# Patient Record
Sex: Male | Born: 1985 | Race: White | Hispanic: No | Marital: Single | Smoking: Never smoker
Health system: Southern US, Community
[De-identification: ages and names within clinical notes are randomized; demographics above are authoritative.]

## PROBLEM LIST (undated history)

## (undated) DIAGNOSIS — G839 Paralytic syndrome, unspecified: Secondary | ICD-10-CM

## (undated) DIAGNOSIS — Z982 Presence of cerebrospinal fluid drainage device: Secondary | ICD-10-CM

## (undated) DIAGNOSIS — R531 Weakness: Secondary | ICD-10-CM

## (undated) DIAGNOSIS — R29818 Other symptoms and signs involving the nervous system: Secondary | ICD-10-CM

## (undated) DIAGNOSIS — G935 Compression of brain: Secondary | ICD-10-CM

## (undated) DIAGNOSIS — R1310 Dysphagia, unspecified: Secondary | ICD-10-CM

## (undated) DIAGNOSIS — I10 Essential (primary) hypertension: Secondary | ICD-10-CM

## (undated) DIAGNOSIS — I6330 Cerebral infarction due to thrombosis of unspecified cerebral artery: Secondary | ICD-10-CM

## (undated) DIAGNOSIS — B181 Chronic viral hepatitis B without delta-agent: Secondary | ICD-10-CM

## (undated) DIAGNOSIS — S069X9A Unspecified intracranial injury with loss of consciousness of unspecified duration, initial encounter: Secondary | ICD-10-CM

## (undated) DIAGNOSIS — I639 Cerebral infarction, unspecified: Secondary | ICD-10-CM

## (undated) DIAGNOSIS — G8110 Spastic hemiplegia affecting unspecified side: Secondary | ICD-10-CM

## (undated) DIAGNOSIS — H539 Unspecified visual disturbance: Secondary | ICD-10-CM

## (undated) DIAGNOSIS — S069XAA Unspecified intracranial injury with loss of consciousness status unknown, initial encounter: Secondary | ICD-10-CM

## (undated) HISTORY — DX: Spastic hemiplegia affecting unspecified side: G81.10

## (undated) HISTORY — DX: Compression of brain: G93.5

## (undated) HISTORY — PX: OTHER SURGICAL HISTORY: SHX169

## (undated) HISTORY — DX: Presence of cerebrospinal fluid drainage device: Z98.2

## (undated) HISTORY — DX: Paralytic syndrome, unspecified: G83.9

## (undated) HISTORY — DX: Unspecified intracranial injury with loss of consciousness status unknown, initial encounter: S06.9XAA

## (undated) HISTORY — DX: Chronic viral hepatitis B without delta-agent: B18.1

## (undated) HISTORY — DX: Other symptoms and signs involving the nervous system: R29.818

## (undated) HISTORY — DX: Cerebral infarction, unspecified: I63.9

## (undated) HISTORY — DX: Dysphagia, unspecified: R13.10

## (undated) HISTORY — PX: VENTRICULOPERITONEAL SHUNT: SHX204

## (undated) HISTORY — DX: Essential (primary) hypertension: I10

## (undated) HISTORY — DX: Cerebral infarction due to thrombosis of unspecified cerebral artery: I63.30

## (undated) HISTORY — DX: Unspecified intracranial injury with loss of consciousness of unspecified duration, initial encounter: S06.9X9A

## (undated) HISTORY — PX: REMOVAL OF GASTROSTOMY TUBE: SHX6058

## (undated) HISTORY — DX: Weakness: R53.1

## (undated) HISTORY — PX: CRANIOPLASTY: SUR330

## (undated) HISTORY — DX: Unspecified visual disturbance: H53.9

---
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
## 2006-10-19 ENCOUNTER — Ambulatory Visit: Payer: Self-pay | Admitting: Internal Medicine

## 2006-11-02 ENCOUNTER — Ambulatory Visit: Payer: Self-pay | Admitting: Internal Medicine

## 2007-06-28 ENCOUNTER — Ambulatory Visit: Payer: Self-pay | Admitting: Internal Medicine

## 2007-06-28 DIAGNOSIS — L299 Pruritus, unspecified: Secondary | ICD-10-CM | POA: Insufficient documentation

## 2007-06-28 DIAGNOSIS — B181 Chronic viral hepatitis B without delta-agent: Secondary | ICD-10-CM | POA: Diagnosis present

## 2007-06-28 LAB — CONVERTED CEMR LAB
ALT: 41 units/L (ref 0–53)
AST: 38 units/L — ABNORMAL HIGH (ref 0–37)
Albumin: 4.1 g/dL (ref 3.5–5.2)
Total Protein: 7.5 g/dL (ref 6.0–8.3)

## 2007-07-05 ENCOUNTER — Telehealth (INDEPENDENT_AMBULATORY_CARE_PROVIDER_SITE_OTHER): Payer: Self-pay | Admitting: *Deleted

## 2007-08-18 ENCOUNTER — Ambulatory Visit: Payer: Self-pay | Admitting: Internal Medicine

## 2007-08-18 DIAGNOSIS — L240 Irritant contact dermatitis due to detergents: Secondary | ICD-10-CM

## 2007-08-18 LAB — CONVERTED CEMR LAB
Albumin: 4.1 g/dL (ref 3.5–5.2)
Alkaline Phosphatase: 50 units/L (ref 39–117)
Total Protein: 7.4 g/dL (ref 6.0–8.3)

## 2007-10-21 ENCOUNTER — Telehealth: Payer: Self-pay | Admitting: Internal Medicine

## 2007-10-27 ENCOUNTER — Ambulatory Visit: Payer: Self-pay | Admitting: Internal Medicine

## 2007-11-23 ENCOUNTER — Telehealth: Payer: Self-pay | Admitting: Internal Medicine

## 2007-12-08 ENCOUNTER — Ambulatory Visit: Payer: Self-pay | Admitting: Internal Medicine

## 2007-12-08 LAB — CONVERTED CEMR LAB
Alkaline Phosphatase: 47 units/L (ref 39–117)
Bilirubin, Direct: 0.1 mg/dL (ref 0.0–0.3)
Indirect Bilirubin: 0.3 mg/dL (ref 0.0–0.9)

## 2007-12-09 ENCOUNTER — Telehealth: Payer: Self-pay | Admitting: Internal Medicine

## 2008-03-09 ENCOUNTER — Telehealth: Payer: Self-pay | Admitting: Internal Medicine

## 2008-05-31 ENCOUNTER — Encounter: Payer: Self-pay | Admitting: Internal Medicine

## 2008-07-29 ENCOUNTER — Emergency Department (HOSPITAL_COMMUNITY): Admission: EM | Admit: 2008-07-29 | Discharge: 2008-07-29 | Payer: Self-pay | Admitting: *Deleted

## 2008-07-30 ENCOUNTER — Encounter: Payer: Self-pay | Admitting: Internal Medicine

## 2008-08-07 ENCOUNTER — Telehealth: Payer: Self-pay | Admitting: Internal Medicine

## 2008-08-09 ENCOUNTER — Ambulatory Visit: Payer: Self-pay | Admitting: Internal Medicine

## 2008-08-09 ENCOUNTER — Telehealth: Payer: Self-pay | Admitting: Internal Medicine

## 2008-08-09 DIAGNOSIS — K5289 Other specified noninfective gastroenteritis and colitis: Secondary | ICD-10-CM

## 2008-10-10 ENCOUNTER — Telehealth: Payer: Self-pay | Admitting: Internal Medicine

## 2008-10-10 ENCOUNTER — Ambulatory Visit: Payer: Self-pay | Admitting: Family Medicine

## 2008-10-25 ENCOUNTER — Ambulatory Visit: Payer: Self-pay | Admitting: Family Medicine

## 2008-10-25 DIAGNOSIS — J309 Allergic rhinitis, unspecified: Secondary | ICD-10-CM | POA: Insufficient documentation

## 2009-11-23 ENCOUNTER — Inpatient Hospital Stay (HOSPITAL_COMMUNITY): Admission: AC | Admit: 2009-11-23 | Discharge: 2009-12-13 | Payer: Self-pay

## 2009-12-10 ENCOUNTER — Ambulatory Visit: Payer: Self-pay | Admitting: Physical Medicine & Rehabilitation

## 2009-12-13 ENCOUNTER — Ambulatory Visit: Payer: Self-pay | Admitting: Physical Medicine & Rehabilitation

## 2009-12-13 ENCOUNTER — Inpatient Hospital Stay (HOSPITAL_COMMUNITY)
Admission: EM | Admit: 2009-12-13 | Discharge: 2010-01-10 | Payer: Self-pay | Admitting: Physical Medicine & Rehabilitation

## 2009-12-28 ENCOUNTER — Ambulatory Visit: Payer: Self-pay | Admitting: Physical Medicine & Rehabilitation

## 2010-01-07 ENCOUNTER — Ambulatory Visit: Payer: Self-pay | Admitting: Psychology

## 2010-01-10 ENCOUNTER — Inpatient Hospital Stay (HOSPITAL_COMMUNITY): Admission: RE | Admit: 2010-01-10 | Discharge: 2010-01-22 | Payer: Self-pay | Admitting: Neurosurgery

## 2010-02-17 DEATH — deceased

## 2010-06-30 ENCOUNTER — Telehealth: Payer: Self-pay | Admitting: Internal Medicine

## 2010-07-01 ENCOUNTER — Telehealth: Payer: Self-pay | Admitting: Internal Medicine

## 2010-07-02 ENCOUNTER — Encounter: Payer: Self-pay | Admitting: Internal Medicine

## 2010-07-02 ENCOUNTER — Telehealth: Payer: Self-pay | Admitting: Internal Medicine

## 2010-07-02 DIAGNOSIS — Z87820 Personal history of traumatic brain injury: Secondary | ICD-10-CM

## 2010-07-09 ENCOUNTER — Telehealth: Payer: Self-pay | Admitting: Internal Medicine

## 2010-07-15 ENCOUNTER — Telehealth: Payer: Self-pay | Admitting: Internal Medicine

## 2010-07-16 ENCOUNTER — Telehealth: Payer: Self-pay | Admitting: Internal Medicine

## 2010-07-22 ENCOUNTER — Encounter: Payer: Self-pay | Admitting: Internal Medicine

## 2010-07-23 ENCOUNTER — Encounter: Payer: Self-pay | Admitting: Internal Medicine

## 2010-07-28 ENCOUNTER — Ambulatory Visit
Admission: RE | Admit: 2010-07-28 | Discharge: 2010-07-28 | Payer: Self-pay | Source: Home / Self Care | Attending: Internal Medicine | Admitting: Internal Medicine

## 2010-07-28 DIAGNOSIS — G8190 Hemiplegia, unspecified affecting unspecified side: Secondary | ICD-10-CM | POA: Insufficient documentation

## 2010-07-28 DIAGNOSIS — I619 Nontraumatic intracerebral hemorrhage, unspecified: Secondary | ICD-10-CM | POA: Insufficient documentation

## 2010-07-28 DIAGNOSIS — G911 Obstructive hydrocephalus: Secondary | ICD-10-CM | POA: Insufficient documentation

## 2010-08-01 ENCOUNTER — Encounter: Payer: Self-pay | Admitting: Internal Medicine

## 2010-08-07 ENCOUNTER — Telehealth: Payer: Self-pay | Admitting: Internal Medicine

## 2010-08-10 ENCOUNTER — Encounter: Payer: Self-pay | Admitting: Physical Medicine & Rehabilitation

## 2010-08-11 ENCOUNTER — Encounter: Payer: Self-pay | Admitting: Internal Medicine

## 2010-08-15 ENCOUNTER — Encounter: Payer: Self-pay | Admitting: Internal Medicine

## 2010-08-21 NOTE — Progress Notes (Signed)
Summary: PT  Phone Note From Other Clinic   Summary of Call: PT needs extenstion of PT, please for two more weeks. [phone] Initial call taken by: Lynann Beaver CMA AAMA,  July 16, 2010 4:34 PM  Follow-up for Phone Call        may extend Follow-up by: Stacie Glaze MD,  July 18, 2010 2:04 PM  Additional Follow-up for Phone Call Additional follow up Details #1::        done Additional Follow-up by: [facility] CMA AAMA,  July 18, 2010 2:34 PM

## 2010-08-21 NOTE — Letter (Signed)
Summary: Statement of Medical Necessity / [facility]  Statement of Medical Necessity / [facility]   Imported By: Lennie Odor 07/08/2010 11:56:46  _____________________________________________________________________  External Attachment:    Type:   Image     Comment:   External Document

## 2010-08-21 NOTE — Progress Notes (Signed)
Summary: Call A Nurse  Phone Note Call from Patient   Summary of Call: called a nd spoke with kei th is am and she is going to check and make sure it was ordered Initial call taken by: Willy Eddy, LPN,  July 02, 2010 9:15 AM     Call-A-Nurse Triage Call Report Triage Record Num: [id number] Operator: Vernell Barrier Patient Name: Paul Kane Call Date & Time: 07/01/2010 6:40:43PM Patient Phone: [phone] PCP: Darryll Capers Patient Gender: Male PCP Fax : [phone] Patient DOB: 01-04-1986 Practice Name: [facility] Reason for Call: Sister calling regarding oral suction kit missing from order. Pt was d[facility] from [facility], Dr. Lovell Sheehan ordered [facility] to bring out tubal feeding supplies....oral suction kit missing from the order. [facility] called [phone] and oral suction kit ordered. Protocol(s) Used: Office Note Recommended Outcome per Protocol: Information Noted and Sent to Office Reason for Outcome: Caller information to office Care Advice:  ~ 07/01/2010 6:55:41PM Page 1 of 1 [id number]

## 2010-08-21 NOTE — Progress Notes (Signed)
Summary: REQUEST FOR RETURN CALL  Phone Note From Other Clinic   Caller: [facility] w/ [facility]  [phone] Summary of Call: Called to discuss patient.... advised she has been working with Rushie Goltz, LPN ref pt care.... Can be reached at  [phone].  Initial call taken by: Debbra Riding,  June 30, 2010 3:36 PM  Follow-up for Phone Call        ov for dec 20 for note Follow-up by: Willy Eddy, LPN,  July 01, 2010 10:54 AM     Appended Document: REQUEST FOR RETURN CALL follow up phone note was printed in error- please disregard- will call [facility] and find out wh at orders are needed

## 2010-08-21 NOTE — Progress Notes (Signed)
Summary: REQUEST FOR RETURN CALL  Phone Note Call from Patient   Caller: Patient   [phone] Complaint: Urinary/GYN Problems Summary of Call: Pts sister called to see if [facility] / Dr Lovell Sheehan had received any paperwork / correspondence from Dr Roby Lofts in [facility] in [place].Marland KitchenMarland KitchenMarland KitchenAdvised that they contacted [facility] and they advised the family that they have not received any info on pt.....  Pts family would like a return call to discuss same...Marland KitchenMarland Kitchen  [phone]. Initial call taken by: Debbra Riding,  July 01, 2010 4:17 PM  Follow-up for Phone Call        talked with sister- told her all orders have been given to [facility] and if she has any problems, to call kei at [facility] Follow-up by: Willy Eddy, LPN,  July 01, 2010 4:23 PM     Appended Document: Orders Update    Clinical Lists Changes  Problems: Added new problem of PERSONAL HISTORY OF TRAUMATIC BRAIN INJURY (ICD-V15.52) Orders: Added new Referral order of DME Referral (DME) - Signed

## 2010-08-21 NOTE — Medication Information (Signed)
Summary: Order for Suction Supplies/[facility]  Order for Suction Supplies/[facility]   Imported By: Maryln Gottron 07/30/2010 14:12:33  _____________________________________________________________________  External Attachment:    Type:   Image     Comment:   External Document

## 2010-08-21 NOTE — Progress Notes (Signed)
Summary: Pt req scripts for DocQlace and Senna  Phone Note Call from Patient Call back at [phone] [place] cell   Caller: father - Chinita Pester Summary of Call: Pts father came by and said that pt had gotten some meds at the [facility] in [place], [place] and the meds need to be refilled. Pt req refill of DocQlace Liquid  take 20ml (200mg ) by mouth two times a day qty 600 Doc-Q-Lace 150mg /23ml Liq Qua  also pt needs refill of Senna-Gen NF Tablet GLD #60 take 2 tabs by mouth every evening at 8pm.   [facility]s [facility] [address]. This has to be called in today asap. Pt out of med. Pls call father when this has been called in.  Initial call taken by: Lucy Antigua,  July 15, 2010 2:39 PM  Follow-up for Phone Call        call in one month supply of both  Follow-up by: Nelwyn Salisbury MD,  July 15, 2010 3:25 PM  Additional Follow-up for Phone Call Additional follow up Details #1::        father aware rx faxed to [facility] - also docqlace not availb in liquid - pill form ok per him. Change to med list done and rx sent KIK Additional Follow-up by: Duard Brady LPN,  July 15, 2010 3:56 PM    New/Updated Medications: SENNA-GEN 8.6 MG TABS (SENNOSIDES) 2 by mouth qhs DOCQLACE 100 MG CAPS (DOCUSATE SODIUM) 1 by mouth bid Prescriptions: DOCQLACE 100 MG CAPS (DOCUSATE SODIUM) 1 by mouth bid  #60 x 0   Entered by:   Duard Brady LPN   Authorized by:   Nelwyn Salisbury MD   Signed by:   Duard Brady LPN on 09/81/1914   Method used:   [facility] ...       [facility]  [phone]* (retail)       [address] [place], [place]  [address]       Ph: [phone] or [phone]       Fax: [phone]   RxID:   [id number] SENNA-GEN 8.6 MG TABS (SENNOSIDES) 2 by mouth qhs  #60 x 0   Entered by:   Duard Brady LPN   Authorized by:   Stacie Glaze MD   Signed by:   Duard Brady LPN on 56/43/3295   Method used:     RxID:   [id number]

## 2010-08-21 NOTE — Miscellaneous (Signed)
Summary: Physician's Orders/[facility]  Physician's Orders/[facility]   Imported By: Maryln Gottron 08/05/2010 09:20:45  _____________________________________________________________________  External Attachment:    Type:   Image     Comment:   External Document
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
## 2010-08-21 NOTE — Progress Notes (Signed)
  Phone Note Call from Patient   Caller: Dad Call For: Stacie Glaze MD Summary of Call: Family would like a referral to Resp. Therapy. [phone] Initial call taken by: [facility] CMA AAMA,  July 09, 2010 10:54 AM

## 2010-08-21 NOTE — Miscellaneous (Signed)
Summary: Certification and Plan of Care/[facility]  Certification and Plan of Care/[facility]   Imported By: Maryln Gottron 07/30/2010 14:15:43  _____________________________________________________________________  External Attachment:    Type:   Image     Comment:   External Document

## 2010-08-21 NOTE — Progress Notes (Addendum)
  Phone Note Call from Patient   Caller: Speech Therapist for [facility] Call For: Stacie Glaze MD Summary of Call: Asking how the referral for out paient referral is going.  They will be out of orders next week. [phone]  Boneta Lucks  Initial call taken by: Lynann Beaver CMA AAMA,  August 07, 2010 11:00 AM  Follow-up for Phone Call        will need to set up out pt speech therapy, ot, and pt - needs more equipment that home health can provide Follow-up by: Willy Eddy, LPN,  August 07, 2010 1:04 PM     Appended Document: Orders Update    Clinical Lists Changes  Orders: Added new Referral order of Rehabilitation Referral (Rehab) - Signed

## 2010-08-21 NOTE — Miscellaneous (Signed)
Summary: Physician's Orders/[facility]  Physician's Orders/[facility]   Imported By: Maryln Gottron 07/30/2010 14:13:58  _____________________________________________________________________  External Attachment:    Type:   Image     Comment:   External Document

## 2010-08-27 NOTE — Letter (Signed)
Summary: [facility]  [facility]   Imported By: Maryln Gottron 08/19/2010 15:55:43  _____________________________________________________________________  External Attachment:    Type:   Image     Comment:   External Document

## 2010-08-27 NOTE — Miscellaneous (Signed)
Summary: Physician's Orders/[facility]  Physician's Orders/[facility]   Imported By: Maryln Gottron 08/21/2010 12:36:21  _____________________________________________________________________  External Attachment:    Type:   Image     Comment:   External Document

## 2010-09-09 ENCOUNTER — Ambulatory Visit: Payer: BC Managed Care – PPO

## 2010-09-09 ENCOUNTER — Ambulatory Visit: Payer: BC Managed Care – PPO | Attending: Internal Medicine | Admitting: Physical Therapy

## 2010-09-09 DIAGNOSIS — R41842 Visuospatial deficit: Secondary | ICD-10-CM | POA: Insufficient documentation

## 2010-09-09 DIAGNOSIS — R4189 Other symptoms and signs involving cognitive functions and awareness: Secondary | ICD-10-CM | POA: Insufficient documentation

## 2010-09-09 DIAGNOSIS — R1313 Dysphagia, pharyngeal phase: Secondary | ICD-10-CM | POA: Insufficient documentation

## 2010-09-09 DIAGNOSIS — M25669 Stiffness of unspecified knee, not elsewhere classified: Secondary | ICD-10-CM | POA: Insufficient documentation

## 2010-09-09 DIAGNOSIS — R4701 Aphasia: Secondary | ICD-10-CM | POA: Insufficient documentation

## 2010-09-09 DIAGNOSIS — M2560 Stiffness of unspecified joint, not elsewhere classified: Secondary | ICD-10-CM | POA: Insufficient documentation

## 2010-09-09 DIAGNOSIS — M2420 Disorder of ligament, unspecified site: Secondary | ICD-10-CM | POA: Insufficient documentation

## 2010-09-09 DIAGNOSIS — M629 Disorder of muscle, unspecified: Secondary | ICD-10-CM | POA: Insufficient documentation

## 2010-09-09 DIAGNOSIS — Z5189 Encounter for other specified aftercare: Secondary | ICD-10-CM | POA: Insufficient documentation

## 2010-09-09 DIAGNOSIS — R1311 Dysphagia, oral phase: Secondary | ICD-10-CM | POA: Insufficient documentation

## 2010-09-09 DIAGNOSIS — R262 Difficulty in walking, not elsewhere classified: Secondary | ICD-10-CM | POA: Insufficient documentation

## 2010-09-09 DIAGNOSIS — M6281 Muscle weakness (generalized): Secondary | ICD-10-CM | POA: Insufficient documentation

## 2010-09-11 ENCOUNTER — Ambulatory Visit: Payer: Self-pay | Admitting: Physical Therapy

## 2010-09-11 ENCOUNTER — Ambulatory Visit: Payer: BC Managed Care – PPO | Admitting: Occupational Therapy

## 2010-09-11 ENCOUNTER — Ambulatory Visit: Payer: BC Managed Care – PPO

## 2010-09-16 ENCOUNTER — Encounter: Payer: BC Managed Care – PPO | Attending: Physical Medicine & Rehabilitation

## 2010-09-16 ENCOUNTER — Ambulatory Visit: Payer: BC Managed Care – PPO | Admitting: Physical Medicine & Rehabilitation

## 2010-09-16 DIAGNOSIS — S069XAS Unspecified intracranial injury with loss of consciousness status unknown, sequela: Secondary | ICD-10-CM | POA: Insufficient documentation

## 2010-09-16 DIAGNOSIS — Y831 Surgical operation with implant of artificial internal device as the cause of abnormal reaction of the patient, or of later complication, without mention of misadventure at the time of the procedure: Secondary | ICD-10-CM | POA: Insufficient documentation

## 2010-09-16 DIAGNOSIS — S069X9S Unspecified intracranial injury with loss of consciousness of unspecified duration, sequela: Secondary | ICD-10-CM | POA: Insufficient documentation

## 2010-09-16 DIAGNOSIS — T85695A Other mechanical complication of other nervous system device, implant or graft, initial encounter: Secondary | ICD-10-CM | POA: Insufficient documentation

## 2010-09-16 DIAGNOSIS — G911 Obstructive hydrocephalus: Secondary | ICD-10-CM | POA: Insufficient documentation

## 2010-09-16 DIAGNOSIS — R259 Unspecified abnormal involuntary movements: Secondary | ICD-10-CM | POA: Insufficient documentation

## 2010-09-16 DIAGNOSIS — I69998 Other sequelae following unspecified cerebrovascular disease: Secondary | ICD-10-CM | POA: Insufficient documentation

## 2010-09-16 DIAGNOSIS — Z79899 Other long term (current) drug therapy: Secondary | ICD-10-CM | POA: Insufficient documentation

## 2010-09-18 ENCOUNTER — Ambulatory Visit: Payer: BC Managed Care – PPO | Admitting: Occupational Therapy

## 2010-09-19 ENCOUNTER — Encounter: Payer: BC Managed Care – PPO | Admitting: Occupational Therapy

## 2010-09-19 ENCOUNTER — Encounter: Payer: Self-pay | Admitting: Internal Medicine

## 2010-09-22 ENCOUNTER — Ambulatory Visit: Payer: BC Managed Care – PPO | Admitting: Internal Medicine

## 2010-09-22 VITALS — BP 120/70 | HR 80 | Resp 14

## 2010-09-22 DIAGNOSIS — J189 Pneumonia, unspecified organism: Secondary | ICD-10-CM

## 2010-09-25 ENCOUNTER — Ambulatory Visit: Payer: BC Managed Care – PPO | Admitting: Physical Therapy

## 2010-09-25 ENCOUNTER — Encounter: Payer: BC Managed Care – PPO | Admitting: Occupational Therapy

## 2010-09-26 ENCOUNTER — Ambulatory Visit: Payer: BC Managed Care – PPO | Admitting: Physical Therapy

## 2010-09-26 ENCOUNTER — Encounter: Payer: BC Managed Care – PPO | Admitting: Occupational Therapy

## 2010-09-30 ENCOUNTER — Ambulatory Visit: Payer: BC Managed Care – PPO | Admitting: Physical Therapy

## 2010-09-30 ENCOUNTER — Encounter: Payer: BC Managed Care – PPO | Admitting: Occupational Therapy

## 2010-09-30 ENCOUNTER — Ambulatory Visit (HOSPITAL_COMMUNITY)
Admission: RE | Admit: 2010-09-30 | Discharge: 2010-09-30 | Disposition: A | Discharge status: Home / Self Care | Payer: BC Managed Care – PPO | Source: Ambulatory Visit | Attending: Internal Medicine | Admitting: Internal Medicine

## 2010-09-30 ENCOUNTER — Other Ambulatory Visit (INDEPENDENT_AMBULATORY_CARE_PROVIDER_SITE_OTHER): Payer: BC Managed Care – PPO | Admitting: Internal Medicine

## 2010-09-30 ENCOUNTER — Ambulatory Visit: Payer: Self-pay | Admitting: Internal Medicine

## 2010-09-30 DIAGNOSIS — Z09 Encounter for follow-up examination after completed treatment for conditions other than malignant neoplasm: Secondary | ICD-10-CM | POA: Insufficient documentation

## 2010-09-30 DIAGNOSIS — M899 Disorder of bone, unspecified: Secondary | ICD-10-CM | POA: Insufficient documentation

## 2010-09-30 DIAGNOSIS — J984 Other disorders of lung: Secondary | ICD-10-CM | POA: Insufficient documentation

## 2010-09-30 DIAGNOSIS — D649 Anemia, unspecified: Secondary | ICD-10-CM

## 2010-09-30 DIAGNOSIS — Z982 Presence of cerebrospinal fluid drainage device: Secondary | ICD-10-CM | POA: Insufficient documentation

## 2010-09-30 DIAGNOSIS — M949 Disorder of cartilage, unspecified: Secondary | ICD-10-CM | POA: Insufficient documentation

## 2010-09-30 DIAGNOSIS — J189 Pneumonia, unspecified organism: Secondary | ICD-10-CM

## 2010-09-30 LAB — CBC WITH DIFFERENTIAL/PLATELET
Basophils Relative: 0.3 % (ref 0.0–3.0)
Eosinophils Absolute: 0.1 10*3/uL (ref 0.0–0.7)
HCT: 38.2 % — ABNORMAL LOW (ref 39.0–52.0)
Lymphocytes Relative: 22.1 % (ref 12.0–46.0)
MCHC: 34.5 g/dL (ref 30.0–36.0)
Monocytes Absolute: 0.3 10*3/uL (ref 0.1–1.0)
Monocytes Relative: 5.4 % (ref 3.0–12.0)
Neutro Abs: 4.2 10*3/uL (ref 1.4–7.7)
Platelets: 171 10*3/uL (ref 150.0–400.0)
RBC: 4.18 Mil/uL — ABNORMAL LOW (ref 4.22–5.81)
RDW: 12.9 % (ref 11.5–14.6)
WBC: 6 10*3/uL (ref 4.5–10.5)

## 2010-10-01 ENCOUNTER — Ambulatory Visit: Payer: BC Managed Care – PPO | Admitting: Physical Therapy

## 2010-10-01 ENCOUNTER — Encounter: Payer: BC Managed Care – PPO | Admitting: Speech Pathology

## 2010-10-01 ENCOUNTER — Ambulatory Visit: Payer: BC Managed Care – PPO | Admitting: Rehabilitative and Restorative Service Providers"

## 2010-10-02 ENCOUNTER — Telehealth: Payer: Self-pay | Admitting: *Deleted

## 2010-10-02 DIAGNOSIS — J189 Pneumonia, unspecified organism: Secondary | ICD-10-CM

## 2010-10-02 MED ORDER — MOXIFLOXACIN HCL 400 MG PO TABS: 400.0000 mg | ORAL_TABLET | Freq: Every day | ORAL | Status: AC

## 2010-10-02 NOTE — Telephone Encounter (Signed)
Father called request xray a nd cbc results- resolving pneumonia per dr Lovell Sheehan- father informed- faxed xray and cbc to dr Willy Eddy at [facility]- also per dr Lovell Sheehan- start avelox 400 liquid qd for 10 days and referral pulmonary

## 2010-10-05 LAB — CBC
HCT: 38 % — ABNORMAL LOW (ref 39.0–52.0)
MCHC: 33.7 g/dL (ref 30.0–36.0)
MCV: 92.2 fL (ref 78.0–100.0)
RBC: 4.12 MIL/uL — ABNORMAL LOW (ref 4.22–5.81)

## 2010-10-05 LAB — MRSA PCR SCREENING: MRSA by PCR: NEGATIVE

## 2010-10-05 LAB — BASIC METABOLIC PANEL
BUN: 20 mg/dL (ref 6–23)
CO2: 30 mEq/L (ref 19–32)
Creatinine, Ser: 0.63 mg/dL (ref 0.4–1.5)

## 2010-10-05 LAB — GLUCOSE, CAPILLARY
Glucose-Capillary: 108 mg/dL — ABNORMAL HIGH (ref 70–99)
Glucose-Capillary: 113 mg/dL — ABNORMAL HIGH (ref 70–99)

## 2010-10-06 ENCOUNTER — Encounter: Payer: Self-pay | Admitting: Internal Medicine

## 2010-10-06 ENCOUNTER — Institutional Professional Consult (permissible substitution) (INDEPENDENT_AMBULATORY_CARE_PROVIDER_SITE_OTHER): Payer: BC Managed Care – PPO | Admitting: Internal Medicine

## 2010-10-06 ENCOUNTER — Encounter: Payer: BC Managed Care – PPO | Admitting: Occupational Therapy

## 2010-10-06 ENCOUNTER — Ambulatory Visit: Payer: BC Managed Care – PPO | Admitting: Physical Therapy

## 2010-10-06 DIAGNOSIS — J690 Pneumonitis due to inhalation of food and vomit: Secondary | ICD-10-CM | POA: Insufficient documentation

## 2010-10-06 LAB — BLOOD GAS, ARTERIAL
Acid-Base Excess: 0.7 mmol/L (ref 0.0–2.0)
Acid-Base Excess: 1.5 mmol/L (ref 0.0–2.0)
Acid-base deficit: 0.1 mmol/L (ref 0.0–2.0)
Bicarbonate: 24.6 mEq/L — ABNORMAL HIGH (ref 20.0–24.0)
Bicarbonate: 27.3 mEq/L — ABNORMAL HIGH (ref 20.0–24.0)
Drawn by: 317771
FIO2: 0.4 %
FIO2: 0.5 %
FIO2: 0.5 %
FIO2: 0.5 %
MECHVT: 500 mL
MECHVT: 500 mL
O2 Saturation: 94.4 %
PEEP: 5 cmH2O
Patient temperature: 98.6
Patient temperature: 98.6
RATE: 15 resp/min
RATE: 16 resp/min
TCO2: 25.7 mmol/L (ref 0–100)
TCO2: 26.9 mmol/L (ref 0–100)
TCO2: 28.5 mmol/L (ref 0–100)
pCO2 arterial: 36.3 mmHg (ref 35.0–45.0)
pCO2 arterial: 37.8 mmHg (ref 35.0–45.0)
pCO2 arterial: 40.5 mmHg (ref 35.0–45.0)
pCO2 arterial: 44.4 mmHg (ref 35.0–45.0)
pH, Arterial: 7.443 (ref 7.350–7.450)
pH, Arterial: 7.444 (ref 7.350–7.450)
pO2, Arterial: 103 mmHg — ABNORMAL HIGH (ref 80.0–100.0)
pO2, Arterial: 70.3 mmHg — ABNORMAL LOW (ref 80.0–100.0)
pO2, Arterial: 76.2 mmHg — ABNORMAL LOW (ref 80.0–100.0)
pO2, Arterial: 82.1 mmHg (ref 80.0–100.0)
pO2, Arterial: 94.2 mmHg (ref 80.0–100.0)

## 2010-10-06 LAB — COMPREHENSIVE METABOLIC PANEL
ALT: 91 U/L — ABNORMAL HIGH (ref 0–53)
ALT: 297 U/L — ABNORMAL HIGH (ref 0–53)
AST: 50 U/L — ABNORMAL HIGH (ref 0–37)
Albumin: 3.2 g/dL — ABNORMAL LOW (ref 3.5–5.2)
Alkaline Phosphatase: 67 U/L (ref 39–117)
Alkaline Phosphatase: 86 U/L (ref 39–117)
CO2: 29 mEq/L (ref 19–32)
CO2: 27 mEq/L (ref 19–32)
Calcium: 8.1 mg/dL — ABNORMAL LOW (ref 8.4–10.5)
Chloride: 103 mEq/L (ref 96–112)
GFR calc Af Amer: >60 mL/min (ref >60)
GFR calc non Af Amer: >60 mL/min (ref >60)
GFR calc non Af Amer: >60 mL/min (ref >60)
Glucose, Bld: 126 mg/dL — ABNORMAL HIGH (ref 70–99)
Potassium: 3.8 mEq/L (ref 3.5–5.1)
Sodium: 139 mEq/L (ref 135–145)
Sodium: 141 mEq/L (ref 135–145)
Total Bilirubin: 0.4 mg/dL (ref 0.3–1.2)

## 2010-10-06 LAB — GLUCOSE, CAPILLARY
Glucose-Capillary: 101 mg/dL — ABNORMAL HIGH (ref 70–99)
Glucose-Capillary: 110 mg/dL — ABNORMAL HIGH (ref 70–99)
Glucose-Capillary: 113 mg/dL — ABNORMAL HIGH (ref 70–99)
Glucose-Capillary: 117 mg/dL — ABNORMAL HIGH (ref 70–99)
Glucose-Capillary: 119 mg/dL — ABNORMAL HIGH (ref 70–99)
Glucose-Capillary: 128 mg/dL — ABNORMAL HIGH (ref 70–99)
Glucose-Capillary: 128 mg/dL — ABNORMAL HIGH (ref 70–99)
Glucose-Capillary: 129 mg/dL — ABNORMAL HIGH (ref 70–99)
Glucose-Capillary: 95 mg/dL (ref 70–99)
Glucose-Capillary: 108 mg/dL — ABNORMAL HIGH (ref 70–99)
Glucose-Capillary: 111 mg/dL — ABNORMAL HIGH (ref 70–99)
Glucose-Capillary: 117 mg/dL — ABNORMAL HIGH (ref 70–99)
Glucose-Capillary: 118 mg/dL — ABNORMAL HIGH (ref 70–99)
Glucose-Capillary: 124 mg/dL — ABNORMAL HIGH (ref 70–99)
Glucose-Capillary: 126 mg/dL — ABNORMAL HIGH (ref 70–99)
Glucose-Capillary: 102 mg/dL — ABNORMAL HIGH (ref 70–99)
Glucose-Capillary: 102 mg/dL — ABNORMAL HIGH (ref 70–99)
Glucose-Capillary: 111 mg/dL — ABNORMAL HIGH (ref 70–99)
Glucose-Capillary: 112 mg/dL — ABNORMAL HIGH (ref 70–99)
Glucose-Capillary: 113 mg/dL — ABNORMAL HIGH (ref 70–99)
Glucose-Capillary: 114 mg/dL — ABNORMAL HIGH (ref 70–99)
Glucose-Capillary: 116 mg/dL — ABNORMAL HIGH (ref 70–99)
Glucose-Capillary: 117 mg/dL — ABNORMAL HIGH (ref 70–99)
Glucose-Capillary: 121 mg/dL — ABNORMAL HIGH (ref 70–99)
Glucose-Capillary: 122 mg/dL — ABNORMAL HIGH (ref 70–99)
Glucose-Capillary: 123 mg/dL — ABNORMAL HIGH (ref 70–99)
Glucose-Capillary: 124 mg/dL — ABNORMAL HIGH (ref 70–99)
Glucose-Capillary: 127 mg/dL — ABNORMAL HIGH (ref 70–99)
Glucose-Capillary: 128 mg/dL — ABNORMAL HIGH (ref 70–99)
Glucose-Capillary: 129 mg/dL — ABNORMAL HIGH (ref 70–99)
Glucose-Capillary: 129 mg/dL — ABNORMAL HIGH (ref 70–99)
Glucose-Capillary: 130 mg/dL — ABNORMAL HIGH (ref 70–99)
Glucose-Capillary: 130 mg/dL — ABNORMAL HIGH (ref 70–99)
Glucose-Capillary: 133 mg/dL — ABNORMAL HIGH (ref 70–99)
Glucose-Capillary: 134 mg/dL — ABNORMAL HIGH (ref 70–99)
Glucose-Capillary: 134 mg/dL — ABNORMAL HIGH (ref 70–99)
Glucose-Capillary: 135 mg/dL — ABNORMAL HIGH (ref 70–99)
Glucose-Capillary: 135 mg/dL — ABNORMAL HIGH (ref 70–99)
Glucose-Capillary: 136 mg/dL — ABNORMAL HIGH (ref 70–99)
Glucose-Capillary: 136 mg/dL — ABNORMAL HIGH (ref 70–99)
Glucose-Capillary: 138 mg/dL — ABNORMAL HIGH (ref 70–99)
Glucose-Capillary: 140 mg/dL — ABNORMAL HIGH (ref 70–99)
Glucose-Capillary: 140 mg/dL — ABNORMAL HIGH (ref 70–99)
Glucose-Capillary: 140 mg/dL — ABNORMAL HIGH (ref 70–99)
Glucose-Capillary: 141 mg/dL — ABNORMAL HIGH (ref 70–99)
Glucose-Capillary: 141 mg/dL — ABNORMAL HIGH (ref 70–99)
Glucose-Capillary: 143 mg/dL — ABNORMAL HIGH (ref 70–99)
Glucose-Capillary: 145 mg/dL — ABNORMAL HIGH (ref 70–99)
Glucose-Capillary: 146 mg/dL — ABNORMAL HIGH (ref 70–99)
Glucose-Capillary: 147 mg/dL — ABNORMAL HIGH (ref 70–99)
Glucose-Capillary: 148 mg/dL — ABNORMAL HIGH (ref 70–99)
Glucose-Capillary: 151 mg/dL — ABNORMAL HIGH (ref 70–99)
Glucose-Capillary: 166 mg/dL — ABNORMAL HIGH (ref 70–99)
Glucose-Capillary: 96 mg/dL (ref 70–99)
Glucose-Capillary: 99 mg/dL (ref 70–99)

## 2010-10-06 LAB — URINALYSIS, ROUTINE W REFLEX MICROSCOPIC
Glucose, UA: NEGATIVE mg/dL
Hgb urine dipstick: NEGATIVE
Ketones, ur: NEGATIVE mg/dL
Ketones, ur: NEGATIVE mg/dL
Leukocytes, UA: NEGATIVE
Nitrite: NEGATIVE
Protein, ur: 30 mg/dL — AB
Protein, ur: NEGATIVE mg/dL
Urobilinogen, UA: 1 mg/dL (ref 0.0–1.0)
pH: 5.5 (ref 5.0–8.0)

## 2010-10-06 LAB — BASIC METABOLIC PANEL
BUN: 10 mg/dL (ref 6–23)
BUN: 11 mg/dL (ref 6–23)
BUN: 11 mg/dL (ref 6–23)
BUN: 11 mg/dL (ref 6–23)
BUN: 11 mg/dL (ref 6–23)
BUN: 13 mg/dL (ref 6–23)
BUN: 13 mg/dL (ref 6–23)
BUN: 14 mg/dL (ref 6–23)
BUN: 16 mg/dL (ref 6–23)
BUN: 16 mg/dL (ref 6–23)
BUN: 17 mg/dL (ref 6–23)
BUN: 20 mg/dL (ref 6–23)
BUN: 9 mg/dL (ref 6–23)
CO2: 25 mEq/L (ref 19–32)
CO2: 25 mEq/L (ref 19–32)
CO2: 25 mEq/L (ref 19–32)
CO2: 27 mEq/L (ref 19–32)
CO2: 27 mEq/L (ref 19–32)
CO2: 27 mEq/L (ref 19–32)
CO2: 28 mEq/L (ref 19–32)
CO2: 28 mEq/L (ref 19–32)
Calcium: 9.5 mg/dL (ref 8.4–10.5)
Calcium: 7.7 mg/dL — ABNORMAL LOW (ref 8.4–10.5)
Calcium: 7.9 mg/dL — ABNORMAL LOW (ref 8.4–10.5)
Calcium: 8.1 mg/dL — ABNORMAL LOW (ref 8.4–10.5)
Calcium: 8.2 mg/dL — ABNORMAL LOW (ref 8.4–10.5)
Calcium: 8.2 mg/dL — ABNORMAL LOW (ref 8.4–10.5)
Calcium: 8.3 mg/dL — ABNORMAL LOW (ref 8.4–10.5)
Calcium: 8.3 mg/dL — ABNORMAL LOW (ref 8.4–10.5)
Calcium: 8.4 mg/dL (ref 8.4–10.5)
Calcium: 8.4 mg/dL (ref 8.4–10.5)
Calcium: 8.4 mg/dL (ref 8.4–10.5)
Calcium: 9 mg/dL (ref 8.4–10.5)
Calcium: 9.2 mg/dL (ref 8.4–10.5)
Chloride: 101 mEq/L (ref 96–112)
Chloride: 101 meq/L (ref 96–112)
Chloride: 102 mEq/L (ref 96–112)
Chloride: 102 mEq/L (ref 96–112)
Chloride: 103 mEq/L (ref 96–112)
Chloride: 106 mEq/L (ref 96–112)
Chloride: 95 mEq/L — ABNORMAL LOW (ref 96–112)
Chloride: 97 mEq/L (ref 96–112)
Chloride: 97 mEq/L (ref 96–112)
Chloride: 98 mEq/L (ref 96–112)
Creatinine, Ser: 0.42 mg/dL (ref 0.4–1.5)
Creatinine, Ser: 0.42 mg/dL (ref 0.4–1.5)
Creatinine, Ser: 0.45 mg/dL (ref 0.4–1.5)
Creatinine, Ser: 0.45 mg/dL (ref 0.4–1.5)
Creatinine, Ser: 0.45 mg/dL (ref 0.4–1.5)
Creatinine, Ser: 0.47 mg/dL (ref 0.4–1.5)
Creatinine, Ser: 0.48 mg/dL (ref 0.4–1.5)
Creatinine, Ser: 0.48 mg/dL (ref 0.4–1.5)
Creatinine, Ser: 0.5 mg/dL (ref 0.4–1.5)
Creatinine, Ser: 0.5 mg/dL (ref 0.4–1.5)
Creatinine, Ser: 0.52 mg/dL (ref 0.4–1.5)
Creatinine, Ser: 0.53 mg/dL (ref 0.4–1.5)
Creatinine, Ser: 0.53 mg/dL (ref 0.4–1.5)
Creatinine, Ser: 0.54 mg/dL (ref 0.4–1.5)
Creatinine, Ser: 0.55 mg/dL (ref 0.4–1.5)
Creatinine, Ser: 0.59 mg/dL (ref 0.4–1.5)
Creatinine, Ser: 0.62 mg/dL (ref 0.4–1.5)
GFR calc Af Amer: >60 mL/min (ref >60)
GFR calc Af Amer: >60 mL/min (ref >60)
GFR calc Af Amer: >60 mL/min (ref >60)
GFR calc Af Amer: >60 mL/min (ref >60)
GFR calc Af Amer: >60 mL/min (ref >60)
GFR calc Af Amer: >60 mL/min (ref >60)
GFR calc Af Amer: >60 mL/min (ref >60)
GFR calc Af Amer: >60 mL/min (ref >60)
GFR calc Af Amer: >60 mL/min (ref >60)
GFR calc Af Amer: >60 mL/min (ref >60)
GFR calc Af Amer: >60 mL/min (ref >60)
GFR calc non Af Amer: >60 mL/min (ref >60)
GFR calc non Af Amer: >60 mL/min (ref >60)
GFR calc non Af Amer: >60 mL/min (ref >60)
GFR calc non Af Amer: >60 mL/min (ref >60)
GFR calc non Af Amer: >60 mL/min (ref >60)
GFR calc non Af Amer: >60 mL/min (ref >60)
GFR calc non Af Amer: >60 mL/min (ref >60)
GFR calc non Af Amer: >60 mL/min (ref >60)
GFR calc non Af Amer: >60 mL/min (ref >60)
GFR calc non Af Amer: >60 mL/min (ref >60)
GFR calc non Af Amer: >60 mL/min (ref >60)
GFR calc non Af Amer: >60 mL/min (ref >60)
GFR calc non Af Amer: >60 mL/min (ref >60)
GFR calc non Af Amer: >60 mL/min (ref >60)
Glucose, Bld: 118 mg/dL — ABNORMAL HIGH (ref 70–99)
Glucose, Bld: 123 mg/dL — ABNORMAL HIGH (ref 70–99)
Glucose, Bld: 124 mg/dL — ABNORMAL HIGH (ref 70–99)
Glucose, Bld: 125 mg/dL — ABNORMAL HIGH (ref 70–99)
Glucose, Bld: 136 mg/dL — ABNORMAL HIGH (ref 70–99)
Glucose, Bld: 138 mg/dL — ABNORMAL HIGH (ref 70–99)
Glucose, Bld: 141 mg/dL — ABNORMAL HIGH (ref 70–99)
Glucose, Bld: 144 mg/dL — ABNORMAL HIGH (ref 70–99)
Glucose, Bld: 145 mg/dL — ABNORMAL HIGH (ref 70–99)
Glucose, Bld: 146 mg/dL — ABNORMAL HIGH (ref 70–99)
Glucose, Bld: 149 mg/dL — ABNORMAL HIGH (ref 70–99)
Potassium: 3.4 meq/L — ABNORMAL LOW (ref 3.5–5.1)
Potassium: 3.6 mEq/L (ref 3.5–5.1)
Potassium: 3.7 mEq/L (ref 3.5–5.1)
Potassium: 3.8 mEq/L (ref 3.5–5.1)
Potassium: 3.9 mEq/L (ref 3.5–5.1)
Potassium: 4 mEq/L (ref 3.5–5.1)
Potassium: 4 mEq/L (ref 3.5–5.1)
Sodium: 138 mEq/L (ref 135–145)
Sodium: 129 mEq/L — ABNORMAL LOW (ref 135–145)
Sodium: 130 mEq/L — ABNORMAL LOW (ref 135–145)
Sodium: 131 mEq/L — ABNORMAL LOW (ref 135–145)
Sodium: 132 mEq/L — ABNORMAL LOW (ref 135–145)
Sodium: 133 mEq/L — ABNORMAL LOW (ref 135–145)
Sodium: 134 mEq/L — ABNORMAL LOW (ref 135–145)
Sodium: 134 mEq/L — ABNORMAL LOW (ref 135–145)
Sodium: 136 meq/L (ref 135–145)
Sodium: 137 mEq/L (ref 135–145)
Sodium: 138 mEq/L (ref 135–145)
Sodium: 139 mEq/L (ref 135–145)

## 2010-10-06 LAB — DIFFERENTIAL
Basophils Absolute: 0 10*3/uL (ref 0.0–0.1)
Basophils Absolute: 0.1 10*3/uL (ref 0.0–0.1)
Basophils Relative: 0 % (ref 0–1)
Basophils Relative: 0 % (ref 0–1)
Eosinophils Absolute: 0.3 10*3/uL (ref 0.0–0.7)
Eosinophils Absolute: 0.2 10*3/uL (ref 0.0–0.7)
Eosinophils Absolute: 0.3 10*3/uL (ref 0.0–0.7)
Eosinophils Relative: 2 % (ref 0–5)
Eosinophils Relative: 1 % (ref 0–5)
Lymphocytes Relative: 12 % (ref 12–46)
Lymphocytes Relative: 10 % — ABNORMAL LOW (ref 12–46)
Lymphs Abs: 1.3 10*3/uL (ref 0.7–4.0)
Monocytes Absolute: 1 10*3/uL (ref 0.1–1.0)
Monocytes Absolute: 1 10*3/uL (ref 0.1–1.0)
Monocytes Relative: 6 % (ref 3–12)
Neutrophils Relative %: 85 % — ABNORMAL HIGH (ref 43–77)

## 2010-10-06 LAB — CBC
HCT: 20.5 % — ABNORMAL LOW (ref 39.0–52.0)
HCT: 21.9 % — ABNORMAL LOW (ref 39.0–52.0)
HCT: 22.4 % — ABNORMAL LOW (ref 39.0–52.0)
HCT: 24.2 % — ABNORMAL LOW (ref 39.0–52.0)
HCT: 24.7 % — ABNORMAL LOW (ref 39.0–52.0)
HCT: 25 % — ABNORMAL LOW (ref 39.0–52.0)
HCT: 25.1 % — ABNORMAL LOW (ref 39.0–52.0)
Hemoglobin: 11.1 g/dL — ABNORMAL LOW (ref 13.0–17.0)
Hemoglobin: 7.2 g/dL — ABNORMAL LOW (ref 13.0–17.0)
Hemoglobin: 7.7 g/dL — ABNORMAL LOW (ref 13.0–17.0)
Hemoglobin: 8.7 g/dL — ABNORMAL LOW (ref 13.0–17.0)
Hemoglobin: 8.7 g/dL — ABNORMAL LOW (ref 13.0–17.0)
Hemoglobin: 9.1 g/dL — ABNORMAL LOW (ref 13.0–17.0)
MCHC: 33.7 g/dL (ref 30.0–36.0)
MCHC: 34 g/dL (ref 30.0–36.0)
MCHC: 34.2 g/dL (ref 30.0–36.0)
MCHC: 34.4 g/dL (ref 30.0–36.0)
MCHC: 34.5 g/dL (ref 30.0–36.0)
MCHC: 34.9 g/dL (ref 30.0–36.0)
MCHC: 35.2 g/dL (ref 30.0–36.0)
MCV: 94 fL (ref 78.0–100.0)
MCV: 91.6 fL (ref 78.0–100.0)
MCV: 91.7 fL (ref 78.0–100.0)
MCV: 92.8 fL (ref 78.0–100.0)
MCV: 93.1 fL (ref 78.0–100.0)
MCV: 93.7 fL (ref 78.0–100.0)
MCV: 94 fL (ref 78.0–100.0)
Platelets: 307 10*3/uL (ref 150–400)
Platelets: 419 10*3/uL — ABNORMAL HIGH (ref 150–400)
Platelets: 478 10*3/uL — ABNORMAL HIGH (ref 150–400)
Platelets: 527 10*3/uL — ABNORMAL HIGH (ref 150–400)
Platelets: 565 10*3/uL — ABNORMAL HIGH (ref 150–400)
Platelets: 606 10*3/uL — ABNORMAL HIGH (ref 150–400)
Platelets: 615 10*3/uL — ABNORMAL HIGH (ref 150–400)
Platelets: 650 10*3/uL — ABNORMAL HIGH (ref 150–400)
RBC: 3.55 MIL/uL — ABNORMAL LOW (ref 4.22–5.81)
RBC: 3.28 MIL/uL — ABNORMAL LOW (ref 4.22–5.81)
RBC: 2.23 MIL/uL — ABNORMAL LOW (ref 4.22–5.81)
RBC: 2.7 MIL/uL — ABNORMAL LOW (ref 4.22–5.81)
RBC: 2.74 MIL/uL — ABNORMAL LOW (ref 4.22–5.81)
RBC: 2.92 MIL/uL — ABNORMAL LOW (ref 4.22–5.81)
RDW: 12.7 % (ref 11.5–15.5)
RDW: 13.5 % (ref 11.5–15.5)
RDW: 13.9 % (ref 11.5–15.5)
RDW: 14.3 % (ref 11.5–15.5)
RDW: 14.4 % (ref 11.5–15.5)
RDW: 14.6 % (ref 11.5–15.5)
WBC: 12.5 10*3/uL — ABNORMAL HIGH (ref 4.0–10.5)
WBC: 12.7 10*3/uL — ABNORMAL HIGH (ref 4.0–10.5)
WBC: 16.4 10*3/uL — ABNORMAL HIGH (ref 4.0–10.5)
WBC: 16.8 10*3/uL — ABNORMAL HIGH (ref 4.0–10.5)
WBC: 17.5 10*3/uL — ABNORMAL HIGH (ref 4.0–10.5)
WBC: 25.3 10*3/uL — ABNORMAL HIGH (ref 4.0–10.5)

## 2010-10-06 LAB — OSMOLALITY
Osmolality: 265 mOsm/kg — ABNORMAL LOW (ref 275–300)
Osmolality: 272 mOsm/kg — ABNORMAL LOW (ref 275–300)
Osmolality: 273 mOsm/kg — ABNORMAL LOW (ref 275–300)
Osmolality: 274 mOsm/kg — ABNORMAL LOW (ref 275–300)
Osmolality: 279 mOsm/kg (ref 275–300)
Osmolality: 279 mOsm/kg (ref 275–300)
Osmolality: 281 mOsm/kg (ref 275–300)
Osmolality: 282 mOsm/kg (ref 275–300)
Osmolality: 284 mOsm/kg (ref 275–300)
Osmolality: 285 mOsm/kg (ref 275–300)
Osmolality: 287 mOsm/kg (ref 275–300)
Osmolality: 289 mOsm/kg (ref 275–300)
Osmolality: 295 mOsm/kg (ref 275–300)

## 2010-10-06 LAB — CULTURE, BAL-QUANTITATIVE W GRAM STAIN: Colony Count: 30000

## 2010-10-06 LAB — URINE CULTURE
Culture: NO GROWTH
Culture: NO GROWTH

## 2010-10-06 LAB — VANCOMYCIN, TROUGH: Vancomycin Tr: <5 ug/mL — ABNORMAL LOW (ref 10.0–20.0)

## 2010-10-06 LAB — CULTURE, BLOOD (ROUTINE X 2): Culture: NO GROWTH

## 2010-10-07 LAB — CBC
HCT: 24.4 % — ABNORMAL LOW (ref 39.0–52.0)
HCT: 25.4 % — ABNORMAL LOW (ref 39.0–52.0)
HCT: 30.9 % — ABNORMAL LOW (ref 39.0–52.0)
HCT: 32.8 % — ABNORMAL LOW (ref 39.0–52.0)
HCT: 37.2 % — ABNORMAL LOW (ref 39.0–52.0)
HCT: 40.2 % (ref 39.0–52.0)
Hemoglobin: 11.1 g/dL — ABNORMAL LOW (ref 13.0–17.0)
Hemoglobin: 11.7 g/dL — ABNORMAL LOW (ref 13.0–17.0)
Hemoglobin: 12.9 g/dL — ABNORMAL LOW (ref 13.0–17.0)
Hemoglobin: 8.4 g/dL — ABNORMAL LOW (ref 13.0–17.0)
Hemoglobin: 9.3 g/dL — ABNORMAL LOW (ref 13.0–17.0)
MCHC: 34.6 g/dL (ref 30.0–36.0)
MCHC: 34.9 g/dL (ref 30.0–36.0)
MCHC: 35.5 g/dL (ref 30.0–36.0)
MCV: 92.4 fL (ref 78.0–100.0)
MCV: 92.9 fL (ref 78.0–100.0)
MCV: 92.9 fL (ref 78.0–100.0)
Platelets: 131 10*3/uL — ABNORMAL LOW (ref 150–400)
Platelets: 168 10*3/uL (ref 150–400)
Platelets: 183 10*3/uL (ref 150–400)
Platelets: 266 10*3/uL (ref 150–400)
RBC: 2.65 MIL/uL — ABNORMAL LOW (ref 4.22–5.81)
RBC: 2.71 MIL/uL — ABNORMAL LOW (ref 4.22–5.81)
RBC: 3.56 MIL/uL — ABNORMAL LOW (ref 4.22–5.81)
RBC: 4.33 MIL/uL (ref 4.22–5.81)
RDW: 12.6 % (ref 11.5–15.5)
RDW: 12.8 % (ref 11.5–15.5)
RDW: 12.9 % (ref 11.5–15.5)
RDW: 13.1 % (ref 11.5–15.5)
RDW: 13.3 % (ref 11.5–15.5)
WBC: 11.6 10*3/uL — ABNORMAL HIGH (ref 4.0–10.5)
WBC: 12.2 10*3/uL — ABNORMAL HIGH (ref 4.0–10.5)
WBC: 12.5 10*3/uL — ABNORMAL HIGH (ref 4.0–10.5)
WBC: 13.9 10*3/uL — ABNORMAL HIGH (ref 4.0–10.5)
WBC: 16.6 10*3/uL — ABNORMAL HIGH (ref 4.0–10.5)
WBC: 19.9 10*3/uL — ABNORMAL HIGH (ref 4.0–10.5)
WBC: 25.7 10*3/uL — ABNORMAL HIGH (ref 4.0–10.5)

## 2010-10-07 LAB — OSMOLALITY
Osmolality: 292 mOsm/kg (ref 275–300)
Osmolality: 295 mOsm/kg (ref 275–300)
Osmolality: 296 mOsm/kg (ref 275–300)
Osmolality: 297 mOsm/kg (ref 275–300)
Osmolality: 298 mOsm/kg (ref 275–300)
Osmolality: 298 mOsm/kg (ref 275–300)
Osmolality: 299 mOsm/kg (ref 275–300)
Osmolality: 299 mOsm/kg (ref 275–300)
Osmolality: 300 mOsm/kg (ref 275–300)
Osmolality: 300 mOsm/kg (ref 275–300)
Osmolality: 300 mOsm/kg (ref 275–300)
Osmolality: 303 mOsm/kg — ABNORMAL HIGH (ref 275–300)

## 2010-10-07 LAB — BLOOD GAS, ARTERIAL
Acid-Base Excess: 2.2 mmol/L — ABNORMAL HIGH (ref 0.0–2.0)
Acid-Base Excess: 3.4 mmol/L — ABNORMAL HIGH (ref 0.0–2.0)
Acid-Base Excess: 3.6 mmol/L — ABNORMAL HIGH (ref 0.0–2.0)
Bicarbonate: 18.3 mEq/L — ABNORMAL LOW (ref 20.0–24.0)
Bicarbonate: 23.7 mEq/L (ref 20.0–24.0)
Bicarbonate: 24.9 mEq/L — ABNORMAL HIGH (ref 20.0–24.0)
Bicarbonate: 26.1 mEq/L — ABNORMAL HIGH (ref 20.0–24.0)
Bicarbonate: 26.7 mEq/L — ABNORMAL HIGH (ref 20.0–24.0)
Bicarbonate: 27.3 mEq/L — ABNORMAL HIGH (ref 20.0–24.0)
Drawn by: 296031
Drawn by: 317771
Drawn by: 32463
FIO2: 0.4 %
FIO2: 0.5 %
FIO2: 0.5 %
FIO2: 0.5 %
MECHVT: 450 mL
MECHVT: 450 mL
MECHVT: 450 mL
MECHVT: 450 mL
MECHVT: 500 mL
O2 Saturation: 100 %
O2 Saturation: 90.9 %
O2 Saturation: 98.1 %
O2 Saturation: 98.3 %
O2 Saturation: 98.5 %
O2 Saturation: 98.9 %
O2 Saturation: 99 %
O2 Saturation: 99.1 %
O2 Saturation: 99.2 %
PEEP: 0.8 cmH2O
PEEP: 10 cmH2O
PEEP: 5 cmH2O
PEEP: 5 cmH2O
PEEP: 5 cmH2O
PEEP: 5 cmH2O
PEEP: 5 cmH2O
PEEP: 8 cmH2O
Patient temperature: 100.4
Patient temperature: 100.4
Patient temperature: 100.9
Patient temperature: 101.3
Patient temperature: 98.6
Patient temperature: 98.6
Patient temperature: 98.6
RATE: 15 resp/min
RATE: 15 resp/min
RATE: 15 resp/min
RATE: 15 resp/min
RATE: 15 resp/min
RATE: 16 resp/min
TCO2: 19.2 mmol/L (ref 0–100)
TCO2: 25 mmol/L (ref 0–100)
TCO2: 27.3 mmol/L (ref 0–100)
TCO2: 27.7 mmol/L (ref 0–100)
TCO2: 28.2 mmol/L (ref 0–100)
TCO2: 28.6 mmol/L (ref 0–100)
pCO2 arterial: 39.4 mmHg (ref 35.0–45.0)
pCO2 arterial: 39.6 mmHg (ref 35.0–45.0)
pCO2 arterial: 40 mmHg (ref 35.0–45.0)
pCO2 arterial: 42.5 mmHg (ref 35.0–45.0)
pCO2 arterial: 51.1 mmHg — ABNORMAL HIGH (ref 35.0–45.0)
pH, Arterial: 7.338 — ABNORMAL LOW (ref 7.350–7.450)
pH, Arterial: 7.37 (ref 7.350–7.450)
pH, Arterial: 7.411 (ref 7.350–7.450)
pH, Arterial: 7.413 (ref 7.350–7.450)
pH, Arterial: 7.438 (ref 7.350–7.450)
pO2, Arterial: 101 mmHg — ABNORMAL HIGH (ref 80.0–100.0)
pO2, Arterial: 106 mmHg — ABNORMAL HIGH (ref 80.0–100.0)
pO2, Arterial: 108 mmHg — ABNORMAL HIGH (ref 80.0–100.0)
pO2, Arterial: 123 mmHg — ABNORMAL HIGH (ref 80.0–100.0)
pO2, Arterial: 127 mmHg — ABNORMAL HIGH (ref 80.0–100.0)
pO2, Arterial: 236 mmHg — ABNORMAL HIGH (ref 80.0–100.0)

## 2010-10-07 LAB — BASIC METABOLIC PANEL
BUN: 10 mg/dL (ref 6–23)
BUN: 12 mg/dL (ref 6–23)
BUN: 7 mg/dL (ref 6–23)
BUN: 8 mg/dL (ref 6–23)
CO2: 26 mEq/L (ref 19–32)
CO2: 27 mEq/L (ref 19–32)
CO2: 27 mEq/L (ref 19–32)
CO2: 27 mEq/L (ref 19–32)
CO2: 28 mEq/L (ref 19–32)
CO2: 28 mEq/L (ref 19–32)
CO2: 28 mEq/L (ref 19–32)
CO2: 29 mEq/L (ref 19–32)
Calcium: 8.2 mg/dL — ABNORMAL LOW (ref 8.4–10.5)
Calcium: 8.3 mg/dL — ABNORMAL LOW (ref 8.4–10.5)
Calcium: 8.3 mg/dL — ABNORMAL LOW (ref 8.4–10.5)
Calcium: 8.4 mg/dL (ref 8.4–10.5)
Calcium: 8.4 mg/dL (ref 8.4–10.5)
Calcium: 8.4 mg/dL (ref 8.4–10.5)
Calcium: 8.5 mg/dL (ref 8.4–10.5)
Calcium: 8.5 mg/dL (ref 8.4–10.5)
Calcium: 8.6 mg/dL (ref 8.4–10.5)
Calcium: 8.7 mg/dL (ref 8.4–10.5)
Calcium: 9.1 mg/dL (ref 8.4–10.5)
Chloride: 111 mEq/L (ref 96–112)
Chloride: 112 mEq/L (ref 96–112)
Chloride: 112 mEq/L (ref 96–112)
Chloride: 114 mEq/L — ABNORMAL HIGH (ref 96–112)
Creatinine, Ser: 0.54 mg/dL (ref 0.4–1.5)
Creatinine, Ser: 0.58 mg/dL (ref 0.4–1.5)
Creatinine, Ser: 0.58 mg/dL (ref 0.4–1.5)
Creatinine, Ser: 0.6 mg/dL (ref 0.4–1.5)
Creatinine, Ser: 0.65 mg/dL (ref 0.4–1.5)
Creatinine, Ser: 0.83 mg/dL (ref 0.4–1.5)
GFR calc Af Amer: >60 mL/min (ref >60)
GFR calc Af Amer: >60 mL/min (ref >60)
GFR calc Af Amer: >60 mL/min (ref >60)
GFR calc Af Amer: >60 mL/min (ref >60)
GFR calc Af Amer: >60 mL/min (ref >60)
GFR calc Af Amer: >60 mL/min (ref >60)
GFR calc Af Amer: >60 mL/min (ref >60)
GFR calc Af Amer: >60 mL/min (ref >60)
GFR calc Af Amer: >60 mL/min (ref >60)
GFR calc Af Amer: >60 mL/min (ref >60)
GFR calc Af Amer: >60 mL/min (ref >60)
GFR calc non Af Amer: >60 mL/min (ref >60)
GFR calc non Af Amer: >60 mL/min (ref >60)
GFR calc non Af Amer: >60 mL/min (ref >60)
GFR calc non Af Amer: >60 mL/min (ref >60)
GFR calc non Af Amer: >60 mL/min (ref >60)
GFR calc non Af Amer: >60 mL/min (ref >60)
GFR calc non Af Amer: >60 mL/min (ref >60)
GFR calc non Af Amer: >60 mL/min (ref >60)
GFR calc non Af Amer: >60 mL/min (ref >60)
GFR calc non Af Amer: >60 mL/min (ref >60)
GFR calc non Af Amer: >60 mL/min (ref >60)
GFR calc non Af Amer: >60 mL/min (ref >60)
Glucose, Bld: 116 mg/dL — ABNORMAL HIGH (ref 70–99)
Glucose, Bld: 117 mg/dL — ABNORMAL HIGH (ref 70–99)
Glucose, Bld: 127 mg/dL — ABNORMAL HIGH (ref 70–99)
Glucose, Bld: 137 mg/dL — ABNORMAL HIGH (ref 70–99)
Glucose, Bld: 141 mg/dL — ABNORMAL HIGH (ref 70–99)
Glucose, Bld: 154 mg/dL — ABNORMAL HIGH (ref 70–99)
Glucose, Bld: 162 mg/dL — ABNORMAL HIGH (ref 70–99)
Potassium: 3.2 mEq/L — ABNORMAL LOW (ref 3.5–5.1)
Potassium: 3.2 mEq/L — ABNORMAL LOW (ref 3.5–5.1)
Potassium: 3.2 mEq/L — ABNORMAL LOW (ref 3.5–5.1)
Potassium: 3.4 mEq/L — ABNORMAL LOW (ref 3.5–5.1)
Potassium: 3.5 mEq/L (ref 3.5–5.1)
Potassium: 3.6 mEq/L (ref 3.5–5.1)
Potassium: 3.6 mEq/L (ref 3.5–5.1)
Potassium: 3.7 mEq/L (ref 3.5–5.1)
Potassium: 3.9 mEq/L (ref 3.5–5.1)
Sodium: 141 mEq/L (ref 135–145)
Sodium: 142 mEq/L (ref 135–145)
Sodium: 144 mEq/L (ref 135–145)
Sodium: 144 mEq/L (ref 135–145)
Sodium: 145 mEq/L (ref 135–145)
Sodium: 145 mEq/L (ref 135–145)
Sodium: 146 mEq/L — ABNORMAL HIGH (ref 135–145)
Sodium: 146 mEq/L — ABNORMAL HIGH (ref 135–145)
Sodium: 147 mEq/L — ABNORMAL HIGH (ref 135–145)
Sodium: 147 mEq/L — ABNORMAL HIGH (ref 135–145)
Sodium: 148 mEq/L — ABNORMAL HIGH (ref 135–145)
Sodium: 148 mEq/L — ABNORMAL HIGH (ref 135–145)
Sodium: 148 mEq/L — ABNORMAL HIGH (ref 135–145)
Sodium: 148 mEq/L — ABNORMAL HIGH (ref 135–145)

## 2010-10-07 LAB — COMPREHENSIVE METABOLIC PANEL
ALT: 25 U/L (ref 0–53)
ALT: 56 U/L — ABNORMAL HIGH (ref 0–53)
AST: 130 U/L — ABNORMAL HIGH (ref 0–37)
AST: 138 U/L — ABNORMAL HIGH (ref 0–37)
Albumin: 2.8 g/dL — ABNORMAL LOW (ref 3.5–5.2)
Albumin: 4.3 g/dL (ref 3.5–5.2)
Alkaline Phosphatase: 70 U/L (ref 39–117)
BUN: 11 mg/dL (ref 6–23)
BUN: 7 mg/dL (ref 6–23)
CO2: 20 mEq/L (ref 19–32)
CO2: 29 mEq/L (ref 19–32)
Calcium: 8.5 mg/dL (ref 8.4–10.5)
Calcium: 8.6 mg/dL (ref 8.4–10.5)
Calcium: 8.6 mg/dL (ref 8.4–10.5)
Calcium: 9.2 mg/dL (ref 8.4–10.5)
Chloride: 105 mEq/L (ref 96–112)
Chloride: 111 mEq/L (ref 96–112)
Creatinine, Ser: 0.58 mg/dL (ref 0.4–1.5)
Creatinine, Ser: 1 mg/dL (ref 0.4–1.5)
GFR calc Af Amer: >60 mL/min (ref >60)
GFR calc Af Amer: >60 mL/min (ref >60)
GFR calc non Af Amer: >60 mL/min (ref >60)
Glucose, Bld: 147 mg/dL — ABNORMAL HIGH (ref 70–99)
Glucose, Bld: 154 mg/dL — ABNORMAL HIGH (ref 70–99)
Potassium: 3.3 mEq/L — ABNORMAL LOW (ref 3.5–5.1)
Sodium: 141 meq/L (ref 135–145)
Sodium: 148 mEq/L — ABNORMAL HIGH (ref 135–145)
Total Bilirubin: 0.9 mg/dL (ref 0.3–1.2)
Total Bilirubin: 1.1 mg/dL (ref 0.3–1.2)
Total Protein: 7.8 g/dL (ref 6.0–8.3)

## 2010-10-07 LAB — URINALYSIS, ROUTINE W REFLEX MICROSCOPIC
Bilirubin Urine: NEGATIVE
Ketones, ur: 15 mg/dL — AB
Leukocytes, UA: NEGATIVE
Nitrite: NEGATIVE
Protein, ur: 30 mg/dL — AB
pH: 5.5 (ref 5.0–8.0)

## 2010-10-07 LAB — DIFFERENTIAL
Basophils Absolute: 0 10*3/uL (ref 0.0–0.1)
Basophils Absolute: 0 10*3/uL (ref 0.0–0.1)
Eosinophils Absolute: 0.4 10*3/uL (ref 0.0–0.7)
Eosinophils Relative: 3 % (ref 0–5)
Lymphocytes Relative: 5 % — ABNORMAL LOW (ref 12–46)
Lymphocytes Relative: 5 % — ABNORMAL LOW (ref 12–46)
Lymphocytes Relative: 8 % — ABNORMAL LOW (ref 12–46)
Lymphs Abs: 0.6 10*3/uL — ABNORMAL LOW (ref 0.7–4.0)
Lymphs Abs: 0.7 10*3/uL (ref 0.7–4.0)
Lymphs Abs: 1 10*3/uL (ref 0.7–4.0)
Monocytes Absolute: 0.6 10*3/uL (ref 0.1–1.0)
Monocytes Absolute: 0.8 10*3/uL (ref 0.1–1.0)
Neutro Abs: 10 10*3/uL — ABNORMAL HIGH (ref 1.7–7.7)
Neutro Abs: 10.3 10*3/uL — ABNORMAL HIGH (ref 1.7–7.7)
Neutro Abs: 12.5 10*3/uL — ABNORMAL HIGH (ref 1.7–7.7)
Neutrophils Relative %: 83 % — ABNORMAL HIGH (ref 43–77)

## 2010-10-07 LAB — TYPE AND SCREEN: ABO/RH(D): A POS

## 2010-10-07 LAB — CULTURE, BAL-QUANTITATIVE W GRAM STAIN: Colony Count: >=100000

## 2010-10-07 LAB — POCT I-STAT 7, (LYTES, BLD GAS, ICA,H+H)
Calcium, Ion: 1.11 mmol/L — ABNORMAL LOW (ref 1.12–1.32)
Hemoglobin: 12.9 g/dL — ABNORMAL LOW (ref 13.0–17.0)
O2 Saturation: 100 %
Patient temperature: 35.3
Potassium: 3.6 meq/L (ref 3.5–5.1)
pCO2 arterial: 38.1 mmHg (ref 35.0–45.0)
pH, Arterial: 7.454 — ABNORMAL HIGH (ref 7.350–7.450)
pO2, Arterial: 353 mmHg — ABNORMAL HIGH (ref 80.0–100.0)

## 2010-10-07 LAB — POCT I-STAT, CHEM 8
Calcium, Ion: 1.07 mmol/L — ABNORMAL LOW (ref 1.12–1.32)
Glucose, Bld: 146 mg/dL — ABNORMAL HIGH (ref 70–99)
HCT: 44 % (ref 39.0–52.0)
TCO2: 20 mmol/L (ref 0–100)

## 2010-10-07 LAB — MRSA PCR SCREENING: MRSA by PCR: NEGATIVE

## 2010-10-07 LAB — URINE CULTURE: Colony Count: NO GROWTH

## 2010-10-07 LAB — GLUCOSE, CAPILLARY
Glucose-Capillary: 106 mg/dL — ABNORMAL HIGH (ref 70–99)
Glucose-Capillary: 112 mg/dL — ABNORMAL HIGH (ref 70–99)
Glucose-Capillary: 115 mg/dL — ABNORMAL HIGH (ref 70–99)
Glucose-Capillary: 122 mg/dL — ABNORMAL HIGH (ref 70–99)
Glucose-Capillary: 122 mg/dL — ABNORMAL HIGH (ref 70–99)
Glucose-Capillary: 134 mg/dL — ABNORMAL HIGH (ref 70–99)
Glucose-Capillary: 135 mg/dL — ABNORMAL HIGH (ref 70–99)
Glucose-Capillary: 156 mg/dL — ABNORMAL HIGH (ref 70–99)

## 2010-10-07 LAB — CULTURE, BLOOD (ROUTINE X 2)

## 2010-10-07 LAB — PROTIME-INR
INR: 1.08 (ref 0.00–1.49)
INR: 1.24 (ref 0.00–1.49)
Prothrombin Time: 13.9 seconds (ref 11.6–15.2)
Prothrombin Time: 14 seconds (ref 11.6–15.2)

## 2010-10-07 LAB — APTT
aPTT: 28 seconds (ref 24–37)
aPTT: 30 seconds (ref 24–37)

## 2010-10-07 LAB — ELECTROLYTE PANEL
CO2: 28 mEq/L (ref 19–32)
Chloride: 102 mEq/L (ref 96–112)

## 2010-10-07 LAB — LACTIC ACID, PLASMA: Lactic Acid, Venous: 8.2 mmol/L — ABNORMAL HIGH (ref 0.5–2.2)

## 2010-10-08 ENCOUNTER — Ambulatory Visit: Payer: BC Managed Care – PPO | Admitting: Physical Therapy

## 2010-10-08 ENCOUNTER — Encounter: Payer: BC Managed Care – PPO | Admitting: Occupational Therapy

## 2010-10-13 ENCOUNTER — Encounter: Payer: BC Managed Care – PPO | Admitting: Occupational Therapy

## 2010-10-13 ENCOUNTER — Ambulatory Visit: Payer: BC Managed Care – PPO | Admitting: Physical Therapy

## 2010-10-16 ENCOUNTER — Encounter: Payer: BC Managed Care – PPO | Admitting: Occupational Therapy

## 2010-10-16 ENCOUNTER — Ambulatory Visit: Payer: BC Managed Care – PPO | Admitting: Physical Therapy
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
## 2010-10-17 ENCOUNTER — Ambulatory Visit: Payer: BC Managed Care – PPO | Admitting: Physical Medicine & Rehabilitation

## 2010-10-21 ENCOUNTER — Ambulatory Visit: Payer: BC Managed Care – PPO | Admitting: Physical Therapy

## 2010-10-21 ENCOUNTER — Encounter: Payer: BC Managed Care – PPO | Admitting: Occupational Therapy

## 2010-10-27 ENCOUNTER — Encounter: Payer: BC Managed Care – PPO | Admitting: Occupational Therapy

## 2010-10-27 ENCOUNTER — Ambulatory Visit: Payer: BC Managed Care – PPO | Admitting: Physical Therapy

## 2010-10-30 ENCOUNTER — Ambulatory Visit: Payer: BC Managed Care – PPO | Admitting: Physical Therapy

## 2010-10-30 ENCOUNTER — Encounter: Payer: Self-pay | Admitting: Family Medicine

## 2010-10-30 ENCOUNTER — Encounter: Payer: BC Managed Care – PPO | Admitting: Occupational Therapy

## 2010-10-30 ENCOUNTER — Ambulatory Visit (INDEPENDENT_AMBULATORY_CARE_PROVIDER_SITE_OTHER): Payer: BC Managed Care – PPO | Admitting: Family Medicine

## 2010-10-30 VITALS — Temp 98.8°F

## 2010-10-30 DIAGNOSIS — Z4802 Encounter for removal of sutures: Secondary | ICD-10-CM

## 2010-10-30 DIAGNOSIS — L738 Other specified follicular disorders: Secondary | ICD-10-CM

## 2010-10-30 DIAGNOSIS — L739 Follicular disorder, unspecified: Secondary | ICD-10-CM

## 2010-10-30 MED ORDER — DOXYCYCLINE HYCLATE 100 MG PO TABS: 100.0000 mg | ORAL_TABLET | Freq: Two times a day (BID) | ORAL | Status: AC

## 2010-10-30 NOTE — Progress Notes (Signed)
  Subjective:    Patient ID: Paul Kane, male    DOB: 28-Jul-1985, 25 y.o.   MRN: [id number]  HPI Patient for the following. Recent shunt revision at [facility]. Multiple wounds right side of the scalp and abdominal from his surgery. Surgery was done 13 days ago needs suture removal. Wounds healing well. No drainage. No erythema. No fever or chills. No vomiting. No mental status changes.  Does have separate issue of some pustules and follicular lesions mostly upper anterior chest. These are pruritic. Again no fever.   Review of Systems  Constitutional: Negative for fever and chills.  Respiratory: Negative for cough.        Objective:   Physical Exam Patient is alert but noncommunicative. Chest is clear auscultation Heart regular rhythm and rate Skin exam he has 3 separate wounds right side of scalp these are all healing well. Running sutures are removed without difficulty. No erythema or drainage. He has incision right lower abdominal region which again is healing well with no secondary infection. Sutures all removed without difficulty. Skin exam otherwise reveals some scattered erythematous papules and pustules upper anterior chest and a couple of face.       Assessment & Plan:  #1 recent shunt revision with wounds healing well and sutures are removed with no signs of secondary infection #2 folliculitis upper trunk doxycycline 100 mg twice a day for 10 days. Followup if not resolving in the next several days

## 2010-10-30 NOTE — Patient Instructions (Signed)
Leave steristrips in place until they peel off on their own. Go ahead and clean wounds with soap and water daily.

## 2010-11-03 LAB — URINALYSIS, ROUTINE W REFLEX MICROSCOPIC
Bilirubin Urine: NEGATIVE
Ketones, ur: NEGATIVE mg/dL
Nitrite: NEGATIVE
Urobilinogen, UA: 0.2 mg/dL (ref 0.0–1.0)

## 2010-11-04 ENCOUNTER — Ambulatory Visit: Payer: BC Managed Care – PPO

## 2010-11-04 ENCOUNTER — Ambulatory Visit: Payer: BC Managed Care – PPO | Admitting: Rehabilitative and Restorative Service Providers"

## 2010-11-04 ENCOUNTER — Ambulatory Visit: Payer: BC Managed Care – PPO | Attending: Internal Medicine | Admitting: Occupational Therapy

## 2010-11-04 DIAGNOSIS — R1313 Dysphagia, pharyngeal phase: Secondary | ICD-10-CM | POA: Insufficient documentation

## 2010-11-04 DIAGNOSIS — R41842 Visuospatial deficit: Secondary | ICD-10-CM | POA: Insufficient documentation

## 2010-11-04 DIAGNOSIS — M25669 Stiffness of unspecified knee, not elsewhere classified: Secondary | ICD-10-CM | POA: Insufficient documentation

## 2010-11-04 DIAGNOSIS — Z5189 Encounter for other specified aftercare: Secondary | ICD-10-CM | POA: Insufficient documentation

## 2010-11-04 DIAGNOSIS — M2420 Disorder of ligament, unspecified site: Secondary | ICD-10-CM | POA: Insufficient documentation

## 2010-11-04 DIAGNOSIS — M6281 Muscle weakness (generalized): Secondary | ICD-10-CM | POA: Insufficient documentation

## 2010-11-04 DIAGNOSIS — R4189 Other symptoms and signs involving cognitive functions and awareness: Secondary | ICD-10-CM | POA: Insufficient documentation

## 2010-11-04 DIAGNOSIS — R262 Difficulty in walking, not elsewhere classified: Secondary | ICD-10-CM | POA: Insufficient documentation

## 2010-11-04 DIAGNOSIS — M629 Disorder of muscle, unspecified: Secondary | ICD-10-CM | POA: Insufficient documentation

## 2010-11-04 DIAGNOSIS — M2560 Stiffness of unspecified joint, not elsewhere classified: Secondary | ICD-10-CM | POA: Insufficient documentation

## 2010-11-04 DIAGNOSIS — R1311 Dysphagia, oral phase: Secondary | ICD-10-CM | POA: Insufficient documentation

## 2010-11-04 DIAGNOSIS — R4701 Aphasia: Secondary | ICD-10-CM | POA: Insufficient documentation

## 2010-11-06 ENCOUNTER — Ambulatory Visit: Payer: BC Managed Care – PPO | Admitting: Occupational Therapy

## 2010-11-11 ENCOUNTER — Ambulatory Visit: Payer: BC Managed Care – PPO | Admitting: Physical Therapy

## 2010-11-11 ENCOUNTER — Encounter
Payer: BC Managed Care – PPO | Attending: Physical Medicine & Rehabilitation | Admitting: Physical Medicine & Rehabilitation

## 2010-11-11 ENCOUNTER — Ambulatory Visit: Payer: BC Managed Care – PPO

## 2010-11-11 DIAGNOSIS — R259 Unspecified abnormal involuntary movements: Secondary | ICD-10-CM | POA: Insufficient documentation

## 2010-11-11 DIAGNOSIS — G8110 Spastic hemiplegia affecting unspecified side: Secondary | ICD-10-CM | POA: Insufficient documentation

## 2010-11-11 DIAGNOSIS — S069X9A Unspecified intracranial injury with loss of consciousness of unspecified duration, initial encounter: Secondary | ICD-10-CM

## 2010-11-11 DIAGNOSIS — G808 Other cerebral palsy: Secondary | ICD-10-CM

## 2010-11-11 DIAGNOSIS — X58XXXS Exposure to other specified factors, sequela: Secondary | ICD-10-CM | POA: Insufficient documentation

## 2010-11-11 DIAGNOSIS — F070 Personality change due to known physiological condition: Secondary | ICD-10-CM

## 2010-11-11 DIAGNOSIS — G911 Obstructive hydrocephalus: Secondary | ICD-10-CM | POA: Insufficient documentation

## 2010-11-11 DIAGNOSIS — S069XAS Unspecified intracranial injury with loss of consciousness status unknown, sequela: Secondary | ICD-10-CM | POA: Insufficient documentation

## 2010-11-11 DIAGNOSIS — R1310 Dysphagia, unspecified: Secondary | ICD-10-CM

## 2010-11-11 DIAGNOSIS — S069X9S Unspecified intracranial injury with loss of consciousness of unspecified duration, sequela: Secondary | ICD-10-CM | POA: Insufficient documentation

## 2010-11-12 ENCOUNTER — Ambulatory Visit: Payer: BC Managed Care – PPO | Admitting: Occupational Therapy

## 2010-11-12 ENCOUNTER — Ambulatory Visit: Payer: BC Managed Care – PPO | Admitting: Physical Therapy
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
## 2010-11-13 ENCOUNTER — Ambulatory Visit: Payer: BC Managed Care – PPO | Admitting: Occupational Therapy

## 2010-11-13 ENCOUNTER — Ambulatory Visit: Payer: BC Managed Care – PPO | Admitting: Physical Therapy

## 2010-11-14 ENCOUNTER — Ambulatory Visit (INDEPENDENT_AMBULATORY_CARE_PROVIDER_SITE_OTHER): Payer: BC Managed Care – PPO | Admitting: Family Medicine

## 2010-11-14 ENCOUNTER — Encounter: Payer: Self-pay | Admitting: Family Medicine

## 2010-11-14 VITALS — BP 110/60 | Temp 98.0°F

## 2010-11-14 DIAGNOSIS — R05 Cough: Secondary | ICD-10-CM

## 2010-11-14 NOTE — Patient Instructions (Signed)
Observe for now.  Start antibiotic if he has any fever or increased productive cough.

## 2010-11-14 NOTE — Progress Notes (Signed)
  Subjective:    Patient ID: Paul Kane, male    DOB: [birth year], 25 y.o.   MRN: [id number]  HPI Patient seen with some cough and possibly some increased wheezing past couple days. 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 Had recent shunt revision. He has prior history of previous aspiration pneumonia. Has not had any fever. No respiratory distress. Coughing is relatively mild. Mother did notice some greenish colored mucus on a couple of occasions. He's been alert   Review of Systems  Unable to perform ROS Constitutional: Negative for fever and diaphoresis.       Objective:   Physical Exam  Constitutional:       Patient is alert and cooperative and in no distress. He is sitting in a wheelchair.  He is nonverbal.  HENT:  Right Ear: External ear normal.  Left Ear: External ear normal.       Oropharynx is moist  Neck: Neck supple.  Cardiovascular: Normal rate.        Heart rate is around 115-120 which is previously been at frequently but regular  Pulmonary/Chest: Effort normal and breath sounds normal. No respiratory distress. He has no wheezes. He has no rales.  Musculoskeletal: He exhibits no edema.  Lymphadenopathy:    He has no cervical adenopathy.  Neurological: He is alert.  Skin: No rash noted.          Assessment & Plan:  Patient presents with cough but no fever and nonfocal exam at this time. He is at high risk for pneumonia given the issues above. At this point has no fever and recommend observation. Low threshold to start antibiotic if he has any fever or worsening symptoms. They had left over doxycycline from prior prescription which they will consider starting if he has fever or continued productive cough.

## 2010-11-17 ENCOUNTER — Ambulatory Visit: Payer: BC Managed Care – PPO | Admitting: Physical Therapy

## 2010-11-17 ENCOUNTER — Ambulatory Visit: Payer: BC Managed Care – PPO | Admitting: Rehabilitative and Restorative Service Providers"

## 2010-11-17 ENCOUNTER — Ambulatory Visit: Payer: BC Managed Care – PPO

## 2010-11-18 ENCOUNTER — Ambulatory Visit: Payer: BC Managed Care – PPO | Attending: Internal Medicine | Admitting: Occupational Therapy

## 2010-11-18 DIAGNOSIS — R1311 Dysphagia, oral phase: Secondary | ICD-10-CM | POA: Insufficient documentation

## 2010-11-18 DIAGNOSIS — M2560 Stiffness of unspecified joint, not elsewhere classified: Secondary | ICD-10-CM | POA: Insufficient documentation

## 2010-11-18 DIAGNOSIS — M629 Disorder of muscle, unspecified: Secondary | ICD-10-CM | POA: Insufficient documentation

## 2010-11-18 DIAGNOSIS — M6281 Muscle weakness (generalized): Secondary | ICD-10-CM | POA: Insufficient documentation

## 2010-11-18 DIAGNOSIS — R4701 Aphasia: Secondary | ICD-10-CM | POA: Insufficient documentation

## 2010-11-18 DIAGNOSIS — Z5189 Encounter for other specified aftercare: Secondary | ICD-10-CM | POA: Insufficient documentation

## 2010-11-18 DIAGNOSIS — M2420 Disorder of ligament, unspecified site: Secondary | ICD-10-CM | POA: Insufficient documentation

## 2010-11-18 DIAGNOSIS — R41842 Visuospatial deficit: Secondary | ICD-10-CM | POA: Insufficient documentation

## 2010-11-18 DIAGNOSIS — R262 Difficulty in walking, not elsewhere classified: Secondary | ICD-10-CM | POA: Insufficient documentation

## 2010-11-18 DIAGNOSIS — R4189 Other symptoms and signs involving cognitive functions and awareness: Secondary | ICD-10-CM | POA: Insufficient documentation

## 2010-11-18 DIAGNOSIS — M25669 Stiffness of unspecified knee, not elsewhere classified: Secondary | ICD-10-CM | POA: Insufficient documentation

## 2010-11-18 DIAGNOSIS — R1313 Dysphagia, pharyngeal phase: Secondary | ICD-10-CM | POA: Insufficient documentation

## 2010-11-19 ENCOUNTER — Ambulatory Visit: Payer: BC Managed Care – PPO | Admitting: Rehabilitative and Restorative Service Providers"

## 2010-11-19 ENCOUNTER — Ambulatory Visit: Payer: BC Managed Care – PPO | Admitting: Occupational Therapy

## 2010-11-21 ENCOUNTER — Ambulatory Visit: Payer: BC Managed Care – PPO | Admitting: Occupational Therapy

## 2010-11-21 ENCOUNTER — Ambulatory Visit: Payer: BC Managed Care – PPO

## 2010-11-21 ENCOUNTER — Ambulatory Visit: Payer: BC Managed Care – PPO | Admitting: Rehabilitative and Restorative Service Providers"

## 2010-11-24 ENCOUNTER — Ambulatory Visit: Payer: BC Managed Care – PPO | Admitting: Physical Therapy

## 2010-11-24 ENCOUNTER — Encounter: Payer: BC Managed Care – PPO | Admitting: Occupational Therapy

## 2010-11-25 ENCOUNTER — Ambulatory Visit: Payer: BC Managed Care – PPO | Admitting: Physical Therapy

## 2010-11-25 ENCOUNTER — Ambulatory Visit: Payer: BC Managed Care – PPO

## 2010-11-25 ENCOUNTER — Ambulatory Visit: Payer: BC Managed Care – PPO | Admitting: Occupational Therapy

## 2010-11-26 ENCOUNTER — Ambulatory Visit: Payer: BC Managed Care – PPO

## 2010-11-26 ENCOUNTER — Ambulatory Visit: Payer: BC Managed Care – PPO | Admitting: Rehabilitative and Restorative Service Providers"

## 2010-11-26 ENCOUNTER — Ambulatory Visit: Payer: BC Managed Care – PPO | Admitting: Occupational Therapy

## 2010-11-28 ENCOUNTER — Ambulatory Visit: Payer: BC Managed Care – PPO | Admitting: Rehabilitative and Restorative Service Providers"

## 2010-11-28 ENCOUNTER — Ambulatory Visit: Payer: BC Managed Care – PPO | Admitting: Occupational Therapy

## 2010-11-28 ENCOUNTER — Ambulatory Visit: Payer: BC Managed Care – PPO

## 2010-12-01 ENCOUNTER — Ambulatory Visit: Payer: BC Managed Care – PPO | Admitting: Rehabilitative and Restorative Service Providers"

## 2010-12-01 ENCOUNTER — Ambulatory Visit: Payer: BC Managed Care – PPO | Admitting: Occupational Therapy

## 2010-12-01 ENCOUNTER — Ambulatory Visit: Payer: BC Managed Care – PPO

## 2010-12-03 ENCOUNTER — Ambulatory Visit: Payer: BC Managed Care – PPO | Admitting: Rehabilitative and Restorative Service Providers"

## 2010-12-03 ENCOUNTER — Encounter: Payer: BC Managed Care – PPO | Admitting: Occupational Therapy

## 2010-12-04 ENCOUNTER — Ambulatory Visit: Payer: BC Managed Care – PPO

## 2010-12-04 ENCOUNTER — Ambulatory Visit: Payer: BC Managed Care – PPO | Admitting: Occupational Therapy

## 2010-12-04 ENCOUNTER — Ambulatory Visit: Payer: BC Managed Care – PPO | Admitting: Rehabilitative and Restorative Service Providers"

## 2010-12-05 ENCOUNTER — Encounter: Payer: BC Managed Care – PPO | Admitting: Occupational Therapy
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
## 2010-12-08 ENCOUNTER — Ambulatory Visit: Payer: BC Managed Care – PPO

## 2010-12-08 ENCOUNTER — Ambulatory Visit: Payer: BC Managed Care – PPO | Admitting: Rehabilitative and Restorative Service Providers"

## 2010-12-08 ENCOUNTER — Ambulatory Visit: Payer: BC Managed Care – PPO | Admitting: Occupational Therapy

## 2010-12-09 ENCOUNTER — Ambulatory Visit: Payer: BC Managed Care – PPO | Admitting: Rehabilitative and Restorative Service Providers"

## 2010-12-09 ENCOUNTER — Ambulatory Visit: Payer: BC Managed Care – PPO

## 2010-12-09 ENCOUNTER — Ambulatory Visit: Payer: BC Managed Care – PPO | Admitting: Occupational Therapy
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
## 2010-12-10 ENCOUNTER — Ambulatory Visit: Payer: BC Managed Care – PPO | Admitting: Occupational Therapy

## 2010-12-10 ENCOUNTER — Ambulatory Visit: Payer: BC Managed Care – PPO

## 2010-12-10 ENCOUNTER — Ambulatory Visit: Payer: BC Managed Care – PPO | Admitting: Rehabilitative and Restorative Service Providers"

## 2010-12-16 ENCOUNTER — Ambulatory Visit: Payer: BC Managed Care – PPO

## 2010-12-17 ENCOUNTER — Ambulatory Visit: Payer: BC Managed Care – PPO

## 2010-12-17 ENCOUNTER — Ambulatory Visit: Payer: BC Managed Care – PPO | Admitting: Occupational Therapy

## 2010-12-17 ENCOUNTER — Ambulatory Visit: Payer: BC Managed Care – PPO | Admitting: Rehabilitative and Restorative Service Providers"

## 2010-12-18 ENCOUNTER — Ambulatory Visit: Payer: BC Managed Care – PPO | Admitting: Physical Therapy

## 2010-12-18 ENCOUNTER — Ambulatory Visit: Payer: BC Managed Care – PPO | Admitting: Occupational Therapy

## 2010-12-18 ENCOUNTER — Ambulatory Visit: Payer: BC Managed Care – PPO

## 2010-12-19 ENCOUNTER — Ambulatory Visit: Payer: BC Managed Care – PPO | Admitting: Occupational Therapy

## 2010-12-19 ENCOUNTER — Ambulatory Visit
Payer: BC Managed Care – PPO | Attending: Internal Medicine | Admitting: Rehabilitative and Restorative Service Providers"

## 2010-12-19 ENCOUNTER — Other Ambulatory Visit: Payer: Self-pay | Admitting: Internal Medicine

## 2010-12-19 ENCOUNTER — Telehealth: Payer: Self-pay | Admitting: *Deleted

## 2010-12-19 DIAGNOSIS — R41842 Visuospatial deficit: Secondary | ICD-10-CM | POA: Insufficient documentation

## 2010-12-19 DIAGNOSIS — M2420 Disorder of ligament, unspecified site: Secondary | ICD-10-CM | POA: Insufficient documentation

## 2010-12-19 DIAGNOSIS — R1311 Dysphagia, oral phase: Secondary | ICD-10-CM | POA: Insufficient documentation

## 2010-12-19 DIAGNOSIS — Z5189 Encounter for other specified aftercare: Secondary | ICD-10-CM | POA: Insufficient documentation

## 2010-12-19 DIAGNOSIS — M2560 Stiffness of unspecified joint, not elsewhere classified: Secondary | ICD-10-CM | POA: Insufficient documentation

## 2010-12-19 DIAGNOSIS — R634 Abnormal weight loss: Secondary | ICD-10-CM

## 2010-12-19 DIAGNOSIS — R4701 Aphasia: Secondary | ICD-10-CM | POA: Insufficient documentation

## 2010-12-19 DIAGNOSIS — R4189 Other symptoms and signs involving cognitive functions and awareness: Secondary | ICD-10-CM | POA: Insufficient documentation

## 2010-12-19 DIAGNOSIS — M6281 Muscle weakness (generalized): Secondary | ICD-10-CM | POA: Insufficient documentation

## 2010-12-19 DIAGNOSIS — R1313 Dysphagia, pharyngeal phase: Secondary | ICD-10-CM | POA: Insufficient documentation

## 2010-12-19 DIAGNOSIS — R262 Difficulty in walking, not elsewhere classified: Secondary | ICD-10-CM | POA: Insufficient documentation

## 2010-12-19 DIAGNOSIS — M629 Disorder of muscle, unspecified: Secondary | ICD-10-CM | POA: Insufficient documentation

## 2010-12-19 DIAGNOSIS — M25669 Stiffness of unspecified knee, not elsewhere classified: Secondary | ICD-10-CM | POA: Insufficient documentation

## 2010-12-19 NOTE — Telephone Encounter (Signed)
Father calls requesting nutrition referral-ok per dr Lovell Sheehan

## 2010-12-23 ENCOUNTER — Ambulatory Visit: Payer: BC Managed Care – PPO | Admitting: Occupational Therapy

## 2010-12-23 ENCOUNTER — Ambulatory Visit: Payer: BC Managed Care – PPO | Admitting: Physical Therapy

## 2010-12-23 ENCOUNTER — Ambulatory Visit: Payer: BC Managed Care – PPO

## 2010-12-24 ENCOUNTER — Ambulatory Visit: Payer: BC Managed Care – PPO | Admitting: Rehabilitative and Restorative Service Providers"

## 2010-12-24 ENCOUNTER — Ambulatory Visit: Payer: BC Managed Care – PPO

## 2010-12-24 ENCOUNTER — Ambulatory Visit: Payer: BC Managed Care – PPO | Admitting: Occupational Therapy
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
## 2010-12-26 ENCOUNTER — Ambulatory Visit: Payer: BC Managed Care – PPO | Admitting: Occupational Therapy

## 2010-12-26 ENCOUNTER — Ambulatory Visit: Payer: BC Managed Care – PPO

## 2010-12-26 ENCOUNTER — Ambulatory Visit: Payer: BC Managed Care – PPO | Admitting: Rehabilitative and Restorative Service Providers"

## 2010-12-29 ENCOUNTER — Encounter
Payer: BC Managed Care – PPO | Attending: Physical Medicine & Rehabilitation | Admitting: Physical Medicine & Rehabilitation

## 2010-12-29 DIAGNOSIS — G8110 Spastic hemiplegia affecting unspecified side: Secondary | ICD-10-CM | POA: Insufficient documentation

## 2010-12-30 ENCOUNTER — Ambulatory Visit: Payer: BC Managed Care – PPO | Admitting: Rehabilitative and Restorative Service Providers"

## 2010-12-30 ENCOUNTER — Ambulatory Visit: Payer: BC Managed Care – PPO | Admitting: Occupational Therapy

## 2010-12-30 ENCOUNTER — Ambulatory Visit: Payer: BC Managed Care – PPO

## 2010-12-30 NOTE — Procedures (Signed)
NAMEWERNER, LABELLA                 ACCOUNT NO.:  [id number]  MEDICAL RECORD NO.:  [id number]           PATIENT TYPE:  O  LOCATION:  [facility]                         FACILITY:  [facility]  PHYSICIAN:  Ranelle Oyster, M.D.DATE OF BIRTH:  March 25, 1986  DATE OF PROCEDURE:  12/29/2010 DATE OF DISCHARGE:                              OPERATIVE REPORT  PROCEDURE:  Botox injection.  Diagnostic code 342.11.  Spastic hemiparesis dominant limb.  DESCRIPTION OF PROCEDURE:  After informed consent, preparation of skin with isopropyl alcohol, I injected the right FDP, FDS, flexor carpi ulnaris and flexor carpi radialis as well as the pronator teres with a total of 400 units of botulinum toxin.  Each 100 units was diluted in 1 mL preservative-free normal saline.  The patient tolerated well without any adverse effects.  Minor bleeding was controlled.  Area was dressed after injection.  The patient will continue with his physical therapy, to work on range of motion and active use of the upper extremity.  I did utilize EMG guidance for these injections today.  I will see the patient back here about 2 months' time.  Dietary consult was made today.     Ranelle Oyster, M.D. Electronically Signed    ZTS/MEDQ  D:  12/29/2010 13:42:29  T:  12/30/2010 01:57:55  Job:  [id number]

## 2010-12-31 ENCOUNTER — Ambulatory Visit: Payer: BC Managed Care – PPO

## 2011-01-01 ENCOUNTER — Ambulatory Visit: Payer: BC Managed Care – PPO

## 2011-01-01 ENCOUNTER — Ambulatory Visit: Payer: BC Managed Care – PPO | Admitting: Physical Therapy

## 2011-01-01 ENCOUNTER — Ambulatory Visit: Payer: BC Managed Care – PPO | Admitting: Occupational Therapy

## 2011-01-02 ENCOUNTER — Ambulatory Visit: Payer: BC Managed Care – PPO | Admitting: Physical Therapy

## 2011-01-06 ENCOUNTER — Ambulatory Visit: Payer: BC Managed Care – PPO

## 2011-01-06 ENCOUNTER — Ambulatory Visit: Payer: BC Managed Care – PPO | Admitting: Rehabilitative and Restorative Service Providers"

## 2011-01-06 ENCOUNTER — Ambulatory Visit: Payer: BC Managed Care – PPO | Admitting: Occupational Therapy

## 2011-01-07 ENCOUNTER — Encounter: Payer: BC Managed Care – PPO | Attending: Internal Medicine | Admitting: *Deleted

## 2011-01-07 DIAGNOSIS — Z713 Dietary counseling and surveillance: Secondary | ICD-10-CM | POA: Insufficient documentation

## 2011-01-07 DIAGNOSIS — R634 Abnormal weight loss: Secondary | ICD-10-CM | POA: Insufficient documentation

## 2011-01-08 ENCOUNTER — Ambulatory Visit: Payer: BC Managed Care – PPO

## 2011-01-08 ENCOUNTER — Ambulatory Visit: Payer: BC Managed Care – PPO | Admitting: Physical Therapy

## 2011-01-08 ENCOUNTER — Ambulatory Visit: Payer: BC Managed Care – PPO | Admitting: Occupational Therapy

## 2011-01-09 ENCOUNTER — Ambulatory Visit: Payer: BC Managed Care – PPO

## 2011-01-09 ENCOUNTER — Ambulatory Visit: Payer: BC Managed Care – PPO | Admitting: Occupational Therapy

## 2011-01-09 ENCOUNTER — Ambulatory Visit: Payer: BC Managed Care – PPO | Admitting: Physical Therapy

## 2011-01-12 ENCOUNTER — Ambulatory Visit: Payer: BC Managed Care – PPO

## 2011-01-12 ENCOUNTER — Ambulatory Visit: Payer: BC Managed Care – PPO | Admitting: Occupational Therapy

## 2011-01-12 ENCOUNTER — Ambulatory Visit: Payer: BC Managed Care – PPO | Admitting: Rehabilitative and Restorative Service Providers"

## 2011-01-13 IMAGING — CT CT HEAD W/O CM
1 of 2 series · 13 of 30 positions shown, 17 images · non-contrast
Comparison: 01/09/2010.

CLINICAL DATA: VP shunt.  Status post craniotomy.

CT HEAD WITHOUT CONTRAST
TECHNIQUE: Contiguous axial images were obtained from the base of
the skull through the vertex without contrast.

[Series 2: brain · axial · 0.47mm/px · z∈[-135,-4]mm · 13 of 32 slices shown, 17 images]
[im 3/32  brain]
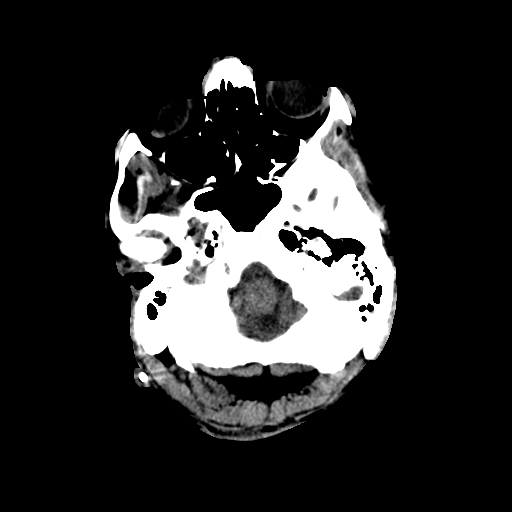
[im 3/32  bone]
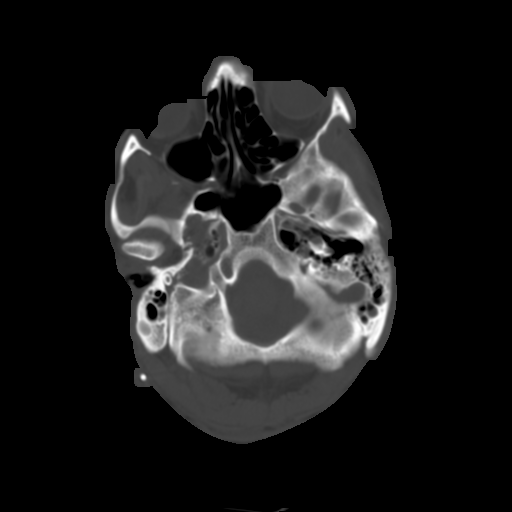
[im 5/32  brain]
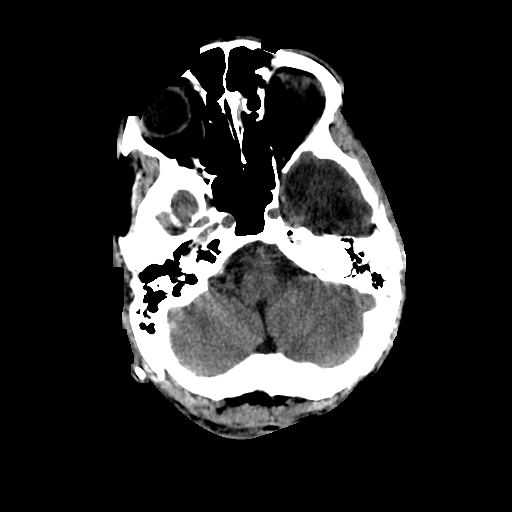
[im 7/32  brain]
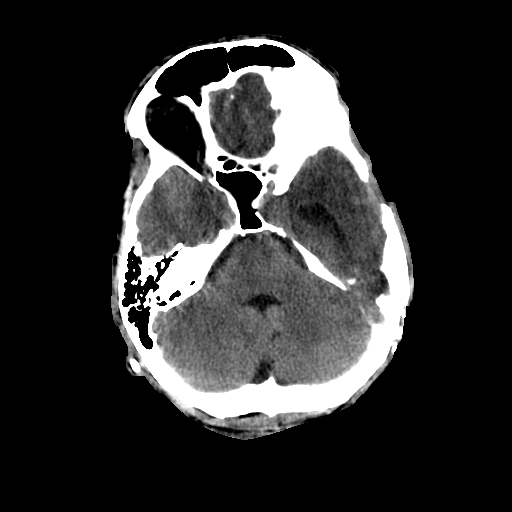
[im 9/32  brain]
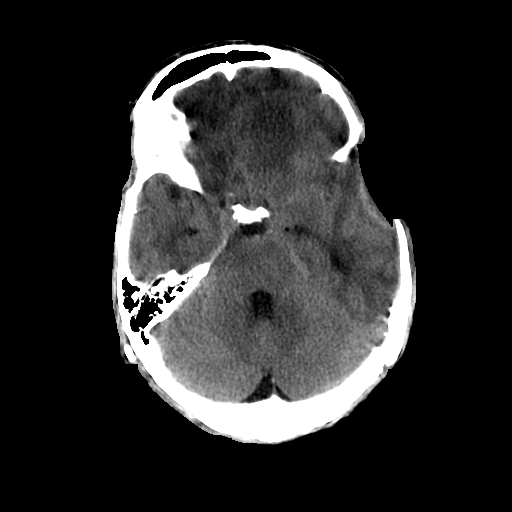
[im 12/32  brain]
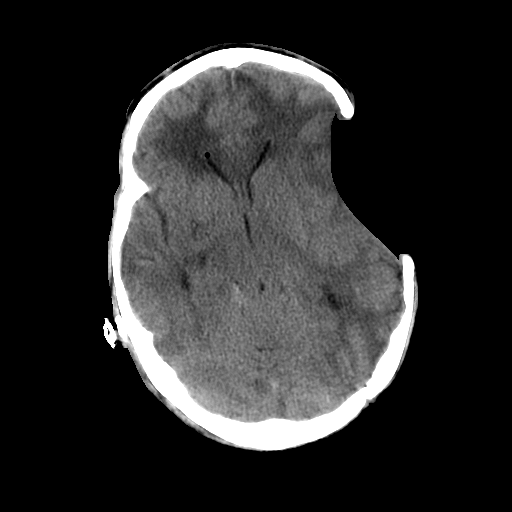
[im 12/32  bone]
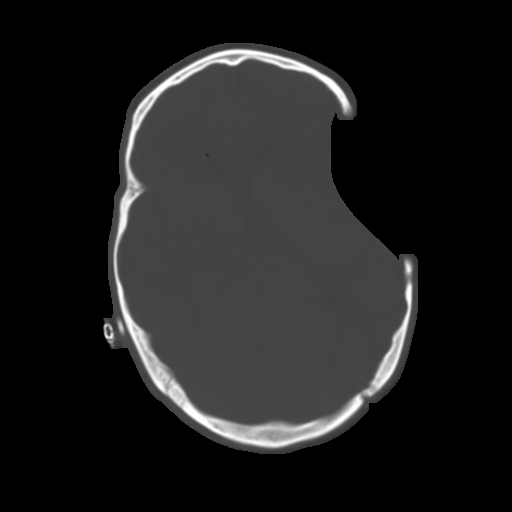
[im 14/32  brain]
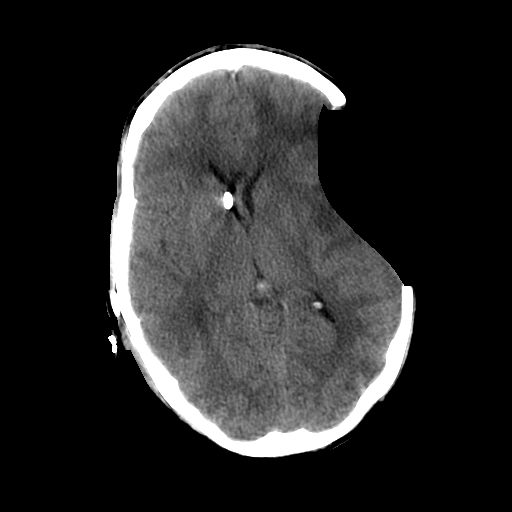
[im 16/32  brain]
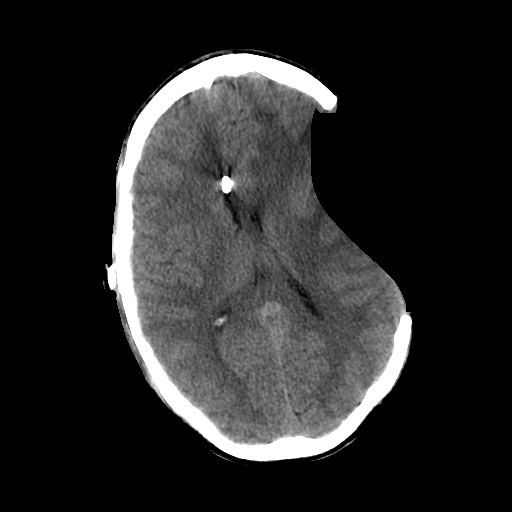
[im 18/32  brain]
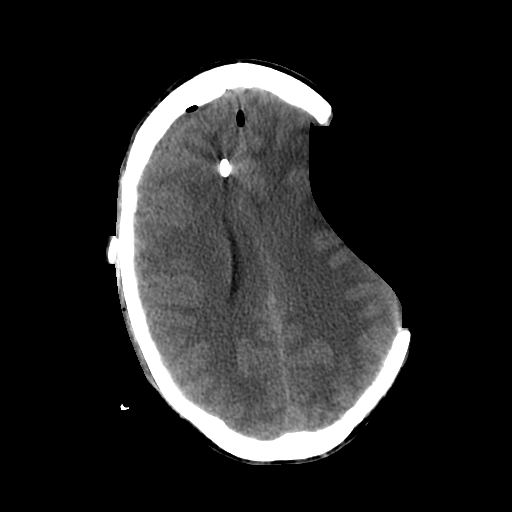
[im 20/32  brain]
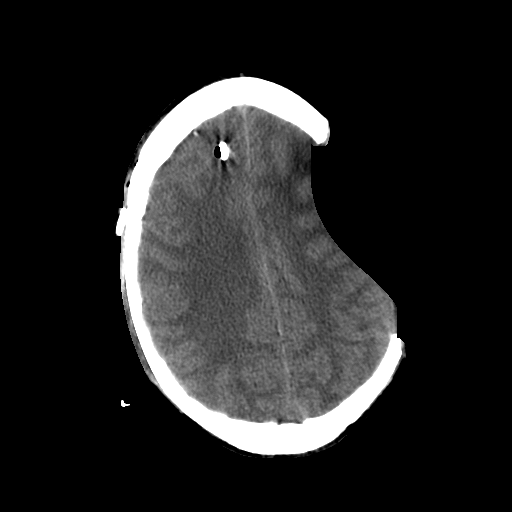
[im 20/32  bone]
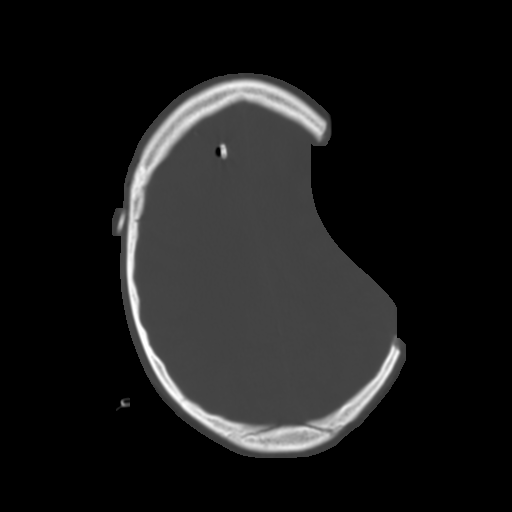
[im 23/32  brain]
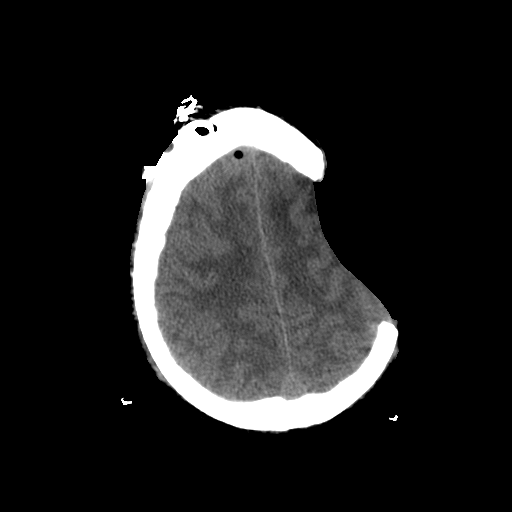
[im 25/32  brain]
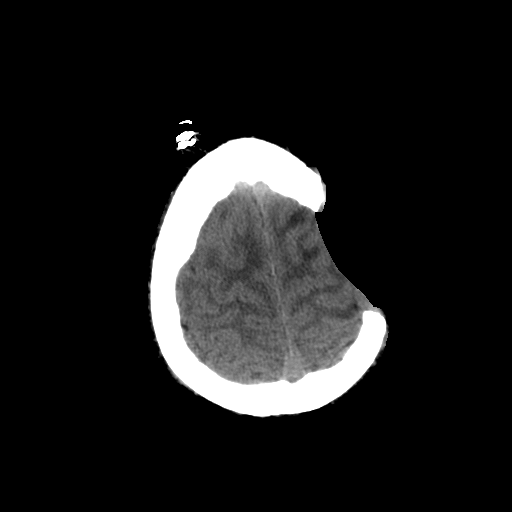
[im 27/32  brain]
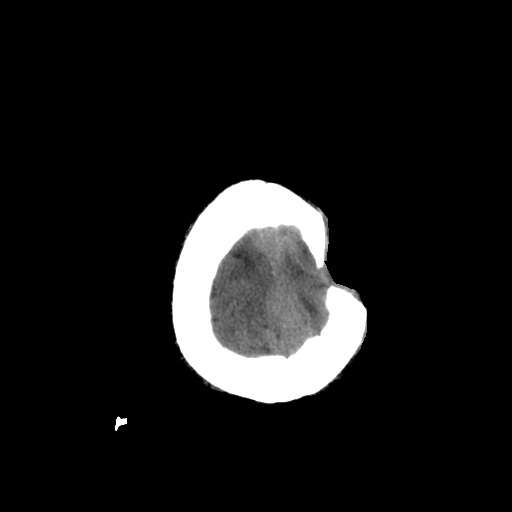
[im 29/32  brain]
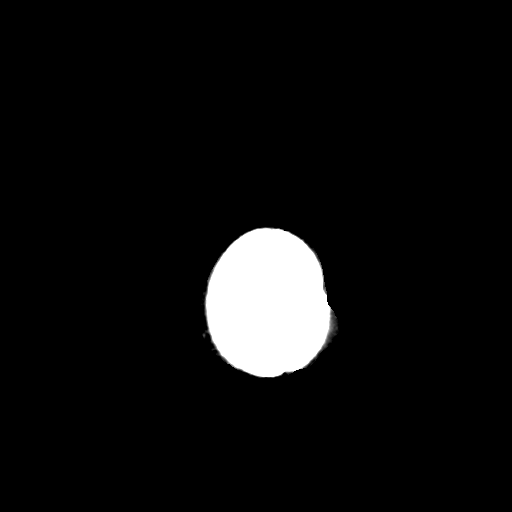
[im 29/32  bone]
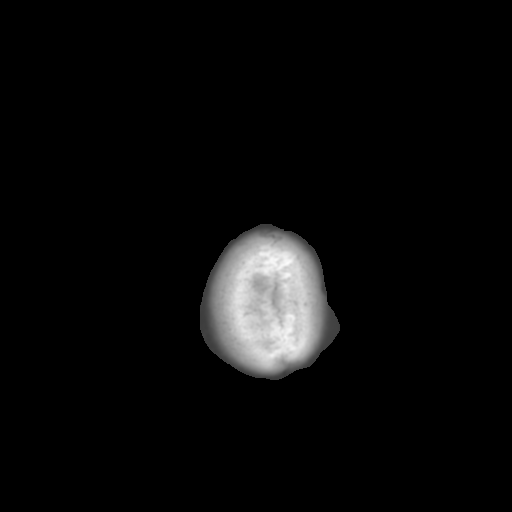

[13 of 30 positions shown; findings below may reference images not displayed]

FINDINGS: A right frontal ventriculostomy shunt has been placed in
the interval.  The lateral and third ventricles are near completely
collapsed on today's study.  The fourth ventricle is of normal
size.  The patient is status post left frontal craniotomy.  Prior
to the ventriculostomy, there was a convex appearance of the brain
at the craniotomy site.  The defect is now concave.  Rightward
shift measures 6.5 mm at the foramen of Tiger.  Diffuse white
matter changes are again seen.  Bifrontal encephalomalacia is
evident.

The complex left temporal bone fracture is again noted.  The
paranasal sinuses are clear.  Minimal fluid is present in the right
mastoid air cells.
IMPRESSION: 1.  Interval placement of the right frontal ventriculostomy shunt
with near total collapse of the ventricles.
2.  There is significant reduction and mass effect with now a
concave appearance at the craniotomy site.
3.  Left to right midline shift of 6.6 mm.
4.  Stable diffuse white matter changes and bifrontal
encephalomalacia.
5.  Complex left temporal bone fracture is again noted.

## 2011-01-14 ENCOUNTER — Ambulatory Visit: Payer: BC Managed Care – PPO

## 2011-01-14 ENCOUNTER — Ambulatory Visit: Payer: BC Managed Care – PPO | Admitting: Rehabilitative and Restorative Service Providers"

## 2011-01-14 ENCOUNTER — Ambulatory Visit: Payer: BC Managed Care – PPO | Admitting: Occupational Therapy

## 2011-01-16 ENCOUNTER — Ambulatory Visit: Payer: BC Managed Care – PPO | Admitting: Rehabilitative and Restorative Service Providers"

## 2011-01-16 ENCOUNTER — Ambulatory Visit: Payer: BC Managed Care – PPO

## 2011-01-16 ENCOUNTER — Ambulatory Visit: Payer: BC Managed Care – PPO | Admitting: Occupational Therapy

## 2011-01-20 ENCOUNTER — Encounter: Payer: BC Managed Care – PPO | Admitting: Occupational Therapy

## 2011-01-20 ENCOUNTER — Ambulatory Visit: Payer: BC Managed Care – PPO | Admitting: Rehabilitative and Restorative Service Providers"

## 2011-01-22 ENCOUNTER — Ambulatory Visit: Payer: BC Managed Care – PPO | Attending: Internal Medicine | Admitting: Occupational Therapy

## 2011-01-22 ENCOUNTER — Ambulatory Visit: Payer: BC Managed Care – PPO | Admitting: Physical Therapy

## 2011-01-22 ENCOUNTER — Ambulatory Visit: Payer: BC Managed Care – PPO

## 2011-01-22 DIAGNOSIS — R4701 Aphasia: Secondary | ICD-10-CM | POA: Insufficient documentation

## 2011-01-22 DIAGNOSIS — M2560 Stiffness of unspecified joint, not elsewhere classified: Secondary | ICD-10-CM | POA: Insufficient documentation

## 2011-01-22 DIAGNOSIS — M25669 Stiffness of unspecified knee, not elsewhere classified: Secondary | ICD-10-CM | POA: Insufficient documentation

## 2011-01-22 DIAGNOSIS — Z5189 Encounter for other specified aftercare: Secondary | ICD-10-CM | POA: Insufficient documentation

## 2011-01-22 DIAGNOSIS — M6281 Muscle weakness (generalized): Secondary | ICD-10-CM | POA: Insufficient documentation

## 2011-01-22 DIAGNOSIS — M629 Disorder of muscle, unspecified: Secondary | ICD-10-CM | POA: Insufficient documentation

## 2011-01-22 DIAGNOSIS — R41842 Visuospatial deficit: Secondary | ICD-10-CM | POA: Insufficient documentation

## 2011-01-22 DIAGNOSIS — M2420 Disorder of ligament, unspecified site: Secondary | ICD-10-CM | POA: Insufficient documentation

## 2011-01-22 DIAGNOSIS — R4189 Other symptoms and signs involving cognitive functions and awareness: Secondary | ICD-10-CM | POA: Insufficient documentation

## 2011-01-22 DIAGNOSIS — R1311 Dysphagia, oral phase: Secondary | ICD-10-CM | POA: Insufficient documentation

## 2011-01-22 DIAGNOSIS — R1313 Dysphagia, pharyngeal phase: Secondary | ICD-10-CM | POA: Insufficient documentation

## 2011-01-22 DIAGNOSIS — R262 Difficulty in walking, not elsewhere classified: Secondary | ICD-10-CM | POA: Insufficient documentation

## 2011-01-23 ENCOUNTER — Ambulatory Visit: Payer: BC Managed Care – PPO | Admitting: Occupational Therapy

## 2011-01-23 ENCOUNTER — Ambulatory Visit: Payer: BC Managed Care – PPO | Admitting: Physical Therapy

## 2011-01-23 ENCOUNTER — Ambulatory Visit: Payer: BC Managed Care – PPO

## 2011-01-26 ENCOUNTER — Ambulatory Visit: Payer: BC Managed Care – PPO | Admitting: Occupational Therapy

## 2011-01-26 ENCOUNTER — Ambulatory Visit: Payer: BC Managed Care – PPO | Admitting: Rehabilitative and Restorative Service Providers"

## 2011-01-26 ENCOUNTER — Ambulatory Visit: Payer: BC Managed Care – PPO

## 2011-01-27 ENCOUNTER — Ambulatory Visit: Payer: BC Managed Care – PPO | Admitting: Rehabilitative and Restorative Service Providers"

## 2011-01-27 ENCOUNTER — Ambulatory Visit: Payer: BC Managed Care – PPO

## 2011-01-27 ENCOUNTER — Ambulatory Visit: Payer: BC Managed Care – PPO | Admitting: Occupational Therapy

## 2011-01-29 ENCOUNTER — Ambulatory Visit: Payer: BC Managed Care – PPO

## 2011-01-29 ENCOUNTER — Ambulatory Visit: Payer: BC Managed Care – PPO | Admitting: Occupational Therapy

## 2011-01-29 ENCOUNTER — Ambulatory Visit: Payer: BC Managed Care – PPO | Admitting: Physical Therapy

## 2011-02-02 ENCOUNTER — Ambulatory Visit: Payer: BC Managed Care – PPO | Admitting: Occupational Therapy

## 2011-02-02 ENCOUNTER — Ambulatory Visit: Payer: BC Managed Care – PPO

## 2011-02-02 ENCOUNTER — Ambulatory Visit: Payer: BC Managed Care – PPO | Admitting: Rehabilitative and Restorative Service Providers"

## 2011-02-03 ENCOUNTER — Ambulatory Visit: Payer: BC Managed Care – PPO | Admitting: Rehabilitative and Restorative Service Providers"

## 2011-02-03 ENCOUNTER — Ambulatory Visit: Payer: BC Managed Care – PPO | Admitting: Occupational Therapy

## 2011-02-03 ENCOUNTER — Ambulatory Visit: Payer: BC Managed Care – PPO

## 2011-02-05 ENCOUNTER — Ambulatory Visit: Payer: BC Managed Care – PPO | Admitting: Occupational Therapy

## 2011-02-05 ENCOUNTER — Ambulatory Visit: Payer: BC Managed Care – PPO

## 2011-02-05 ENCOUNTER — Ambulatory Visit: Payer: BC Managed Care – PPO | Admitting: Rehabilitative and Restorative Service Providers"

## 2011-02-09 ENCOUNTER — Ambulatory Visit: Payer: BC Managed Care – PPO | Admitting: Rehabilitative and Restorative Service Providers"

## 2011-02-09 ENCOUNTER — Ambulatory Visit: Payer: BC Managed Care – PPO | Admitting: *Deleted

## 2011-02-09 ENCOUNTER — Ambulatory Visit: Payer: BC Managed Care – PPO | Admitting: Occupational Therapy

## 2011-02-10 ENCOUNTER — Ambulatory Visit: Payer: BC Managed Care – PPO | Admitting: Occupational Therapy

## 2011-02-10 ENCOUNTER — Ambulatory Visit: Payer: BC Managed Care – PPO | Admitting: Rehabilitative and Restorative Service Providers"

## 2011-02-13 ENCOUNTER — Ambulatory Visit: Payer: BC Managed Care – PPO | Admitting: *Deleted

## 2011-02-13 ENCOUNTER — Other Ambulatory Visit: Payer: Self-pay | Admitting: Internal Medicine

## 2011-02-13 DIAGNOSIS — K9413 Enterostomy malfunction: Secondary | ICD-10-CM

## 2011-02-15 ENCOUNTER — Other Ambulatory Visit: Payer: Self-pay | Admitting: Family Medicine

## 2011-02-16 ENCOUNTER — Ambulatory Visit: Payer: BC Managed Care – PPO | Admitting: Occupational Therapy

## 2011-02-16 ENCOUNTER — Ambulatory Visit: Payer: BC Managed Care – PPO

## 2011-02-16 ENCOUNTER — Ambulatory Visit: Payer: BC Managed Care – PPO | Admitting: Rehabilitative and Restorative Service Providers"

## 2011-02-17 ENCOUNTER — Other Ambulatory Visit: Payer: Self-pay | Admitting: Internal Medicine

## 2011-02-17 DIAGNOSIS — Z431 Encounter for attention to gastrostomy: Secondary | ICD-10-CM

## 2011-02-18 ENCOUNTER — Ambulatory Visit: Payer: BC Managed Care – PPO | Admitting: Occupational Therapy

## 2011-02-18 ENCOUNTER — Ambulatory Visit: Payer: BC Managed Care – PPO

## 2011-02-18 ENCOUNTER — Ambulatory Visit
Payer: BC Managed Care – PPO | Attending: Internal Medicine | Admitting: Rehabilitative and Restorative Service Providers"

## 2011-02-18 DIAGNOSIS — R41842 Visuospatial deficit: Secondary | ICD-10-CM | POA: Insufficient documentation

## 2011-02-18 DIAGNOSIS — R1313 Dysphagia, pharyngeal phase: Secondary | ICD-10-CM | POA: Insufficient documentation

## 2011-02-18 DIAGNOSIS — R4701 Aphasia: Secondary | ICD-10-CM | POA: Insufficient documentation

## 2011-02-18 DIAGNOSIS — R4189 Other symptoms and signs involving cognitive functions and awareness: Secondary | ICD-10-CM | POA: Insufficient documentation

## 2011-02-18 DIAGNOSIS — M25669 Stiffness of unspecified knee, not elsewhere classified: Secondary | ICD-10-CM | POA: Insufficient documentation

## 2011-02-18 DIAGNOSIS — Z5189 Encounter for other specified aftercare: Secondary | ICD-10-CM | POA: Insufficient documentation

## 2011-02-18 DIAGNOSIS — M2560 Stiffness of unspecified joint, not elsewhere classified: Secondary | ICD-10-CM | POA: Insufficient documentation

## 2011-02-18 DIAGNOSIS — R1311 Dysphagia, oral phase: Secondary | ICD-10-CM | POA: Insufficient documentation

## 2011-02-18 DIAGNOSIS — M6281 Muscle weakness (generalized): Secondary | ICD-10-CM | POA: Insufficient documentation

## 2011-02-18 DIAGNOSIS — M2420 Disorder of ligament, unspecified site: Secondary | ICD-10-CM | POA: Insufficient documentation

## 2011-02-18 DIAGNOSIS — R262 Difficulty in walking, not elsewhere classified: Secondary | ICD-10-CM | POA: Insufficient documentation

## 2011-02-18 DIAGNOSIS — M629 Disorder of muscle, unspecified: Secondary | ICD-10-CM | POA: Insufficient documentation

## 2011-02-20 ENCOUNTER — Ambulatory Visit: Payer: BC Managed Care – PPO

## 2011-02-23 ENCOUNTER — Ambulatory Visit: Payer: BC Managed Care – PPO

## 2011-02-23 ENCOUNTER — Ambulatory Visit: Payer: BC Managed Care – PPO | Admitting: Rehabilitative and Restorative Service Providers"

## 2011-02-23 ENCOUNTER — Ambulatory Visit: Payer: BC Managed Care – PPO | Admitting: Occupational Therapy

## 2011-02-25 ENCOUNTER — Ambulatory Visit: Payer: BC Managed Care – PPO | Admitting: Rehabilitative and Restorative Service Providers"

## 2011-02-25 ENCOUNTER — Ambulatory Visit: Payer: BC Managed Care – PPO | Admitting: Occupational Therapy

## 2011-02-25 ENCOUNTER — Ambulatory Visit: Payer: BC Managed Care – PPO

## 2011-03-02 ENCOUNTER — Encounter
Payer: BC Managed Care – PPO | Attending: Physical Medicine & Rehabilitation | Admitting: Physical Medicine & Rehabilitation

## 2011-03-02 DIAGNOSIS — I69965 Other paralytic syndrome following unspecified cerebrovascular disease, bilateral: Secondary | ICD-10-CM | POA: Insufficient documentation

## 2011-03-02 DIAGNOSIS — G8250 Quadriplegia, unspecified: Secondary | ICD-10-CM | POA: Insufficient documentation

## 2011-03-02 DIAGNOSIS — I6330 Cerebral infarction due to thrombosis of unspecified cerebral artery: Secondary | ICD-10-CM

## 2011-03-02 DIAGNOSIS — G8110 Spastic hemiplegia affecting unspecified side: Secondary | ICD-10-CM

## 2011-03-02 DIAGNOSIS — S069X9A Unspecified intracranial injury with loss of consciousness of unspecified duration, initial encounter: Secondary | ICD-10-CM

## 2011-03-03 ENCOUNTER — Ambulatory Visit: Payer: BC Managed Care – PPO

## 2011-03-03 ENCOUNTER — Ambulatory Visit: Payer: BC Managed Care – PPO | Admitting: Rehabilitative and Restorative Service Providers"

## 2011-03-03 ENCOUNTER — Ambulatory Visit: Payer: BC Managed Care – PPO | Admitting: Occupational Therapy

## 2011-03-03 NOTE — Assessment & Plan Note (Signed)
Paul Kane is back regarding his strokes and spastic right hemiparesis.  He had results with the Botox injections, but is still having overall hypertonicity.  He is still drooling, but more observant of his secretions.  He sees our Warehouse manager at [facility], who is really focusing on swallowing only.  Father has questions about other available speech therapies.  He is more alert in general.  He still remains on tube feeds solely for his nutrition.  REVIEW OF SYSTEMS:  Notable for the above.  Full 12-point review is in the written health and history section of the chart.  SOCIAL HISTORY:  Unchanged.  He is with his father, remains extremely supportive.  MEDICATIONS:  Ritalin 10 mg at 7 a.m. and 12 noon daily which seems to be helping Paul Kane still.  PHYSICAL EXAMINATION:  VITAL SIGNS:  Blood pressure 106/58, pulse is 90, respiratory rate 18, he is satting 97% on room air. GENERAL:  The patient is pleasant, alert, and oriented x3.  Affect is generally bright and appropriate.  Speech is extremely dysarthric, but he follows commands answers questions.  His gait is still disconjugate. He has improved tone control.  He does drool, but much better with catching his drool and actually swallowing today.  Tone in the right shoulder at the pec major and minor as well as perhaps the teres major lapse is grossly 1+/4 is 1 to 1+, perhaps 2 at times at the biceps and triceps.  He is 3/4 at the wrist flexors and finger flexors.  The pronator teres is 2+ to 3 out of 4.  Quadriceps and hamstrings are 1+ to 2 out of 4, ankle dorsiflexion and plantar flexors are 1+ out of 4.  He has underlying strength although it is difficult to discern.  I graded strength underlying in the upper extremity at 1+ to 2 out of 5.  Right lower extremity is grossly 1+ to 2 out of 5 as well.  ASSESSMENT: 1. History of traumatic brain injury with hydrocephalous, meningitis,     and right pontine stroke. 2.  Spastic tetraplegia, right greater than the left.  PLAN: 1. We discussed multiple options regarding his tone today and father     is concerned and appropriately so regarding his hepatitis B history     and using any oral antispasmodics.  He tells me that they were     tried briefly at [facility], but that they had to back off,     his liver enzymes increased.  I look at the discharge summary and     see no proof that they were actually used over concern over the     liver function tests.  He has so much tone throughout the right     side that is difficult to manage this with Botox alone.  Phenol is     another option, but father seems hesitant to pursue this also.  We     discussed range of motion, splinting, etc. which we done, however     effects of this are usually only transient after each session.  For     now, we will try the Botox going up to 500 units to treat primarily     right upper extremity. 2. He will stay with Ritalin 10 mg twice a day as this seems to be     working for him now.  Paul Kane seems to be making cognitive and     overall neurological progress at each visit I  see him. 3. We will look at other opportunities for speech therapy for [facility]     here in the region.  Certainly, we have [facility] that     may be a good option for him going forward to further work on his     language on a long-term standpoint.  [facility] will follow     up regarding his swallow perhaps look at swallowing test     sometime in the near future. 4. I will see Paul Kane back here in 2-3 weeks for Botox injections.     Ranelle Oyster, M.D. Electronically Signed    ZTS/MedQ D:  03/02/2011 13:34:55  T:  03/02/2011 21:50:15  Job #:  [id number]

## 2011-03-04 ENCOUNTER — Ambulatory Visit: Payer: BC Managed Care – PPO | Admitting: Physical Therapy

## 2011-03-04 ENCOUNTER — Ambulatory Visit: Payer: BC Managed Care – PPO

## 2011-03-04 ENCOUNTER — Ambulatory Visit: Payer: BC Managed Care – PPO | Admitting: Occupational Therapy

## 2011-03-06 ENCOUNTER — Ambulatory Visit: Payer: BC Managed Care – PPO

## 2011-03-10 ENCOUNTER — Ambulatory Visit: Payer: BC Managed Care – PPO | Admitting: Rehabilitative and Restorative Service Providers"

## 2011-03-10 ENCOUNTER — Ambulatory Visit: Payer: BC Managed Care – PPO | Admitting: Occupational Therapy

## 2011-03-10 ENCOUNTER — Ambulatory Visit: Payer: BC Managed Care – PPO

## 2011-03-12 ENCOUNTER — Ambulatory Visit: Payer: BC Managed Care – PPO | Admitting: Rehabilitative and Restorative Service Providers"

## 2011-03-12 ENCOUNTER — Ambulatory Visit: Payer: BC Managed Care – PPO

## 2011-03-12 ENCOUNTER — Ambulatory Visit: Payer: BC Managed Care – PPO | Admitting: Occupational Therapy

## 2011-03-13 ENCOUNTER — Ambulatory Visit: Payer: BC Managed Care – PPO

## 2011-03-17 ENCOUNTER — Ambulatory Visit: Payer: BC Managed Care – PPO | Admitting: Rehabilitative and Restorative Service Providers"

## 2011-03-19 ENCOUNTER — Ambulatory Visit: Payer: BC Managed Care – PPO | Admitting: Rehabilitative and Restorative Service Providers"

## 2011-03-19 ENCOUNTER — Ambulatory Visit: Payer: BC Managed Care – PPO

## 2011-03-20 ENCOUNTER — Ambulatory Visit: Payer: BC Managed Care – PPO

## 2011-03-23 ENCOUNTER — Other Ambulatory Visit (HOSPITAL_COMMUNITY): Payer: Self-pay | Admitting: Physical Medicine & Rehabilitation

## 2011-03-23 DIAGNOSIS — I639 Cerebral infarction, unspecified: Secondary | ICD-10-CM

## 2011-03-23 DIAGNOSIS — S069X9A Unspecified intracranial injury with loss of consciousness of unspecified duration, initial encounter: Secondary | ICD-10-CM

## 2011-03-24 ENCOUNTER — Ambulatory Visit (INDEPENDENT_AMBULATORY_CARE_PROVIDER_SITE_OTHER): Payer: BC Managed Care – PPO | Admitting: General Surgery

## 2011-03-24 ENCOUNTER — Encounter (INDEPENDENT_AMBULATORY_CARE_PROVIDER_SITE_OTHER): Payer: Self-pay | Admitting: General Surgery

## 2011-03-24 VITALS — BP 104/68 | HR 90

## 2011-03-24 DIAGNOSIS — Z931 Gastrostomy status: Secondary | ICD-10-CM
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
## 2011-03-25 ENCOUNTER — Ambulatory Visit: Payer: BC Managed Care – PPO | Attending: Internal Medicine

## 2011-03-25 ENCOUNTER — Ambulatory Visit: Payer: BC Managed Care – PPO | Admitting: Rehabilitative and Restorative Service Providers"

## 2011-03-25 DIAGNOSIS — R262 Difficulty in walking, not elsewhere classified: Secondary | ICD-10-CM | POA: Insufficient documentation

## 2011-03-25 DIAGNOSIS — M6281 Muscle weakness (generalized): Secondary | ICD-10-CM | POA: Insufficient documentation

## 2011-03-25 DIAGNOSIS — Z5189 Encounter for other specified aftercare: Secondary | ICD-10-CM | POA: Insufficient documentation

## 2011-03-25 DIAGNOSIS — R4189 Other symptoms and signs involving cognitive functions and awareness: Secondary | ICD-10-CM | POA: Insufficient documentation

## 2011-03-25 DIAGNOSIS — R4701 Aphasia: Secondary | ICD-10-CM | POA: Insufficient documentation

## 2011-03-25 DIAGNOSIS — R41842 Visuospatial deficit: Secondary | ICD-10-CM | POA: Insufficient documentation

## 2011-03-25 DIAGNOSIS — M25669 Stiffness of unspecified knee, not elsewhere classified: Secondary | ICD-10-CM | POA: Insufficient documentation

## 2011-03-25 DIAGNOSIS — M629 Disorder of muscle, unspecified: Secondary | ICD-10-CM | POA: Insufficient documentation

## 2011-03-25 DIAGNOSIS — R1311 Dysphagia, oral phase: Secondary | ICD-10-CM | POA: Insufficient documentation

## 2011-03-25 DIAGNOSIS — M2420 Disorder of ligament, unspecified site: Secondary | ICD-10-CM | POA: Insufficient documentation

## 2011-03-25 DIAGNOSIS — R1313 Dysphagia, pharyngeal phase: Secondary | ICD-10-CM | POA: Insufficient documentation

## 2011-03-25 DIAGNOSIS — M2560 Stiffness of unspecified joint, not elsewhere classified: Secondary | ICD-10-CM | POA: Insufficient documentation

## 2011-03-26 ENCOUNTER — Ambulatory Visit: Payer: BC Managed Care – PPO

## 2011-03-26 ENCOUNTER — Ambulatory Visit: Payer: BC Managed Care – PPO | Admitting: Physical Therapy

## 2011-03-27 ENCOUNTER — Ambulatory Visit: Payer: BC Managed Care – PPO

## 2011-03-31 ENCOUNTER — Ambulatory Visit: Payer: BC Managed Care – PPO | Admitting: Rehabilitative and Restorative Service Providers"

## 2011-03-31 ENCOUNTER — Ambulatory Visit: Payer: BC Managed Care – PPO

## 2011-04-02 ENCOUNTER — Ambulatory Visit (HOSPITAL_COMMUNITY)
Admission: RE | Admit: 2011-04-02 | Discharge: 2011-04-02 | Disposition: A | Discharge status: Home / Self Care | Payer: BC Managed Care – PPO | Source: Ambulatory Visit | Attending: Physical Medicine & Rehabilitation | Admitting: Physical Medicine & Rehabilitation

## 2011-04-02 ENCOUNTER — Ambulatory Visit: Payer: BC Managed Care – PPO | Admitting: Rehabilitative and Restorative Service Providers"

## 2011-04-02 ENCOUNTER — Ambulatory Visit: Payer: BC Managed Care – PPO

## 2011-04-02 DIAGNOSIS — R1310 Dysphagia, unspecified: Secondary | ICD-10-CM | POA: Insufficient documentation

## 2011-04-02 DIAGNOSIS — I69991 Dysphagia following unspecified cerebrovascular disease: Secondary | ICD-10-CM | POA: Insufficient documentation

## 2011-04-02 DIAGNOSIS — I639 Cerebral infarction, unspecified: Secondary | ICD-10-CM

## 2011-04-02 DIAGNOSIS — S069X9A Unspecified intracranial injury with loss of consciousness of unspecified duration, initial encounter: Secondary | ICD-10-CM

## 2011-04-03 ENCOUNTER — Ambulatory Visit: Payer: BC Managed Care – PPO

## 2011-04-07 ENCOUNTER — Ambulatory Visit: Payer: BC Managed Care – PPO | Admitting: Rehabilitative and Restorative Service Providers"

## 2011-04-07 ENCOUNTER — Ambulatory Visit: Payer: BC Managed Care – PPO

## 2011-04-08 ENCOUNTER — Ambulatory Visit: Payer: BC Managed Care – PPO

## 2011-04-09 ENCOUNTER — Ambulatory Visit: Payer: BC Managed Care – PPO

## 2011-04-09 ENCOUNTER — Ambulatory Visit: Payer: BC Managed Care – PPO | Admitting: Rehabilitative and Restorative Service Providers"

## 2011-04-13 ENCOUNTER — Ambulatory Visit: Payer: BC Managed Care – PPO | Admitting: Rehabilitative and Restorative Service Providers"

## 2011-04-13 ENCOUNTER — Ambulatory Visit: Payer: BC Managed Care – PPO

## 2011-04-13 ENCOUNTER — Encounter
Payer: BC Managed Care – PPO | Attending: Physical Medicine & Rehabilitation | Admitting: Physical Medicine & Rehabilitation

## 2011-04-13 DIAGNOSIS — G8110 Spastic hemiplegia affecting unspecified side: Secondary | ICD-10-CM | POA: Insufficient documentation

## 2011-04-14 NOTE — Procedures (Signed)
Paul Kane, Paul Kane NO.:  [id number]  MEDICAL RECORD NO.:  [id number]           PATIENT TYPE:  LOCATION:                                 FACILITY:  PHYSICIAN:  Ranelle Oyster, M.D.DATE OF BIRTH:  February 16, 1986  DATE OF PROCEDURE:  04/13/2011 DATE OF DISCHARGE:                              OPERATIVE REPORT  DESCRIPTION OF PROCEDURE:  Botox injection.  Diagnostic code 342.11.  After informed consent, preparation of skin with isopropyl alcohol use. I utilized EMG guidance to inject the FDP, FCR and FCU muscles as well as pronator teres, a total of 300 units of Botox.  I dried to disperse the medicine equally between these muscles.  Additionally we injected at the biceps brachia 100 units and pectoralis major/minor 100 units.  A total injection today of Botox was a total of 500 units.  Each 100 units was diluted in 1 mL preservative-free normal saline.  The patient tolerated it well, only minimal bleeding.  Post injection instructions were given.  I made a referral to [facility] speech program for speech clarity, articulation, as well as motor and oro-pharyngeal control.  The patient also will continue with swallowing therapy at [facility].  Consider referral to [facility].     Ranelle Oyster, M.D. Electronically Signed    ZTS/MEDQ  D:  04/13/2011 13:39:14  T:  04/13/2011 14:58:52  Job:  [id number]  cc:   Stacie Glaze, MD [address] [place] [address]

## 2011-04-15 ENCOUNTER — Ambulatory Visit: Payer: BC Managed Care – PPO

## 2011-04-15 ENCOUNTER — Ambulatory Visit: Payer: BC Managed Care – PPO | Admitting: Rehabilitative and Restorative Service Providers"

## 2011-04-17 ENCOUNTER — Encounter: Payer: Self-pay | Admitting: Family Medicine

## 2011-04-17 ENCOUNTER — Ambulatory Visit (INDEPENDENT_AMBULATORY_CARE_PROVIDER_SITE_OTHER): Payer: BC Managed Care – PPO | Admitting: Family Medicine

## 2011-04-17 VITALS — BP 112/60 | HR 130 | Temp 99.1°F | Wt 159.0 lb

## 2011-04-17 DIAGNOSIS — J40 Bronchitis, not specified as acute or chronic: Secondary | ICD-10-CM

## 2011-04-17 MED ORDER — AMOXICILLIN-POT CLAVULANATE 400-57 MG/5ML PO SUSR: 10.0000 mL | Freq: Two times a day (BID) | ORAL | Status: AC

## 2011-04-17 MED ORDER — CEFTRIAXONE SODIUM 1 G IJ SOLR
1.0000 g | INTRAMUSCULAR | Status: DC
  Administered 2011-04-17: 1 g via INTRAMUSCULAR

## 2011-04-17 NOTE — Progress Notes (Signed)
Addended by: Aniceto Boss A on: 04/17/2011 12:10 PM   Modules accepted: Orders

## 2011-04-17 NOTE — Progress Notes (Signed)
  Subjective:    Patient ID: Paul Kane, male    DOB: [birth year], 25 y.o.   MRN: [id number]  HPI Here with his parents for 2 days of runny nose, low grade fevers, ST, and a dry cough. He is NPO and is fed through a G tube because he is at high risk for aspiration pneumonia.    Review of Systems  Constitutional: Positive for fever.  HENT: Positive for rhinorrhea. Negative for congestion, sneezing and sinus pressure.   Eyes: Negative.   Respiratory: Positive for cough.        Objective:   Physical Exam  Constitutional: He appears well-developed and well-nourished.  HENT:  Right Ear: External ear normal.  Left Ear: External ear normal.  Nose: Nose normal.  Mouth/Throat: Oropharynx is clear and moist. No oropharyngeal exudate.  Eyes: Conjunctivae are normal. Pupils are equal, round, and reactive to light.  Neck: Normal range of motion. Neck supple. No thyromegaly present.  Pulmonary/Chest: Effort normal. No respiratory distress. He has no wheezes. He has no rales.       Diffuse rhonchi   Lymphadenopathy:    He has no cervical adenopathy.          Assessment & Plan:  This is bronchitis but we will treat him aggressively to prevent pneumonia. Recheck prn

## 2011-04-20 ENCOUNTER — Ambulatory Visit: Payer: BC Managed Care – PPO | Attending: Internal Medicine

## 2011-04-20 DIAGNOSIS — R262 Difficulty in walking, not elsewhere classified: Secondary | ICD-10-CM | POA: Insufficient documentation

## 2011-04-20 DIAGNOSIS — M629 Disorder of muscle, unspecified: Secondary | ICD-10-CM | POA: Insufficient documentation

## 2011-04-20 DIAGNOSIS — M6281 Muscle weakness (generalized): Secondary | ICD-10-CM | POA: Insufficient documentation

## 2011-04-20 DIAGNOSIS — M2560 Stiffness of unspecified joint, not elsewhere classified: Secondary | ICD-10-CM | POA: Insufficient documentation

## 2011-04-20 DIAGNOSIS — R1311 Dysphagia, oral phase: Secondary | ICD-10-CM | POA: Insufficient documentation

## 2011-04-20 DIAGNOSIS — R1313 Dysphagia, pharyngeal phase: Secondary | ICD-10-CM | POA: Insufficient documentation

## 2011-04-20 DIAGNOSIS — M25669 Stiffness of unspecified knee, not elsewhere classified: Secondary | ICD-10-CM | POA: Insufficient documentation

## 2011-04-20 DIAGNOSIS — R4189 Other symptoms and signs involving cognitive functions and awareness: Secondary | ICD-10-CM | POA: Insufficient documentation

## 2011-04-20 DIAGNOSIS — R4701 Aphasia: Secondary | ICD-10-CM | POA: Insufficient documentation

## 2011-04-20 DIAGNOSIS — Z5189 Encounter for other specified aftercare: Secondary | ICD-10-CM | POA: Insufficient documentation

## 2011-04-20 DIAGNOSIS — R41842 Visuospatial deficit: Secondary | ICD-10-CM | POA: Insufficient documentation

## 2011-04-20 DIAGNOSIS — M2420 Disorder of ligament, unspecified site: Secondary | ICD-10-CM | POA: Insufficient documentation

## 2011-04-21 ENCOUNTER — Ambulatory Visit: Payer: BC Managed Care – PPO | Admitting: Rehabilitative and Restorative Service Providers"

## 2011-04-21 ENCOUNTER — Ambulatory Visit: Payer: BC Managed Care – PPO

## 2011-04-24 ENCOUNTER — Ambulatory Visit: Payer: BC Managed Care – PPO | Admitting: Rehabilitative and Restorative Service Providers"

## 2011-04-24 ENCOUNTER — Ambulatory Visit: Payer: BC Managed Care – PPO

## 2011-04-27 ENCOUNTER — Ambulatory Visit: Payer: BC Managed Care – PPO | Admitting: Rehabilitative and Restorative Service Providers"

## 2011-04-27 ENCOUNTER — Ambulatory Visit: Payer: BC Managed Care – PPO

## 2011-04-29 ENCOUNTER — Ambulatory Visit: Payer: BC Managed Care – PPO | Admitting: *Deleted

## 2011-04-29 ENCOUNTER — Ambulatory Visit: Payer: BC Managed Care – PPO

## 2011-04-30 ENCOUNTER — Ambulatory Visit: Payer: BC Managed Care – PPO

## 2011-05-04 ENCOUNTER — Ambulatory Visit: Payer: BC Managed Care – PPO | Admitting: Rehabilitative and Restorative Service Providers"

## 2011-05-04 ENCOUNTER — Ambulatory Visit: Payer: BC Managed Care – PPO

## 2011-05-06 ENCOUNTER — Ambulatory Visit: Payer: BC Managed Care – PPO

## 2011-05-06 ENCOUNTER — Ambulatory Visit: Payer: BC Managed Care – PPO | Admitting: Rehabilitative and Restorative Service Providers"

## 2011-05-07 ENCOUNTER — Emergency Department (HOSPITAL_COMMUNITY): Payer: BC Managed Care – PPO

## 2011-05-07 ENCOUNTER — Emergency Department (HOSPITAL_COMMUNITY)
Admission: EM | Admit: 2011-05-07 | Discharge: 2011-05-07 | Disposition: A | Discharge status: Home / Self Care | Payer: BC Managed Care – PPO | Attending: Emergency Medicine | Admitting: Emergency Medicine

## 2011-05-07 ENCOUNTER — Ambulatory Visit: Payer: BC Managed Care – PPO

## 2011-05-07 ENCOUNTER — Encounter (HOSPITAL_COMMUNITY): Payer: Self-pay | Admitting: Radiology

## 2011-05-07 DIAGNOSIS — Z79899 Other long term (current) drug therapy: Secondary | ICD-10-CM | POA: Insufficient documentation

## 2011-05-07 DIAGNOSIS — Z87820 Personal history of traumatic brain injury: Secondary | ICD-10-CM | POA: Insufficient documentation

## 2011-05-07 DIAGNOSIS — R569 Unspecified convulsions: Secondary | ICD-10-CM | POA: Insufficient documentation

## 2011-05-07 DIAGNOSIS — Z982 Presence of cerebrospinal fluid drainage device: Secondary | ICD-10-CM | POA: Insufficient documentation

## 2011-05-07 LAB — COMPREHENSIVE METABOLIC PANEL
Albumin: 4.5 g/dL (ref 3.5–5.2)
Alkaline Phosphatase: 58 U/L (ref 39–117)
BUN: 12 mg/dL (ref 6–23)
Calcium: 10 mg/dL (ref 8.4–10.5)
Potassium: 4.2 mEq/L (ref 3.5–5.1)
Sodium: 138 mEq/L (ref 135–145)
Total Protein: 8.2 g/dL (ref 6.0–8.3)

## 2011-05-07 LAB — DIFFERENTIAL
Basophils Absolute: 0 10*3/uL (ref 0.0–0.1)
Basophils Relative: 0 % (ref 0–1)
Eosinophils Relative: 1 % (ref 0–5)
Monocytes Absolute: 0.3 10*3/uL (ref 0.1–1.0)

## 2011-05-07 LAB — CBC
HCT: 37.3 % — ABNORMAL LOW (ref 39.0–52.0)
MCHC: 35.1 g/dL (ref 30.0–36.0)
RDW: 12.1 % (ref 11.5–15.5)

## 2011-05-11 ENCOUNTER — Ambulatory Visit: Payer: BC Managed Care – PPO | Admitting: Rehabilitative and Restorative Service Providers"

## 2011-05-13 ENCOUNTER — Ambulatory Visit: Payer: BC Managed Care – PPO

## 2011-05-13 ENCOUNTER — Ambulatory Visit: Payer: BC Managed Care – PPO | Admitting: Rehabilitative and Restorative Service Providers"

## 2011-05-14 ENCOUNTER — Ambulatory Visit: Payer: BC Managed Care – PPO

## 2011-05-18 ENCOUNTER — Ambulatory Visit: Payer: BC Managed Care – PPO | Admitting: Rehabilitative and Restorative Service Providers"

## 2011-05-18 ENCOUNTER — Ambulatory Visit: Payer: BC Managed Care – PPO | Admitting: Occupational Therapy

## 2011-05-18 ENCOUNTER — Ambulatory Visit: Payer: BC Managed Care – PPO

## 2011-05-19 ENCOUNTER — Encounter: Payer: BC Managed Care – PPO | Admitting: Occupational Therapy

## 2011-05-20 ENCOUNTER — Ambulatory Visit: Payer: BC Managed Care – PPO

## 2011-05-20 ENCOUNTER — Ambulatory Visit: Payer: BC Managed Care – PPO | Admitting: Rehabilitative and Restorative Service Providers"

## 2011-05-21 ENCOUNTER — Ambulatory Visit: Payer: BC Managed Care – PPO | Attending: Internal Medicine

## 2011-05-21 DIAGNOSIS — R4189 Other symptoms and signs involving cognitive functions and awareness: Secondary | ICD-10-CM | POA: Insufficient documentation

## 2011-05-21 DIAGNOSIS — Z5189 Encounter for other specified aftercare: Secondary | ICD-10-CM | POA: Insufficient documentation

## 2011-05-21 DIAGNOSIS — M2560 Stiffness of unspecified joint, not elsewhere classified: Secondary | ICD-10-CM | POA: Insufficient documentation

## 2011-05-21 DIAGNOSIS — R41842 Visuospatial deficit: Secondary | ICD-10-CM | POA: Insufficient documentation

## 2011-05-21 DIAGNOSIS — R4701 Aphasia: Secondary | ICD-10-CM | POA: Insufficient documentation

## 2011-05-21 DIAGNOSIS — R1311 Dysphagia, oral phase: Secondary | ICD-10-CM | POA: Insufficient documentation

## 2011-05-21 DIAGNOSIS — M2420 Disorder of ligament, unspecified site: Secondary | ICD-10-CM | POA: Insufficient documentation

## 2011-05-21 DIAGNOSIS — R262 Difficulty in walking, not elsewhere classified: Secondary | ICD-10-CM | POA: Insufficient documentation

## 2011-05-21 DIAGNOSIS — M629 Disorder of muscle, unspecified: Secondary | ICD-10-CM | POA: Insufficient documentation

## 2011-05-21 DIAGNOSIS — R1313 Dysphagia, pharyngeal phase: Secondary | ICD-10-CM | POA: Insufficient documentation

## 2011-05-21 DIAGNOSIS — M25669 Stiffness of unspecified knee, not elsewhere classified: Secondary | ICD-10-CM | POA: Insufficient documentation

## 2011-05-21 DIAGNOSIS — M6281 Muscle weakness (generalized): Secondary | ICD-10-CM | POA: Insufficient documentation

## 2011-05-22 ENCOUNTER — Encounter: Payer: BC Managed Care – PPO | Admitting: Occupational Therapy

## 2011-05-25 ENCOUNTER — Ambulatory Visit: Payer: BC Managed Care – PPO | Admitting: Rehabilitative and Restorative Service Providers"

## 2011-05-25 ENCOUNTER — Ambulatory Visit: Payer: BC Managed Care – PPO | Admitting: Occupational Therapy

## 2011-05-25 ENCOUNTER — Ambulatory Visit: Payer: BC Managed Care – PPO

## 2011-05-25 ENCOUNTER — Encounter: Payer: BC Managed Care – PPO | Admitting: Occupational Therapy

## 2011-05-27 ENCOUNTER — Encounter: Payer: BC Managed Care – PPO | Admitting: Occupational Therapy

## 2011-05-27 ENCOUNTER — Ambulatory Visit: Payer: BC Managed Care – PPO

## 2011-05-27 ENCOUNTER — Ambulatory Visit: Payer: BC Managed Care – PPO | Admitting: Rehabilitative and Restorative Service Providers"

## 2011-05-28 ENCOUNTER — Ambulatory Visit: Payer: BC Managed Care – PPO | Admitting: Rehabilitative and Restorative Service Providers"

## 2011-05-28 ENCOUNTER — Ambulatory Visit: Payer: BC Managed Care – PPO

## 2011-06-01 ENCOUNTER — Ambulatory Visit: Payer: BC Managed Care – PPO | Admitting: Physical Therapy

## 2011-06-03 ENCOUNTER — Encounter: Payer: BC Managed Care – PPO | Admitting: Occupational Therapy

## 2011-06-03 ENCOUNTER — Ambulatory Visit: Payer: BC Managed Care – PPO | Admitting: Occupational Therapy

## 2011-06-03 ENCOUNTER — Ambulatory Visit: Payer: BC Managed Care – PPO | Admitting: Rehabilitative and Restorative Service Providers"

## 2011-06-03 ENCOUNTER — Ambulatory Visit: Payer: BC Managed Care – PPO

## 2011-06-05 ENCOUNTER — Ambulatory Visit: Payer: BC Managed Care – PPO | Admitting: Occupational Therapy

## 2011-06-05 ENCOUNTER — Ambulatory Visit: Payer: BC Managed Care – PPO | Admitting: Rehabilitative and Restorative Service Providers"

## 2011-06-05 ENCOUNTER — Ambulatory Visit: Payer: BC Managed Care – PPO

## 2011-06-08 ENCOUNTER — Ambulatory Visit: Payer: BC Managed Care – PPO | Admitting: Rehabilitative and Restorative Service Providers"

## 2011-06-08 ENCOUNTER — Encounter: Payer: BC Managed Care – PPO | Admitting: Occupational Therapy

## 2011-06-09 ENCOUNTER — Encounter
Payer: BC Managed Care – PPO | Attending: Physical Medicine & Rehabilitation | Admitting: Physical Medicine & Rehabilitation

## 2011-06-09 DIAGNOSIS — I69965 Other paralytic syndrome following unspecified cerebrovascular disease, bilateral: Secondary | ICD-10-CM | POA: Insufficient documentation

## 2011-06-09 DIAGNOSIS — I6330 Cerebral infarction due to thrombosis of unspecified cerebral artery: Secondary | ICD-10-CM

## 2011-06-09 DIAGNOSIS — S069X9A Unspecified intracranial injury with loss of consciousness of unspecified duration, initial encounter: Secondary | ICD-10-CM

## 2011-06-09 DIAGNOSIS — G8250 Quadriplegia, unspecified: Secondary | ICD-10-CM | POA: Insufficient documentation

## 2011-06-09 DIAGNOSIS — G8110 Spastic hemiplegia affecting unspecified side: Secondary | ICD-10-CM

## 2011-06-10 ENCOUNTER — Encounter: Payer: BC Managed Care – PPO | Admitting: Occupational Therapy

## 2011-06-10 ENCOUNTER — Ambulatory Visit: Payer: BC Managed Care – PPO | Admitting: Rehabilitative and Restorative Service Providers"
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
## 2011-06-15 ENCOUNTER — Ambulatory Visit: Payer: BC Managed Care – PPO | Admitting: Rehabilitative and Restorative Service Providers"

## 2011-06-17 ENCOUNTER — Ambulatory Visit: Payer: BC Managed Care – PPO | Admitting: Rehabilitative and Restorative Service Providers"

## 2011-06-23 ENCOUNTER — Ambulatory Visit
Payer: BC Managed Care – PPO | Attending: Internal Medicine | Admitting: Rehabilitative and Restorative Service Providers"

## 2011-06-23 DIAGNOSIS — R4189 Other symptoms and signs involving cognitive functions and awareness: Secondary | ICD-10-CM | POA: Insufficient documentation

## 2011-06-23 DIAGNOSIS — M6281 Muscle weakness (generalized): Secondary | ICD-10-CM | POA: Insufficient documentation

## 2011-06-23 DIAGNOSIS — M25669 Stiffness of unspecified knee, not elsewhere classified: Secondary | ICD-10-CM | POA: Insufficient documentation

## 2011-06-23 DIAGNOSIS — R41842 Visuospatial deficit: Secondary | ICD-10-CM | POA: Insufficient documentation

## 2011-06-23 DIAGNOSIS — Z5189 Encounter for other specified aftercare: Secondary | ICD-10-CM | POA: Insufficient documentation

## 2011-06-23 DIAGNOSIS — R262 Difficulty in walking, not elsewhere classified: Secondary | ICD-10-CM | POA: Insufficient documentation

## 2011-06-23 DIAGNOSIS — R1311 Dysphagia, oral phase: Secondary | ICD-10-CM | POA: Insufficient documentation

## 2011-06-23 DIAGNOSIS — M2420 Disorder of ligament, unspecified site: Secondary | ICD-10-CM | POA: Insufficient documentation

## 2011-06-23 DIAGNOSIS — M2560 Stiffness of unspecified joint, not elsewhere classified: Secondary | ICD-10-CM | POA: Insufficient documentation

## 2011-06-23 DIAGNOSIS — R1313 Dysphagia, pharyngeal phase: Secondary | ICD-10-CM | POA: Insufficient documentation

## 2011-06-23 DIAGNOSIS — R4701 Aphasia: Secondary | ICD-10-CM | POA: Insufficient documentation

## 2011-06-23 DIAGNOSIS — M629 Disorder of muscle, unspecified: Secondary | ICD-10-CM | POA: Insufficient documentation

## 2011-06-24 ENCOUNTER — Ambulatory Visit: Payer: BC Managed Care – PPO | Admitting: Rehabilitative and Restorative Service Providers"

## 2011-06-29 ENCOUNTER — Ambulatory Visit: Payer: BC Managed Care – PPO | Admitting: Rehabilitative and Restorative Service Providers"

## 2011-07-02 ENCOUNTER — Ambulatory Visit: Payer: BC Managed Care – PPO | Admitting: Rehabilitative and Restorative Service Providers"

## 2011-07-03 ENCOUNTER — Ambulatory Visit: Payer: BC Managed Care – PPO | Admitting: Rehabilitative and Restorative Service Providers"

## 2011-07-06 ENCOUNTER — Ambulatory Visit: Payer: BC Managed Care – PPO | Admitting: Rehabilitative and Restorative Service Providers"

## 2011-07-09 ENCOUNTER — Encounter (HOSPITAL_COMMUNITY): Payer: Self-pay | Admitting: Emergency Medicine

## 2011-07-09 ENCOUNTER — Emergency Department (HOSPITAL_COMMUNITY)
Admission: EM | Admit: 2011-07-09 | Discharge: 2011-07-09 | Disposition: A | Discharge status: Home / Self Care | Payer: BC Managed Care – PPO | Attending: Emergency Medicine | Admitting: Emergency Medicine

## 2011-07-09 ENCOUNTER — Ambulatory Visit: Payer: BC Managed Care – PPO | Admitting: Rehabilitative and Restorative Service Providers"

## 2011-07-09 DIAGNOSIS — B181 Chronic viral hepatitis B without delta-agent: Secondary | ICD-10-CM | POA: Insufficient documentation

## 2011-07-09 DIAGNOSIS — I10 Essential (primary) hypertension: Secondary | ICD-10-CM | POA: Insufficient documentation

## 2011-07-09 DIAGNOSIS — Z982 Presence of cerebrospinal fluid drainage device: Secondary | ICD-10-CM | POA: Insufficient documentation

## 2011-07-09 DIAGNOSIS — R569 Unspecified convulsions: Secondary | ICD-10-CM | POA: Insufficient documentation

## 2011-07-09 DIAGNOSIS — Z8679 Personal history of other diseases of the circulatory system: Secondary | ICD-10-CM | POA: Insufficient documentation

## 2011-07-09 DIAGNOSIS — Z87820 Personal history of traumatic brain injury: Secondary | ICD-10-CM | POA: Insufficient documentation

## 2011-07-09 MED ORDER — LEVETIRACETAM 100 MG/ML PO SOLN
1000.0000 mg | Freq: Once | ORAL | Status: AC
  Administered 2011-07-09: 1000 mg via ORAL
  Filled 2011-07-09: qty 10

## 2011-07-09 MED ORDER — LEVETIRACETAM 100 MG/ML PO SOLN
500.0000 mg | Freq: Once | ORAL | Status: DC
  Filled 2011-07-09: qty 5

## 2011-07-09 MED ORDER — LEVETIRACETAM 100 MG/ML PO SOLN: 100.0000 mg | Freq: Once | ORAL | Status: DC

## 2011-07-09 NOTE — ED Notes (Signed)
Per ems- pt coming from home where he had a grand mal seizure that lasted approx 10-15 minutes.  Ems report that pt has been having an ongoing problem with this.

## 2011-07-09 NOTE — ED Notes (Signed)
Family at bedside. seizurepadds on bed

## 2011-07-09 NOTE — ED Notes (Signed)
Seizure witnessed by mother. Hx of seizures. Pt recently placed on keppra but family was confused on dosing schedule. Pt presents to dept alert and appropriate for baseline.

## 2011-07-09 NOTE — ED Provider Notes (Signed)
History     CSN: [id number]  Arrival date & time 07/09/11  1504   First MD Initiated Contact with Patient 07/09/11 1519      Chief Complaint  Patient presents with  . Seizures    (Consider location/radiation/quality/duration/timing/severity/associated sxs/prior treatment) The history is provided by a relative. The history is limited by the condition of the patient (Traumatic brain injury).   25 year old male had a generalized seizure at home. This is his third seizure. After the first seizure, he his neurologist and neurosurgeon decided not to start him on anticonvulsants. After second seizure, he had an EEG and family was given a prescription for Keppra solution to start if he had another seizure. These 3 straight seizures have occurred in the last 2 months. Seizure was generalized and family estimates that lasted about 10 minutes. He is returned to his normal mental status although he seems somewhat fatigued. Father is concerned because he was watching television at the time, but is reassured the television probably did not have anything to do with his seizure. Father is also concerned that he seemed to be laughing a lot at the time the seizure started at, and again reassurances given that lasted about causes seizure. For a rectal Valium, but it was not used. Past Medical History  Diagnosis Date  . Chronic hepatitis B   . Traumatic brain injury   . Intracranial shunt   . Stroke   . Herniation of brain stem   . Paralysis   . Fixed pupils   . Hypertension   . Trouble swallowing   . Visual disturbance   . Weakness     Past Surgical History  Procedure Date  . Cramiectomy   . Ventriculoperitoneal shunt   . Cranioplasty     Family History  Problem Relation Age of Onset  . Diabetes    . Hyperlipidemia    . Hypertension      History  Substance Use Topics  . Smoking status: Never Smoker   . Smokeless tobacco: Never Used  . Alcohol Use: No      Review of Systems    Unable to perform ROS: Other    Allergies  Review of patient's allergies indicates no known allergies.  Home Medications   Current Outpatient Rx  Name Route Sig Dispense Refill  . PROBIOTIC FORMULA PO Oral Take 1 capsule by mouth daily.     Bernadette Hoit SODIUM 8.6-50 MG PO TABS Oral Take 2 tablets by mouth 2 (two) times daily.       BP 102/66  Pulse 97  Temp(Src) 99.3 F (37.4 C) (Oral)  Resp 18  SpO2 96%  Physical Exam  Nursing note and vitals reviewed.  25 year old male who is resting comfortably and in no acute distress. Vital signs are normal. Head is normocephalic and atraumatic. Gaze is mainly straight ahead although he will turn towards voice. Pupils are equal and reactive. He does not cooperate for your exam. Neck is nontender. Back is nontender. Lungs are clear without rales, wheezes, rhonchi. Heart has regular rate and rhythm without murmur. Abdomen is soft, flat, nontender without masses or hepatosplenomegaly. Extremities have flexion contractures but no obvious injury. Skin is warm and moist without rash. Neurologic: He is awake and alert and does respond to some things in his environment. He he has not been verbal during the ED visit and does not follow commands. Cranial nerves are grossly intact. There are no focal motor or sensory deficits.  ED Course  Procedures (including  critical care time)  Labs Reviewed - No data to display No results found.   No diagnosis found.  ECG shows normal sinus rhythm with a rate of 100, no ectopy. Normal axis. Normal P wave. Normal QRS. Normal intervals. Normal ST and T waves. Impression: normal ECG. When compared with ECG of 01/10/2010, no significant changes are noted.  Case is discussed with the patient's neurologist, Dr. Ernie Avena, who recommends giving initial dose of Keppra 1000 mg, and then start on Keppra 500 mg twice a day. The initial dose of Keppra is given in the ED.  MDM  Third seizure-will need to start on  anticonvulsant medication. He had been given a prescription for Keppra solution to start if the seizures became recurrent. He will be given initial dose here. No need for additional workup at this time.        Dione Booze, MD 07/09/11 [phone]

## 2011-07-15 ENCOUNTER — Encounter
Payer: BC Managed Care – PPO | Attending: Physical Medicine & Rehabilitation | Admitting: Physical Medicine & Rehabilitation

## 2011-07-15 DIAGNOSIS — G8110 Spastic hemiplegia affecting unspecified side: Secondary | ICD-10-CM

## 2011-07-16 ENCOUNTER — Ambulatory Visit: Payer: BC Managed Care – PPO | Admitting: Rehabilitative and Restorative Service Providers"

## 2011-07-16 NOTE — Procedures (Signed)
NAMEAFSHIN, CHRYSTAL NO.:  [id number]  MEDICAL RECORD NO.:  [id number]           PATIENT TYPE:  LOCATION:                                 FACILITY:  PHYSICIAN:  Ranelle Oyster, M.D.DATE OF BIRTH:  Jun 11, 1986  DATE OF PROCEDURE:  07/15/2011 DATE OF DISCHARGE:                              OPERATIVE REPORT  PROCEDURE:  Botox injection, diagnostic code 342.11, spastic hemiparesis dominant limb.  DESCRIPTION OF PROCEDURE:  After informed consent, preparation of skin with isopropyl alcohol, I utilized EMG guidance to inject the following muscles:  Right FDP, FDS, pronator teres, flexor carpi ulnaris, flexor carpi radialis, and flexor pollicis longus.  Using a total of 500 units of Botox, I injected these muscles diffusely today with multiple access points utilized.  We localized to areas of maximal motor unit activity and injected after aspiration.  The patient tolerated extremely well.  The patient and family were given post injection instructions.  He will continue on with therapy including Speech and Occupational Therapy at [facility].  Recent seizure has required Keppra treatment which will need to be followed closely as well.  I will see [place]io back in about 2 month's time.     Ranelle Oyster, M.D. Electronically Signed    ZTS/MEDQ  D:  07/15/2011 14:21:12  T:  07/15/2011 22:44:53  Job:  [id number]

## 2011-07-17 ENCOUNTER — Ambulatory Visit: Payer: BC Managed Care – PPO | Admitting: Rehabilitative and Restorative Service Providers"

## 2011-07-22 ENCOUNTER — Ambulatory Visit
Payer: BC Managed Care – PPO | Attending: Internal Medicine | Admitting: Rehabilitative and Restorative Service Providers"

## 2011-07-22 DIAGNOSIS — Z5189 Encounter for other specified aftercare: Secondary | ICD-10-CM | POA: Insufficient documentation

## 2011-07-22 DIAGNOSIS — R4189 Other symptoms and signs involving cognitive functions and awareness: Secondary | ICD-10-CM | POA: Insufficient documentation

## 2011-07-22 DIAGNOSIS — M2420 Disorder of ligament, unspecified site: Secondary | ICD-10-CM | POA: Insufficient documentation

## 2011-07-22 DIAGNOSIS — R262 Difficulty in walking, not elsewhere classified: Secondary | ICD-10-CM | POA: Insufficient documentation

## 2011-07-22 DIAGNOSIS — R4701 Aphasia: Secondary | ICD-10-CM | POA: Insufficient documentation

## 2011-07-22 DIAGNOSIS — M2560 Stiffness of unspecified joint, not elsewhere classified: Secondary | ICD-10-CM | POA: Insufficient documentation

## 2011-07-22 DIAGNOSIS — R41842 Visuospatial deficit: Secondary | ICD-10-CM | POA: Insufficient documentation

## 2011-07-22 DIAGNOSIS — M25669 Stiffness of unspecified knee, not elsewhere classified: Secondary | ICD-10-CM | POA: Insufficient documentation

## 2011-07-22 DIAGNOSIS — R1311 Dysphagia, oral phase: Secondary | ICD-10-CM | POA: Insufficient documentation

## 2011-07-22 DIAGNOSIS — M6281 Muscle weakness (generalized): Secondary | ICD-10-CM | POA: Insufficient documentation

## 2011-07-22 DIAGNOSIS — R1313 Dysphagia, pharyngeal phase: Secondary | ICD-10-CM | POA: Insufficient documentation

## 2011-07-22 DIAGNOSIS — M629 Disorder of muscle, unspecified: Secondary | ICD-10-CM | POA: Insufficient documentation

## 2011-07-28 ENCOUNTER — Ambulatory Visit: Payer: BC Managed Care – PPO | Admitting: Rehabilitative and Restorative Service Providers"

## 2011-08-04 ENCOUNTER — Ambulatory Visit: Payer: BC Managed Care – PPO | Admitting: Rehabilitative and Restorative Service Providers"

## 2011-08-11 ENCOUNTER — Ambulatory Visit: Payer: BC Managed Care – PPO | Admitting: Rehabilitative and Restorative Service Providers"

## 2011-08-18 ENCOUNTER — Ambulatory Visit: Payer: BC Managed Care – PPO | Admitting: Rehabilitative and Restorative Service Providers"

## 2011-08-26 ENCOUNTER — Ambulatory Visit
Payer: BC Managed Care – PPO | Attending: Internal Medicine | Admitting: Rehabilitative and Restorative Service Providers"

## 2011-08-26 DIAGNOSIS — M2420 Disorder of ligament, unspecified site: Secondary | ICD-10-CM | POA: Insufficient documentation

## 2011-08-26 DIAGNOSIS — R4189 Other symptoms and signs involving cognitive functions and awareness: Secondary | ICD-10-CM | POA: Insufficient documentation

## 2011-08-26 DIAGNOSIS — M25669 Stiffness of unspecified knee, not elsewhere classified: Secondary | ICD-10-CM | POA: Insufficient documentation

## 2011-08-26 DIAGNOSIS — R262 Difficulty in walking, not elsewhere classified: Secondary | ICD-10-CM | POA: Insufficient documentation

## 2011-08-26 DIAGNOSIS — R4701 Aphasia: Secondary | ICD-10-CM | POA: Insufficient documentation

## 2011-08-26 DIAGNOSIS — R1311 Dysphagia, oral phase: Secondary | ICD-10-CM | POA: Insufficient documentation

## 2011-08-26 DIAGNOSIS — M629 Disorder of muscle, unspecified: Secondary | ICD-10-CM | POA: Insufficient documentation

## 2011-08-26 DIAGNOSIS — M2560 Stiffness of unspecified joint, not elsewhere classified: Secondary | ICD-10-CM | POA: Insufficient documentation

## 2011-08-26 DIAGNOSIS — R41842 Visuospatial deficit: Secondary | ICD-10-CM | POA: Insufficient documentation

## 2011-08-26 DIAGNOSIS — Z5189 Encounter for other specified aftercare: Secondary | ICD-10-CM | POA: Insufficient documentation

## 2011-08-26 DIAGNOSIS — M6281 Muscle weakness (generalized): Secondary | ICD-10-CM | POA: Insufficient documentation

## 2011-08-26 DIAGNOSIS — R1313 Dysphagia, pharyngeal phase: Secondary | ICD-10-CM | POA: Insufficient documentation

## 2011-09-02 ENCOUNTER — Ambulatory Visit: Payer: BC Managed Care – PPO | Admitting: Rehabilitative and Restorative Service Providers"

## 2011-09-08 ENCOUNTER — Ambulatory Visit: Payer: BC Managed Care – PPO | Admitting: Rehabilitative and Restorative Service Providers"

## 2011-09-15 ENCOUNTER — Ambulatory Visit: Payer: BC Managed Care – PPO | Admitting: Rehabilitative and Restorative Service Providers"

## 2011-09-21 ENCOUNTER — Telehealth: Payer: Self-pay | Admitting: Physical Medicine & Rehabilitation

## 2011-09-21 NOTE — Telephone Encounter (Signed)
Therapist at [facility] thinks pt will benefit from Dyna - splint and needs Dr Riley Kill to order and authorize.  Please call father on his cell phone to let him know.

## 2011-09-22 NOTE — Telephone Encounter (Signed)
LM with pt father to have therapist contact us so we can get pt what he needs.

## 2011-09-22 NOTE — Telephone Encounter (Signed)
We can order Dyna-splint as needed. Would need specifics from therapist.  Have he/she contact us.

## 2011-09-22 NOTE — Telephone Encounter (Signed)
Please advise. Thanks.  

## 2011-09-23 ENCOUNTER — Ambulatory Visit
Payer: BC Managed Care – PPO | Attending: Internal Medicine | Admitting: Rehabilitative and Restorative Service Providers"

## 2011-09-23 DIAGNOSIS — R1311 Dysphagia, oral phase: Secondary | ICD-10-CM | POA: Insufficient documentation

## 2011-09-23 DIAGNOSIS — R41842 Visuospatial deficit: Secondary | ICD-10-CM | POA: Insufficient documentation

## 2011-09-23 DIAGNOSIS — M2420 Disorder of ligament, unspecified site: Secondary | ICD-10-CM | POA: Insufficient documentation

## 2011-09-23 DIAGNOSIS — Z5189 Encounter for other specified aftercare: Secondary | ICD-10-CM | POA: Insufficient documentation

## 2011-09-23 DIAGNOSIS — M629 Disorder of muscle, unspecified: Secondary | ICD-10-CM | POA: Insufficient documentation

## 2011-09-23 DIAGNOSIS — R4701 Aphasia: Secondary | ICD-10-CM | POA: Insufficient documentation

## 2011-09-23 DIAGNOSIS — R1313 Dysphagia, pharyngeal phase: Secondary | ICD-10-CM | POA: Insufficient documentation

## 2011-09-23 DIAGNOSIS — R4189 Other symptoms and signs involving cognitive functions and awareness: Secondary | ICD-10-CM | POA: Insufficient documentation

## 2011-09-23 DIAGNOSIS — M6281 Muscle weakness (generalized): Secondary | ICD-10-CM | POA: Insufficient documentation

## 2011-09-23 DIAGNOSIS — M2560 Stiffness of unspecified joint, not elsewhere classified: Secondary | ICD-10-CM | POA: Insufficient documentation

## 2011-09-23 DIAGNOSIS — R262 Difficulty in walking, not elsewhere classified: Secondary | ICD-10-CM | POA: Insufficient documentation

## 2011-09-23 DIAGNOSIS — M25669 Stiffness of unspecified knee, not elsewhere classified: Secondary | ICD-10-CM | POA: Insufficient documentation

## 2011-09-29 ENCOUNTER — Ambulatory Visit: Payer: BC Managed Care – PPO | Admitting: Rehabilitative and Restorative Service Providers"

## 2011-10-06 ENCOUNTER — Ambulatory Visit: Payer: BC Managed Care – PPO | Admitting: Rehabilitative and Restorative Service Providers"

## 2011-10-07 ENCOUNTER — Other Ambulatory Visit: Payer: Self-pay | Admitting: Internal Medicine

## 2011-10-07 DIAGNOSIS — Z87891 Personal history of nicotine dependence: Secondary | ICD-10-CM

## 2011-10-07 DIAGNOSIS — Z87820 Personal history of traumatic brain injury: Secondary | ICD-10-CM

## 2011-10-07 DIAGNOSIS — G911 Obstructive hydrocephalus: Secondary | ICD-10-CM

## 2011-10-08 ENCOUNTER — Telehealth: Payer: Self-pay | Admitting: Internal Medicine

## 2011-10-08 NOTE — Telephone Encounter (Signed)
Patient's father is concerned because patient is in need of a referral for swallowing therapy.  Please contact as soon as possible regarding referral.

## 2011-10-09 ENCOUNTER — Other Ambulatory Visit: Payer: Self-pay | Admitting: Internal Medicine

## 2011-10-09 DIAGNOSIS — Z87820 Personal history of traumatic brain injury: Secondary | ICD-10-CM

## 2011-10-09 DIAGNOSIS — G911 Obstructive hydrocephalus: Secondary | ICD-10-CM

## 2011-10-12 ENCOUNTER — Encounter: Payer: Self-pay | Admitting: Physical Medicine & Rehabilitation

## 2011-10-12 ENCOUNTER — Encounter
Payer: BC Managed Care – PPO | Attending: Physical Medicine & Rehabilitation | Admitting: Physical Medicine & Rehabilitation

## 2011-10-12 VITALS — BP 100/50 | HR 80 | Resp 16

## 2011-10-12 DIAGNOSIS — S069XAA Unspecified intracranial injury with loss of consciousness status unknown, initial encounter: Secondary | ICD-10-CM

## 2011-10-12 DIAGNOSIS — G8250 Quadriplegia, unspecified: Secondary | ICD-10-CM | POA: Insufficient documentation

## 2011-10-12 DIAGNOSIS — I69965 Other paralytic syndrome following unspecified cerebrovascular disease, bilateral: Secondary | ICD-10-CM | POA: Insufficient documentation

## 2011-10-12 DIAGNOSIS — S062X5S 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 pre-existing conscious levels, sequela: Secondary | ICD-10-CM | POA: Insufficient documentation

## 2011-10-12 DIAGNOSIS — S069X9A Unspecified intracranial injury with loss of consciousness of unspecified duration, initial encounter: Secondary | ICD-10-CM

## 2011-10-12 DIAGNOSIS — X58XXXS Exposure to other specified factors, sequela: Secondary | ICD-10-CM | POA: Insufficient documentation

## 2011-10-12 DIAGNOSIS — I6350 Cerebral infarction due to unspecified occlusion or stenosis of unspecified cerebral artery: Secondary | ICD-10-CM

## 2011-10-12 DIAGNOSIS — I639 Cerebral infarction, unspecified: Secondary | ICD-10-CM | POA: Insufficient documentation

## 2011-10-12 DIAGNOSIS — G8111 Spastic hemiplegia affecting right dominant side: Secondary | ICD-10-CM | POA: Insufficient documentation

## 2011-10-12 DIAGNOSIS — R1310 Dysphagia, unspecified: Secondary | ICD-10-CM | POA: Insufficient documentation

## 2011-10-12 DIAGNOSIS — S069XAS Unspecified intracranial injury with loss of consciousness status unknown, sequela: Secondary | ICD-10-CM | POA: Insufficient documentation

## 2011-10-12 DIAGNOSIS — G8110 Spastic hemiplegia affecting unspecified side: Secondary | ICD-10-CM

## 2011-10-12 DIAGNOSIS — S069X9S Unspecified intracranial injury with loss of consciousness of unspecified duration, sequela: Secondary | ICD-10-CM | POA: Insufficient documentation

## 2011-10-12 NOTE — Patient Instructions (Signed)
Work on technique rather than speed with your walking.  Work on stretching your right heel cord
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
## 2011-10-13 ENCOUNTER — Encounter: Payer: Self-pay | Admitting: Physical Medicine & Rehabilitation

## 2011-10-20 ENCOUNTER — Ambulatory Visit
Payer: BC Managed Care – PPO | Attending: Internal Medicine | Admitting: Rehabilitative and Restorative Service Providers"

## 2011-10-20 DIAGNOSIS — M629 Disorder of muscle, unspecified: Secondary | ICD-10-CM | POA: Insufficient documentation

## 2011-10-20 DIAGNOSIS — Z5189 Encounter for other specified aftercare: Secondary | ICD-10-CM | POA: Insufficient documentation

## 2011-10-20 DIAGNOSIS — M25669 Stiffness of unspecified knee, not elsewhere classified: Secondary | ICD-10-CM | POA: Insufficient documentation

## 2011-10-20 DIAGNOSIS — R1311 Dysphagia, oral phase: Secondary | ICD-10-CM | POA: Insufficient documentation

## 2011-10-20 DIAGNOSIS — R41842 Visuospatial deficit: Secondary | ICD-10-CM | POA: Insufficient documentation

## 2011-10-20 DIAGNOSIS — R262 Difficulty in walking, not elsewhere classified: Secondary | ICD-10-CM | POA: Insufficient documentation

## 2011-10-20 DIAGNOSIS — R4701 Aphasia: Secondary | ICD-10-CM | POA: Insufficient documentation

## 2011-10-20 DIAGNOSIS — M2560 Stiffness of unspecified joint, not elsewhere classified: Secondary | ICD-10-CM | POA: Insufficient documentation

## 2011-10-20 DIAGNOSIS — M2420 Disorder of ligament, unspecified site: Secondary | ICD-10-CM | POA: Insufficient documentation

## 2011-10-20 DIAGNOSIS — M6281 Muscle weakness (generalized): Secondary | ICD-10-CM | POA: Insufficient documentation

## 2011-10-20 DIAGNOSIS — R1313 Dysphagia, pharyngeal phase: Secondary | ICD-10-CM | POA: Insufficient documentation

## 2011-10-20 DIAGNOSIS — R4189 Other symptoms and signs involving cognitive functions and awareness: Secondary | ICD-10-CM | POA: Insufficient documentation

## 2011-10-27 ENCOUNTER — Ambulatory Visit: Payer: BC Managed Care – PPO | Admitting: Rehabilitative and Restorative Service Providers"

## 2011-11-03 ENCOUNTER — Ambulatory Visit: Payer: BC Managed Care – PPO | Admitting: Rehabilitative and Restorative Service Providers"

## 2011-11-10 ENCOUNTER — Ambulatory Visit: Payer: BC Managed Care – PPO | Admitting: Rehabilitative and Restorative Service Providers"

## 2011-11-17 ENCOUNTER — Ambulatory Visit: Payer: BC Managed Care – PPO | Admitting: Rehabilitative and Restorative Service Providers"

## 2011-11-25 ENCOUNTER — Encounter: Payer: Self-pay | Admitting: Physical Medicine & Rehabilitation

## 2011-11-25 ENCOUNTER — Encounter
Payer: BC Managed Care – PPO | Attending: Physical Medicine & Rehabilitation | Admitting: Physical Medicine & Rehabilitation

## 2011-11-25 VITALS — BP 104/56 | HR 73 | Resp 16 | Ht 67.0 in | Wt 156.0 lb

## 2011-11-25 DIAGNOSIS — X58XXXS Exposure to other specified factors, sequela: Secondary | ICD-10-CM | POA: Insufficient documentation

## 2011-11-25 DIAGNOSIS — G8110 Spastic hemiplegia affecting unspecified side: Secondary | ICD-10-CM

## 2011-11-25 DIAGNOSIS — I639 Cerebral infarction, unspecified: Secondary | ICD-10-CM

## 2011-11-25 DIAGNOSIS — S069XAA Unspecified intracranial injury with loss of consciousness status unknown, initial encounter: Secondary | ICD-10-CM

## 2011-11-25 DIAGNOSIS — I6350 Cerebral infarction due to unspecified occlusion or stenosis of unspecified cerebral artery: Secondary | ICD-10-CM

## 2011-11-25 DIAGNOSIS — S069X9A Unspecified intracranial injury with loss of consciousness of unspecified duration, initial encounter: Secondary | ICD-10-CM | POA: Insufficient documentation

## 2011-11-25 NOTE — Progress Notes (Signed)
Botulinum Toxin Injection Procedure Note  Pre-operative Diagnosis: Spastic tetraplegia  Post-operative Diagnosis: Same  Indications: Spasticity that adversely impacts function, hygeine and pain  Procedure Details  The risks, benefits, indications, potential complications, and alternatives were explained to the patient and informed consent was obtained.  The limb(s) for injection were identified and a time out called to re-identify the correct limb for injection. After prepping the skin with alcohol overlying the following muscles, botulinum toxin was injected intramuscularly using EMG guidance as follows.  Right pectoralis major:  100 units Right pronator teres:  50 units Right flexor carpi ulnaris:  25 units Right flexor carpi radialis:  25 units Right flexor digitorum profundus:  50 units Right flexor digitorum superficialis:  50 units Right flexor pollicus longus:  100 units adductor pollicis, oponens pollicis:  100 units  Total botox units injected: 100 Each 100 units was diluted in 1cc of preservative free NS  Total botox units wasted: 0   Plan: Return to clinic for assessment of botulinum toxin effectiveness in 2 months.  I wrote a prescription for follow up OT at [facility] as well. All questions were encouraged and answered.

## 2011-11-25 NOTE — Patient Instructions (Signed)
Continue with therapies.  You are doing a great job!

## 2011-11-27 ENCOUNTER — Telehealth: Payer: Self-pay | Admitting: Internal Medicine

## 2011-11-27 NOTE — Telephone Encounter (Signed)
He will need 3 15 minute space for cpx- total of 45 minutes

## 2011-11-27 NOTE — Telephone Encounter (Signed)
Patient mom came in to make a cpx appt for him. There are none available and he need it by the end of June. Please assist.

## 2012-01-07 ENCOUNTER — Other Ambulatory Visit (INDEPENDENT_AMBULATORY_CARE_PROVIDER_SITE_OTHER): Payer: BC Managed Care – PPO

## 2012-01-07 DIAGNOSIS — Z0000 Encounter for general adult medical examination without abnormal findings: Secondary | ICD-10-CM

## 2012-01-07 LAB — POCT URINALYSIS DIPSTICK
Glucose, UA: NEGATIVE
Nitrite, UA: NEGATIVE
Protein, UA: NEGATIVE
Spec Grav, UA: 1.02
Urobilinogen, UA: 0.2

## 2012-01-07 LAB — BASIC METABOLIC PANEL
CO2: 30 mEq/L (ref 19–32)
Calcium: 9.5 mg/dL (ref 8.4–10.5)
Chloride: 103 mEq/L (ref 96–112)
Glucose, Bld: 81 mg/dL (ref 70–99)
Potassium: 3.7 mEq/L (ref 3.5–5.1)
Sodium: 140 mEq/L (ref 135–145)

## 2012-01-07 LAB — CBC WITH DIFFERENTIAL/PLATELET
Basophils Relative: 0.3 % (ref 0.0–3.0)
Eosinophils Relative: 2.7 % (ref 0.0–5.0)
Lymphocytes Relative: 33.4 % (ref 12.0–46.0)
MCV: 95.1 fl (ref 78.0–100.0)
Monocytes Absolute: 0.2 10*3/uL (ref 0.1–1.0)
Monocytes Relative: 5.3 % (ref 3.0–12.0)
Neutrophils Relative %: 58.3 % (ref 43.0–77.0)
Platelets: 129 10*3/uL — ABNORMAL LOW (ref 150.0–400.0)
RBC: 3.9 Mil/uL — ABNORMAL LOW (ref 4.22–5.81)
WBC: 3.9 10*3/uL — ABNORMAL LOW (ref 4.5–10.5)

## 2012-01-07 LAB — LIPID PANEL
LDL Cholesterol: 38 mg/dL (ref 0–99)
Total CHOL/HDL Ratio: 3
VLDL: 19.8 mg/dL (ref 0.0–40.0)

## 2012-01-07 LAB — HEPATIC FUNCTION PANEL
ALT: 34 U/L (ref 0–53)
AST: 23 U/L (ref 0–37)
Alkaline Phosphatase: 54 U/L (ref 39–117)
Bilirubin, Direct: 0 mg/dL (ref 0.0–0.3)
Total Bilirubin: 0.4 mg/dL (ref 0.3–1.2)
Total Protein: 7.6 g/dL (ref 6.0–8.3)

## 2012-01-14 ENCOUNTER — Ambulatory Visit (INDEPENDENT_AMBULATORY_CARE_PROVIDER_SITE_OTHER): Payer: BC Managed Care – PPO | Admitting: Internal Medicine

## 2012-01-14 ENCOUNTER — Encounter: Payer: Self-pay | Admitting: Internal Medicine

## 2012-01-14 VITALS — BP 100/70 | HR 72 | Temp 98.2°F | Resp 16 | Ht 67.0 in | Wt 152.0 lb

## 2012-01-14 DIAGNOSIS — Z0000 Encounter for general adult medical examination without abnormal findings: Secondary | ICD-10-CM

## 2012-01-14 DIAGNOSIS — K9413 Enterostomy malfunction: Secondary | ICD-10-CM

## 2012-01-14 DIAGNOSIS — S0990XA Unspecified injury of head, initial encounter: Secondary | ICD-10-CM

## 2012-01-14 NOTE — Patient Instructions (Addendum)
The patient is instructed to continue all medications as prescribed. Schedule followup with check out clerk upon leaving the clinic Call mother [phone]
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
## 2012-01-19 ENCOUNTER — Encounter (INDEPENDENT_AMBULATORY_CARE_PROVIDER_SITE_OTHER): Payer: BC Managed Care – PPO | Admitting: General Surgery

## 2012-01-19 ENCOUNTER — Telehealth (INDEPENDENT_AMBULATORY_CARE_PROVIDER_SITE_OTHER): Payer: Self-pay

## 2012-01-19 NOTE — Telephone Encounter (Signed)
Called patients family to have them bring patient in for check of leaking g tube. No answer. Left message to call me back to give them urgent office appointment today at 2:45 to see Dr. Johna Sheriff since Dr. Lindie Spruce not available.

## 2012-01-20 ENCOUNTER — Encounter (INDEPENDENT_AMBULATORY_CARE_PROVIDER_SITE_OTHER): Payer: Self-pay

## 2012-01-20 ENCOUNTER — Telehealth: Payer: Self-pay | Admitting: Internal Medicine

## 2012-01-20 NOTE — Telephone Encounter (Signed)
Centrum liquid daily was ordered

## 2012-01-20 NOTE — Telephone Encounter (Signed)
Patient's mother would like you to call in script for a multi vitamin with iron (liquid) to [facility] on [address].

## 2012-01-26 ENCOUNTER — Encounter
Payer: BC Managed Care – PPO | Attending: Physical Medicine & Rehabilitation | Admitting: Physical Medicine & Rehabilitation

## 2012-01-26 DIAGNOSIS — I6350 Cerebral infarction due to unspecified occlusion or stenosis of unspecified cerebral artery: Secondary | ICD-10-CM | POA: Insufficient documentation

## 2012-01-26 DIAGNOSIS — G8110 Spastic hemiplegia affecting unspecified side: Secondary | ICD-10-CM | POA: Insufficient documentation

## 2012-01-26 DIAGNOSIS — S069XAA Unspecified intracranial injury with loss of consciousness status unknown, initial encounter: Secondary | ICD-10-CM | POA: Insufficient documentation

## 2012-01-26 DIAGNOSIS — X58XXXS Exposure to other specified factors, sequela: Secondary | ICD-10-CM | POA: Insufficient documentation

## 2012-01-26 DIAGNOSIS — S069X9A Unspecified intracranial injury with loss of consciousness of unspecified duration, initial encounter: Secondary | ICD-10-CM | POA: Insufficient documentation

## 2012-02-04 ENCOUNTER — Encounter (INDEPENDENT_AMBULATORY_CARE_PROVIDER_SITE_OTHER): Payer: Self-pay | Admitting: General Surgery

## 2012-02-04 ENCOUNTER — Ambulatory Visit (INDEPENDENT_AMBULATORY_CARE_PROVIDER_SITE_OTHER): Payer: BC Managed Care – PPO | Admitting: General Surgery

## 2012-02-04 VITALS — BP 118/60 | HR 82 | Temp 97.7°F | Resp 18 | Ht 67.5 in | Wt 152.4 lb

## 2012-02-04 DIAGNOSIS — K9423 Gastrostomy malfunction: Secondary | ICD-10-CM

## 2012-02-04 DIAGNOSIS — K9429 Other complications of gastrostomy: Secondary | ICD-10-CM

## 2012-02-04 NOTE — Progress Notes (Signed)
The patient comes in with a tear in his gastrostomy tube. This tear is significantly distal to the insertion at the skin site where her to cut out the tube to reinsert the input stopper. I tied down the connection between the new stopper and the gastrostomy tube using a 0 silk suture. The gastrostomy tube did not have to be replaced.  I discussed with the Mom that the patient needs to take a stool softener twice a day to help with that very hard bowel movements which come every 2-3 days. Also discussed with her using gastrostomy tube is needed and that we can remove when the patient starts take oral intake reliably  The patient can come to see me on a when necessary basis.

## 2012-02-10 ENCOUNTER — Telehealth: Payer: Self-pay | Admitting: Physical Medicine & Rehabilitation

## 2012-02-10 NOTE — Telephone Encounter (Signed)
He was just botoxed 2.5 months ago.  Could do botox again in mid august

## 2012-02-10 NOTE — Telephone Encounter (Signed)
Patient is at [facility] and therapy is wanting to "cast" him, but they want him to have Botox first.  Is this something you want to order?

## 2012-02-19 NOTE — Telephone Encounter (Signed)
Botox auth'd and scheduled. °

## 2012-03-14 ENCOUNTER — Encounter
Payer: BC Managed Care – PPO | Attending: Physical Medicine & Rehabilitation | Admitting: Physical Medicine & Rehabilitation

## 2012-03-14 ENCOUNTER — Encounter: Payer: Self-pay | Admitting: Physical Medicine & Rehabilitation

## 2012-03-14 VITALS — BP 95/53 | HR 85 | Resp 14 | Ht 67.0 in | Wt 154.8 lb

## 2012-03-14 DIAGNOSIS — G8110 Spastic hemiplegia affecting unspecified side: Secondary | ICD-10-CM | POA: Insufficient documentation

## 2012-03-14 DIAGNOSIS — X58XXXS Exposure to other specified factors, sequela: Secondary | ICD-10-CM | POA: Insufficient documentation

## 2012-03-14 DIAGNOSIS — S069XAA Unspecified intracranial injury with loss of consciousness status unknown, initial encounter: Secondary | ICD-10-CM | POA: Insufficient documentation

## 2012-03-14 DIAGNOSIS — S069X9A Unspecified intracranial injury with loss of consciousness of unspecified duration, initial encounter: Secondary | ICD-10-CM | POA: Insufficient documentation

## 2012-03-14 NOTE — Patient Instructions (Signed)
Continue range of motion and activity as tolerated

## 2012-03-14 NOTE — Progress Notes (Signed)
Botox Injection for spasticity using needle EMG guidance  Dilution: 100 Units/ml Indication: Severe spasticity which interferes with ADL,mobility and/or  hygiene and is unresponsive to medication management and other conservative care Informed consent was obtained after describing risks and benefits of the procedure with the patient. This includes bleeding, bruising, infection, excessive weakness, or medication side effects. A REMS form is on file and signed. Needle: 25g monopolar injectable needle electrode Number of units per muscle Pectoralis 150 Teres major 50 Biceps100  Pronator Teres 150 Pronator Quadratus 50   All injections were done after obtaining appropriate EMG activity and after negative drawback for blood. The patient tolerated the procedure well. Post procedure instructions were given. A followup appointment was made.   I made a referral to [facility] PT to further address gait. ?knee brace.  I also wrote him a new rx for OT to work on his RUE after botox. It sounds as if they will be doing some serial casting.  I'll see Haryi back in about 3 months.

## 2012-04-07 ENCOUNTER — Encounter: Payer: Self-pay | Admitting: Family Medicine

## 2012-04-07 ENCOUNTER — Ambulatory Visit (INDEPENDENT_AMBULATORY_CARE_PROVIDER_SITE_OTHER): Payer: Medicaid Other | Admitting: Family Medicine

## 2012-04-07 VITALS — BP 92/72 | Temp 97.8°F | Wt 153.0 lb

## 2012-04-07 DIAGNOSIS — J309 Allergic rhinitis, unspecified: Secondary | ICD-10-CM

## 2012-04-07 DIAGNOSIS — H9209 Otalgia, unspecified ear: Secondary | ICD-10-CM

## 2012-04-07 MED ORDER — FLUTICASONE PROPIONATE 50 MCG/ACT NA SUSP: 2.0000 | Freq: Every day | NASAL | Status: DC
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
## 2012-04-20 DIAGNOSIS — H9319 Tinnitus, unspecified ear: Secondary | ICD-10-CM | POA: Insufficient documentation

## 2012-04-20 DIAGNOSIS — H905 Unspecified sensorineural hearing loss: Secondary | ICD-10-CM | POA: Insufficient documentation

## 2012-04-20 DIAGNOSIS — H902 Conductive hearing loss, unspecified: Secondary | ICD-10-CM | POA: Insufficient documentation

## 2012-04-25 ENCOUNTER — Telehealth: Payer: Self-pay | Admitting: Internal Medicine

## 2012-04-25 NOTE — Telephone Encounter (Signed)
Called to check on status for [employer] for a range of motion device for pts arm. Also needed to get doctors notes.  Dr Lovell Sheehan is showing a the referring physician.

## 2012-04-25 NOTE — Telephone Encounter (Signed)
Talked with them and they had called wrong office- will call dr Arvilla Market

## 2012-04-29 ENCOUNTER — Telehealth: Payer: Self-pay

## 2012-04-29 NOTE — Telephone Encounter (Signed)
Follow up on paperwork for ROM device.

## 2012-04-29 NOTE — Telephone Encounter (Signed)
Request has been faxed multiple times.  Correct number noted.  Will look out for it and they will call next week if not done.

## 2012-05-04 ENCOUNTER — Telehealth: Payer: Self-pay | Admitting: Physical Medicine & Rehabilitation

## 2012-05-04 NOTE — Telephone Encounter (Signed)
PT at [facility] wants patient to have Botox injection to tibialis posterior.  Schedule?

## 2012-05-04 NOTE — Telephone Encounter (Signed)
i can examine and if i feel that it is indicated we can do it amongst other areas. Can set up for injections and i'll determine which muscles when i see him.  Has to be at least 3 months since i last did injections however. We will probably be outside that time frame.

## 2012-05-05 NOTE — Telephone Encounter (Signed)
I sent for authorization 500 units Botox with same dx, and same CPT's.  Is this ok should you inject different muscles?  Thanks.

## 2012-05-06 NOTE — Telephone Encounter (Signed)
That should be fine. thx

## 2012-05-20 ENCOUNTER — Telehealth: Payer: Self-pay | Admitting: Family Medicine

## 2012-05-20 NOTE — Telephone Encounter (Signed)
Script was faxed to number given

## 2012-05-20 NOTE — Telephone Encounter (Signed)
RN calling, requesting rx change on the Jevity 1.5 Cal (JEVITY 1.5) LIQD Patient is currently taking 5 cans per day. Rx they have states 6.8 - they would like a new one for 5 cans. Please fax to [phone].

## 2012-06-13 ENCOUNTER — Encounter: Payer: BC Managed Care – PPO | Admitting: Physical Medicine & Rehabilitation

## 2012-06-21 ENCOUNTER — Encounter: Payer: Self-pay | Admitting: Physical Medicine & Rehabilitation

## 2012-06-21 ENCOUNTER — Encounter
Payer: BC Managed Care – PPO | Attending: Physical Medicine & Rehabilitation | Admitting: Physical Medicine & Rehabilitation

## 2012-06-21 VITALS — BP 107/56 | HR 80 | Ht 67.0 in | Wt 155.2 lb

## 2012-06-21 DIAGNOSIS — S069XAA Unspecified intracranial injury with loss of consciousness status unknown, initial encounter: Secondary | ICD-10-CM

## 2012-06-21 DIAGNOSIS — S069X9A Unspecified intracranial injury with loss of consciousness of unspecified duration, initial encounter: Secondary | ICD-10-CM

## 2012-06-21 DIAGNOSIS — G8110 Spastic hemiplegia affecting unspecified side: Secondary | ICD-10-CM

## 2012-06-21 NOTE — Patient Instructions (Signed)
CALL ME WITH QUESTIONS 

## 2012-06-21 NOTE — Progress Notes (Signed)
Botox Injection for spasticity using needle EMG guidance  Dilution: 100 Units/ml Indication: Severe spasticity which interferes with ADL,mobility and/or  hygiene and is unresponsive to medication management and other conservative care Informed consent was obtained after describing risks and benefits of the procedure with the patient. This includes bleeding, bruising, infection, excessive weakness, or medication side effects. A REMS form is on file and signed. Needle: 50mm 26g needle electrode Number of units per muscle Pectoralis 100U Biceps -- FCR -- FCU-- FDS 50 U FDP 50 U OP, ABP- 50 U PT 100 u Lumbricals: 50 u Tibialis Posterior 100 U Gastrosoleus- Hamstrings- All injections were done after obtaining appropriate EMG activity and after negative drawback for blood. The patient tolerated the procedure well. Post procedure instructions were given. A followup appointment was made in 2 months. Referrals were made for PT and OT.

## 2012-07-04 ENCOUNTER — Ambulatory Visit (INDEPENDENT_AMBULATORY_CARE_PROVIDER_SITE_OTHER): Payer: BC Managed Care – PPO | Admitting: Internal Medicine

## 2012-07-04 VITALS — BP 110/70 | HR 76 | Temp 98.1°F | Resp 16 | Ht 66.0 in | Wt 153.0 lb

## 2012-07-04 DIAGNOSIS — I10 Essential (primary) hypertension: Secondary | ICD-10-CM

## 2012-07-04 DIAGNOSIS — Z5181 Encounter for therapeutic drug level monitoring: Secondary | ICD-10-CM

## 2012-07-04 DIAGNOSIS — T887XXA Unspecified adverse effect of drug or medicament, initial encounter: Secondary | ICD-10-CM

## 2012-07-04 LAB — BASIC METABOLIC PANEL
BUN: 11 mg/dL (ref 6–23)
Calcium: 9.6 mg/dL (ref 8.4–10.5)
GFR: 176.86 mL/min (ref >60.00)
Glucose, Bld: 79 mg/dL (ref 70–99)
Potassium: 3.9 mEq/L (ref 3.5–5.1)
Sodium: 141 mEq/L (ref 135–145)

## 2012-07-04 LAB — HEPATIC FUNCTION PANEL
AST: 20 U/L (ref 0–37)
Albumin: 4.6 g/dL (ref 3.5–5.2)
Alkaline Phosphatase: 51 U/L (ref 39–117)
Total Bilirubin: 0.6 mg/dL (ref 0.3–1.2)

## 2012-07-05 ENCOUNTER — Encounter: Payer: Self-pay | Admitting: Internal Medicine

## 2012-07-21 ENCOUNTER — Ambulatory Visit
Payer: BC Managed Care – PPO | Attending: Physical Medicine & Rehabilitation | Admitting: Rehabilitative and Restorative Service Providers"

## 2012-07-21 DIAGNOSIS — IMO0001 Reserved for inherently not codable concepts without codable children: Secondary | ICD-10-CM | POA: Insufficient documentation

## 2012-07-21 DIAGNOSIS — M629 Disorder of muscle, unspecified: Secondary | ICD-10-CM | POA: Insufficient documentation

## 2012-07-21 DIAGNOSIS — R269 Unspecified abnormalities of gait and mobility: Secondary | ICD-10-CM | POA: Insufficient documentation

## 2012-07-21 DIAGNOSIS — M2420 Disorder of ligament, unspecified site: Secondary | ICD-10-CM | POA: Insufficient documentation

## 2012-07-25 ENCOUNTER — Ambulatory Visit: Payer: BC Managed Care – PPO | Admitting: Rehabilitative and Restorative Service Providers"

## 2012-07-29 ENCOUNTER — Ambulatory Visit: Payer: BC Managed Care – PPO | Admitting: Rehabilitative and Restorative Service Providers"

## 2012-07-31 ENCOUNTER — Encounter: Payer: Self-pay | Admitting: Internal Medicine
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
## 2012-08-01 ENCOUNTER — Ambulatory Visit: Payer: BC Managed Care – PPO | Admitting: Rehabilitative and Restorative Service Providers"

## 2012-08-04 ENCOUNTER — Ambulatory Visit: Payer: BC Managed Care – PPO | Admitting: Rehabilitative and Restorative Service Providers"

## 2012-08-08 ENCOUNTER — Ambulatory Visit: Payer: BC Managed Care – PPO | Admitting: Rehabilitative and Restorative Service Providers"

## 2012-08-12 ENCOUNTER — Ambulatory Visit: Payer: BC Managed Care – PPO | Admitting: Rehabilitative and Restorative Service Providers"

## 2012-08-15 ENCOUNTER — Ambulatory Visit: Payer: BC Managed Care – PPO | Admitting: Rehabilitative and Restorative Service Providers"

## 2012-08-18 ENCOUNTER — Ambulatory Visit: Payer: BC Managed Care – PPO | Admitting: Rehabilitative and Restorative Service Providers"

## 2012-08-22 ENCOUNTER — Encounter: Payer: BC Managed Care – PPO | Admitting: Physical Medicine & Rehabilitation

## 2012-08-23 ENCOUNTER — Ambulatory Visit
Payer: BC Managed Care – PPO | Attending: Physical Medicine & Rehabilitation | Admitting: Rehabilitative and Restorative Service Providers"

## 2012-08-23 DIAGNOSIS — M2420 Disorder of ligament, unspecified site: Secondary | ICD-10-CM | POA: Insufficient documentation

## 2012-08-23 DIAGNOSIS — IMO0001 Reserved for inherently not codable concepts without codable children: Secondary | ICD-10-CM | POA: Insufficient documentation

## 2012-08-23 DIAGNOSIS — R269 Unspecified abnormalities of gait and mobility: Secondary | ICD-10-CM | POA: Insufficient documentation

## 2012-08-23 DIAGNOSIS — M629 Disorder of muscle, unspecified: Secondary | ICD-10-CM | POA: Insufficient documentation

## 2012-08-25 ENCOUNTER — Ambulatory Visit: Payer: BC Managed Care – PPO | Admitting: Rehabilitative and Restorative Service Providers"

## 2012-08-29 ENCOUNTER — Ambulatory Visit: Payer: BC Managed Care – PPO | Admitting: Rehabilitative and Restorative Service Providers"

## 2012-08-30 ENCOUNTER — Ambulatory Visit: Payer: BC Managed Care – PPO | Admitting: Physical Medicine & Rehabilitation

## 2012-09-01 ENCOUNTER — Encounter: Payer: BC Managed Care – PPO | Admitting: Rehabilitative and Restorative Service Providers"

## 2012-09-02 ENCOUNTER — Ambulatory Visit: Payer: BC Managed Care – PPO | Admitting: Rehabilitative and Restorative Service Providers"

## 2012-09-05 ENCOUNTER — Ambulatory Visit: Payer: BC Managed Care – PPO | Admitting: Rehabilitative and Restorative Service Providers"

## 2012-09-07 ENCOUNTER — Encounter
Payer: BC Managed Care – PPO | Attending: Physical Medicine & Rehabilitation | Admitting: Physical Medicine & Rehabilitation

## 2012-09-07 ENCOUNTER — Encounter: Payer: Self-pay | Admitting: Physical Medicine & Rehabilitation

## 2012-09-07 VITALS — BP 102/53 | HR 104 | Resp 14 | Ht 66.0 in | Wt 155.0 lb

## 2012-09-07 DIAGNOSIS — R1310 Dysphagia, unspecified: Secondary | ICD-10-CM

## 2012-09-07 DIAGNOSIS — G8110 Spastic hemiplegia affecting unspecified side: Secondary | ICD-10-CM | POA: Insufficient documentation

## 2012-09-07 NOTE — Patient Instructions (Signed)
CALL ME WITH QUESTIONS 
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
## 2012-09-08 ENCOUNTER — Ambulatory Visit: Payer: BC Managed Care – PPO | Admitting: Rehabilitative and Restorative Service Providers"

## 2012-09-12 ENCOUNTER — Ambulatory Visit: Payer: BC Managed Care – PPO | Admitting: Rehabilitative and Restorative Service Providers"

## 2012-09-15 ENCOUNTER — Ambulatory Visit: Payer: BC Managed Care – PPO | Admitting: Rehabilitative and Restorative Service Providers"

## 2012-09-19 ENCOUNTER — Ambulatory Visit
Payer: BC Managed Care – PPO | Attending: Physical Medicine & Rehabilitation | Admitting: Rehabilitative and Restorative Service Providers"

## 2012-09-19 DIAGNOSIS — M2420 Disorder of ligament, unspecified site: Secondary | ICD-10-CM | POA: Insufficient documentation

## 2012-09-19 DIAGNOSIS — R269 Unspecified abnormalities of gait and mobility: Secondary | ICD-10-CM | POA: Insufficient documentation

## 2012-09-19 DIAGNOSIS — M629 Disorder of muscle, unspecified: Secondary | ICD-10-CM | POA: Insufficient documentation

## 2012-09-19 DIAGNOSIS — IMO0001 Reserved for inherently not codable concepts without codable children: Secondary | ICD-10-CM | POA: Insufficient documentation

## 2012-09-22 ENCOUNTER — Emergency Department (HOSPITAL_COMMUNITY): Payer: BC Managed Care – PPO

## 2012-09-22 ENCOUNTER — Encounter (HOSPITAL_COMMUNITY): Payer: Self-pay | Admitting: *Deleted

## 2012-09-22 ENCOUNTER — Emergency Department (HOSPITAL_COMMUNITY)
Admission: EM | Admit: 2012-09-22 | Discharge: 2012-09-22 | Disposition: A | Discharge status: Home / Self Care | Payer: BC Managed Care – PPO | Attending: Emergency Medicine | Admitting: Emergency Medicine

## 2012-09-22 ENCOUNTER — Ambulatory Visit: Payer: BC Managed Care – PPO | Admitting: Rehabilitative and Restorative Service Providers"

## 2012-09-22 DIAGNOSIS — Z87828 Personal history of other (healed) physical injury and trauma: Secondary | ICD-10-CM | POA: Insufficient documentation

## 2012-09-22 DIAGNOSIS — Z8669 Personal history of other diseases of the nervous system and sense organs: Secondary | ICD-10-CM | POA: Insufficient documentation

## 2012-09-22 DIAGNOSIS — I10 Essential (primary) hypertension: Secondary | ICD-10-CM | POA: Insufficient documentation

## 2012-09-22 DIAGNOSIS — Z79899 Other long term (current) drug therapy: Secondary | ICD-10-CM | POA: Insufficient documentation

## 2012-09-22 DIAGNOSIS — Z982 Presence of cerebrospinal fluid drainage device: Secondary | ICD-10-CM | POA: Insufficient documentation

## 2012-09-22 DIAGNOSIS — Z8619 Personal history of other infectious and parasitic diseases: Secondary | ICD-10-CM | POA: Insufficient documentation

## 2012-09-22 DIAGNOSIS — Z8673 Personal history of transient ischemic attack (TIA), and cerebral infarction without residual deficits: Secondary | ICD-10-CM | POA: Insufficient documentation

## 2012-09-22 DIAGNOSIS — Z139 Encounter for screening, unspecified: Secondary | ICD-10-CM

## 2012-09-22 DIAGNOSIS — Z87820 Personal history of traumatic brain injury: Secondary | ICD-10-CM | POA: Insufficient documentation

## 2012-09-22 NOTE — ED Notes (Signed)
Pt ambulatory leaving with father; pt and father given d/c teaching and follow up care instructions; father has no further questions upon d/c

## 2012-09-22 NOTE — ED Provider Notes (Signed)
History     CSN: [id number]  Arrival date & time 09/22/12  2536   First MD Initiated Contact with Patient 09/22/12 1102      Chief Complaint  Patient presents with  . Dysphagia    (Consider location/radiation/quality/duration/timing/severity/associated sxs/prior treatment) HPI Comments: Patient with a G-tube in place, was recently cleared to swallow following a radiographic swallow study performed at [facility] 6 days ago. He has been eating well with thickened liquids since then. Today he had eaten about 4 ounces of applesauce and then his father had given him some ice chips. After giving the ice chips, the patient began coughing and appeared to be slightly choking. The father asked if he was okay and the patient indicated that he was feeling bad indicated some throat pain. Patient was in his usual state of health prior to this. No new episodes of weakness, seizure activity, changes in mental status. Level 5 caveat due to brain injury and difficulty communicating.    The history is provided by the patient and a parent.    Past Medical History  Diagnosis Date  . Chronic hepatitis B   . Traumatic brain injury   . Intracranial shunt   . Stroke   . Herniation of brain stem   . Paralysis   . Fixed pupils   . Hypertension   . Trouble swallowing   . Visual disturbance   . Weakness   . Intracranial injury of other and unspecified nature, without mention of open intracranial wound, unspecified state of consciousness   . Cerebral thrombosis with cerebral infarction   . Spastic hemiplegia affecting dominant side     Past Surgical History  Procedure Laterality Date  . Cramiectomy    . Ventriculoperitoneal shunt    . Cranioplasty      Family History  Problem Relation Age of Onset  . Diabetes    . Hyperlipidemia    . Hypertension      History  Substance Use Topics  . Smoking status: Never Smoker   . Smokeless tobacco: Never Used  . Alcohol Use: No      Review of  Systems  Unable to perform ROS: Other    Allergies  Review of patient's allergies indicates no known allergies.  Home Medications   Current Outpatient Rx  Name  Route  Sig  Dispense  Refill  . Jevity 1.5 Cal (JEVITY 1.5) LIQD   Oral   Take 237 mLs by mouth 5 (five) times daily.         Marland Kitchen levETIRAcetam (KEPPRA) 100 MG/ML solution   Oral   Take 500 mg by mouth 2 (two) times daily.         . Multiple Vitamin (MULTIVITAMIN) tablet   Oral   Take 1 tablet by mouth daily.         . Probiotic Product (PROBIOTIC FORMULA PO)   Oral   Take 1 capsule by mouth daily.          Marland Kitchen senna (SENOKOT) 8.6 MG tablet   Oral   Take 1 tablet by mouth 2 (two) times daily.           BP 114/63  Pulse 86  Temp(Src) 97.4 F (36.3 C) (Oral)  Resp 18  SpO2 100%  Physical Exam  Nursing note and vitals reviewed. Constitutional: He appears well-developed and well-nourished. No distress.  HENT:  Head: Atraumatic.  Mouth/Throat: Uvula is midline, oropharynx is clear and moist and mucous membranes are normal. No oropharyngeal exudate,  posterior oropharyngeal edema or posterior oropharyngeal erythema.  Neck: Neck supple.  Cardiovascular: Normal rate and regular rhythm.   Pulmonary/Chest: Effort normal and breath sounds normal. No stridor. No respiratory distress. He has no wheezes. He exhibits no tenderness.  Abdominal: Soft. There is no tenderness.  Neurological: He is alert.  Skin: Skin is warm.    ED Course  Procedures (including critical care time)  Labs Reviewed - No data to display Dg Chest 2 View  09/22/2012  *RADIOLOGY REPORT*  Clinical Data: Cough and sore throat.  Aspiration.  CHEST - 2 VIEW  Comparison: 05/07/2011 and prior chest radiographs  Findings: The cardiomediastinal silhouette is unremarkable. The lungs are clear. There is no evidence of focal airspace disease, pulmonary edema, suspicious pulmonary nodule/mass, pleural effusion, or pneumothorax. No acute bony  abnormalities are identified. An IVC filter and ventriculoperitoneal shunt catheter again noted.  IMPRESSION: No evidence of active cardiopulmonary disease.   Original Report Authenticated By: Harmon Pier, M.D.      1. Screening     Room air saturation is 100% I interpret this to be normal.  MDM  Patient appears stable and in no respiratory distress. Chest x-ray that was performed by protocol reveals no acute abnormalities. I reviewed the films myself and review the radiologist's interpretation. Patient has no new neurologic findings. Patient and family are reassured. I have indicated that there is no need for prophylactic antibiotics with minimal aspiration of possibly a small amount of applesauce and more likely just a few ice chips do to the thinness of the water.        Gavin Pound. Ghim, MD 09/22/12 1136

## 2012-09-22 NOTE — Discharge Instructions (Signed)
 Your chest xray showed no abnormalities according to our radiologist.  Please watch for fevers, further choking episodes or other concerns.  Otherwise I recommend following up with Dr. Mavis as an outpatient next week.

## 2012-09-22 NOTE — ED Notes (Addendum)
Father reports pt cleared on Friday at [facility] for eating by swallowing test. Started eating puree and nectar consistency. Had applesauce and ice chips this am. Pt began to cough after having ice chips. Pt still has g-tube in place. Pt c/o throat pain. Father concerned about aspiration. Pt NAD now.

## 2012-09-22 NOTE — ED Notes (Signed)
Dr Oletta Lamas at bedside; pt states he feels "bad." Pts father states pt was at [facility] and was cleared to be given food at home like pureed and nectar; Father states pt started coughing after ice chips. Pt points to throat when asked where it hurts.

## 2012-09-26 ENCOUNTER — Ambulatory Visit: Payer: BC Managed Care – PPO | Admitting: Rehabilitative and Restorative Service Providers"

## 2012-09-29 ENCOUNTER — Ambulatory Visit: Payer: BC Managed Care – PPO | Admitting: Rehabilitative and Restorative Service Providers"

## 2012-10-03 ENCOUNTER — Ambulatory Visit: Payer: BC Managed Care – PPO | Admitting: Rehabilitative and Restorative Service Providers"

## 2012-10-04 ENCOUNTER — Encounter
Payer: BC Managed Care – PPO | Attending: Physical Medicine & Rehabilitation | Admitting: Physical Medicine & Rehabilitation

## 2012-10-04 ENCOUNTER — Encounter: Payer: Self-pay | Admitting: Physical Medicine & Rehabilitation

## 2012-10-04 VITALS — BP 99/55 | HR 80 | Resp 14 | Ht 67.0 in | Wt 152.6 lb

## 2012-10-04 DIAGNOSIS — I639 Cerebral infarction, unspecified: Secondary | ICD-10-CM

## 2012-10-04 DIAGNOSIS — G8110 Spastic hemiplegia affecting unspecified side: Secondary | ICD-10-CM | POA: Insufficient documentation

## 2012-10-04 NOTE — Patient Instructions (Signed)
CALL ME WITH ANY PROBLEMS OR QUESTIONS (#[phone]).  HAVE A GOOD DAY  

## 2012-10-04 NOTE — Progress Notes (Signed)
Botox Injection for spasticity using needle EMG guidance  Botox injections to the right flexor carpi radialis, ulnaris, FDS, FDP, Tibilais posterior, tib anterior, EHL, pronator teres   Dilution: 100 Units/ml Indication: Severe spasticity which interferes with ADL,mobility and/or  hygiene and is unresponsive to medication management and other conservative care Informed consent was obtained after describing risks and benefits of the procedure with the patient. This includes bleeding, bruising, infection, excessive weakness, or medication side effects. A REMS form is on file and signed. Needle: 50mm 26g needle electrode Number of units per muscle  FDS 75  FDP75 FPL50  PT 100 Tib posterior 100 Tib anterior 50 EHL 50 All injections were done after obtaining appropriate EMG activity and after negative drawback for blood. The patient tolerated the procedure well. Post procedure instructions were given. A followup appointment was made.

## 2012-10-06 ENCOUNTER — Ambulatory Visit: Payer: BC Managed Care – PPO | Admitting: Rehabilitative and Restorative Service Providers"

## 2012-10-10 ENCOUNTER — Ambulatory Visit: Payer: BC Managed Care – PPO | Admitting: Rehabilitative and Restorative Service Providers"

## 2012-10-13 ENCOUNTER — Ambulatory Visit: Payer: BC Managed Care – PPO | Admitting: Rehabilitative and Restorative Service Providers"

## 2012-10-17 ENCOUNTER — Ambulatory Visit: Payer: BC Managed Care – PPO | Admitting: Rehabilitative and Restorative Service Providers"

## 2012-10-18 ENCOUNTER — Ambulatory Visit
Payer: BC Managed Care – PPO | Attending: Physical Medicine & Rehabilitation | Admitting: Rehabilitative and Restorative Service Providers"

## 2012-10-18 DIAGNOSIS — R269 Unspecified abnormalities of gait and mobility: Secondary | ICD-10-CM | POA: Insufficient documentation

## 2012-10-18 DIAGNOSIS — M2420 Disorder of ligament, unspecified site: Secondary | ICD-10-CM | POA: Insufficient documentation

## 2012-10-18 DIAGNOSIS — M629 Disorder of muscle, unspecified: Secondary | ICD-10-CM | POA: Insufficient documentation

## 2012-10-18 DIAGNOSIS — IMO0001 Reserved for inherently not codable concepts without codable children: Secondary | ICD-10-CM | POA: Insufficient documentation

## 2012-10-24 ENCOUNTER — Ambulatory Visit: Payer: BC Managed Care – PPO | Admitting: Rehabilitative and Restorative Service Providers"

## 2012-10-27 ENCOUNTER — Ambulatory Visit: Payer: BC Managed Care – PPO | Admitting: Rehabilitative and Restorative Service Providers"

## 2012-11-01 ENCOUNTER — Ambulatory Visit: Payer: BC Managed Care – PPO | Admitting: Rehabilitative and Restorative Service Providers"

## 2012-11-03 ENCOUNTER — Ambulatory Visit: Payer: BC Managed Care – PPO | Admitting: Rehabilitative and Restorative Service Providers"

## 2012-11-07 ENCOUNTER — Ambulatory Visit: Payer: BC Managed Care – PPO | Admitting: Rehabilitative and Restorative Service Providers"

## 2012-11-10 ENCOUNTER — Ambulatory Visit: Payer: BC Managed Care – PPO | Admitting: Rehabilitative and Restorative Service Providers"

## 2012-11-11 ENCOUNTER — Ambulatory Visit: Payer: BC Managed Care – PPO | Admitting: Rehabilitative and Restorative Service Providers"

## 2012-11-12 ENCOUNTER — Emergency Department (HOSPITAL_COMMUNITY)
Admission: EM | Admit: 2012-11-12 | Discharge: 2012-11-12 | Disposition: A | Discharge status: Home / Self Care | Payer: BC Managed Care – PPO | Attending: Emergency Medicine | Admitting: Emergency Medicine

## 2012-11-12 ENCOUNTER — Emergency Department (HOSPITAL_COMMUNITY): Payer: BC Managed Care – PPO

## 2012-11-12 ENCOUNTER — Encounter (HOSPITAL_COMMUNITY): Payer: Self-pay | Admitting: Emergency Medicine

## 2012-11-12 DIAGNOSIS — R059 Cough, unspecified: Secondary | ICD-10-CM | POA: Insufficient documentation

## 2012-11-12 DIAGNOSIS — Z982 Presence of cerebrospinal fluid drainage device: Secondary | ICD-10-CM | POA: Insufficient documentation

## 2012-11-12 DIAGNOSIS — Z87828 Personal history of other (healed) physical injury and trauma: Secondary | ICD-10-CM | POA: Insufficient documentation

## 2012-11-12 DIAGNOSIS — J309 Allergic rhinitis, unspecified: Secondary | ICD-10-CM

## 2012-11-12 DIAGNOSIS — R498 Other voice and resonance disorders: Secondary | ICD-10-CM | POA: Insufficient documentation

## 2012-11-12 DIAGNOSIS — Z8619 Personal history of other infectious and parasitic diseases: Secondary | ICD-10-CM | POA: Insufficient documentation

## 2012-11-12 DIAGNOSIS — R1310 Dysphagia, unspecified: Secondary | ICD-10-CM | POA: Insufficient documentation

## 2012-11-12 DIAGNOSIS — I10 Essential (primary) hypertension: Secondary | ICD-10-CM | POA: Insufficient documentation

## 2012-11-12 DIAGNOSIS — J029 Acute pharyngitis, unspecified: Secondary | ICD-10-CM | POA: Insufficient documentation

## 2012-11-12 DIAGNOSIS — Z8673 Personal history of transient ischemic attack (TIA), and cerebral infarction without residual deficits: Secondary | ICD-10-CM | POA: Insufficient documentation

## 2012-11-12 DIAGNOSIS — R05 Cough: Secondary | ICD-10-CM | POA: Insufficient documentation

## 2012-11-12 DIAGNOSIS — Z8679 Personal history of other diseases of the circulatory system: Secondary | ICD-10-CM | POA: Insufficient documentation

## 2012-11-12 DIAGNOSIS — Z8669 Personal history of other diseases of the nervous system and sense organs: Secondary | ICD-10-CM | POA: Insufficient documentation

## 2012-11-12 MED ORDER — LORATADINE 5 MG/5ML PO SYRP: 10.0000 mg | ORAL_SOLUTION | Freq: Every day | ORAL | Status: DC

## 2012-11-12 MED ORDER — FLUTICASONE PROPIONATE 50 MCG/ACT NA SUSP: 2.0000 | Freq: Every day | NASAL | Status: DC

## 2012-11-12 NOTE — ED Notes (Signed)
Family reports pt has runny nose with nasal drainage to back of throat. Pt told family he had a sore throat. Family reports pt had allergy symptoms a couple of days ago.

## 2012-11-12 NOTE — ED Provider Notes (Signed)
History  This chart was scribed for non-physician practitioner working with Flint Melter, MD by Erskine Emery, ED Scribe. This patient was seen in room TR11C/TR11C and the patient's care was started at 19:44.   CSN: [id number]  Arrival date & time 11/12/12  1850   First MD Initiated Contact with Patient 11/12/12 1944      Chief Complaint  Patient presents with  . Nasal Congestion  . Sore Throat    (Consider location/radiation/quality/duration/timing/severity/associated sxs/prior treatment) The history is provided by the patient and a parent. No language interpreter was used.  Paul Kane is a 27 y.o. male who presents to the Emergency Department complaining of worsening nasal congestion, sore throat, rhinorrhea, and coughing since last night. Pt has a h/o stroke and now cannot swallow voluntarily, is fed through a tube, and cannot cough voluntarily. Pt's parents report a voice change since last night. Pt denies any associated ear pain.   Past Medical History  Diagnosis Date  . Chronic hepatitis B   . Traumatic brain injury   . Intracranial shunt   . Stroke   . Herniation of brain stem   . Paralysis   . Fixed pupils   . Hypertension   . Trouble swallowing   . Visual disturbance   . Weakness   . Intracranial injury of other and unspecified nature, without mention of open intracranial wound, unspecified state of consciousness   . Cerebral thrombosis with cerebral infarction   . Spastic hemiplegia affecting dominant side     Past Surgical History  Procedure Laterality Date  . Cramiectomy    . Ventriculoperitoneal shunt    . Cranioplasty      Family History  Problem Relation Age of Onset  . Diabetes    . Hyperlipidemia    . Hypertension      History  Substance Use Topics  . Smoking status: Never Smoker   . Smokeless tobacco: Never Used  . Alcohol Use: No      Review of Systems  Constitutional: Negative for fever and chills.  HENT: Positive for congestion,  sore throat, rhinorrhea and trouble swallowing. Negative for ear pain.   Respiratory: Positive for cough. Negative for shortness of breath.   Gastrointestinal: Negative for nausea and vomiting.  Neurological: Negative for weakness.  All other systems reviewed and are negative.    Allergies  Review of patient's allergies indicates no known allergies.  Home Medications   Current Outpatient Rx  Name  Route  Sig  Dispense  Refill  . Jevity 1.5 Cal (JEVITY 1.5) LIQD   Oral   Take 237 mLs by mouth 5 (five) times daily.         Marland Kitchen levETIRAcetam (KEPPRA) 100 MG/ML solution   Oral   Take 500 mg by mouth 2 (two) times daily.         . Multiple Vitamin (MULTIVITAMIN) tablet   Oral   Take 1 tablet by mouth daily.         . Probiotic Product (PROBIOTIC FORMULA PO)   Oral   Take 1 capsule by mouth daily.          Marland Kitchen senna (SENOKOT) 8.6 MG tablet   Oral   Take 1 tablet by mouth 2 (two) times daily.           Triage Vitals: BP 103/60  Pulse 94  Temp(Src) 97.7 F (36.5 C) (Oral)  Resp 18  SpO2 97%  Physical Exam  Nursing note and vitals reviewed. Constitutional: He  is oriented to person, place, and time. He appears well-developed and well-nourished. No distress.  HENT:  Head: Normocephalic and atraumatic.  Right Ear: External ear normal.  Left Ear: External ear normal.  Nose: Nose normal.  Eyes: Conjunctivae are normal.  Neck: Normal range of motion. No tracheal deviation present.  Cardiovascular: Normal rate, regular rhythm and normal heart sounds.   Pulmonary/Chest: Effort normal and breath sounds normal. No stridor.  Coarse breath sounds.  Abdominal: Soft. He exhibits no distension. There is no tenderness.  Musculoskeletal: Normal range of motion.  Neurological: He is alert and oriented to person, place, and time.  Skin: Skin is warm and dry. He is not diaphoretic.  Psychiatric: He has a normal mood and affect. His behavior is normal.    ED Course   Procedures (including critical care time) DIAGNOSTIC STUDIES: Oxygen Saturation is 97% on room air, adequate by my interpretation.    COORDINATION OF CARE: 20:01--I evaluated the patient and we discussed a treatment plan including chest x-ray to which the pt agreed.    Labs Reviewed - No data to display Dg Chest 2 View  11/12/2012  *RADIOLOGY REPORT*  Clinical Data: Shortness of breath and congestion  CHEST - 2 VIEW  Comparison: 09/22/2012  Findings: IVC filter noted.  Contiguous aspects of visualized right- sided shunt catheter tubing. Heart size is normal.  The lungs are clear.  Normal visualized bowel gas pattern.  No pleural effusion. Mild stool volume noted throughout nondilated visualized aspects of colon.  IMPRESSION: No acute cardiopulmonary process.   Original Report Authenticated By: Christiana Pellant, M.D.      1. Allergic rhinitis       MDM  Patient with history of pontine stroke and is high risk for aspiration. There appears to have been no aspiration episode. Chest x-ray clear. Afebrile. Currently in NAD. I believe this is allergic rhinitis. Flonase given as well as liquid Claritin. Dr. Effie Shy  evaluated patient and agrees with plan. Follow up with PCP next week. Strict return instructions given. Vital signs stable for discharge. Patient / Family / Caregiver informed of clinical course, understand medical decision-making process, and agree with plan.       I personally performed the services described in this documentation, which was scribed in my presence. The recorded information has been reviewed and is accurate.    Mora Bellman, PA-C 11/13/12 1556

## 2012-11-12 NOTE — ED Notes (Signed)
Pt family states understanding of discharge instructions

## 2012-11-13 NOTE — ED Provider Notes (Signed)
Paul Kane is a 27 y.o. male has a problem with postnasal drip that is ongoing. There has been no episodes of aspiration. His symptoms seem worse, during this pollen season. There's been no fever, chills, or nausea.  Exam alert, calm, cooperative. He attempts to answer some questions. He is not in respiratory distress.  Assessment: Allergic rhinitis, with postnasal drip. He is stable for discharge.  Medical screening examination/treatment/procedure(s) were conducted as a shared visit with non-physician practitioner(s) and myself.  I personally evaluated the patient during the encounter  Flint Melter, MD 11/13/12 [phone]

## 2012-11-28 ENCOUNTER — Emergency Department (HOSPITAL_COMMUNITY)
Admission: EM | Admit: 2012-11-28 | Discharge: 2012-11-28 | Disposition: A | Discharge status: Home / Self Care | Payer: BC Managed Care – PPO | Attending: Emergency Medicine | Admitting: Emergency Medicine

## 2012-11-28 ENCOUNTER — Emergency Department (HOSPITAL_COMMUNITY): Payer: BC Managed Care – PPO

## 2012-11-28 DIAGNOSIS — R059 Cough, unspecified: Secondary | ICD-10-CM | POA: Insufficient documentation

## 2012-11-28 DIAGNOSIS — I10 Essential (primary) hypertension: Secondary | ICD-10-CM | POA: Insufficient documentation

## 2012-11-28 DIAGNOSIS — R6883 Chills (without fever): Secondary | ICD-10-CM | POA: Insufficient documentation

## 2012-11-28 DIAGNOSIS — R05 Cough: Secondary | ICD-10-CM | POA: Insufficient documentation

## 2012-11-28 DIAGNOSIS — Z8669 Personal history of other diseases of the nervous system and sense organs: Secondary | ICD-10-CM | POA: Insufficient documentation

## 2012-11-28 DIAGNOSIS — J189 Pneumonia, unspecified organism: Secondary | ICD-10-CM | POA: Insufficient documentation

## 2012-11-28 DIAGNOSIS — Z8619 Personal history of other infectious and parasitic diseases: Secondary | ICD-10-CM | POA: Insufficient documentation

## 2012-11-28 DIAGNOSIS — Z79899 Other long term (current) drug therapy: Secondary | ICD-10-CM | POA: Insufficient documentation

## 2012-11-28 DIAGNOSIS — J3489 Other specified disorders of nose and nasal sinuses: Secondary | ICD-10-CM | POA: Insufficient documentation

## 2012-11-28 DIAGNOSIS — Z982 Presence of cerebrospinal fluid drainage device: Secondary | ICD-10-CM | POA: Insufficient documentation

## 2012-11-28 DIAGNOSIS — Z87828 Personal history of other (healed) physical injury and trauma: Secondary | ICD-10-CM | POA: Insufficient documentation

## 2012-11-28 DIAGNOSIS — Z87820 Personal history of traumatic brain injury: Secondary | ICD-10-CM | POA: Insufficient documentation

## 2012-11-28 DIAGNOSIS — Z8673 Personal history of transient ischemic attack (TIA), and cerebral infarction without residual deficits: Secondary | ICD-10-CM | POA: Insufficient documentation

## 2012-11-28 LAB — CBC
HCT: 38.9 % — ABNORMAL LOW (ref 39.0–52.0)
Hemoglobin: 13.1 g/dL (ref 13.0–17.0)
MCV: 92 fL (ref 78.0–100.0)
RDW: 12.1 % (ref 11.5–15.5)
WBC: 13.4 10*3/uL — ABNORMAL HIGH (ref 4.0–10.5)

## 2012-11-28 LAB — BASIC METABOLIC PANEL
BUN: 15 mg/dL (ref 6–23)
CO2: 29 mEq/L (ref 19–32)
Chloride: 99 mEq/L (ref 96–112)
Creatinine, Ser: 0.64 mg/dL (ref 0.50–1.35)
Glucose, Bld: 98 mg/dL (ref 70–99)
Potassium: 3.6 mEq/L (ref 3.5–5.1)

## 2012-11-28 LAB — POCT I-STAT TROPONIN I

## 2012-11-28 MED ORDER — SODIUM CHLORIDE 0.9 % IV BOLUS (SEPSIS)
1000.0000 mL | Freq: Once | INTRAVENOUS | Status: AC
  Administered 2012-11-28: 1000 mL via INTRAVENOUS

## 2012-11-28 MED ORDER — ACETAMINOPHEN 160 MG/5ML PO SOLN
650.0000 mg | Freq: Once | ORAL | Status: AC
  Administered 2012-11-28: 650 mg
  Filled 2012-11-28: qty 20.3

## 2012-11-28 MED ORDER — LEVOFLOXACIN 750 MG PO TABS: 750.0000 mg | ORAL_TABLET | Freq: Every day | ORAL | Status: DC

## 2012-11-28 MED ORDER — LEVOFLOXACIN IN D5W 750 MG/150ML IV SOLN
750.0000 mg | INTRAVENOUS | Status: DC
  Administered 2012-11-28: 750 mg via INTRAVENOUS
  Filled 2012-11-28: qty 150

## 2012-11-28 NOTE — ED Provider Notes (Signed)
Medical screening examination/treatment/procedure(s) were performed by non-physician practitioner and as supervising physician I was immediately available for consultation/collaboration.   Maurio Baize B. Bernette Mayers, MD 11/28/12 0600

## 2012-11-28 NOTE — ED Notes (Signed)
Pt has had SOB since yesterday and non productive cough with chest tightness

## 2012-11-28 NOTE — ED Notes (Signed)
Pt back top room in w/c, alert, NAD, calm, eyes open, tracking, family with pt.

## 2012-11-28 NOTE — ED Provider Notes (Signed)
History     CSN: [id number]  Arrival date & time 11/28/12  0101   First MD Initiated Contact with Patient 11/28/12 0315      Chief Complaint  Patient presents with  . Shortness of Breath   HPI  History provided by patient's parents and family. 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 Patient has had some nasal congestion and allergy-type symptoms for the past week or more. He has generally been well otherwise occasional slight coughing. Yesterday and all day today today patient had worsening cough and congestion sounds. Parents were concerned of difficulties breathing with congestion in the upper airways and throat. Patient also had chills in the evening. They do not believe he has had any fever but did not check temperature. Patient generally feeds through the G-tube but occasionally gets blended foods by mouth. He does have a past history of aspiration. They deny any other associated symptoms. No episodes of vomiting. No diarrhea. No other aggravating or alleviating factors.    Past Medical History  Diagnosis Date  . Chronic hepatitis B   . Traumatic brain injury   . Intracranial shunt   . Stroke   . Herniation of brain stem   . Paralysis   . Fixed pupils   . Hypertension   . Trouble swallowing   . Visual disturbance   . Weakness   . Intracranial injury of other and unspecified nature, without mention of open intracranial wound, unspecified state of consciousness   . Cerebral thrombosis with cerebral infarction   . Spastic hemiplegia affecting dominant side     Past Surgical History  Procedure Laterality Date  . Cramiectomy    . Ventriculoperitoneal shunt    . Cranioplasty      Family History  Problem Relation Age of Onset  . Diabetes    . Hyperlipidemia    . Hypertension      History  Substance Use Topics   . Smoking status: Never Smoker   . Smokeless tobacco: Never Used  . Alcohol Use: No      Review of Systems  Constitutional: Positive for chills.  Respiratory: Positive for cough.   Gastrointestinal: Negative for vomiting and diarrhea.  All other systems reviewed and are negative.    Allergies  Review of patient's allergies indicates no known allergies.  Home Medications   Current Outpatient Rx  Name  Route  Sig  Dispense  Refill  . azelastine (ASTELIN) 137 MCG/SPRAY nasal spray   Nasal   Place 1 spray into the nose 2 (two) times daily. Use in each nostril as directed         . fluticasone (FLONASE) 50 MCG/ACT nasal spray   Nasal   Place 2 sprays into the nose daily.   16 g   0   . Jevity 1.5 Cal (JEVITY 1.5) LIQD   Oral   Take 237 mLs by mouth 5 (five) times daily.         Marland Kitchen levETIRAcetam (KEPPRA) 100 MG/ML solution   Oral   Take 500 mg by mouth 2 (two) times daily.         Marland Kitchen loratadine (CLARITIN) 5 MG/5ML syrup   Oral   Take 10 mLs (10 mg total) by mouth daily.   150 mL   12   . Multiple Vitamin (MULTIVITAMIN) tablet   Oral   Take 1 tablet by mouth  daily.         . Probiotic Product (PROBIOTIC FORMULA PO)   Oral   Take 1 capsule by mouth daily.          Marland Kitchen senna (SENOKOT) 8.6 MG tablet   Oral   Take 1 tablet by mouth 2 (two) times daily.           BP 113/70  Pulse 110  Temp(Src) 101.5 F (38.6 C) (Oral)  Resp 18  SpO2 100%  Physical Exam  Nursing note and vitals reviewed. Constitutional: He appears well-developed and well-nourished. No distress.  HENT:  Head: Normocephalic.  Mouth/Throat: Oropharynx is clear and moist.  Cardiovascular: Normal rate and regular rhythm.   Pulmonary/Chest: Effort normal. No respiratory distress. He has no wheezes. He has rales. He exhibits no tenderness.  Abdominal: Soft. There is no tenderness. There is no rebound.  Musculoskeletal: Normal range of motion. He exhibits no edema and no tenderness.   Neurological: He is alert.  Psychiatric: He has a normal mood and affect. His behavior is normal.    ED Course  Procedures   Results for orders placed during the hospital encounter of 11/28/12  CBC      Result Value Range   WBC 13.4 (*) 4.0 - 10.5 K/uL   RBC 4.23  4.22 - 5.81 MIL/uL   Hemoglobin 13.1  13.0 - 17.0 g/dL   HCT 16.1 (*) 09.6 - 04.5 %   MCV 92.0  78.0 - 100.0 fL   MCH 31.0  26.0 - 34.0 pg   MCHC 33.7  30.0 - 36.0 g/dL   RDW 40.9  81.1 - 91.4 %   Platelets 173  150 - 400 K/uL  BASIC METABOLIC PANEL      Result Value Range   Sodium 137  135 - 145 mEq/L   Potassium 3.6  3.5 - 5.1 mEq/L   Chloride 99  96 - 112 mEq/L   CO2 29  19 - 32 mEq/L   Glucose, Bld 98  70 - 99 mg/dL   BUN 15  6 - 23 mg/dL   Creatinine, Ser 7.82  0.50 - 1.35 mg/dL   Calcium 9.9  8.4 - 95.6 mg/dL   GFR calc non Af Amer >90  >90 mL/min   GFR calc Af Amer >90  >90 mL/min  PRO B NATRIURETIC PEPTIDE      Result Value Range   Pro B Natriuretic peptide (BNP) 57.2  0 - 125 pg/mL  POCT I-STAT TROPONIN I      Result Value Range   Troponin i, poc 0.00  0.00 - 0.08 ng/mL   Comment 3               Dg Chest 2 View  11/28/2012  *RADIOLOGY REPORT*  Clinical Data: Shortness of breath  CHEST - 2 VIEW  Comparison: 11/12/2012  Findings: Mild right lung base opacity has developed in the interval.  Hypoaeration.  Mild central peribronchial thickening has also developed.  No pleural effusion or pneumothorax. Cardiomediastinal contours are mildly prominent though likely accentuated by hypoaeration.  Partially imaged VP shunt catheter projects over the right hemithorax anteriorly.  No acute osseous finding.  IMPRESSION: Hypoaeration with mild peribronchial thickening, may reflect bronchitis.  Right lower lobe opacity has developed in the interval and may reflect pneumonia or atelectasis.   Original Report Authenticated By: Jearld Lesch, M.D.      1. CAP (community acquired pneumonia)       MDM  Patient  seen and evaluated. Patient sitting appears comfortable in bed in no acute distress. Normal respirations and O2 sats on room air.  Patient discussed with attending physician. Will offer to family admission for continued treatment.  Patient's heart rate improving with fluids. I discussed with him patient's family option for staying in the hospital to be admitted with close monitoring continued treatment of his pneumonia infection. Patient's family wishes to return home and try outpatient therapy first. Patient generally appears well and nontoxic. Patient has normal respirations and as not hypoxic. At this time it is reasonable to discharge home with outpatient trial. Family given strict return precautions.     Angus Seller, PA-C 11/28/12 [phone]

## 2012-11-28 NOTE — ED Notes (Signed)
EDPA at [facility]. Family x3 at [facility].

## 2012-12-05 ENCOUNTER — Ambulatory Visit (INDEPENDENT_AMBULATORY_CARE_PROVIDER_SITE_OTHER): Payer: BC Managed Care – PPO | Admitting: Family

## 2012-12-05 ENCOUNTER — Encounter: Payer: Self-pay | Admitting: Family

## 2012-12-05 VITALS — BP 100/60 | HR 100 | Temp 97.6°F | Wt 152.0 lb

## 2012-12-05 DIAGNOSIS — J309 Allergic rhinitis, unspecified: Secondary | ICD-10-CM

## 2012-12-05 DIAGNOSIS — J189 Pneumonia, unspecified organism: Secondary | ICD-10-CM

## 2012-12-05 NOTE — Patient Instructions (Addendum)
Have a Chest Xray in 1 month at [place] office, [address] [place].   Pneumonia, Adult Pneumonia is an infection of the lungs.  CAUSES Pneumonia may be caused by bacteria or a virus. Usually, these infections are caused by breathing infectious particles into the lungs (respiratory tract). SYMPTOMS   Cough.  Fever.  Chest pain.  Increased rate of breathing.  Wheezing.  Mucus production. DIAGNOSIS  If you have the common symptoms of pneumonia, your caregiver will typically confirm the diagnosis with a chest X-ray. The X-ray will show an abnormality in the lung (pulmonary infiltrate) if you have pneumonia. Other tests of your blood, urine, or sputum may be done to find the specific cause of your pneumonia. Your caregiver may also do tests (blood gases or pulse oximetry) to see how well your lungs are working. TREATMENT  Some forms of pneumonia may be spread to other people when you cough or sneeze. You may be asked to wear a mask before and during your exam. Pneumonia that is caused by bacteria is treated with antibiotic medicine. Pneumonia that is caused by the influenza virus may be treated with an antiviral medicine. Most other viral infections must run their course. These infections will not respond to antibiotics.  PREVENTION A pneumococcal shot (vaccine) is available to prevent a common bacterial cause of pneumonia. This is usually suggested for:  People over 68 years old.  Patients on chemotherapy.  People with chronic lung problems, such as bronchitis or emphysema.  People with immune system problems. If you are over 65 or have a high risk condition, you may receive the pneumococcal vaccine if you have not received it before. In some countries, a routine influenza vaccine is also recommended. This vaccine can help prevent some cases of pneumonia.You may be offered the influenza vaccine as part of your care. If you smoke, it is time to quit. You may receive instructions on how to  stop smoking. Your caregiver can provide medicines and counseling to help you quit. HOME CARE INSTRUCTIONS   Cough suppressants may be used if you are losing too much rest. However, coughing protects you by clearing your lungs. You should avoid using cough suppressants if you can.  Your caregiver may have prescribed medicine if he or she thinks your pneumonia is caused by a bacteria or influenza. Finish your medicine even if you start to feel better.  Your caregiver may also prescribe an expectorant. This loosens the mucus to be coughed up.  Only take over-the-counter or prescription medicines for pain, discomfort, or fever as directed by your caregiver.  Do not smoke. Smoking is a common cause of bronchitis and can contribute to pneumonia. If you are a smoker and continue to smoke, your cough may last several weeks after your pneumonia has cleared.  A cold steam vaporizer or humidifier in your room or home may help loosen mucus.  Coughing is often worse at night. Sleeping in a semi-upright position in a recliner or using a couple pillows under your head will help with this.  Get rest as you feel it is needed. Your body will usually let you know when you need to rest. SEEK IMMEDIATE MEDICAL CARE IF:   Your illness becomes worse. This is especially true if you are elderly or weakened from any other disease.  You cannot control your cough with suppressants and are losing sleep.  You begin coughing up blood.  You develop pain which is getting worse or is uncontrolled with medicines.  You have a fever.  Any of the symptoms which initially brought you in for treatment are getting worse rather than better.  You develop shortness of breath or chest pain. MAKE SURE YOU:   Understand these instructions.  Will watch your condition.  Will get help right away if you are not doing well or get worse. Document Released: 07/06/2005 Document Revised: 09/28/2011 Document Reviewed:  09/25/2010 [facility] Patient Information 2013 [place], [place].

## 2012-12-05 NOTE — Progress Notes (Signed)
Subjective:    Patient ID: Paul Kane, male    DOB: 09-28-1985, 27 y.o.   MRN: [id number]  HPI 27 year old white male, nonsmoker, patient of Dr. Lovell Sheehan is in today as an emergency department followup from 11/28/2012. He presented to the emergency department with cough, fever, congestion and fatigue. He was diagnosed as having a right lower lobe pneumonia and was given antibiotics in the emergency department as well as antibiotic to continue at home. Since that time, his symptoms have improved significantly. His appetite has increased. He has less fatigue. Has a history of allergic rhinitis and takes Claritin once a day and tolerates it well.   Review of Systems  Constitutional: Negative.   HENT: Negative.   Respiratory: Negative.   Cardiovascular: Negative.   Gastrointestinal: Negative.   Endocrine: Negative.   Musculoskeletal: Negative.   Skin: Negative.   Allergic/Immunologic: Negative.   Neurological: Negative.   Psychiatric/Behavioral: Negative.    Past Medical History  Diagnosis Date  . Chronic hepatitis B   . Traumatic brain injury   . Intracranial shunt   . Stroke   . Herniation of brain stem   . Paralysis   . Fixed pupils   . Hypertension   . Trouble swallowing   . Visual disturbance   . Weakness   . Intracranial injury of other and unspecified nature, without mention of open intracranial wound, unspecified state of consciousness   . Cerebral thrombosis with cerebral infarction   . Spastic hemiplegia affecting dominant side     History   Social History  . Marital Status: Single    Spouse Name: N/A    Number of Children: N/A  . Years of Education: N/A   Occupational History  . Not on file.   Social History Main Topics  . Smoking status: Never Smoker   . Smokeless tobacco: Never Used  . Alcohol Use: No  . Drug Use: No  . Sexually Active: Not on file   Other Topics Concern  . Not on file   Social History Narrative  . No narrative on file     Past Surgical History  Procedure Laterality Date  . Cramiectomy    . Ventriculoperitoneal shunt    . Cranioplasty      Family History  Problem Relation Age of Onset  . Diabetes    . Hyperlipidemia    . Hypertension      No Known Allergies  Current Outpatient Prescriptions on File Prior to Visit  Medication Sig Dispense Refill  . azelastine (ASTELIN) 137 MCG/SPRAY nasal spray Place 1 spray into the nose 2 (two) times daily. Use in each nostril as directed      . Jevity 1.5 Cal (JEVITY 1.5) LIQD Take 237 mLs by mouth 5 (five) times daily.      Marland Kitchen levETIRAcetam (KEPPRA) 100 MG/ML solution Take 500 mg by mouth 2 (two) times daily.      Marland Kitchen levofloxacin (LEVAQUIN) 750 MG tablet Take 1 tablet (750 mg total) by mouth daily.  7 tablet  0  . Multiple Vitamin (MULTIVITAMIN) tablet Take 1 tablet by mouth daily.      . Probiotic Product (PROBIOTIC FORMULA PO) Take 1 capsule by mouth daily.       Marland Kitchen senna (SENOKOT) 8.6 MG tablet Take 1 tablet by mouth 2 (two) times daily as needed.       . fluticasone (FLONASE) 50 MCG/ACT nasal spray Place 2 sprays into the nose daily.  16 g  0  . loratadine (  CLARITIN) 5 MG/5ML syrup Take 10 mLs (10 mg total) by mouth daily.  150 mL  12   No current facility-administered medications on file prior to visit.    BP 100/60  Pulse 100  Temp(Src) 97.6 F (36.4 C) (Oral)  Wt 152 lb (68.947 kg)  BMI 23.8 kg/m2  SpO2 98%chart    Objective:   Physical Exam  Constitutional: He is oriented to person, place, and time. He appears well-developed and well-nourished.  HENT:  Right Ear: External ear normal.  Left Ear: External ear normal.  Nose: Nose normal.  Mouth/Throat: Oropharynx is clear and moist.  Neck: Normal range of motion. Neck supple.  Cardiovascular: Normal rate, regular rhythm and normal heart sounds.   Pulmonary/Chest: Effort normal and breath sounds normal.  Musculoskeletal: Normal range of motion.  Neurological: He is alert and oriented to  person, place, and time.  Skin: Skin is warm and dry.  Psychiatric: He has a normal mood and affect.          Assessment & Plan:  Assessment:  1. Community-acquired pneumonia-improving  2. Allergic rhinitis  Plan: Complete antibiotics. In 4 weeks he will go to the he will office to have a repeat chest x-ray to be sure that the pneumonia has cleared. Drink plenty of fluids. Rest. Call the office with any questions or concerns. Recheck as scheduled, and as needed.

## 2012-12-08 ENCOUNTER — Encounter (HOSPITAL_COMMUNITY): Payer: Self-pay | Admitting: Emergency Medicine

## 2012-12-08 ENCOUNTER — Emergency Department (HOSPITAL_COMMUNITY)
Admission: EM | Admit: 2012-12-08 | Discharge: 2012-12-08 | Disposition: A | Discharge status: Home / Self Care | Payer: BC Managed Care – PPO | Attending: Emergency Medicine | Admitting: Emergency Medicine

## 2012-12-08 ENCOUNTER — Emergency Department (HOSPITAL_COMMUNITY): Payer: BC Managed Care – PPO

## 2012-12-08 DIAGNOSIS — S0990XA Unspecified injury of head, initial encounter: Secondary | ICD-10-CM | POA: Insufficient documentation

## 2012-12-08 DIAGNOSIS — Z982 Presence of cerebrospinal fluid drainage device: Secondary | ICD-10-CM | POA: Insufficient documentation

## 2012-12-08 DIAGNOSIS — W19XXXA Unspecified fall, initial encounter: Secondary | ICD-10-CM

## 2012-12-08 DIAGNOSIS — S0993XA Unspecified injury of face, initial encounter: Secondary | ICD-10-CM | POA: Insufficient documentation

## 2012-12-08 DIAGNOSIS — I10 Essential (primary) hypertension: Secondary | ICD-10-CM | POA: Insufficient documentation

## 2012-12-08 DIAGNOSIS — Y9229 Other specified public building as the place of occurrence of the external cause: Secondary | ICD-10-CM | POA: Insufficient documentation

## 2012-12-08 DIAGNOSIS — S199XXA Unspecified injury of neck, initial encounter: Secondary | ICD-10-CM | POA: Insufficient documentation

## 2012-12-08 DIAGNOSIS — W010XXA Fall on same level from slipping, tripping and stumbling without subsequent striking against object, initial encounter: Secondary | ICD-10-CM | POA: Insufficient documentation

## 2012-12-08 DIAGNOSIS — R569 Unspecified convulsions: Secondary | ICD-10-CM | POA: Insufficient documentation

## 2012-12-08 DIAGNOSIS — G8110 Spastic hemiplegia affecting unspecified side: Secondary | ICD-10-CM | POA: Insufficient documentation

## 2012-12-08 DIAGNOSIS — Y998 Other external cause status: Secondary | ICD-10-CM | POA: Insufficient documentation

## 2012-12-08 DIAGNOSIS — Z79899 Other long term (current) drug therapy: Secondary | ICD-10-CM | POA: Insufficient documentation

## 2012-12-08 DIAGNOSIS — R1310 Dysphagia, unspecified: Secondary | ICD-10-CM | POA: Insufficient documentation

## 2012-12-08 DIAGNOSIS — Z87820 Personal history of traumatic brain injury: Secondary | ICD-10-CM | POA: Insufficient documentation

## 2012-12-08 DIAGNOSIS — R5381 Other malaise: Secondary | ICD-10-CM | POA: Insufficient documentation

## 2012-12-08 LAB — GLUCOSE, CAPILLARY: Glucose-Capillary: 97 mg/dL (ref 70–99)

## 2012-12-08 MED ORDER — MAGIC MOUTHWASH
5.0000 mL | Freq: Once | ORAL | Status: DC
  Filled 2012-12-08: qty 5

## 2012-12-08 NOTE — ED Provider Notes (Signed)
History     CSN: [id number]  Arrival date & time 12/08/12  1650   First MD Initiated Contact with Patient 12/08/12 1658      Chief Complaint  Patient presents with  . Fall  . 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 Patient's last seizure was one year ago. Patient was at physical therapy today when he accidentally tripped over his feet, fell forward hitting face against the floor.  The fall was witnessed.  No LOC.  While pt was travelling home in the car with his parent, his mom noticed he appear to spaced out for 3-5 seconds, but quickly regain conscious without postictal state.  Since pt has bleeding in his mouth, and ?seizure episode, parent call EMS to bring pt to ER for further evaluation.  Pt currently at baseline.  He has no specific complaint, does recall tripping over his foot and fell.  Denies headache, nose pain, mouth pain, neck pain, or have any other complaint.  Did not eat breakfast this morning.  Otherwise has been taking medication as prescribed.    Past Medical History  Diagnosis Date  . Chronic hepatitis B   . Traumatic brain injury   . Intracranial shunt   . Stroke   . Herniation of brain stem   . Paralysis   . Fixed pupils   . Hypertension   . Trouble swallowing   . Visual disturbance   . Weakness   . Intracranial injury of other and unspecified nature, without mention of open intracranial wound, unspecified state of consciousness   . Cerebral thrombosis with cerebral infarction   . Spastic hemiplegia affecting dominant side     Past Surgical History  Procedure Laterality Date  . Cramiectomy    . Ventriculoperitoneal shunt    . Cranioplasty      Family History  Problem Relation Age of Onset  .  Diabetes    . Hyperlipidemia    . Hypertension      History  Substance Use Topics  . Smoking status: Never Smoker   . Smokeless tobacco: Never Used  . Alcohol Use: No      Review of Systems  Constitutional: Negative for fever.  HENT: Positive for dental problem. Negative for mouth sores and neck pain.   Respiratory: Negative for shortness of breath.   Cardiovascular: Negative for chest pain.  Gastrointestinal: Negative for nausea and abdominal pain.  Neurological: Negative for headaches.  Psychiatric/Behavioral: Negative for confusion.    Allergies  Review of patient's allergies indicates no known allergies.  Home Medications   Current Outpatient Rx  Name  Route  Sig  Dispense  Refill  . azelastine (ASTELIN) 137 MCG/SPRAY nasal spray   Nasal   Place 1 spray into the nose 2 (two) times daily. Use in each nostril as directed         . fluticasone (FLONASE) 50 MCG/ACT nasal spray   Nasal   Place 2 sprays into the nose daily.   16 g   0   . Jevity 1.5 Cal (JEVITY 1.5) LIQD   Oral   Take 237 mLs by mouth 5 (five) times daily.         Marland Kitchen levETIRAcetam (KEPPRA) 100 MG/ML solution   Oral   Take 500 mg by mouth 2 (two) times daily.         Marland Kitchen  levofloxacin (LEVAQUIN) 750 MG tablet   Oral   Take 1 tablet (750 mg total) by mouth daily.   7 tablet   0   . loratadine (CLARITIN) 5 MG/5ML syrup   Oral   Take 10 mLs (10 mg total) by mouth daily.   150 mL   12   . Multiple Vitamin (MULTIVITAMIN) tablet   Oral   Take 1 tablet by mouth daily.         . Probiotic Product (PROBIOTIC FORMULA PO)   Oral   Take 1 capsule by mouth daily.          Marland Kitchen senna (SENOKOT) 8.6 MG tablet   Oral   Take 1 tablet by mouth 2 (two) times daily as needed.            There were no vitals taken for this visit.  Physical Exam  Nursing note and vitals reviewed. Constitutional: He is oriented to person, place, and time. He appears well-developed and well-nourished. No  distress.  HENT:  Forehead: mild erythema noted to R forehead, no crepitus or deformity  Ear: no hemotympanum  Nose: no septal hematoma  Mouth: mild bleeding noted to R upper central incisor and R canine, mildly loosening without obvious intrusion/extrusion or avulsion fx.  Mild tenderness.    No tongue biting, no trismus, no malocclusion.  Eyes: Conjunctivae and EOM are normal. Pupils are equal, round, and reactive to light.  Neck: Normal range of motion. Neck supple.  Cardiovascular: Normal heart sounds.   Pulmonary/Chest: Effort normal and breath sounds normal.  Abdominal: Soft.  Musculoskeletal: He exhibits no tenderness.  Neurological: He is alert and oriented to person, place, and time.  Minimal speech, slurring of speech (chronic and at baseline) able to answer all simple questions appropriately    ED Course  Procedures (including critical care time)   Date: 12/08/2012  Rate: 79  Rhythm: normal sinus rhythm  QRS Axis: normal  Intervals: normal  ST/T Wave abnormalities: normal  Conduction Disutrbances:none  Narrative Interpretation:   Old EKG Reviewed: unchanged  5:21 PM Pt had mechanical fall with mouth injury.  No other obvious injury.  Doubt seizure.  However, parent is concern, therefore will obtain head CT, and maxillofacial CT.  Will check cbg.  Otherwise pt appears to be at baseline.    7:33 PM Head and maxillofacial CT shows no acute fractures or dislocation. No abnormal bleeding. Normal blood sugar. Patient is currently at baseline. Result discussed with pt and with family, family expressed gratitude.  Pt does have dentist, i recommend f/u with dentist for further evaluation for possible dental injury.  Otherwise, pt stable for discharge.  Return precaution discussed.   Labs Reviewed  GLUCOSE, CAPILLARY   Ct Head Wo Contrast  12/08/2012   *RADIOLOGY REPORT*  Clinical Data:  Fall.  Head injury.  Seizure.  Facial bruising and pain.  CT HEAD WITHOUT CONTRAST CT  MAXILLOFACIAL WITHOUT CONTRAST  Technique:  Multidetector CT imaging of the head and maxillofacial structures were performed using the standard protocol without intravenous contrast. Multiplanar CT image reconstructions of the maxillofacial structures were also generated.  Comparison:  Head CT on 05/07/2011  CT HEAD  Findings: Right frontal ventriculostomy is again seen with tip in the region of the third ventricle.  Ventricles are stable in size. Old left craniotomy defect also noted.  Chronic bilateral frontal and left temporal encephalomalacia remains stable.  A There is no evidence of intracranial hemorrhage, brain edema or other signs of acute  infarction.  There is no evidence of intracranial mass lesion or mass effect.  No abnormal extra-axial fluid collections are identified.  No evidence of acute skull fracture.  IMPRESSION:  1.  No acute intracranial findings. 2.  Stable cardiomegaly with ventriculostomy in stable position. 3.  Chronic bilateral frontal and left temporal lobe encephalomalacia.  CT MAXILLOFACIAL  Findings:   No evidence of orbital or facial bone fracture.  Globes and other intraorbital anatomy are normal appearance.  No evidence of orbital emphysema or sinus air fluid levels.  Incidental note is made of left sided concha bullosa and nasal septal deviation to the right.  IMPRESSION: No evidence of orbital or facial bone fracture.   Original Report Authenticated By: Myles Rosenthal, M.D.   Ct Maxillofacial Wo Cm  12/08/2012   *RADIOLOGY REPORT*  Clinical Data:  Fall.  Head injury.  Seizure.  Facial bruising and pain.  CT HEAD WITHOUT CONTRAST CT MAXILLOFACIAL WITHOUT CONTRAST  Technique:  Multidetector CT imaging of the head and maxillofacial structures were performed using the standard protocol without intravenous contrast. Multiplanar CT image reconstructions of the maxillofacial structures were also generated.  Comparison:  Head CT on 05/07/2011  CT HEAD  Findings: Right frontal  ventriculostomy is again seen with tip in the region of the third ventricle.  Ventricles are stable in size. Old left craniotomy defect also noted.  Chronic bilateral frontal and left temporal encephalomalacia remains stable.  A There is no evidence of intracranial hemorrhage, brain edema or other signs of acute infarction.  There is no evidence of intracranial mass lesion or mass effect.  No abnormal extra-axial fluid collections are identified.  No evidence of acute skull fracture.  IMPRESSION:  1.  No acute intracranial findings. 2.  Stable cardiomegaly with ventriculostomy in stable position. 3.  Chronic bilateral frontal and left temporal lobe encephalomalacia.  CT MAXILLOFACIAL  Findings:   No evidence of orbital or facial bone fracture.  Globes and other intraorbital anatomy are normal appearance.  No evidence of orbital emphysema or sinus air fluid levels.  Incidental note is made of left sided concha bullosa and nasal septal deviation to the right.  IMPRESSION: No evidence of orbital or facial bone fracture.   Original Report Authenticated By: Myles Rosenthal, M.D.     1. Fall, initial encounter   2. Minor head injury without loss of consciousness, initial encounter       MDM  BP 110/70  Resp 18  SpO2 100%  I have reviewed nursing notes and vital signs. I personally reviewed the imaging tests through PACS system  I reviewed available ER/hospitalization records thought the EMR         Fayrene Helper, [place] 12/08/12 1955

## 2012-12-08 NOTE — ED Notes (Signed)
Pt to ED via [facility] for evaluation of a seizure.  Pt has hx of TBI from 3 years ago, hx of seizures since TBI, last seizure was over 1 year ago- pt is on Keppra for seizure control.  Pt was at physical therapy today when his feet got tangled and pt fell forward onto his head, injury to nose and inside of mouth.  Family is concerned because pt has a shunt in the front of his brain from TBI.  Pt at baseline per family.

## 2012-12-08 NOTE — ED Provider Notes (Signed)
Medical screening examination/treatment/procedure(s) were performed by non-physician practitioner and as supervising physician I was immediately available for consultation/collaboration. Devoria Albe, MD, Armando Gang   Ward Givens, MD 12/08/12 [phone]

## 2013-01-02 ENCOUNTER — Telehealth: Payer: Self-pay | Admitting: Physical Medicine & Rehabilitation

## 2013-01-02 NOTE — Telephone Encounter (Signed)
Yes, that's ok.

## 2013-01-02 NOTE — Telephone Encounter (Signed)
Patient is requesting another Botox injection.  He last had one on March 18.  He has one final injection approved through his insurance, so I scheduled him for 01/09/13.  Is this OK?

## 2013-01-04 ENCOUNTER — Encounter: Payer: Self-pay | Admitting: *Deleted

## 2013-01-04 ENCOUNTER — Telehealth: Payer: Self-pay | Admitting: Internal Medicine

## 2013-01-04 NOTE — Telephone Encounter (Signed)
Dad states son has a TBI. Wants approval letter from Dr Hervey Ard to participate in a program - [employer] - summer experience for TBI survivors. I gave Bonnye that handout. Please advise and call dad.

## 2013-01-04 NOTE — Telephone Encounter (Signed)
Letter is typed and dad informed it is ready for pick up

## 2013-01-09 ENCOUNTER — Encounter: Payer: Self-pay | Admitting: Physical Medicine & Rehabilitation

## 2013-01-09 ENCOUNTER — Encounter
Payer: BC Managed Care – PPO | Attending: Physical Medicine & Rehabilitation | Admitting: Physical Medicine & Rehabilitation

## 2013-01-09 VITALS — BP 97/59 | HR 80 | Resp 14 | Ht 67.0 in | Wt 152.0 lb

## 2013-01-09 DIAGNOSIS — G8110 Spastic hemiplegia affecting unspecified side: Secondary | ICD-10-CM | POA: Insufficient documentation

## 2013-01-09 NOTE — Progress Notes (Signed)
Botox Injection for spasticity using needle EMG guidance  Dilution: 100 Units/ml Indication: Severe spasticity which interferes with ADL,mobility and/or  hygiene and is unresponsive to medication management and other conservative care Informed consent was obtained after describing risks and benefits of the procedure with the patient. This includes bleeding, bruising, infection, excessive weakness, or medication side effects. A REMS form is on file and signed. Needle: 50mm injectable monopolar needle electrode Number of units per muscle Pectoralis 100 units Biceps 200 units also included brachialis FCR 0 FCU 25 u FDS 25 u FDP 50 u FPL u Tibialis anterior 100 u  All injections were done after obtaining appropriate EMG activity and after negative drawback for blood. The patient tolerated the procedure well. Post procedure instructions were given. A followup appointment was made.

## 2013-01-09 NOTE — Patient Instructions (Signed)
CALL ME WITH ANY PROBLEMS OR QUESTIONS (#[phone]).  HAVE A GOOD DAY  

## 2013-03-09 ENCOUNTER — Ambulatory Visit
Payer: BC Managed Care – PPO | Attending: Physical Medicine & Rehabilitation | Admitting: Rehabilitative and Restorative Service Providers"

## 2013-03-09 DIAGNOSIS — M629 Disorder of muscle, unspecified: Secondary | ICD-10-CM | POA: Insufficient documentation

## 2013-03-09 DIAGNOSIS — G8110 Spastic hemiplegia affecting unspecified side: Secondary | ICD-10-CM | POA: Insufficient documentation

## 2013-03-09 DIAGNOSIS — M2420 Disorder of ligament, unspecified site: Secondary | ICD-10-CM | POA: Insufficient documentation

## 2013-03-09 DIAGNOSIS — R269 Unspecified abnormalities of gait and mobility: Secondary | ICD-10-CM | POA: Insufficient documentation

## 2013-03-09 DIAGNOSIS — IMO0001 Reserved for inherently not codable concepts without codable children: Secondary | ICD-10-CM | POA: Insufficient documentation

## 2013-03-15 ENCOUNTER — Ambulatory Visit: Payer: BC Managed Care – PPO | Admitting: Rehabilitative and Restorative Service Providers"

## 2013-03-17 ENCOUNTER — Ambulatory Visit: Payer: BC Managed Care – PPO | Admitting: Rehabilitative and Restorative Service Providers"

## 2013-03-22 ENCOUNTER — Ambulatory Visit
Payer: BC Managed Care – PPO | Attending: Physical Medicine & Rehabilitation | Admitting: Rehabilitative and Restorative Service Providers"

## 2013-03-22 DIAGNOSIS — R269 Unspecified abnormalities of gait and mobility: Secondary | ICD-10-CM | POA: Insufficient documentation

## 2013-03-22 DIAGNOSIS — G8110 Spastic hemiplegia affecting unspecified side: Secondary | ICD-10-CM | POA: Insufficient documentation

## 2013-03-22 DIAGNOSIS — IMO0001 Reserved for inherently not codable concepts without codable children: Secondary | ICD-10-CM | POA: Insufficient documentation

## 2013-03-22 DIAGNOSIS — M2420 Disorder of ligament, unspecified site: Secondary | ICD-10-CM | POA: Insufficient documentation

## 2013-03-22 DIAGNOSIS — M629 Disorder of muscle, unspecified: Secondary | ICD-10-CM | POA: Insufficient documentation

## 2013-03-24 ENCOUNTER — Ambulatory Visit: Payer: BC Managed Care – PPO | Admitting: Rehabilitative and Restorative Service Providers"

## 2013-03-27 ENCOUNTER — Ambulatory Visit: Payer: BC Managed Care – PPO | Admitting: Rehabilitative and Restorative Service Providers"

## 2013-03-28 ENCOUNTER — Ambulatory Visit: Payer: BC Managed Care – PPO | Admitting: Rehabilitative and Restorative Service Providers"

## 2013-03-29 ENCOUNTER — Ambulatory Visit: Payer: BC Managed Care – PPO | Admitting: Rehabilitative and Restorative Service Providers"

## 2013-03-31 ENCOUNTER — Ambulatory Visit: Payer: BC Managed Care – PPO | Admitting: Rehabilitative and Restorative Service Providers"

## 2013-04-05 ENCOUNTER — Ambulatory Visit: Payer: BC Managed Care – PPO | Admitting: Rehabilitative and Restorative Service Providers"

## 2013-04-07 ENCOUNTER — Ambulatory Visit: Payer: BC Managed Care – PPO | Admitting: Rehabilitative and Restorative Service Providers"

## 2013-04-11 ENCOUNTER — Encounter: Payer: BC Managed Care – PPO | Admitting: Physical Medicine & Rehabilitation

## 2013-04-14 ENCOUNTER — Encounter: Payer: Self-pay | Admitting: Physical Medicine & Rehabilitation

## 2013-04-14 ENCOUNTER — Encounter
Payer: BC Managed Care – PPO | Attending: Physical Medicine & Rehabilitation | Admitting: Physical Medicine & Rehabilitation

## 2013-04-14 VITALS — BP 105/59 | HR 80 | Resp 14 | Ht 67.0 in | Wt 154.4 lb

## 2013-04-14 DIAGNOSIS — G8110 Spastic hemiplegia affecting unspecified side: Secondary | ICD-10-CM | POA: Insufficient documentation

## 2013-04-14 DIAGNOSIS — S069X0S Unspecified intracranial injury without loss of consciousness, sequela: Secondary | ICD-10-CM

## 2013-04-14 DIAGNOSIS — S069X9S Unspecified intracranial injury with loss of consciousness of unspecified duration, sequela: Secondary | ICD-10-CM

## 2013-04-14 NOTE — Patient Instructions (Signed)
CALL ME WITH ANY PROBLEMS OR QUESTIONS (#[phone]).  HAVE A GOOD DAY  

## 2013-04-14 NOTE — Progress Notes (Signed)
Subjective:    Patient ID: Paul Kane, male    DOB: 1986/04/13, 27 y.o.   MRN: [id number]  HPI  Paul Kane is back today regarding his TBI. He continues to make gradual progress with his spasticity. He is receiving OT at [facility] who is working on his LUE. We last performed botox in June. He is doing PT at [facility] outpt.  He is eating more and feeding himself. Often he receives no TF supplements at all.   His family continues to push him with exercise. He goes to the [facility] regularly.     Pain Inventory Average Pain 0 Pain Right Now 0 My pain is no pain  In the last 24 hours, has pain interfered with the following? General activity 0 Relation with others 0 Enjoyment of life 0 What TIME of day is your pain at its worst? no pain Sleep (in general) Good  Pain is worse with: no pain Pain improves with: no pain Relief from Meds: no pain  Mobility walk with assistance  Function I need assistance with the following:  dressing, bathing, toileting, meal prep, household duties and shopping  Neuro/Psych trouble walking  Prior Studies Any changes since last visit?  no  Physicians involved in your care Any changes since last visit?  no   Family History  Problem Relation Age of Onset  . Diabetes    . Hyperlipidemia    . Hypertension     History   Social History  . Marital Status: Single    Spouse Name: N/A    Number of Children: N/A  . Years of Education: N/A   Social History Main Topics  . Smoking status: Never Smoker   . Smokeless tobacco: Never Used  . Alcohol Use: No  . Drug Use: No  . Sexual Activity: None   Other Topics Concern  . None   Social History Narrative  . None   Past Surgical History  Procedure Laterality Date  . Cramiectomy    . Ventriculoperitoneal shunt    . Cranioplasty     Past Medical History  Diagnosis Date  . Chronic hepatitis B   . Traumatic brain injury   . Intracranial shunt   . Stroke   . Herniation of brain stem   . Paralysis    . Fixed pupils   . Hypertension   . Trouble swallowing   . Visual disturbance   . Weakness   . Intracranial injury of other and unspecified nature, without mention of open intracranial wound, unspecified state of consciousness   . Cerebral thrombosis with cerebral infarction   . Spastic hemiplegia affecting dominant side    BP 105/59  Pulse 80  Resp 14  Ht 5\' 7"  (1.702 m)  Wt 154 lb 6.4 oz (70.035 kg)  BMI 24.18 kg/m2  SpO2 98%    Review of Systems  Musculoskeletal: Positive for gait problem.  All other systems reviewed and are negative.       Objective:   Physical Exam Constitutional: He appears well-developed and well-nourished.  HENT:  Head: Normocephalic.  Eyes: Conjunctivae and EOM are normal. Pupils are equal, round, and reactive to light.  Cardiovascular: Normal rate.  Pulmonary/Chest: Effort normal.  Abdominal: Soft.  Neurological: He is alert. A cranial nerve deficit is present.  dyscongugate gaze, speech severely dysarthric. His volume is improved. He is managing his secretions quite well. Right upper extremity strength is improving with 3-4/5 strength at the shoulder and elbow. He's  1-2/5 at the wrist.  Remaining tone at the thumb and wrist is 1+ to two out of four. He has 1- to 1+ tone at the distal finger flexors, wrist, and pronators. The right pectoralis major and minor grossly trace. Right lower extremity is grossly 4/5 at the hip and knee. Ankle dorsiflexion and plantar flexion are 3/5-3+ out of 5. He does have heel cord tightness to the right ankle. He has slight diminishment of his sensory function in both right arm and leg.  Patient stood for me without assistance today. He tends to walk with a bit of a circumduction gait and he had some difficulties with initial swing phase of gait.  Cognitively, patient has shown a lot of improvement with better awareness and attention to these very alert. He follow commands today for me. He is able to communicate with  short words and phrases albeit very dysarthric language.  Skin: Skin is warm.  Peg site intact   Assessment & Plan:   ASSESSMENT:  1. History of traumatic brain injury with hydrocephalus, meningitis,  and right pontine stroke.  2. Spastic tetraplegia dominant limb  3. severe oropharyngeal dysphagia.   PLAN:  1. Continue with the dyna-splint RUE along with outpt OT, PT 2. Set up for Botox injections to the right flexor carpi radialis, ulnaris, FDS, FDP, PT, PQ 3. Reviewed appropriate exercise techniques. Stressed the importance of technique over speed. He could trial walking out of the AFO.

## 2013-04-17 ENCOUNTER — Ambulatory Visit: Payer: BC Managed Care – PPO | Admitting: Rehabilitative and Restorative Service Providers"

## 2013-04-21 ENCOUNTER — Ambulatory Visit
Payer: BC Managed Care – PPO | Attending: Physical Medicine & Rehabilitation | Admitting: Rehabilitative and Restorative Service Providers"

## 2013-04-21 DIAGNOSIS — M629 Disorder of muscle, unspecified: Secondary | ICD-10-CM | POA: Insufficient documentation

## 2013-04-21 DIAGNOSIS — IMO0001 Reserved for inherently not codable concepts without codable children: Secondary | ICD-10-CM | POA: Insufficient documentation

## 2013-04-21 DIAGNOSIS — M2420 Disorder of ligament, unspecified site: Secondary | ICD-10-CM | POA: Insufficient documentation

## 2013-04-21 DIAGNOSIS — G8110 Spastic hemiplegia affecting unspecified side: Secondary | ICD-10-CM | POA: Insufficient documentation

## 2013-04-21 DIAGNOSIS — R269 Unspecified abnormalities of gait and mobility: Secondary | ICD-10-CM | POA: Insufficient documentation

## 2013-04-25 ENCOUNTER — Ambulatory Visit: Payer: BC Managed Care – PPO | Admitting: Rehabilitative and Restorative Service Providers"

## 2013-04-27 ENCOUNTER — Ambulatory Visit: Payer: BC Managed Care – PPO | Admitting: Rehabilitative and Restorative Service Providers"

## 2013-05-02 ENCOUNTER — Ambulatory Visit: Payer: BC Managed Care – PPO | Admitting: Rehabilitative and Restorative Service Providers"

## 2013-05-04 ENCOUNTER — Ambulatory Visit: Payer: BC Managed Care – PPO | Admitting: Rehabilitative and Restorative Service Providers"

## 2013-05-09 ENCOUNTER — Ambulatory Visit: Payer: BC Managed Care – PPO | Admitting: Rehabilitative and Restorative Service Providers"

## 2013-05-11 ENCOUNTER — Ambulatory Visit: Payer: BC Managed Care – PPO | Admitting: Rehabilitative and Restorative Service Providers"

## 2013-05-15 ENCOUNTER — Encounter: Payer: Self-pay | Admitting: Physical Medicine & Rehabilitation

## 2013-05-15 ENCOUNTER — Encounter
Payer: BC Managed Care – PPO | Attending: Physical Medicine & Rehabilitation | Admitting: Physical Medicine & Rehabilitation

## 2013-05-15 VITALS — BP 100/65 | HR 70 | Resp 14 | Ht 71.0 in | Wt 156.8 lb

## 2013-05-15 DIAGNOSIS — S069X9S Unspecified intracranial injury with loss of consciousness of unspecified duration, sequela: Secondary | ICD-10-CM

## 2013-05-15 DIAGNOSIS — G8110 Spastic hemiplegia affecting unspecified side: Secondary | ICD-10-CM | POA: Insufficient documentation

## 2013-05-15 DIAGNOSIS — S069X0S Unspecified intracranial injury without loss of consciousness, sequela: Secondary | ICD-10-CM

## 2013-05-15 NOTE — Patient Instructions (Signed)
CALL ME WITH ANY PROBLEMS OR QUESTIONS (#[phone]).  HAVE A GOOD DAY  

## 2013-05-15 NOTE — Progress Notes (Signed)
Botox Injection for spasticity using needle EMG guidance  Dilution: 100 Units/ml Indication: Severe spasticity which interferes with ADL,mobility and/or  hygiene and is unresponsive to medication management and other conservative care Informed consent was obtained after describing risks and benefits of the procedure with the patient. This includes bleeding, bruising, infection, excessive weakness, or medication side effects. A REMS form is on file and signed. Needle: 50mm injectable monopolar needle electrode Number of units per muscle Pectoralis Major 0 Pectoralis Minor Biceps/brachioradialis 100 units, 4 access points Brachioradialis 0 FCR 25 FCU 25 u FDS 50 u FDP 50 u FPL 50 Pronator Teres 100 u   Pronator Quadratus 25 u   All injections were done after obtaining appropriate EMG activity and after negative drawback for blood. The patient tolerated the procedure well. Post procedure instructions were given. A followup appointment was made.   I'll see him back in about 2 months for reassessment

## 2013-05-16 ENCOUNTER — Ambulatory Visit: Payer: BC Managed Care – PPO | Admitting: Rehabilitative and Restorative Service Providers"

## 2013-05-18 ENCOUNTER — Ambulatory Visit: Payer: BC Managed Care – PPO | Admitting: Rehabilitative and Restorative Service Providers"

## 2013-05-23 ENCOUNTER — Ambulatory Visit: Payer: BC Managed Care – PPO | Admitting: Rehabilitative and Restorative Service Providers"

## 2013-05-25 ENCOUNTER — Ambulatory Visit
Payer: BC Managed Care – PPO | Attending: Physical Medicine & Rehabilitation | Admitting: Rehabilitative and Restorative Service Providers"

## 2013-05-25 DIAGNOSIS — G8110 Spastic hemiplegia affecting unspecified side: Secondary | ICD-10-CM | POA: Insufficient documentation

## 2013-05-25 DIAGNOSIS — M629 Disorder of muscle, unspecified: Secondary | ICD-10-CM | POA: Insufficient documentation

## 2013-05-25 DIAGNOSIS — R269 Unspecified abnormalities of gait and mobility: Secondary | ICD-10-CM | POA: Insufficient documentation

## 2013-05-25 DIAGNOSIS — M2420 Disorder of ligament, unspecified site: Secondary | ICD-10-CM | POA: Insufficient documentation

## 2013-05-25 DIAGNOSIS — IMO0001 Reserved for inherently not codable concepts without codable children: Secondary | ICD-10-CM | POA: Insufficient documentation

## 2013-05-30 ENCOUNTER — Ambulatory Visit: Payer: BC Managed Care – PPO | Admitting: Rehabilitative and Restorative Service Providers"

## 2013-06-01 ENCOUNTER — Ambulatory Visit: Payer: BC Managed Care – PPO | Admitting: Rehabilitative and Restorative Service Providers"

## 2013-06-06 ENCOUNTER — Ambulatory Visit: Payer: BC Managed Care – PPO | Admitting: Rehabilitative and Restorative Service Providers"

## 2013-07-11 ENCOUNTER — Encounter: Payer: Self-pay | Admitting: Physical Medicine & Rehabilitation

## 2013-07-11 ENCOUNTER — Encounter
Payer: BC Managed Care – PPO | Attending: Physical Medicine & Rehabilitation | Admitting: Physical Medicine & Rehabilitation

## 2013-07-11 VITALS — BP 104/65 | HR 81 | Resp 14 | Ht 66.0 in | Wt 153.0 lb

## 2013-07-11 DIAGNOSIS — G8110 Spastic hemiplegia affecting unspecified side: Secondary | ICD-10-CM | POA: Insufficient documentation

## 2013-07-11 DIAGNOSIS — S069X9S Unspecified intracranial injury with loss of consciousness of unspecified duration, sequela: Secondary | ICD-10-CM

## 2013-07-11 DIAGNOSIS — S069X0S Unspecified intracranial injury without loss of consciousness, sequela: Secondary | ICD-10-CM

## 2013-07-11 NOTE — Patient Instructions (Signed)
CALL ME WITH ANY PROBLEMS OR QUESTIONS (#[phone]).    HAPPY HOLIDAYS!!!!!   

## 2013-07-11 NOTE — Progress Notes (Signed)
Subjective:    Patient ID: Paul Kane, male    DOB: May 16, 1986, 27 y.o.   MRN: [id number]  HPI  Paul Kane is back regarding his stroke/BI with spastic tetraplegia. He continues to have good results with botox with increased hand and wrist use. He is using his dynasplint daily. He plays games and tries to use the hand with functional activities as well.    Pain Inventory Average Pain n/a Pain Right Now n/a My pain is n/a  In the last 24 hours, has pain interfered with the following? General activity n/a Relation with others n/a Enjoyment of life n/a What TIME of day is your pain at its worst? n/a Sleep (in general) n/a  Pain is worse with: n/a Pain improves with: n/a Relief from Meds: n/a  Mobility walk with assistance ability to climb steps?  yes do you drive?  no  Function disabled: date disabled . I need assistance with the following:  feeding, bathing, meal prep, household duties and shopping  Neuro/Psych weakness numbness trouble walking anxiety  Prior Studies Any changes since last visit?  no  Physicians involved in your care Any changes since last visit?  no   Family History  Problem Relation Age of Onset  . Diabetes    . Hyperlipidemia    . Hypertension     History   Social History  . Marital Status: Single    Spouse Name: N/A    Number of Children: N/A  . Years of Education: N/A   Social History Main Topics  . Smoking status: Never Smoker   . Smokeless tobacco: Never Used  . Alcohol Use: No  . Drug Use: No  . Sexual Activity: None   Other Topics Concern  . None   Social History Narrative  . None   Past Surgical History  Procedure Laterality Date  . Cramiectomy    . Ventriculoperitoneal shunt    . Cranioplasty     Past Medical History  Diagnosis Date  . Chronic hepatitis B   . Traumatic brain injury   . Intracranial shunt   . Stroke   . Herniation of brain stem   . Paralysis   . Fixed pupils   . Hypertension   . Trouble  swallowing   . Visual disturbance   . Weakness   . Intracranial injury of other and unspecified nature, without mention of open intracranial wound, unspecified state of consciousness   . Cerebral thrombosis with cerebral infarction   . Spastic hemiplegia affecting dominant side    BP 104/65  Pulse 81  Resp 14  Ht 5\' 6"  (1.676 m)  Wt 153 lb (69.4 kg)  BMI 24.71 kg/m2  SpO2 98%     Review of Systems  Musculoskeletal: Positive for gait problem.  Neurological: Positive for weakness and numbness.  Psychiatric/Behavioral: The patient is nervous/anxious.   All other systems reviewed and are negative.       Objective:   Physical Exam Constitutional: He appears well-developed and well-nourished.  HENT:  Head: Normocephalic.  Eyes: Conjunctivae and EOM are normal. Pupils are equal, round, and reactive to light.  Cardiovascular: Normal rate.  Pulmonary/Chest: Effort normal.  Abdominal: Soft.  Neurological: He is alert. A cranial nerve deficit is present.  dyscongugate gaze, speech severely dysarthric. His volume is improved.  Right upper extremity strength is improving with 3-4/5 strength at the shoulder and elbow. He's 1-2/5 at the wrist and fingers with flexion. Pronators are tight, likely PQ. Biceps BR, 1+.  The right pectoralis major and minor grossly trace. Right lower extremity is grossly 4/5 at the hip and knee. Ankle dorsiflexion and plantar flexion are 3/5-3+ out of 5. He does have heel cord tightness to the right ankle. He has slight diminishment of his sensory function in both right arm and leg.   Cognitively, patient has shown a lot of improvement with better awareness and attention to these very alert. He follow commands today for me. He is able to communicate with short words and phrases albeit very dysarthric language.  Skin: Skin is warm.  Peg site intact    Assessment & Plan:   ASSESSMENT:  1. History of traumatic brain injury with hydrocephalus, meningitis,  and  right pontine stroke.  2. Spastic tetraplegia dominant limb  3. severe oropharyngeal dysphagia.    PLAN:  1. Continue with the dyna-splint RUE along with outpt OT, PT  2. Set up for Botox injections (400u) to the right flexor carpi radialis, ulnaris, FDS, FDP, PT, PQ once again. He has improving tone in the wrist and fingers.  3. Reviewed appropriate exercise techniques. Stressed the importance of technique over speed. He could trial walking out of the AFO.

## 2013-08-15 ENCOUNTER — Encounter: Payer: Self-pay | Admitting: Physical Medicine & Rehabilitation

## 2013-08-15 ENCOUNTER — Encounter
Payer: BC Managed Care – PPO | Attending: Physical Medicine & Rehabilitation | Admitting: Physical Medicine & Rehabilitation

## 2013-08-15 VITALS — BP 109/67 | HR 78 | Resp 14 | Ht 66.0 in | Wt 155.0 lb

## 2013-08-15 DIAGNOSIS — G8110 Spastic hemiplegia affecting unspecified side: Secondary | ICD-10-CM | POA: Insufficient documentation

## 2013-08-15 DIAGNOSIS — S069X9A Unspecified intracranial injury with loss of consciousness of unspecified duration, initial encounter: Secondary | ICD-10-CM

## 2013-08-15 DIAGNOSIS — S069XAA Unspecified intracranial injury with loss of consciousness status unknown, initial encounter: Secondary | ICD-10-CM

## 2013-08-15 NOTE — Progress Notes (Signed)
Botox Injection for spasticity using needle EMG guidance Indication: spastic right hemiplegia  Dilution: 100 Units/ml        Total Units Injected: 400u into the RUE Indication: Severe spasticity which interferes with ADL,mobility and/or  hygiene and is unresponsive to medication management and other conservative care Informed consent was obtained after describing risks and benefits of the procedure with the patient. This includes bleeding, bruising, infection, excessive weakness, or medication side effects. A REMS form is on file and signed.  Needle: 50mm injectable monopolar needle electrode  Number of units per muscle   FCR 25 units FCU 25 units FDS 75 units FDP 75 units FPL 100 units Pronator Teres 75 units Pronator Quadratus 25 units   All injections were done after obtaining appropriate EMG activity and after negative drawback for blood. The patient tolerated the procedure well. Post procedure instructions were given. A followup appointment was made.

## 2013-08-15 NOTE — Patient Instructions (Signed)
PLEASE CALL ME WITH ANY PROBLEMS OR QUESTIONS (#[phone]).      

## 2013-09-25 ENCOUNTER — Telehealth: Payer: Self-pay | Admitting: Internal Medicine

## 2013-10-03 ENCOUNTER — Encounter (INDEPENDENT_AMBULATORY_CARE_PROVIDER_SITE_OTHER): Payer: Self-pay | Admitting: General Surgery

## 2013-10-03 ENCOUNTER — Ambulatory Visit (INDEPENDENT_AMBULATORY_CARE_PROVIDER_SITE_OTHER): Payer: BC Managed Care – PPO | Admitting: General Surgery

## 2013-10-03 VITALS — BP 122/80 | HR 76 | Temp 97.5°F | Resp 16 | Ht 67.0 in | Wt 159.0 lb

## 2013-10-03 DIAGNOSIS — Z931 Gastrostomy status: Secondary | ICD-10-CM

## 2013-10-03 NOTE — Progress Notes (Signed)
Subjective:     Patient ID: Paul Kane, male   DOB: Sep 01, 1985, 28 y.o.   MRN: [id number]  HPI The patient is a previous trauma patient of ours. He has a gastrostomy tube in place. He is eating well and no longer uses his gastrostomy tube.    Review of Systems No problems with the gastrostomy tube.    Objective:   Physical Exam The gastrostomy tube is in place and does not appear to be infected. Around the margins of the tube there is hypertrophic skin and mucosa. The gastrostomy tube was removed completely intact with minimal difficulty. The wound was covered with dry 4 x 4 gauze and tape.    Assessment:     Status post removal of gastrostomy tube     Plan:     I will see the patient back in 4 weeks to assess whether or not the tube hole has closed.

## 2013-10-10 ENCOUNTER — Encounter
Payer: BC Managed Care – PPO | Attending: Physical Medicine & Rehabilitation | Admitting: Physical Medicine & Rehabilitation

## 2013-10-10 ENCOUNTER — Encounter: Payer: Self-pay | Admitting: Physical Medicine & Rehabilitation

## 2013-10-10 VITALS — BP 105/64 | HR 76 | Resp 14 | Ht 66.0 in | Wt 159.0 lb

## 2013-10-10 DIAGNOSIS — G8250 Quadriplegia, unspecified: Secondary | ICD-10-CM | POA: Insufficient documentation

## 2013-10-10 DIAGNOSIS — I69991 Dysphagia following unspecified cerebrovascular disease: Secondary | ICD-10-CM | POA: Insufficient documentation

## 2013-10-10 DIAGNOSIS — S069X9A Unspecified intracranial injury with loss of consciousness of unspecified duration, initial encounter: Secondary | ICD-10-CM

## 2013-10-10 DIAGNOSIS — I10 Essential (primary) hypertension: Secondary | ICD-10-CM | POA: Insufficient documentation

## 2013-10-10 DIAGNOSIS — S069X9S Unspecified intracranial injury with loss of consciousness of unspecified duration, sequela: Secondary | ICD-10-CM | POA: Insufficient documentation

## 2013-10-10 DIAGNOSIS — X58XXXS Exposure to other specified factors, sequela: Secondary | ICD-10-CM | POA: Insufficient documentation

## 2013-10-10 DIAGNOSIS — S069XAA Unspecified intracranial injury with loss of consciousness status unknown, initial encounter: Secondary | ICD-10-CM

## 2013-10-10 DIAGNOSIS — S069XAS Unspecified intracranial injury with loss of consciousness status unknown, sequela: Secondary | ICD-10-CM | POA: Insufficient documentation

## 2013-10-10 DIAGNOSIS — R1310 Dysphagia, unspecified: Secondary | ICD-10-CM | POA: Insufficient documentation

## 2013-10-10 DIAGNOSIS — G8110 Spastic hemiplegia affecting unspecified side: Secondary | ICD-10-CM

## 2013-10-10 NOTE — Patient Instructions (Addendum)
WORK ON USING YOUR HAND FOR PURPOSEFUL ACTIVITIES!!!!  TRY A SMALL WEDGE OR HEEL INSERT IN THE BACK OF THE SHOE TO HELP WITH THE KNEE HYPEREXTENSION---YOU MAY EVEN FOLD UP SOME PAPER AND PUT IT UNDERNEATH  THE INSOLE

## 2013-10-10 NOTE — Progress Notes (Signed)
Subjective:    Patient ID: Paul Kane, male    DOB: 1986-05-04, 28 y.o.   MRN: [id number]  HPI  Paul Kane is back regarding his chronic issues. He had some results with the most recent botox, but not to the extent he has had before.  He is walking more. He is trying to build up strength in his leg. He uses the right hand sometimes, but generally does most activities with the left side.  He had his g-tube removed by Dr. Lindie SpruceWyatt last week. He is excited about this!   Pain Inventory Average Pain 0 Pain Right Now 0 My pain is no pain  In the last 24 hours, has pain interfered with the following? General activity 0 Relation with others 0 Enjoyment of life 0 What TIME of day is your pain at its worst? no pain Sleep (in general) Good  Pain is worse with: no pain Pain improves with: no pain Relief from Meds: 0  Mobility walk without assistance how many minutes can you walk? 30 ability to climb steps?  yes do you drive?  no transfers alone Do you have any goals in this area?  yes  Function disabled: date disabled 11/23/2009 I need assistance with the following:  feeding, dressing, bathing, toileting, meal prep, household duties and shopping  Neuro/Psych weakness spasms dizziness  Prior Studies Any changes since last visit?  no  Physicians involved in your care Any changes since last visit?  no   Family History  Problem Relation Age of Onset  . Diabetes    . Hyperlipidemia    . Hypertension     History   Social History  . Marital Status: Single    Spouse Name: N/A    Number of Children: N/A  . Years of Education: N/A   Social History Main Topics  . Smoking status: Never Smoker   . Smokeless tobacco: Never Used  . Alcohol Use: No  . Drug Use: No  . Sexual Activity: None   Other Topics Concern  . None   Social History Narrative  . None   Past Surgical History  Procedure Laterality Date  . Cramiectomy    . Ventriculoperitoneal shunt    . Cranioplasty      Past Medical History  Diagnosis Date  . Chronic hepatitis B   . Traumatic brain injury   . Intracranial shunt   . Stroke   . Herniation of brain stem   . Paralysis   . Fixed pupils   . Hypertension   . Trouble swallowing   . Visual disturbance   . Weakness   . Intracranial injury of other and unspecified nature, without mention of open intracranial wound, unspecified state of consciousness   . Cerebral thrombosis with cerebral infarction   . Spastic hemiplegia affecting dominant side    BP 105/64  Pulse 76  Resp 14  Ht 5\' 6"  (1.676 m)  Wt 159 lb (72.122 kg)  BMI 25.68 kg/m2  SpO2 99%  Opioid Risk Score:   Fall Risk Score: Low Fall Risk (0-5 points) (pt educated and given brochure on fall risk previously)    Review of Systems  Neurological: Positive for dizziness and weakness.       Spasms  All other systems reviewed and are negative.       Objective:   Physical Exam  Constitutional: He appears well-developed and well-nourished.  HENT:  Head: Normocephalic.  Eyes: Conjunctivae and EOM are normal. Pupils are equal, round, and reactive  to light.  Cardiovascular: Normal rate.  Pulmonary/Chest: Effort normal.  Abdominal: Soft.  Neurological: He is alert. A cranial nerve deficit is present.  dyscongugate gaze, speech severely dysarthric. His volume is good. Right upper extremity strength is improving with 3-4/5 strength at the shoulder and elbow. He's 1 to 1+/5 at the wrist and fingers with flexion. Pronators are 1-2, likely PQ. Biceps BR, 1. The right pectoralis major and minor grossly trace. Right lower extremity is grossly 4/5 at the hip and knee. Ankle dorsiflexion and plantar flexion are 3/5-3+ out of 5. He does have heel cord tightness to the right ankle. He has slight diminishment of his sensory function in both right arm and leg. He ambulated today and uses much better technique. His only problems are with impulsivity and a little circumduction on the right as  well as recurvatum at the right knee.   Cognitively, patient has shown a lot of improvement with better awareness and attention to these very alert. He follow commands today for me. He is able to communicate with short words and phrases albeit very dysarthric language.  Skin: Skin is warm.  Peg site with hypergranulating tissue about 1.5 cm in diameter   Assessment & Plan:   ASSESSMENT:  1. History of traumatic brain injury with hydrocephalus, meningitis,  and right pontine stroke.  2. Spastic tetraplegia dominant limb  3. severe oropharyngeal dysphagia--s/p peg removal. Has hypergranulating tissue  PLAN:  1. Continue with the dyna-splint RUE. Discussed a small heel insert to help with recurvatum. They may even try a piece of folded paper under the insole of his shoe. Therapy can work on this as well.  2. Consider follow up Botox injections at some point to the right flexor carpi radialis, ulnaris, FDS, FDP, PT, PQ once again.    3. Reviewed appropriate exercise techniques. Stressed the importance of technique over speed. He could trial walking out of the AFO. 4. Applied silver nitrate to hypergranulating tissue over form peg site. Instructed parents on future application 5.Follow up with me in three months. 15 minutes of face to face patient care time were spent during this visit. All questions were encouraged and answered.

## 2013-10-25 ENCOUNTER — Ambulatory Visit
Payer: BC Managed Care – PPO | Attending: Physical Medicine & Rehabilitation | Admitting: Rehabilitative and Restorative Service Providers"

## 2013-10-25 DIAGNOSIS — R269 Unspecified abnormalities of gait and mobility: Secondary | ICD-10-CM | POA: Diagnosis not present

## 2013-10-25 DIAGNOSIS — IMO0001 Reserved for inherently not codable concepts without codable children: Secondary | ICD-10-CM | POA: Diagnosis present

## 2013-10-25 DIAGNOSIS — M6281 Muscle weakness (generalized): Secondary | ICD-10-CM | POA: Diagnosis not present

## 2013-10-31 ENCOUNTER — Encounter (INDEPENDENT_AMBULATORY_CARE_PROVIDER_SITE_OTHER): Payer: BC Managed Care – PPO | Admitting: General Surgery

## 2013-10-31 ENCOUNTER — Ambulatory Visit: Payer: BC Managed Care – PPO | Admitting: Rehabilitative and Restorative Service Providers"

## 2013-10-31 DIAGNOSIS — IMO0001 Reserved for inherently not codable concepts without codable children: Secondary | ICD-10-CM | POA: Diagnosis not present

## 2013-11-01 ENCOUNTER — Ambulatory Visit: Payer: BC Managed Care – PPO | Admitting: Rehabilitative and Restorative Service Providers"

## 2013-11-01 DIAGNOSIS — IMO0001 Reserved for inherently not codable concepts without codable children: Secondary | ICD-10-CM | POA: Diagnosis not present

## 2013-11-07 ENCOUNTER — Ambulatory Visit: Payer: BC Managed Care – PPO | Admitting: Rehabilitative and Restorative Service Providers"

## 2013-11-07 DIAGNOSIS — IMO0001 Reserved for inherently not codable concepts without codable children: Secondary | ICD-10-CM | POA: Diagnosis not present

## 2013-11-10 ENCOUNTER — Ambulatory Visit: Payer: BC Managed Care – PPO | Admitting: Rehabilitative and Restorative Service Providers"

## 2013-11-10 DIAGNOSIS — IMO0001 Reserved for inherently not codable concepts without codable children: Secondary | ICD-10-CM | POA: Diagnosis not present

## 2013-11-13 ENCOUNTER — Other Ambulatory Visit: Payer: BC Managed Care – PPO

## 2013-11-14 ENCOUNTER — Ambulatory Visit: Payer: BC Managed Care – PPO | Admitting: Rehabilitative and Restorative Service Providers"

## 2013-11-14 DIAGNOSIS — IMO0001 Reserved for inherently not codable concepts without codable children: Secondary | ICD-10-CM | POA: Diagnosis not present

## 2013-11-17 ENCOUNTER — Ambulatory Visit
Payer: BC Managed Care – PPO | Attending: Physical Medicine & Rehabilitation | Admitting: Rehabilitative and Restorative Service Providers"

## 2013-11-17 DIAGNOSIS — R269 Unspecified abnormalities of gait and mobility: Secondary | ICD-10-CM | POA: Insufficient documentation

## 2013-11-17 DIAGNOSIS — M6281 Muscle weakness (generalized): Secondary | ICD-10-CM | POA: Insufficient documentation

## 2013-11-17 DIAGNOSIS — IMO0001 Reserved for inherently not codable concepts without codable children: Secondary | ICD-10-CM | POA: Insufficient documentation

## 2013-11-21 ENCOUNTER — Ambulatory Visit: Payer: BC Managed Care – PPO | Admitting: Rehabilitative and Restorative Service Providers"

## 2013-11-21 DIAGNOSIS — IMO0001 Reserved for inherently not codable concepts without codable children: Secondary | ICD-10-CM | POA: Diagnosis not present

## 2013-11-24 ENCOUNTER — Ambulatory Visit: Payer: BC Managed Care – PPO | Admitting: Rehabilitative and Restorative Service Providers"

## 2013-11-24 DIAGNOSIS — IMO0001 Reserved for inherently not codable concepts without codable children: Secondary | ICD-10-CM | POA: Diagnosis not present

## 2013-11-27 ENCOUNTER — Encounter: Payer: BC Managed Care – PPO | Admitting: Family Medicine

## 2013-11-28 ENCOUNTER — Encounter (INDEPENDENT_AMBULATORY_CARE_PROVIDER_SITE_OTHER): Payer: Self-pay | Admitting: General Surgery

## 2013-11-28 ENCOUNTER — Ambulatory Visit (INDEPENDENT_AMBULATORY_CARE_PROVIDER_SITE_OTHER): Payer: BC Managed Care – PPO | Admitting: General Surgery

## 2013-11-28 VITALS — BP 106/72 | HR 80 | Temp 97.8°F | Resp 14 | Ht 67.0 in | Wt 157.4 lb

## 2013-11-28 DIAGNOSIS — S069XAA Unspecified intracranial injury with loss of consciousness status unknown, initial encounter: Secondary | ICD-10-CM

## 2013-11-28 DIAGNOSIS — S069X9A Unspecified intracranial injury with loss of consciousness of unspecified duration, initial encounter: Secondary | ICD-10-CM

## 2013-11-28 NOTE — Progress Notes (Signed)
Subjective:     Patient ID: Paul Kane, male   DOB: [birth year], 28 y.o.   MRN: [id number]  HPI Doing well.  Wants to work out  Review of Systems Asymtomatic    Objective:   Physical Exam Completely closed and healed    Assessment:     Closed G-tube site     Plan:     Return PRN

## 2013-11-29 ENCOUNTER — Ambulatory Visit: Payer: BC Managed Care – PPO | Admitting: Rehabilitative and Restorative Service Providers"

## 2013-11-29 DIAGNOSIS — IMO0001 Reserved for inherently not codable concepts without codable children: Secondary | ICD-10-CM | POA: Diagnosis not present

## 2013-12-01 ENCOUNTER — Ambulatory Visit: Payer: BC Managed Care – PPO | Admitting: Rehabilitative and Restorative Service Providers"

## 2013-12-01 DIAGNOSIS — IMO0001 Reserved for inherently not codable concepts without codable children: Secondary | ICD-10-CM | POA: Diagnosis not present

## 2013-12-05 ENCOUNTER — Ambulatory Visit: Payer: BC Managed Care – PPO | Admitting: Rehabilitative and Restorative Service Providers"

## 2013-12-05 DIAGNOSIS — IMO0001 Reserved for inherently not codable concepts without codable children: Secondary | ICD-10-CM | POA: Diagnosis not present

## 2013-12-08 ENCOUNTER — Ambulatory Visit: Payer: BC Managed Care – PPO | Admitting: Rehabilitative and Restorative Service Providers"

## 2013-12-08 DIAGNOSIS — IMO0001 Reserved for inherently not codable concepts without codable children: Secondary | ICD-10-CM | POA: Diagnosis not present

## 2013-12-13 ENCOUNTER — Ambulatory Visit: Payer: BC Managed Care – PPO | Admitting: Rehabilitative and Restorative Service Providers"

## 2013-12-13 DIAGNOSIS — IMO0001 Reserved for inherently not codable concepts without codable children: Secondary | ICD-10-CM | POA: Diagnosis not present

## 2013-12-20 ENCOUNTER — Ambulatory Visit
Payer: BC Managed Care – PPO | Attending: Physical Medicine & Rehabilitation | Admitting: Rehabilitative and Restorative Service Providers"

## 2013-12-20 DIAGNOSIS — M6281 Muscle weakness (generalized): Secondary | ICD-10-CM | POA: Insufficient documentation

## 2013-12-20 DIAGNOSIS — IMO0001 Reserved for inherently not codable concepts without codable children: Secondary | ICD-10-CM | POA: Diagnosis present

## 2013-12-20 DIAGNOSIS — R269 Unspecified abnormalities of gait and mobility: Secondary | ICD-10-CM | POA: Insufficient documentation

## 2013-12-25 ENCOUNTER — Other Ambulatory Visit: Payer: BC Managed Care – PPO

## 2013-12-26 ENCOUNTER — Other Ambulatory Visit: Payer: BC Managed Care – PPO

## 2013-12-27 ENCOUNTER — Ambulatory Visit: Payer: BC Managed Care – PPO | Admitting: Rehabilitative and Restorative Service Providers"

## 2014-01-01 ENCOUNTER — Ambulatory Visit (INDEPENDENT_AMBULATORY_CARE_PROVIDER_SITE_OTHER): Payer: BC Managed Care – PPO | Admitting: Internal Medicine

## 2014-01-01 ENCOUNTER — Encounter: Payer: Self-pay | Admitting: Internal Medicine

## 2014-01-01 VITALS — BP 108/68 | HR 88 | Temp 97.5°F | Ht 67.0 in | Wt 162.0 lb

## 2014-01-01 DIAGNOSIS — Z23 Encounter for immunization: Secondary | ICD-10-CM

## 2014-01-01 DIAGNOSIS — Z0000 Encounter for general adult medical examination without abnormal findings: Secondary | ICD-10-CM

## 2014-01-01 NOTE — Progress Notes (Signed)
Pre visit review using our clinic review tool, if applicable. No additional management support is needed unless otherwise documented below in the visit note. 

## 2014-01-01 NOTE — Addendum Note (Signed)
Addended by: Alfred LevinsWYRICK, [name] D on: 01/01/2014 04:24 PM   Modules accepted: Orders

## 2014-01-01 NOTE — Progress Notes (Signed)
   Subjective:    Patient ID: Paul Kane, male    DOB: [birth year], 28 y.o.   MRN: [id number]  HPI Had blood work at [facility] in May and does not want any blood work today Had noted mild sz and has checked the levels of the medicaitons and the doses were increased He is alert, still has expressive aphasia  CPX  Review of Systems  Constitutional:       Yes and no only   HENT: Positive for hearing loss.   Eyes: Negative.   Respiratory: Negative.   Cardiovascular: Negative.   Gastrointestinal: Negative.   Endocrine: Negative.   Genitourinary: Negative.   Allergic/Immunologic: Negative.   Neurological: Positive for seizures, facial asymmetry, speech difficulty and weakness.       Objective:   Physical Exam  Nursing note and vitals reviewed. Constitutional: He is oriented to person, place, and time. He appears well-developed and well-nourished.  HENT:  Head: Atraumatic.  Skull deformaty  Eyes:  Disconjugate gaze  Cardiovascular: Normal rate and regular rhythm.   Murmur heard. Abdominal: Bowel sounds are normal.  Neurological: He is alert and oriented to person, place, and time.  Skin: Skin is warm.  Psychiatric:  Closed brain injury with hemi-paresis          Assessment & Plan:  Patient presents for yearly preventative medicine examination. Mother completed ROS   All immunizations and health maintenance protocols were reviewed with the patient and needed orders were placed.  DPT given  Appropriate screening laboratory values were reviewed from neurologist report  Medication reconciliation,  past medical history, social history, problem list and allergies were reviewed in detail with the patient  Goals were established with regard to weight loss, exercise, and  diet in compliance with medications SZ precautions discused  End of life planning was discussed.

## 2014-01-01 NOTE — Patient Instructions (Signed)
The patient is instructed to continue all medications as prescribed. Schedule followup with check out clerk upon leaving the clinic  

## 2014-01-03 ENCOUNTER — Ambulatory Visit: Payer: BC Managed Care – PPO | Admitting: Rehabilitative and Restorative Service Providers"

## 2014-01-03 DIAGNOSIS — IMO0001 Reserved for inherently not codable concepts without codable children: Secondary | ICD-10-CM | POA: Diagnosis not present

## 2014-01-10 ENCOUNTER — Other Ambulatory Visit: Payer: Self-pay

## 2014-01-10 ENCOUNTER — Encounter
Payer: BC Managed Care – PPO | Attending: Physical Medicine & Rehabilitation | Admitting: Physical Medicine & Rehabilitation

## 2014-01-10 ENCOUNTER — Encounter: Payer: Self-pay | Admitting: Physical Medicine & Rehabilitation

## 2014-01-10 VITALS — BP 123/65 | HR 90 | Resp 14 | Ht 68.0 in | Wt 163.0 lb

## 2014-01-10 DIAGNOSIS — S069XAS Unspecified intracranial injury with loss of consciousness status unknown, sequela: Secondary | ICD-10-CM | POA: Insufficient documentation

## 2014-01-10 DIAGNOSIS — I10 Essential (primary) hypertension: Secondary | ICD-10-CM | POA: Insufficient documentation

## 2014-01-10 DIAGNOSIS — S069X5A Unspecified intracranial injury with loss of consciousness greater than 24 hours with return to pre-existing conscious level, initial encounter: Secondary | ICD-10-CM

## 2014-01-10 DIAGNOSIS — S069X9S Unspecified intracranial injury with loss of consciousness of unspecified duration, sequela: Secondary | ICD-10-CM | POA: Diagnosis not present

## 2014-01-10 DIAGNOSIS — I69998 Other sequelae following unspecified cerebrovascular disease: Secondary | ICD-10-CM | POA: Insufficient documentation

## 2014-01-10 DIAGNOSIS — R1310 Dysphagia, unspecified: Secondary | ICD-10-CM | POA: Insufficient documentation

## 2014-01-10 DIAGNOSIS — X58XXXS Exposure to other specified factors, sequela: Secondary | ICD-10-CM | POA: Insufficient documentation

## 2014-01-10 DIAGNOSIS — G8110 Spastic hemiplegia affecting unspecified side: Secondary | ICD-10-CM

## 2014-01-10 NOTE — Patient Instructions (Signed)
PLEASE CALL ME WITH ANY PROBLEMS OR QUESTIONS (#[phone]).      

## 2014-01-10 NOTE — Progress Notes (Signed)
Subjective:    Patient ID: Paul Kane, male    DOB: May 04, 1986, 28 y.o.   MRN: [id number]  HPI  Paul Kane is back regarding his TBI and spastic hemiparesis. Overall things have remained stable. He has been diligent with his HEP and [facility] His sister is going with him a lot this summer. He works daily on his ROM at home.  We did not perform botox at last visit as he's remained fairly stable. His dad notes that after he stretches the arm remains fairly loose.  His keppra dose was recently increased due to ?seizure activity in the am and a low keppra level.  Pain Inventory Average Pain 0 Pain Right Now 0 My pain is no pain  In the last 24 hours, has pain interfered with the following? General activity 0 Relation with others 0 Enjoyment of life 0 What TIME of day is your pain at its worst? no pain Sleep (in general) Good  Pain is worse with: na Pain improves with: na Relief from Meds: no pain meds  Mobility walk without assistance how many minutes can you walk? 30 ability to climb steps?  yes do you drive?  no transfers alone Do you have any goals in this area?  yes  Function disabled: date disabled 11/23/2009 I need assistance with the following:  feeding, dressing, bathing, toileting, meal prep, household duties and shopping  Neuro/Psych weakness spasms dizziness  Prior Studies Any changes since last visit?  no  Physicians involved in your care Any changes since last visit?  no   Family History  Problem Relation Age of Onset  . Diabetes    . Hyperlipidemia    . Hypertension     History   Social History  . Marital Status: Single    Spouse Name: N/A    Number of Children: N/A  . Years of Education: N/A   Social History Main Topics  . Smoking status: Never Smoker   . Smokeless tobacco: Never Used  . Alcohol Use: No  . Drug Use: No  . Sexual Activity: None   Other Topics Concern  . None   Social History Narrative  . None   Past  Surgical History  Procedure Laterality Date  . Cramiectomy    . Ventriculoperitoneal shunt    . Cranioplasty    . Removal of gastrostomy tube     Past Medical History  Diagnosis Date  . Chronic hepatitis B   . Traumatic brain injury   . Intracranial shunt   . Stroke   . Herniation of brain stem   . Paralysis   . Fixed pupils   . Hypertension   . Trouble swallowing   . Visual disturbance   . Weakness   . Intracranial injury of other and unspecified nature, without mention of open intracranial wound, unspecified state of consciousness   . Cerebral thrombosis with cerebral infarction   . Spastic hemiplegia affecting dominant side    BP 123/65  Pulse 90  Resp 14  Ht 5\' 8"  (1.727 m)  Wt 163 lb (73.936 kg)  BMI 24.79 kg/m2  SpO2 97%  Opioid Risk Score:   Fall Risk Score: Low Fall Risk (0-5 points) (pt educated on fall risk, brochure given to pt previously)   Review of Systems  Neurological: Positive for dizziness and weakness.       Spasms  All other systems reviewed and are negative.      Objective:   Physical Exam  Constitutional: He appears well-developed and well-nourished.  HENT:  Head: Normocephalic.  Eyes: Conjunctivae and EOM are normal. Pupils are equal, round, and reactive to light.  Cardiovascular: Normal rate.  Pulmonary/Chest: Effort normal.  Abdominal: Soft.  Neurological: He is alert. A cranial nerve deficit is present.  dyscongugate gaze, speech severely dysarthric. His volume is good. Right upper extremity strength is improving with 3-4/5 strength at the shoulder and elbow. He's 1 to 2/5 at the wrist and fingers with flexion. Pronators are 1-2, likely PQ. Biceps BR, 1-2. The right pectoralis major and minor grossly trace. Right lower extremity is grossly 4/5 at the hip and knee. Ankle dorsiflexion and plantar flexion are 3/5-3+ out of 5. He has continued heel cord tightness to the right ankle. He has slight diminishment of his sensory function in both  right arm and leg. He ambulated today a bit impulsively but he clears the leg better. There is still some steppage and circumduction as well.    Cognitively, more alert and aware.   communicates with short words and phrases albeit very dysarthric language.  Skin: Skin is warm.  Peg site healed.   Assessment & Plan:   ASSESSMENT:  1. History of traumatic brain injury with hydrocephalus, meningitis,  and right pontine stroke.  2. Spastic tetraplegia dominant limb  3. severe oropharyngeal dysphagia--s/p peg removal. Has hypergranulating tissue    PLAN:  1. Continue with the dyna-splint RUE and regular exercise and stretching. He appears fairly stable from a spasticity standpoint today.  2. Consider follow up Botox injections at some point again to the right flexor carpi radialis, ulnaris, FDS, FDP, PT, PQ, tricep/bicep depending upon presentation. Consider botulinum toxin B also.   3. Reviewed appropriate exercise techniques. Stressed the importance of technique over speed.  4.  Follow up with me in three months. 15 minutes of face to face patient care time were spent during this visit. All questions were encouraged and answered.

## 2014-01-22 ENCOUNTER — Telehealth: Payer: Self-pay | Admitting: Physical Medicine & Rehabilitation

## 2014-01-22 NOTE — Telephone Encounter (Signed)
Pt's mom came in today... She would like her son to have botox on his right arm... She states it getting worse and its pulling. Pt's mom was advise of the Dr. Riley KillSwartz schedule and she was given an appointment [phone] as well insure her she will be put on a cncl list. Plus a note sent to MD so he will know what is going on with her son. She felt better. If there any concerns or questions please feel free to call mom at [phone]... Thanks

## 2014-03-16 ENCOUNTER — Ambulatory Visit
Payer: BC Managed Care – PPO | Attending: Physical Medicine & Rehabilitation | Admitting: Rehabilitative and Restorative Service Providers"

## 2014-03-16 DIAGNOSIS — M6281 Muscle weakness (generalized): Secondary | ICD-10-CM | POA: Insufficient documentation

## 2014-03-16 DIAGNOSIS — R269 Unspecified abnormalities of gait and mobility: Secondary | ICD-10-CM | POA: Insufficient documentation

## 2014-03-16 DIAGNOSIS — IMO0001 Reserved for inherently not codable concepts without codable children: Secondary | ICD-10-CM | POA: Insufficient documentation

## 2014-03-19 ENCOUNTER — Encounter: Payer: Self-pay | Admitting: Physical Medicine & Rehabilitation

## 2014-03-19 ENCOUNTER — Encounter
Payer: BC Managed Care – PPO | Attending: Physical Medicine & Rehabilitation | Admitting: Physical Medicine & Rehabilitation

## 2014-03-19 VITALS — BP 110/78 | HR 76 | Resp 16 | Wt 161.2 lb

## 2014-03-19 DIAGNOSIS — R1310 Dysphagia, unspecified: Secondary | ICD-10-CM | POA: Insufficient documentation

## 2014-03-19 DIAGNOSIS — S069X9S Unspecified intracranial injury with loss of consciousness of unspecified duration, sequela: Secondary | ICD-10-CM | POA: Insufficient documentation

## 2014-03-19 DIAGNOSIS — G8110 Spastic hemiplegia affecting unspecified side: Secondary | ICD-10-CM | POA: Diagnosis not present

## 2014-03-19 DIAGNOSIS — S069XAS Unspecified intracranial injury with loss of consciousness status unknown, sequela: Secondary | ICD-10-CM | POA: Insufficient documentation

## 2014-03-19 DIAGNOSIS — X58XXXS Exposure to other specified factors, sequela: Secondary | ICD-10-CM | POA: Insufficient documentation

## 2014-03-19 DIAGNOSIS — I10 Essential (primary) hypertension: Secondary | ICD-10-CM | POA: Insufficient documentation

## 2014-03-19 DIAGNOSIS — I69998 Other sequelae following unspecified cerebrovascular disease: Secondary | ICD-10-CM | POA: Insufficient documentation

## 2014-03-19 NOTE — Patient Instructions (Signed)
PLEASE CALL ME WITH ANY PROBLEMS OR QUESTIONS (#[phone]).      

## 2014-03-19 NOTE — Progress Notes (Signed)
Botox Injection for spasticity using needle EMG guidance Indication: spastic hemiparesis, non-dominant limb, LUE  Dilution: 100 Units/ml        Total Units Injected: 400 Indication: Severe spasticity which interferes with ADL,mobility and/or  hygiene and is unresponsive to medication management and other conservative care Informed consent was obtained after describing risks and benefits of the procedure with the patient. This includes bleeding, bruising, infection, excessive weakness, or medication side effects. A REMS form is on file and signed.  Needle: 50mm injectable monopolar needle electrode  Number of units per muscle Pectoralis Major 0 units Pectoralis Minor 0 units Biceps 100 units with 2 access points Brachioradialis 0 units FCR 0 units FCU 50 units FDS 50 units FDP 50 units FPL 50 units Pronator Teres 50 units Pronator Quadratus 50 units  All injections were done after obtaining appropriate EMG activity and after negative drawback for blood. The patient tolerated the procedure well. Post procedure instructions were given. A followup appointment was made.

## 2014-04-13 ENCOUNTER — Ambulatory Visit: Payer: BC Managed Care – PPO | Admitting: Physical Medicine & Rehabilitation

## 2014-05-11 ENCOUNTER — Encounter
Payer: BC Managed Care – PPO | Attending: Physical Medicine & Rehabilitation | Admitting: Physical Medicine & Rehabilitation

## 2014-05-11 ENCOUNTER — Encounter: Payer: Self-pay | Admitting: Physical Medicine & Rehabilitation

## 2014-05-11 VITALS — BP 108/56 | HR 82 | Resp 14 | Ht 68.0 in | Wt 160.0 lb

## 2014-05-11 DIAGNOSIS — S069X5S Unspecified intracranial injury with loss of consciousness greater than 24 hours with return to pre-existing conscious level, sequela: Secondary | ICD-10-CM

## 2014-05-11 DIAGNOSIS — I69354 Hemiplegia and hemiparesis following cerebral infarction affecting left non-dominant side: Secondary | ICD-10-CM | POA: Insufficient documentation

## 2014-05-11 DIAGNOSIS — G8110 Spastic hemiplegia affecting unspecified side: Secondary | ICD-10-CM

## 2014-05-11 DIAGNOSIS — I638 Other cerebral infarction: Secondary | ICD-10-CM | POA: Diagnosis not present

## 2014-05-11 DIAGNOSIS — R1310 Dysphagia, unspecified: Secondary | ICD-10-CM | POA: Insufficient documentation

## 2014-05-11 DIAGNOSIS — G935 Compression of brain: Secondary | ICD-10-CM | POA: Diagnosis not present

## 2014-05-11 DIAGNOSIS — S069X0S Unspecified intracranial injury without loss of consciousness, sequela: Secondary | ICD-10-CM | POA: Diagnosis present

## 2014-05-11 DIAGNOSIS — H539 Unspecified visual disturbance: Secondary | ICD-10-CM | POA: Diagnosis not present

## 2014-05-11 DIAGNOSIS — I10 Essential (primary) hypertension: Secondary | ICD-10-CM | POA: Insufficient documentation

## 2014-05-11 DIAGNOSIS — R531 Weakness: Secondary | ICD-10-CM | POA: Diagnosis not present

## 2014-05-11 DIAGNOSIS — Z9689 Presence of other specified functional implants: Secondary | ICD-10-CM | POA: Insufficient documentation

## 2014-05-11 DIAGNOSIS — B181 Chronic viral hepatitis B without delta-agent: Secondary | ICD-10-CM | POA: Diagnosis not present

## 2014-05-11 NOTE — Patient Instructions (Signed)
PLEASE CALL ME WITH ANY PROBLEMS OR QUESTIONS (#[phone]).      

## 2014-05-11 NOTE — Progress Notes (Signed)
Subjective:    Patient ID: Paul HumbleHayri E Kane, male    DOB: 17-Jan-1986, 28 y.o.   MRN: [id number]  HPI  Paul Kane is back regarding his TBI and CVA with spastic  Left HP. 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   The botox injections were helpful. He has been opening his hand much easier. He continues with his HEP and [employer] work outs.   There has been some question of sleep difficulties and his neurologist rx'ed him klonopin.     Pain Inventory Average Pain 0 Pain Right Now 0 My pain is no pain  In the last 24 hours, has pain interfered with the following? General activity 0 Relation with others 0 Enjoyment of life 0 What TIME of day is your pain at its worst? no pain Sleep (in general) NA  Pain is worse with: . Pain improves with: n/a Relief from Meds: 0  Mobility walk without assistance  Function disabled: date disabled .  Neuro/Psych weakness  Prior Studies Any changes since last visit?  no  Physicians involved in your care Any changes since last visit?  no   Family History  Problem Relation Age of Onset  . Diabetes    . Hyperlipidemia    . Hypertension     History   Social History  . Marital Status: Single    Spouse Name: N/A    Number of Children: N/A  . Years of Education: N/A   Social History Main Topics  . Smoking status: Never Smoker   . Smokeless tobacco: Never Used  . Alcohol Use: No  . Drug Use: No  . Sexual Activity: None   Other Topics Concern  . None   Social History Narrative  . None   Past Surgical History  Procedure Laterality Date  . Cramiectomy    . Ventriculoperitoneal shunt    . Cranioplasty    . Removal of gastrostomy tube     Past Medical History  Diagnosis Date  . Chronic hepatitis B   . Traumatic brain injury   . Intracranial shunt   . Stroke   . Herniation of brain stem   . Paralysis   . Fixed pupils   . Hypertension   . Trouble swallowing   . Visual disturbance   . Weakness    . Intracranial injury of other and unspecified nature, without mention of open intracranial wound, unspecified state of consciousness   . Cerebral thrombosis with cerebral infarction   . Spastic hemiplegia affecting dominant side    BP 144/78  Pulse 82  Resp 14  SpO2 98%  Opioid Risk Score:   Fall Risk Score: Low Fall Risk (0-5 points)     Review of Systems     Objective:   Physical Exam  Constitutional: He appears well-developed and well-nourished.  HENT:  Head: Normocephalic.  Eyes: Conjunctivae and EOM are normal. Pupils are equal, round, and reactive to light.  Cardiovascular: Normal rate.  Pulmonary/Chest: Effort normal.  Abdominal: Soft.  Neurological: He is alert. A cranial nerve deficit is present.  dyscongugate gaze, speech severely dysarthric. His volume is good. Right upper extremity strength is improving with 3-4/5 strength at the shoulder and elbow. He's 1/5 at the wrist and fingers with flexion. Pronators are 2/4, likely PQ. Biceps BR, 1. The right pectoralis major and minor grossly trace. Right lower extremity is grossly 4/5 at the hip and knee. Ankle dorsiflexion and plantar flexion are 3/5-3+ out of 5. He  has continued heel cord tightness to the right ankle. He has slight diminishment of his sensory function in both right arm and leg. He ambulated today a bit impulsively but he clears the leg better. There is still some steppage and circumduction as well.    Cognitively, more alert and aware. communicates with short words and phrases albeit very dysarthric language.  Skin: Skin is warm.  Peg site healed.   Assessment & Plan:   ASSESSMENT:  1. History of traumatic brain injury with hydrocephalus, meningitis,  and right pontine stroke.  2. Spastic tetraplegia dominant limb  3. severe oropharyngeal dysphagia--improved--tolerating diet now    PLAN:  1. Continue with the dyna-splint RUE and regular exercise and stretching. He appears fairly stable from a  spasticity standpoint today.  2. Repeat botox injections to the RUE focusing on lumbricals and pronators. 400 units  3. Reviewed appropriate exercise techniques. Stressed the importance of technique over speed.  4. Follow up with me in 6 weeks. 15 minutes of face to face patient care time were spent during this visit. All questions were encouraged and answered.

## 2014-06-22 ENCOUNTER — Telehealth: Payer: Self-pay | Admitting: *Deleted

## 2014-06-22 ENCOUNTER — Encounter
Payer: BC Managed Care – PPO | Attending: Physical Medicine & Rehabilitation | Admitting: Physical Medicine & Rehabilitation

## 2014-06-22 ENCOUNTER — Encounter: Payer: Self-pay | Admitting: Physical Medicine & Rehabilitation

## 2014-06-22 VITALS — BP 98/62 | HR 88 | Resp 14 | Ht 68.0 in | Wt 166.0 lb

## 2014-06-22 DIAGNOSIS — I69354 Hemiplegia and hemiparesis following cerebral infarction affecting left non-dominant side: Secondary | ICD-10-CM | POA: Diagnosis not present

## 2014-06-22 DIAGNOSIS — S069X0S Unspecified intracranial injury without loss of consciousness, sequela: Secondary | ICD-10-CM | POA: Diagnosis present

## 2014-06-22 DIAGNOSIS — R531 Weakness: Secondary | ICD-10-CM | POA: Insufficient documentation

## 2014-06-22 DIAGNOSIS — H539 Unspecified visual disturbance: Secondary | ICD-10-CM | POA: Diagnosis not present

## 2014-06-22 DIAGNOSIS — R1310 Dysphagia, unspecified: Secondary | ICD-10-CM | POA: Insufficient documentation

## 2014-06-22 DIAGNOSIS — I10 Essential (primary) hypertension: Secondary | ICD-10-CM | POA: Insufficient documentation

## 2014-06-22 DIAGNOSIS — G935 Compression of brain: Secondary | ICD-10-CM | POA: Diagnosis not present

## 2014-06-22 DIAGNOSIS — Z9689 Presence of other specified functional implants: Secondary | ICD-10-CM | POA: Insufficient documentation

## 2014-06-22 DIAGNOSIS — I638 Other cerebral infarction: Secondary | ICD-10-CM | POA: Diagnosis not present

## 2014-06-22 DIAGNOSIS — G8110 Spastic hemiplegia affecting unspecified side: Secondary | ICD-10-CM

## 2014-06-22 DIAGNOSIS — B181 Chronic viral hepatitis B without delta-agent: Secondary | ICD-10-CM | POA: Diagnosis not present

## 2014-06-22 NOTE — Patient Instructions (Signed)
PLEASE CALL ME WITH ANY PROBLEMS OR QUESTIONS (#[phone]).      

## 2014-06-22 NOTE — Telephone Encounter (Signed)
Opened in error

## 2014-06-22 NOTE — Progress Notes (Signed)
Subjective:    Patient ID: Paul Kane, male    DOB: [birth year], 28 y.o.   MRN: [id number]  HPI  Paul GammaHayri is here for botox injections  Pain Inventory Average Pain no pain Pain Right Now no pain My pain is no pain  In the last 24 hours, has pain interfered with the following? General activity no pain Relation with others no pain Enjoyment of life no pain What TIME of day is your pain at its worst? no pain Sleep (in general) Good  Pain is worse with: no pain Pain improves with: no pain Relief from Meds: no pain  Mobility walk without assistance how many minutes can you walk? 30 ability to climb steps?  yes do you drive?  no Do you have any goals in this area?  yes  Function disabled: date disabled .  Neuro/Psych weakness  Prior Studies Any changes since last visit?  no  Physicians involved in your care Any changes since last visit?  no   Family History  Problem Relation Age of Onset  . Diabetes    . Hyperlipidemia    . Hypertension     History   Social History  . Marital Status: Single    Spouse Name: N/A    Number of Children: N/A  . Years of Education: N/A   Social History Main Topics  . Smoking status: Never Smoker   . Smokeless tobacco: Never Used  . Alcohol Use: No  . Drug Use: No  . Sexual Activity: None   Other Topics Concern  . None   Social History Narrative   Past Surgical History  Procedure Laterality Date  . Cramiectomy    . Ventriculoperitoneal shunt    . Cranioplasty    . Removal of gastrostomy tube     Past Medical History  Diagnosis Date  . Chronic hepatitis B   . Traumatic brain injury   . Intracranial shunt   . Stroke   . Herniation of brain stem   . Paralysis   . Fixed pupils   . Hypertension   . Trouble swallowing   . Visual disturbance   . Weakness   . Intracranial injury of other and unspecified nature, without mention of open intracranial wound, unspecified state of consciousness   . Cerebral thrombosis  with cerebral infarction   . Spastic hemiplegia affecting dominant side    BP 98/62 mmHg  Pulse 88  Resp 14  Ht 5\' 8"  (1.727 m)  Wt 166 lb (75.297 kg)  BMI 25.25 kg/m2  SpO2 99%  Opioid Risk Score:   Fall Risk Score: Low Fall Risk (0-5 points) Review of Systems  Neurological: Positive for weakness.  All other systems reviewed and are negative.      Objective:   Physical Exam Spastic right hemiparesis       Assessment & Plan:  Botox Injection for spasticity using needle EMG guidance Indication: spastic hemiparesis right upper extremity  Dilution: 100 Units/ml        Total Units Injected: 400 Indication: Severe spasticity which interferes with ADL,mobility and/or  hygiene and is unresponsive to medication management and other conservative care Informed consent was obtained after describing risks and benefits of the procedure with the patient. This includes bleeding, bruising, infection, excessive weakness, or medication side effects. A REMS form is on file and signed.  Needle: 50mm injectable monopolar needle electrode  Number of units per muscle   Biceps 100 units Brachioradialis 0 units FCR 25 units FCU 25  units FDS 75 units FDP 75 units FPL 0 units Pronator Teres 100 units Pronator Quadratus 0 units Quadriceps 0 units Gastroc/soleus 0 units Hamstrings 0 units Tibialis Posterior 0 units EHL 0 units All injections were done after obtaining appropriate EMG activity and after negative drawback for blood. The patient tolerated the procedure well. Post procedure instructions were given. A followup appointment was made.

## 2014-09-21 ENCOUNTER — Encounter: Payer: Self-pay | Admitting: Physical Medicine & Rehabilitation

## 2014-09-21 ENCOUNTER — Ambulatory Visit: Payer: BC Managed Care – PPO | Admitting: Physical Medicine & Rehabilitation

## 2014-09-21 ENCOUNTER — Encounter
Payer: BC Managed Care – PPO | Attending: Physical Medicine & Rehabilitation | Admitting: Physical Medicine & Rehabilitation

## 2014-09-21 VITALS — BP 106/63 | HR 80 | Resp 14

## 2014-09-21 DIAGNOSIS — R1310 Dysphagia, unspecified: Secondary | ICD-10-CM | POA: Insufficient documentation

## 2014-09-21 DIAGNOSIS — R531 Weakness: Secondary | ICD-10-CM | POA: Diagnosis not present

## 2014-09-21 DIAGNOSIS — G8190 Hemiplegia, unspecified affecting unspecified side: Secondary | ICD-10-CM

## 2014-09-21 DIAGNOSIS — G935 Compression of brain: Secondary | ICD-10-CM | POA: Diagnosis not present

## 2014-09-21 DIAGNOSIS — I10 Essential (primary) hypertension: Secondary | ICD-10-CM | POA: Insufficient documentation

## 2014-09-21 DIAGNOSIS — Z9689 Presence of other specified functional implants: Secondary | ICD-10-CM | POA: Insufficient documentation

## 2014-09-21 DIAGNOSIS — S069X0S Unspecified intracranial injury without loss of consciousness, sequela: Secondary | ICD-10-CM | POA: Diagnosis present

## 2014-09-21 DIAGNOSIS — I638 Other cerebral infarction: Secondary | ICD-10-CM | POA: Insufficient documentation

## 2014-09-21 DIAGNOSIS — H539 Unspecified visual disturbance: Secondary | ICD-10-CM | POA: Diagnosis not present

## 2014-09-21 DIAGNOSIS — I69354 Hemiplegia and hemiparesis following cerebral infarction affecting left non-dominant side: Secondary | ICD-10-CM | POA: Diagnosis not present

## 2014-09-21 DIAGNOSIS — B181 Chronic viral hepatitis B without delta-agent: Secondary | ICD-10-CM | POA: Insufficient documentation

## 2014-09-21 NOTE — Progress Notes (Signed)
Subjective:    Patient ID: Paul Kane, male    DOB: [birth year], 29 y.o.   MRN: [id number]  HPI  [name] is back regarding his TBI and spastic tetraplegia. He had good results with botox once again in December. We focused on his lumbricals and pronator teres last time. He remains diligent (along with his parents) with his HEP.   He has had some potential seizures which happen often at night and less frequently during the day. They appear to be an absence type potentially. He is seeing neurology and likely will have more EEG testing to discern the presence and/or type of activity which is taking place.   Pain Inventory Average Pain 0 Pain Right Now 0 My pain is no pain  In the last 24 hours, has pain interfered with the following? General activity 4 Relation with others 4 Enjoyment of life 7 What TIME of day is your pain at its worst? no pain Sleep (in general) Fair  Pain is worse with: no pain Pain improves with: no pain Relief from Meds: no pain  Mobility walk without assistance transfers alone  Function disabled: date disabled . I need assistance with the following:  dressing, bathing, meal prep, household duties and shopping  Neuro/Psych trouble walking spasms  Prior Studies Any changes since last visit?  no  Physicians involved in your care Any changes since last visit?  no   Family History  Problem Relation Age of Onset  . Diabetes    . Hyperlipidemia    . Hypertension     History   Social History  . Marital Status: Single    Spouse Name: N/A  . Number of Children: N/A  . Years of Education: N/A   Social History Main Topics  . Smoking status: Never Smoker   . Smokeless tobacco: Never Used  . Alcohol Use: No  . Drug Use: No  . Sexual Activity: Not on file   Other Topics Concern  . None   Social History Narrative   Past Surgical History  Procedure Laterality Date  . Cramiectomy    . Ventriculoperitoneal shunt    . Cranioplasty    .  Removal of gastrostomy tube     Past Medical History  Diagnosis Date  . Chronic hepatitis B   . Traumatic brain injury   . Intracranial shunt   . Stroke   . Herniation of brain stem   . Paralysis   . Fixed pupils   . Hypertension   . Trouble swallowing   . Visual disturbance   . Weakness   . Intracranial injury of other and unspecified nature, without mention of open intracranial wound, unspecified state of consciousness   . Cerebral thrombosis with cerebral infarction   . Spastic hemiplegia affecting dominant side    BP 106/63 mmHg  Pulse 80  Resp 14  SpO2 98%  Opioid Risk Score:   Fall Risk Score: Moderate Fall Risk (6-13 points) Review of Systems  Musculoskeletal:       Spasticity  All other systems reviewed and are negative.      Objective:   Physical Exam  Constitutional: He appears well-developed and well-nourished.  HENT:  Head: Normocephalic.  Eyes: Conjunctivae and EOM are normal. Pupils are equal, round, and reactive to light.  Cardiovascular: Normal rate.  Pulmonary/Chest: Effort normal.  Abdominal: Soft.  Neurological: He is alert. A cranial nerve deficit is present.  dyscongugate gaze, speech severely dysarthric. His volume is good. Right upper extremity strength  is improving with 3-4/5 strength at the shoulder and elbow. He's 1/5 at the wrist and fingers with flexion. Tone: Pronators are 1+/4. Marland Kitchen Biceps BR, 1/4. Right wrist and finger flexors, FPL are 2/4. The right pectoralis major and minor trace. Right lower extremity is grossly 4/5 at the hip and knee. Ankle dorsiflexion and plantar flexion are 3/5-3+ out of 5. He has continued heel cord tightness to the right ankle. He has slight diminishment of his sensory function in both right arm and leg. He ambulated today a bit impulsively but he clears the leg better. Uses steppage and circumduction with gait Cognitively, more alert and aware. communicates with short words and phrases. Speech still very  dysarthric  Skin: Skin is warm and intact  Assessment & Plan:   ASSESSMENT:  1. History of traumatic brain injury with hydrocephalus, meningitis,  and right pontine stroke.  2. Spastic tetraplegia, dominant side more affected 3. severe oropharyngeal dysphagia--improved--tolerating diet now     PLAN:  1. Continue with the dyna-splint RUE and regular exercise and stretching. He appears fairly stable from a spasticity standpoint today.  2. Repeat botox injections to the RUE focusing on finger/wrist flexors/thumb flexor lumbricals and pronators. 400 units  3. Appropriate gait technique/strategies.  4. Follow up with me in 4-6 weeks for injections. 15 minutes of face to face patient care time were spent during this visit. All questions were encouraged and answered.

## 2014-09-21 NOTE — Patient Instructions (Signed)
PLEASE CALL ME WITH ANY PROBLEMS OR QUESTIONS (#[phone]).      

## 2014-10-02 ENCOUNTER — Telehealth: Payer: Self-pay | Admitting: *Deleted

## 2014-10-02 NOTE — Telephone Encounter (Signed)
I spoke with Mr. Paul Kane today regarding options. He  [facility]. Discussed brace options with him. I would be happy to speak with [facility] regarding a hyperextension brace although he may have tried one of these before.  He will let me know

## 2014-10-02 NOTE — Telephone Encounter (Signed)
[name], Semir's father called an dsaid he has been complaining of his knee hurting and would like to speak to DR Riley KillSwartz.  He has some questions about a brace or referral or what should be done.  Ph [phone]

## 2014-10-19 ENCOUNTER — Telehealth: Payer: Self-pay | Admitting: Physical Medicine & Rehabilitation

## 2014-10-24 ENCOUNTER — Encounter: Payer: Self-pay | Admitting: Physical Medicine & Rehabilitation

## 2014-10-29 ENCOUNTER — Encounter: Payer: Self-pay | Admitting: Physical Medicine & Rehabilitation

## 2014-10-29 ENCOUNTER — Encounter
Payer: BC Managed Care – PPO | Attending: Physical Medicine & Rehabilitation | Admitting: Physical Medicine & Rehabilitation

## 2014-10-29 VITALS — BP 101/56 | HR 65 | Resp 14

## 2014-10-29 DIAGNOSIS — G8190 Hemiplegia, unspecified affecting unspecified side: Secondary | ICD-10-CM

## 2014-10-29 DIAGNOSIS — R1310 Dysphagia, unspecified: Secondary | ICD-10-CM | POA: Insufficient documentation

## 2014-10-29 DIAGNOSIS — I638 Other cerebral infarction: Secondary | ICD-10-CM | POA: Diagnosis not present

## 2014-10-29 DIAGNOSIS — B181 Chronic viral hepatitis B without delta-agent: Secondary | ICD-10-CM | POA: Insufficient documentation

## 2014-10-29 DIAGNOSIS — H539 Unspecified visual disturbance: Secondary | ICD-10-CM | POA: Diagnosis not present

## 2014-10-29 DIAGNOSIS — I69354 Hemiplegia and hemiparesis following cerebral infarction affecting left non-dominant side: Secondary | ICD-10-CM | POA: Insufficient documentation

## 2014-10-29 DIAGNOSIS — I10 Essential (primary) hypertension: Secondary | ICD-10-CM | POA: Diagnosis not present

## 2014-10-29 DIAGNOSIS — R531 Weakness: Secondary | ICD-10-CM | POA: Insufficient documentation

## 2014-10-29 DIAGNOSIS — Z9689 Presence of other specified functional implants: Secondary | ICD-10-CM | POA: Diagnosis not present

## 2014-10-29 DIAGNOSIS — G935 Compression of brain: Secondary | ICD-10-CM | POA: Diagnosis not present

## 2014-10-29 DIAGNOSIS — S069X0S Unspecified intracranial injury without loss of consciousness, sequela: Secondary | ICD-10-CM | POA: Insufficient documentation

## 2014-10-29 NOTE — Patient Instructions (Signed)
PLEASE CALL ME WITH ANY PROBLEMS OR QUESTIONS (#[phone]).      

## 2014-10-29 NOTE — Progress Notes (Signed)
Botox Injection for spasticity using needle EMG guidance Indication: spastic hemiplegia affecting dominant side (UE)  Dilution: 100 Units/ml        Total Units Injected: 400 Indication: Severe spasticity which interferes with ADL,mobility and/or  hygiene and is unresponsive to medication management and other conservative care Informed consent was obtained after describing risks and benefits of the procedure with the patient. This includes bleeding, bruising, infection, excessive weakness, or medication side effects. A REMS form is on file and signed.  Needle: 50mm injectable monopolar needle electrode  Number of units per muscle Pectoralis Major 0 units Pectoralis Minor 0 units Biceps 25 units Brachioradialis 100 units FCR 25 units FCU 25 units FDS 75 units FDP 50 units FPL 25 units Pronator Teres 75 units Pronator Quadratus 0 units Quadriceps 0 units Gastroc/soleus 0 units Hamstrings 0 units Tibialis Posterior 0 units EHL 0 units All injections were done after obtaining appropriate EMG activity and after negative drawback for blood. The patient tolerated the procedure well. Post procedure instructions were given. A followup appointment was made.

## 2014-11-06 ENCOUNTER — Ambulatory Visit: Payer: BC Managed Care – PPO | Admitting: Physical Medicine & Rehabilitation

## 2015-01-28 ENCOUNTER — Encounter: Payer: Self-pay | Admitting: Physical Medicine & Rehabilitation

## 2015-01-28 ENCOUNTER — Encounter
Payer: BC Managed Care – PPO | Attending: Physical Medicine & Rehabilitation | Admitting: Physical Medicine & Rehabilitation

## 2015-01-28 VITALS — BP 114/70 | HR 80 | Resp 14

## 2015-01-28 DIAGNOSIS — G935 Compression of brain: Secondary | ICD-10-CM | POA: Diagnosis not present

## 2015-01-28 DIAGNOSIS — S069X0S Unspecified intracranial injury without loss of consciousness, sequela: Secondary | ICD-10-CM

## 2015-01-28 DIAGNOSIS — R1310 Dysphagia, unspecified: Secondary | ICD-10-CM | POA: Insufficient documentation

## 2015-01-28 DIAGNOSIS — H539 Unspecified visual disturbance: Secondary | ICD-10-CM | POA: Insufficient documentation

## 2015-01-28 DIAGNOSIS — G8110 Spastic hemiplegia affecting unspecified side: Secondary | ICD-10-CM | POA: Diagnosis not present

## 2015-01-28 DIAGNOSIS — R531 Weakness: Secondary | ICD-10-CM | POA: Insufficient documentation

## 2015-01-28 DIAGNOSIS — I10 Essential (primary) hypertension: Secondary | ICD-10-CM | POA: Diagnosis not present

## 2015-01-28 DIAGNOSIS — Z9689 Presence of other specified functional implants: Secondary | ICD-10-CM | POA: Diagnosis not present

## 2015-01-28 DIAGNOSIS — I638 Other cerebral infarction: Secondary | ICD-10-CM | POA: Insufficient documentation

## 2015-01-28 DIAGNOSIS — B181 Chronic viral hepatitis B without delta-agent: Secondary | ICD-10-CM | POA: Insufficient documentation

## 2015-01-28 DIAGNOSIS — I69354 Hemiplegia and hemiparesis following cerebral infarction affecting left non-dominant side: Secondary | ICD-10-CM | POA: Diagnosis not present

## 2015-01-28 NOTE — Patient Instructions (Signed)
PLEASE CALL ME WITH ANY PROBLEMS OR QUESTIONS (#[phone]).  HAVE A GOOD DAY!    

## 2015-01-28 NOTE — Progress Notes (Signed)
Subjective:    Patient ID: Paul Kane, male    DOB: [birth year], 29 y.o.   MRN: [id number]  HPI   Paul CarrowHaryi is back regarding his chronic right spastic hemiparesis. He continues to receive good benefit from the botox injections. He works regularly in [employer]. He stretches daily. Parents are supportive. He has [facility] system for the RUE.   Pain Inventory Average Pain 0 Pain Right Now 0 My pain is NO PAIN  In the last 24 hours, has pain interfered with the following? General activity 0 Relation with others 0 Enjoyment of life 0 What TIME of day is your pain at its worst? NO PAIN Sleep (in general) Good  Pain is worse with: NO PAIN Pain improves with: NO PAIN Relief from Meds: 0  Mobility walk without assistance how many minutes can you walk? 10 ability to climb steps?  yes do you drive?  no  Function disabled: date disabled .  Neuro/Psych numbness trouble walking  Prior Studies Any changes since last visit?  no  Physicians involved in your care Any changes since last visit?  no   Family History  Problem Relation Age of Onset  . Diabetes    . Hyperlipidemia    . Hypertension     History   Social History  . Marital Status: Single    Spouse Name: N/A  . Number of Children: N/A  . Years of Education: N/A   Social History Main Topics  . Smoking status: Never Smoker   . Smokeless tobacco: Never Used  . Alcohol Use: No  . Drug Use: No  . Sexual Activity: Not on file   Other Topics Concern  . None   Social History Narrative   Past Surgical History  Procedure Laterality Date  . Cramiectomy    . Ventriculoperitoneal shunt    . Cranioplasty    . Removal of gastrostomy tube     Past Medical History  Diagnosis Date  . Chronic hepatitis B   . Traumatic brain injury   . Intracranial shunt   . Stroke   . Herniation of brain stem   . Paralysis   . Fixed pupils   . Hypertension   . Trouble swallowing   . Visual disturbance   . Weakness   .  Intracranial injury of other and unspecified nature, without mention of open intracranial wound, unspecified state of consciousness   . Cerebral thrombosis with cerebral infarction   . Spastic hemiplegia affecting dominant side    BP 114/70 mmHg  Pulse 80  Resp 14  SpO2 96%  Opioid Risk Score:   Fall Risk Score: Low Fall Risk (0-5 points)`1  Depression screen PHQ 2/9  Depression screen PHQ 2/9 10/29/2014  Decreased Interest 1  Down, Depressed, Hopeless 1  PHQ - 2 Score 2  Altered sleeping 2  Tired, decreased energy 1  Change in appetite 1  Feeling bad or failure about yourself  3  Trouble concentrating 2  Moving slowly or fidgety/restless 1  Suicidal thoughts 1  PHQ-9 Score 13     Review of Systems  Constitutional: Negative.   HENT: Negative.   Eyes: Negative.   Respiratory: Negative.   Cardiovascular: Negative.   Gastrointestinal: Negative.   Endocrine: Negative.   Genitourinary: Negative.   Musculoskeletal: Negative.   Skin: Negative.   Allergic/Immunologic: Negative.   Neurological: Positive for numbness.       Trouble walking  Hematological: Negative.   Psychiatric/Behavioral: Positive for confusion and decreased concentration.  Objective:   Physical Exam  Constitutional: He appears well-developed and well-nourished.  HENT:  Head: Normocephalic.  Eyes: Conjunctivae and EOM are normal. Pupils are equal, round, and reactive to light.  Cardiovascular: Normal rate.  Pulmonary/Chest: Effort normal.  Abdominal: Soft.  Neurological: He is alert. A cranial nerve deficit is present.  dyscongugate gaze, speech severely dysarthric but better. His volume is good. Right upper extremity strength is improving with 3-4/5 strength at the shoulder and elbow. He's 1+/5 at the wrist and fingers with flexion. Tone: Pronators are 1+-2/4. Marland Kitchen Biceps BR are 2/4, 1+-2/4. Right wrist and finger flexors, FPL are 2/4. The right pectoralis major and minor trace. Right lower  extremity is grossly 4/5 at the hip and knee. Ankle dorsiflexion and plantar flexion are 3/5-3+ out of 5. He has continued heel cord tightness to the right ankle. He has slight diminishment of his sensory function in both right arm and leg. He ambulated today a bit impulsively but he clears the leg better. Uses steppage and circumduction with gait Cognitively, more alert and aware. communicates with short words and phrases. Speech still very dysarthric  Skin: Skin is warm and intact  Assessment & Plan:   ASSESSMENT:  1. History of traumatic brain injury with hydrocephalus, meningitis,  and right pontine stroke.  2. Spastic tetraplegia, dominant side more affected 3. severe oropharyngeal dysphagia--improved--tolerating diet now     PLAN:  1. Continue with the dyna-splint RUE and regular exercise and stretching.   2. Dysport to  RUE focusing on finger/wrist flexors/thumb flexor lumbricals and pronators. 1000u 3. Appropriate gait technique/strategies to continue.  4. Follow up with me in 4-6 weeks for injections. 15 minutes of face to face patient care time were spent during this visit. All questions were encouraged and answered.

## 2015-01-28 NOTE — Addendum Note (Signed)
Addended by: Faith RogueSWARTZ, Arlana Canizales T on: 01/28/2015 12:35 PM   Modules accepted: Level of Service

## 2015-03-19 ENCOUNTER — Emergency Department (HOSPITAL_COMMUNITY): Payer: BC Managed Care – PPO

## 2015-03-19 ENCOUNTER — Encounter (HOSPITAL_COMMUNITY): Payer: Self-pay | Admitting: General Practice

## 2015-03-19 ENCOUNTER — Emergency Department (HOSPITAL_COMMUNITY)
Admission: EM | Admit: 2015-03-19 | Discharge: 2015-03-19 | Disposition: A | Discharge status: Home / Self Care | Payer: BC Managed Care – PPO | Attending: Emergency Medicine | Admitting: Emergency Medicine

## 2015-03-19 DIAGNOSIS — S24109A Unspecified injury at unspecified level of thoracic spinal cord, initial encounter: Secondary | ICD-10-CM | POA: Diagnosis not present

## 2015-03-19 DIAGNOSIS — S0083XA Contusion of other part of head, initial encounter: Secondary | ICD-10-CM | POA: Diagnosis not present

## 2015-03-19 DIAGNOSIS — I10 Essential (primary) hypertension: Secondary | ICD-10-CM | POA: Insufficient documentation

## 2015-03-19 DIAGNOSIS — Y9389 Activity, other specified: Secondary | ICD-10-CM | POA: Insufficient documentation

## 2015-03-19 DIAGNOSIS — Z8669 Personal history of other diseases of the nervous system and sense organs: Secondary | ICD-10-CM | POA: Diagnosis not present

## 2015-03-19 DIAGNOSIS — S0093XA Contusion of unspecified part of head, initial encounter: Secondary | ICD-10-CM

## 2015-03-19 DIAGNOSIS — Z8619 Personal history of other infectious and parasitic diseases: Secondary | ICD-10-CM | POA: Insufficient documentation

## 2015-03-19 DIAGNOSIS — Z87820 Personal history of traumatic brain injury: Secondary | ICD-10-CM | POA: Diagnosis not present

## 2015-03-19 DIAGNOSIS — Z79899 Other long term (current) drug therapy: Secondary | ICD-10-CM | POA: Diagnosis not present

## 2015-03-19 DIAGNOSIS — Y998 Other external cause status: Secondary | ICD-10-CM | POA: Diagnosis not present

## 2015-03-19 DIAGNOSIS — M546 Pain in thoracic spine: Secondary | ICD-10-CM

## 2015-03-19 DIAGNOSIS — S0990XA Unspecified injury of head, initial encounter: Secondary | ICD-10-CM | POA: Diagnosis present

## 2015-03-19 DIAGNOSIS — Z982 Presence of cerebrospinal fluid drainage device: Secondary | ICD-10-CM | POA: Diagnosis not present

## 2015-03-19 DIAGNOSIS — W1839XA Other fall on same level, initial encounter: Secondary | ICD-10-CM | POA: Diagnosis not present

## 2015-03-19 DIAGNOSIS — Y92002 Bathroom of unspecified non-institutional (private) residence single-family (private) house as the place of occurrence of the external cause: Secondary | ICD-10-CM | POA: Insufficient documentation

## 2015-03-19 DIAGNOSIS — W19XXXA Unspecified fall, initial encounter: Secondary | ICD-10-CM

## 2015-03-19 NOTE — ED Notes (Addendum)
Dr. Jeraldine Loots at bedside to remove pts C-collar.

## 2015-03-19 NOTE — ED Notes (Signed)
Pt. Left with all belongings. Discharge instructions were reviewed and all questions were answered.  

## 2015-03-19 NOTE — ED Provider Notes (Signed)
CSN: [id number]     Arrival date & time 03/19/15  1641 History   First MD Initiated Contact with Patient 03/19/15 1646     Chief Complaint  Patient presents with  . Fall   Patient is a 29 y.o. male presenting with general illness. The history is provided by a relative and the patient. No language interpreter was used.  Illness Location:  NA Quality:  Fall Severity:  Mild Onset quality:  Sudden Timing:  Constant Progression:  Unable to specify Chronicity:  New Context:  Remote severe TBI with resulting ICP & spastic hemiplegia now status post VP shunt placement presenting for ground-level fall in bathroom one hour prior to arrival. Patient presenting with family. States she was outside bathroom when she heard a crash. Wanted to bathroom to find patient on the floor. No loss of consciousness. Other noted small bruise to right forehead near VP shunt site and brought patient into the ED for this reason. Per family, patient has been acting normally and they deny N/V Associated symptoms: headaches   Associated symptoms: no abdominal pain, no chest pain, no congestion, no fever, no loss of consciousness, no nausea, no shortness of breath and no vomiting     Past Medical History  Diagnosis Date  . Chronic hepatitis B   . Traumatic brain injury   . Intracranial shunt   . Stroke   . Herniation of brain stem   . Paralysis   . Fixed pupils   . Hypertension   . Trouble swallowing   . Visual disturbance   . Weakness   . Intracranial injury of other and unspecified nature, without mention of open intracranial wound, unspecified state of consciousness   . Cerebral thrombosis with cerebral infarction   . Spastic hemiplegia affecting dominant side    Past Surgical History  Procedure Laterality Date  . Cramiectomy    . Ventriculoperitoneal shunt    . Cranioplasty    . Removal of gastrostomy tube     Family History  Problem Relation Age of Onset  . Diabetes    . Hyperlipidemia    .  Hypertension     Social History  Substance Use Topics  . Smoking status: Never Smoker   . Smokeless tobacco: Never Used  . Alcohol Use: No    Review of Systems  Constitutional: Negative for fever.  HENT: Negative for congestion.   Respiratory: Negative for shortness of breath.   Cardiovascular: Negative for chest pain.  Gastrointestinal: Negative for nausea, vomiting and abdominal pain.  Neurological: Positive for headaches. Negative for dizziness, tremors, seizures, loss of consciousness, syncope, facial asymmetry, speech difficulty, weakness, light-headedness and numbness.  All other systems reviewed and are negative.   Allergies  Review of patient's allergies indicates no known allergies.  Home Medications   Prior to Admission medications   Medication Sig Start Date End Date Taking? Authorizing Provider  levETIRAcetam (KEPPRA) 100 MG/ML solution Take 500 mg by mouth 2 (two) times daily.   Yes Historical Provider, MD   BP 111/76 mmHg  Pulse 76  Temp(Src) 97.7 F (36.5 C) (Oral)  Resp 18  SpO2 98%   Physical Exam  Constitutional: No distress.  HENT:  Postsurgical changes noted to bilateral scalp, VP shunt in place to right lateral scalp with mild hematoma inferior to this  Eyes: Conjunctivae are normal. Pupils are equal, round, and reactive to light.  Neck: Normal range of motion. Neck supple.  Cardiovascular: Normal rate and intact distal pulses.   Pulmonary/Chest: Effort  normal and breath sounds normal.  Abdominal: Soft. Bowel sounds are normal. He exhibits no distension. There is no tenderness. There is no rebound.  Musculoskeletal: Normal range of motion.  Neurological: He is alert. He has normal reflexes. No cranial nerve deficit. Coordination normal.  Neuro exam difficult given the patient's developmental delay. Cranial nerves grossly intact.  Skin: Skin is warm and dry. He is not diaphoretic.    ED Course  Procedures   Labs Review Labs Reviewed - No data  to display  Imaging Review Dg Thoracic Spine 2 View  03/19/2015   CLINICAL DATA:  Status post fall on the bathroom with back pain.  EXAM: THORACIC SPINE 2 VIEWS  COMPARISON:  None.  FINDINGS: There is no evidence of thoracic spine fracture. Alignment is normal. No other significant bone abnormalities are identified.  IMPRESSION: Negative.   Electronically Signed   By: Sherian Rein M.D.   On: 03/19/2015 18:04   Ct Head Wo Contrast  03/19/2015   CLINICAL DATA:  Fall, right-sided headache  EXAM: CT HEAD WITHOUT CONTRAST  TECHNIQUE: Contiguous axial images were obtained from the base of the skull through the vertex without intravenous contrast.  COMPARISON:  12/08/2012  FINDINGS: Marked left hemispheric encephalomalacia with ex vacuo dilatation of the left lateral ventricle reidentified. Stable degree of right lateral ventricular dilatation out of proportion to the degree of mild cortical volume loss. Right-sided approach ventriculoperitoneal shunt in place. Tube is contiguous in its visualized aspects. No midline shift. No acute hemorrhage, infarct, or mass lesion is identified. Moderate pansinusitis, new since prior exam. Evidence of left craniotomy.  IMPRESSION: Chronic findings as above without acute intracranial abnormality.  Moderate pansinusitis.   Electronically Signed   By: Christiana Pellant M.D.   On: 03/19/2015 19:30   I have personally reviewed and evaluated these images and lab results as part of my medical decision-making.   EKG Interpretation   Date/Time:  Tuesday March 19 2015 17:10:34 EDT Ventricular Rate:  75 PR Interval:  140 QRS Duration: 91 QT Interval:  367 QTC Calculation: 410 R Axis:   93 Text Interpretation:  Sinus rhythm Borderline right axis deviation ST  elev, probable normal early repol pattern Baseline wander in lead(s) V6  Sinus rhythm No significant change since last tracing Early repolarization  pattern Borderline ECG Confirmed by Gerhard Munch  MD [phone]) on   03/19/2015 7:48:24 PM      MDM  Paul Kane is a 29 yo male with PMHx of remote severe TBI with resulting ICP & spastic hemiplegia now status post VP shunt placement presenting for ground-level fall in bathroom one hour prior to arrival. Patient presenting with family. States she was outside bathroom when she heard a crash. Wanted to bathroom to find patient on the floor. No loss of consciousness. Other noted small bruise to right forehead near VP shunt site and brought patient into the ED for this reason. Per family, patient has been acting normally and they deny any episodes of emesis.  Exam above notable for young male lying in stretcher in no acute distress. Afebrile. Not tachycardic. Not tachypneic. Breathing well on room air and maintaining saturations without supplemental oxygen. Normotensive. Neuro exam difficult given the patient's developmental delay. Cranial nerves grossly intact. Small hematoma to right scalp inferior to site of VP shunt - no ecchymoses, lacerations, or abrasions.  X-ray thoracic spine show no acute fracture or malalignment. CT head showing no acute intracranial abnormality.  Patient discharged home in stable condition. Strict he  returned consciousness. Patient's family understands and agrees plan and has no further questions concerning this time  Patient care discussed with him followed by my attending Dr. Jeraldine Loots   Final diagnoses:  Fall, initial encounter  Traumatic hematoma of head, initial encounter  Bilateral thoracic back pain    Angelina Ok, MD 03/20/15 1610  Gerhard Munch, MD 03/20/15 [phone]

## 2015-03-19 NOTE — ED Notes (Signed)
Pt brought in via [facility] after a fall. Pt fell in the bathroom from a standing position. Pt was found down in between the vanity and toilet on his right side. No loss of consciousness. Pt reporting a  Right sided headache, and right arm pain. Pt has a history of a traumatic brain injury. Pt has a head shunt. Pt is at baseline per family, and is normally nonverbal besides yes/no.

## 2015-03-22 ENCOUNTER — Encounter: Payer: BC Managed Care – PPO | Admitting: Physical Medicine & Rehabilitation

## 2015-03-27 ENCOUNTER — Encounter: Payer: Self-pay | Admitting: Physical Medicine & Rehabilitation

## 2015-03-27 ENCOUNTER — Encounter
Payer: BC Managed Care – PPO | Attending: Physical Medicine & Rehabilitation | Admitting: Physical Medicine & Rehabilitation

## 2015-03-27 VITALS — BP 96/58 | HR 82

## 2015-03-27 DIAGNOSIS — G8190 Hemiplegia, unspecified affecting unspecified side: Secondary | ICD-10-CM

## 2015-03-27 DIAGNOSIS — I638 Other cerebral infarction: Secondary | ICD-10-CM | POA: Insufficient documentation

## 2015-03-27 DIAGNOSIS — B181 Chronic viral hepatitis B without delta-agent: Secondary | ICD-10-CM | POA: Diagnosis not present

## 2015-03-27 DIAGNOSIS — Z9689 Presence of other specified functional implants: Secondary | ICD-10-CM | POA: Insufficient documentation

## 2015-03-27 DIAGNOSIS — S069X0S Unspecified intracranial injury without loss of consciousness, sequela: Secondary | ICD-10-CM | POA: Diagnosis present

## 2015-03-27 DIAGNOSIS — R531 Weakness: Secondary | ICD-10-CM | POA: Insufficient documentation

## 2015-03-27 DIAGNOSIS — H539 Unspecified visual disturbance: Secondary | ICD-10-CM | POA: Diagnosis not present

## 2015-03-27 DIAGNOSIS — I69354 Hemiplegia and hemiparesis following cerebral infarction affecting left non-dominant side: Secondary | ICD-10-CM | POA: Diagnosis not present

## 2015-03-27 DIAGNOSIS — R1310 Dysphagia, unspecified: Secondary | ICD-10-CM | POA: Diagnosis not present

## 2015-03-27 DIAGNOSIS — G935 Compression of brain: Secondary | ICD-10-CM | POA: Diagnosis not present

## 2015-03-27 DIAGNOSIS — I10 Essential (primary) hypertension: Secondary | ICD-10-CM | POA: Insufficient documentation

## 2015-03-27 NOTE — Progress Notes (Signed)
Botolinum toxin (dysport) Injection for spasticity using needle EMG guidance Indication: spastic hemiplegia affecting dominant side (UE)  Dilution:  500 Units/ml Total Units Injected: 1000u Indication: Severe spasticity which interferes with ADL,mobility and/or hygiene and is unresponsive to medication management and other conservative care Informed consent was obtained after describing risks and benefits of the procedure with the patient. This includes bleeding, bruising, infection, excessive weakness, or medication side effects. A REMS form is on file and signed.  Needle: 50mm injectable monopolar needle electrode  Number of units per muscle Pectoralis Major 0 units Pectoralis Minor 0 units Biceps 250 units Brachioradialis 100 units FCR  25 units FCU 25 units FDS 250 units FDP 250 units FPL 0 units Pronator Teres 100 units Pronator Quadratus 0 units Quadriceps 0 units Gastroc/soleus 0 units Hamstrings 0 units Tibialis Posterior 0 units EHL 0 units All injections were done after obtaining appropriate EMG activity and after negative drawback for blood. The patient tolerated the procedure well. Post procedure instructions were given. A followup appointment was made.

## 2015-03-27 NOTE — Patient Instructions (Signed)
PLEASE CALL ME WITH ANY PROBLEMS OR QUESTIONS (#[phone]).  HAVE A GOOD DAY!    

## 2015-04-10 ENCOUNTER — Telehealth: Payer: Self-pay | Admitting: *Deleted

## 2015-04-10 DIAGNOSIS — G8110 Spastic hemiplegia affecting unspecified side: Secondary | ICD-10-CM

## 2015-04-10 NOTE — Telephone Encounter (Signed)
Mr Paul Kane called. Paul Kane's splint broke.  It was from [facility] ([facility])  They need a new order from Dr Riley Kill

## 2015-04-10 NOTE — Telephone Encounter (Signed)
i would be happy to order. I meant to do it at the office today, but it slipped my mind. Please remind me on Friday---thanks

## 2015-04-12 NOTE — Telephone Encounter (Signed)
I faxed an order to [facility] for a EZ pro/sup for his right upper extremity.

## 2015-04-12 NOTE — Telephone Encounter (Signed)
Order written

## 2015-06-25 ENCOUNTER — Encounter
Payer: BC Managed Care – PPO | Attending: Physical Medicine & Rehabilitation | Admitting: Physical Medicine & Rehabilitation

## 2015-06-25 DIAGNOSIS — B181 Chronic viral hepatitis B without delta-agent: Secondary | ICD-10-CM | POA: Insufficient documentation

## 2015-06-25 DIAGNOSIS — I69354 Hemiplegia and hemiparesis following cerebral infarction affecting left non-dominant side: Secondary | ICD-10-CM | POA: Insufficient documentation

## 2015-06-25 DIAGNOSIS — G935 Compression of brain: Secondary | ICD-10-CM | POA: Insufficient documentation

## 2015-06-25 DIAGNOSIS — S069X0S Unspecified intracranial injury without loss of consciousness, sequela: Secondary | ICD-10-CM | POA: Insufficient documentation

## 2015-06-25 DIAGNOSIS — I10 Essential (primary) hypertension: Secondary | ICD-10-CM | POA: Insufficient documentation

## 2015-06-25 DIAGNOSIS — Z9689 Presence of other specified functional implants: Secondary | ICD-10-CM | POA: Insufficient documentation

## 2015-06-25 DIAGNOSIS — R1310 Dysphagia, unspecified: Secondary | ICD-10-CM | POA: Insufficient documentation

## 2015-06-25 DIAGNOSIS — H539 Unspecified visual disturbance: Secondary | ICD-10-CM | POA: Insufficient documentation

## 2015-06-25 DIAGNOSIS — I638 Other cerebral infarction: Secondary | ICD-10-CM | POA: Insufficient documentation

## 2015-06-25 DIAGNOSIS — R531 Weakness: Secondary | ICD-10-CM | POA: Insufficient documentation

## 2015-09-03 ENCOUNTER — Encounter: Payer: Self-pay | Admitting: Physical Medicine & Rehabilitation

## 2015-09-03 ENCOUNTER — Encounter
Payer: BC Managed Care – PPO | Attending: Physical Medicine & Rehabilitation | Admitting: Physical Medicine & Rehabilitation

## 2015-09-03 VITALS — BP 106/68 | HR 80 | Resp 14

## 2015-09-03 DIAGNOSIS — R1310 Dysphagia, unspecified: Secondary | ICD-10-CM | POA: Insufficient documentation

## 2015-09-03 DIAGNOSIS — H539 Unspecified visual disturbance: Secondary | ICD-10-CM | POA: Diagnosis not present

## 2015-09-03 DIAGNOSIS — R531 Weakness: Secondary | ICD-10-CM | POA: Diagnosis not present

## 2015-09-03 DIAGNOSIS — I638 Other cerebral infarction: Secondary | ICD-10-CM | POA: Diagnosis present

## 2015-09-03 DIAGNOSIS — G8110 Spastic hemiplegia affecting unspecified side: Secondary | ICD-10-CM | POA: Diagnosis not present

## 2015-09-03 DIAGNOSIS — I69354 Hemiplegia and hemiparesis following cerebral infarction affecting left non-dominant side: Secondary | ICD-10-CM | POA: Insufficient documentation

## 2015-09-03 DIAGNOSIS — Z9689 Presence of other specified functional implants: Secondary | ICD-10-CM | POA: Diagnosis not present

## 2015-09-03 DIAGNOSIS — S069X0S Unspecified intracranial injury without loss of consciousness, sequela: Secondary | ICD-10-CM | POA: Diagnosis present

## 2015-09-03 DIAGNOSIS — I10 Essential (primary) hypertension: Secondary | ICD-10-CM | POA: Insufficient documentation

## 2015-09-03 DIAGNOSIS — G935 Compression of brain: Secondary | ICD-10-CM | POA: Diagnosis not present

## 2015-09-03 DIAGNOSIS — B181 Chronic viral hepatitis B without delta-agent: Secondary | ICD-10-CM | POA: Insufficient documentation

## 2015-09-03 NOTE — Progress Notes (Signed)
Dysport Injection for spasticity using needle EMG guidance Indication: G81.10  Dilution: 250 Units/ml        Total Units Injected: 1000 Indication: Severe spasticity which interferes with ADL,mobility and/or  hygiene and is unresponsive to medication management and other conservative care Informed consent was obtained after describing risks and benefits of the procedure with the patient. This includes bleeding, bruising, infection, excessive weakness, or medication side effects. A REMS form is on file and signed.  Needle: 50mm injectable monopolar needle electrode  Number of units per muscle Pectoralis Major 0 units Pectoralis Minor 0 units Biceps 0 units Brachioradialis 0 units FCR 75 units FCU 75 units FDS 250 units FDP 250 units FPL 100 units Pronator Teres 150 units Pronator Quadratus 100 units Quadriceps 0 units Gastroc/soleus 0 units Hamstrings 0 units Tibialis Posterior 0 units EHL 0 units All injections were done after obtaining appropriate EMG activity and after negative drawback for blood. The patient tolerated the procedure well. Post procedure instructions were given. A followup appointment was made. A referral to outpt OT was made as welll

## 2015-09-03 NOTE — Patient Instructions (Signed)
  PLEASE CALL ME WITH ANY PROBLEMS OR QUESTIONS (#[phone]).      

## 2015-09-04 ENCOUNTER — Ambulatory Visit: Payer: BC Managed Care – PPO | Admitting: Physical Medicine & Rehabilitation

## 2015-09-09 ENCOUNTER — Encounter: Payer: Self-pay | Admitting: Rehabilitative and Restorative Service Providers"

## 2015-09-09 ENCOUNTER — Ambulatory Visit
Payer: BC Managed Care – PPO | Attending: Physical Medicine & Rehabilitation | Admitting: Rehabilitative and Restorative Service Providers"

## 2015-09-09 DIAGNOSIS — M6289 Other specified disorders of muscle: Secondary | ICD-10-CM

## 2015-09-09 DIAGNOSIS — M6249 Contracture of muscle, multiple sites: Secondary | ICD-10-CM | POA: Insufficient documentation

## 2015-09-09 DIAGNOSIS — R531 Weakness: Secondary | ICD-10-CM | POA: Diagnosis present

## 2015-09-09 DIAGNOSIS — R269 Unspecified abnormalities of gait and mobility: Secondary | ICD-10-CM | POA: Diagnosis present

## 2015-09-09 NOTE — Therapy (Signed)
[facility] [address] [place], [place], [address] Phone: [phone]   Fax:  [phone]  Physical Therapy Evaluation  Patient Details  Name: Paul Kane MRN: [id number] Date of Birth: 04-04-1986 Referring Provider: Daneil Dan  Encounter Date: 09/09/2015      PT End of Session - 09/09/15 1229    Visit Number 1   Number of Visits 5   Date for PT Re-Evaluation 10/09/15   Authorization Type [employer] PPO   PT Start Time 0930   PT Stop Time 1014   PT Time Calculation (min) 44 min   Equipment Utilized During Treatment Gait belt   Behavior During Therapy [facility] for tasks assessed/performed  Slightly impulsive      Past Medical History  Diagnosis Date  . Chronic hepatitis B (HCC)   . Traumatic brain injury (HCC)   . Intracranial shunt   . Stroke (HCC)   . 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HCC)   . Paralysis (HCC)   . Fixed pupils   . Hypertension   . Trouble swallowing   . Visual disturbance   . Weakness   . Intracranial injury of other and unspecified nature, without mention of open intracranial wound, unspecified state of consciousness   . 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HCC)   . Spastic hemiplegia affecting dominant side [facility])     Past Surgical History  Procedure Laterality Date  . Cramiectomy    . Ventriculoperitoneal shunt    . Cranioplasty    . Removal of gastrostomy tube      There were no vitals filed for this visit.  Visit Diagnosis:  Abnormality of gait  Generalized weakness  Muscle hypertonicity      Subjective Assessment - 09/09/15 0939    Subjective Pt is a 30 y.o. M with history of TBI and seizures. Pt has been receiving Botox injections in the R UE. Pt goes to [facility] for exercise. Pt had fall in August going to the bathroom where he had nothing to hold onto, hit head and hurt low back, but no serious injuries sustained. Mother reports pt has difficulty bending  forward and reaching out of BOS as with making the bed and picking up objects off of the floor.   Patient is accompained by: Family member  Mother   Patient Stated Goals "I would like him to improve his current status."   Currently in Pain? No/denies            [facility] PT Assessment - 09/09/15 1057    Assessment   Medical Diagnosis Spastic R hemiparesis after stroke/TBI   Prior Therapy PT, OT, SLP in past   Precautions   Precautions Fall   Required Braces or Orthoses Other Brace/Splint   Other Brace/Splint R wrist cock up splint; R AFO pt does not wear   Restrictions   Weight Bearing Restrictions No   Home Environment   Living Environment Private residence   Living Arrangements Parent  Mother, father   Available Help at Discharge Family   Type of Home House   Home Layout Two level   Prior Function   Level of Independence Independent with gait   Observation/Other Assessments   Observations R UE held in elbow flexion, R wrist flexion contracture, various R finger deformities - swan neck at digits 2,3, thumb maintained in IP extension   Tone   Assessment Location Right Upper Extremity;Right Lower Extremity   ROM / Strength   AROM / PROM /  Strength Strength;PROM;AROM   AROM   Overall AROM  Other (comment)   Overall AROM Comments LE AROM WFL. R UE AROM grossly limited   PROM   Overall PROM  Other (comment)   Overall PROM Comments R UE ROM grossly limited. Unable to achieve R wrist extension past neutral.   Strength   Overall Strength Other (comment)   Overall Strength Comments R shoulder ABD 4/5, R elbow flex 4/5, R elbow ext 5/5, R wrist flex 4-/5, R wrist ext unable to actively extend. L UE WFL. Bilateral LE WFL, R plantarflexor grossly 4-/5 - pt able to perform bilateral heel raise x10 with plantarflexion/inv of R LE. Isolated testing of R glute max 4/5, R hip abd performed in sidelying grossly 4/5 - pt recruits hip flexors for movement.   Transfers   Transfers Floor to  Transfer   Floor to Transfer With upper extremity assist;4: Min guard   Comments Pt demonstrates ability to transfer seated on mat table <> single kneeling <> tall kneeling on floor <> quadruped <> prone laying with min guard. Pt able to transfer prone lying <> quadruped <> half kneel with <> to seated edge of mat table using single UE support on mat table with min guard for pt safety.I   Ambulation/Gait   Ambulation/Gait Yes   Ambulation/Gait Assistance 6: Modified independent (Device/Increase time)   Ambulation Distance (Feet) 50 Feet   Assistive device None   Gait Pattern Decreased arm swing - right;Right circumduction;Right steppage;Right genu recurvatum   Ambulation Surface Level;Indoor   Stairs Yes   Stairs Assistance 6: Modified independent (Device/Increase time)   Stair Management Technique One rail Left;Alternating pattern   Number of Stairs 4  x2   Gait Comments Pt amb mod I for inc time. Maintains R elbow/wrist flexion throughout. R steppage/circumduction to improve R foot clearance.   Balance   Balance Assessed Yes   Static Standing Balance   Static Standing - Balance Support No upper extremity supported   Static Standing - Level of Assistance 5: Stand by assistance   Static Standing Balance -  Activities  Single Leg Stance - Right Leg;Single Leg Stance - Left Leg   Static Standing - Comment/# of Minutes Pt unable to perform single leg stance >3 sec bilaterally without UE support    RUE Tone   RUE Tone Severe;Modified Ashworth   RUE Tone   Modified Ashworth Scale for Grading Hypertonia RUE Affected part(s) rigid in flexion or extension   RLE Tone   RLE Tone Modified Ashworth   RLE Tone   Modified Ashworth Scale for Grading Hypertonia RLE More marked increase in muscle tone through most of the ROM, but affected part(s) easily moved           PT Short Term Goals - 09/09/15 1230    PT SHORT TERM GOAL #1   Title STGs = LTGs           PT Long Term Goals - 09/09/15  1231    PT LONG TERM GOAL #1   Title The patient will demonstrate initiation and completion of HEP with supervision from caregiver at home and at [facility] to maximize functional gains made in PT.   Time 4   Period Weeks   Status New   PT LONG TERM GOAL #2   Title The patient will move object from floor to countertop x5 mod I to demonstrate ability to reach for object on floor for safe performance of ADLs.   PT LONG  TERM GOAL #3   Title The patient will ambulate 500' outdoors mod I over varying surfaces and surface heights including grass, curbs, and inclines to demonstrate improved functional mobility with community ambulation.            Plan - 09/09/15 1218    Clinical Impression Statement The patient is a 30 y.o. M with history of TBI and seizures presenting today for R spastic hemiplegia following recent botox injections. The patient demonstrates decreased strength in the R LE, increased tone in the R UE/R LE, and abnormality of gait which are affecting his ability to bend forward to pick objects up and perform ADLs like making the bed, and perform floor transfers without min guard. The patient would benefit from skilled PT 1x/week for 4 weeks in order to address the above impairments and regain prior level of function.   Pt will benefit from skilled therapeutic intervention in order to improve on the following deficits Abnormal gait;Decreased balance;Decreased mobility;Decreased range of motion;Decreased strength;Difficulty walking;Increased muscle spasms;Impaired flexibility;Impaired tone;Impaired UE functional use;Postural dysfunction   Rehab Potential Good   Clinical Impairments Affecting Rehab Potential Good support system from family   PT Frequency 1x / week   PT Duration 4 weeks   PT Treatment/Interventions ADLs/Self Care Home Management;Moist Heat;DME Instruction;Gait training;Stair training;Functional mobility training;Therapeutic activities;Therapeutic exercise;Balance  training;Neuromuscular re-education;Patient/family education;Orthotic Fit/Training;Manual techniques;Passive range of motion;Vestibular   PT Next Visit Plan Initiate HEP for home/gym - hip abd/ER strength, plantarflexor strength, R UE stretching?, ankle strengthening   Consulted and Agree with Plan of Care Patient       Problem List Patient Active Problem List   Diagnosis Date Noted  . 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 Deafness, sensorineural 04/20/2012  . Buzzing in ear 04/20/2012  . Leaking percutaneous endoscopic gastrostomy (PEG) tube (HCC) 02/04/2012  . Traumatic brain injury (HCC) 10/12/2011  . Cerebral infarct (HCC) 10/12/2011  . Spastic hemiplegia affecting dominant side (HCC) 10/12/2011  . Dysphagia 10/12/2011  . Functioning G-tube 03/24/2011  . ASPIRATION PNEUMONIA 10/06/2010  . 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 HEMIPARESIS, DOMINANT SIDE, RIGHT 07/28/2010  . CEREBRAL HEMORRHAGE 07/28/2010  . PERSONAL HISTORY OF TRAUMATIC BRAIN INJURY 07/02/2010  . ALLERGIC RHINITIS CAUSE UNSPECIFIED 10/25/2008  . GASTROENTERITIS 08/09/2008  . CONTACT DERMATITIS&OTHER ECZEMA DUE DETERGENTS 08/18/2007  . HEPATITIS B, CHRONIC 06/28/2007    Celene Squibb, SPT  09/09/2015, 12:47 PM  [facility] [address] [place], [place], [address] Phone: [phone]   Fax:  [phone]  Name: Paul Kane MRN: [id number] Date of Birth: 10-07-1985

## 2015-09-20 ENCOUNTER — Ambulatory Visit
Payer: BC Managed Care – PPO | Attending: Physical Medicine & Rehabilitation | Admitting: Rehabilitative and Restorative Service Providers"

## 2015-09-20 DIAGNOSIS — M6249 Contracture of muscle, multiple sites: Secondary | ICD-10-CM | POA: Diagnosis not present

## 2015-09-20 DIAGNOSIS — R531 Weakness: Secondary | ICD-10-CM | POA: Diagnosis present

## 2015-09-20 DIAGNOSIS — R269 Unspecified abnormalities of gait and mobility: Secondary | ICD-10-CM | POA: Diagnosis present

## 2015-09-20 DIAGNOSIS — M6289 Other specified disorders of muscle: Secondary | ICD-10-CM

## 2015-09-20 NOTE — Therapy (Signed)
[facility] [address] [place], [place], [address] Phone: [phone]   Fax:  [phone]  Physical Therapy Treatment  Patient Details  Name: Paul HumbleHayri E Askren MRN: [id number] Date of Birth: 1986/04/05 Referring Provider: Daneil DanSwartz, Zachary J.  Encounter Date: 09/20/2015      PT End of Session - 09/20/15 1014    Visit Number 2   Number of Visits 5   Date for PT Re-Evaluation 10/09/15   Authorization Type [employer] PPO   PT Start Time [phone]   PT Stop Time 0930   PT Time Calculation (min) 43 min   Activity Tolerance Patient tolerated treatment well   Behavior During Therapy [facility] for tasks assessed/performed      Past Medical History  Diagnosis Date  . Chronic hepatitis B (HCC)   . Traumatic brain injury (HCC)   . Intracranial shunt   . Stroke (HCC)   . 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HCC)   . Paralysis (HCC)   . Fixed pupils   . Hypertension   . Trouble swallowing   . Visual disturbance   . Weakness   . Intracranial injury of other and unspecified nature, without mention of open intracranial wound, unspecified state of consciousness   . 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HCC)   . Spastic hemiplegia affecting dominant side [facility](HCC)     Past Surgical History  Procedure Laterality Date  . Cramiectomy    . Ventriculoperitoneal shunt    . Cranioplasty    . Removal of gastrostomy tube      There were no vitals filed for this visit.  Visit Diagnosis:  Muscle hypertonicity  Generalized weakness  Abnormality of gait      Subjective Assessment - 09/20/15 0852    Subjective Pt arrives today with mother with no significant changes since last session. Mother has a cold.   Patient is accompained by: Family member  Mother   Patient Stated Goals "I would like him to improve his current status."   Currently in Pain? No/denies           [facility] Adult PT Treatment/Exercise - 09/20/15 1006    Exercises    Exercises Other Exercises   Other Exercises  PROM R wrist extension, R elbow extension, R forearm supination, R finger extension with bolster/pillow proximal to olecranon x10 min. AAROM R shoulder flexion, horizontal ABD/ADD with combined trunk rotation with elbow on pillowcase resting on table to decrease friction x20 each. AAROM R wrist extension x10 with verbal cues for isometric hold in extended/neutral position. Resisted elbow extension x20 with elbow propped on bolster/pillow using red TB x20. Resisted R internal rotation x15 red TB with elbow by side and verbal cue for pt to take R arm to stomach.R ER with no resistance x10 with tactile cue of pillowcase between R elbow and thorax and visual/verbal/tactile cue to hit PT hand lateral to R arm. AAROM R shoulder flexion with pt using L UE to facilitate R shoulder elevation to end range.            PT Education - 09/20/15 1013    Education provided Yes   Education Details HEP initiated for R UE. Please see pt instructions.   Person(s) Educated Patient;Parent(s)   Methods Explanation;Demonstration;Tactile cues;Verbal cues;Handout   Comprehension Verbalized understanding;Returned demonstration;Verbal cues required;Tactile cues required          PT Short Term Goals - 09/09/15 1230    PT SHORT TERM GOAL #1   Title  STGs = LTGs           PT Long Term Goals - 09/09/15 1231    PT LONG TERM GOAL #1   Title The patient will demonstrate initiation and completion of HEP with supervision from caregiver at home and at [facility] to maximize functional gains made in PT.   Time 4   Period Weeks   Status New   PT LONG TERM GOAL #2   Title The patient will move object from floor to countertop x5 mod I to demonstrate ability to reach for object on floor for safe performance of ADLs.   Time 4   Period Weeks   Status New   PT LONG TERM GOAL #3   Title The patient will ambulate 500' outdoors mod I over varying surfaces and surface heights  including grass, curbs, and inclines to demonstrate improved functional mobility with community ambulation.   Time 4   Period Weeks   Status New   PT LONG TERM GOAL #4   Title The patient will demonstrate initiation and completion with home stretching program for R UE requiring assist prn from caregiver to assist with decreasing tone.   Time 4   Period Weeks   Status New           Plan - 09/20/15 1018    Clinical Impression Statement Today's skilled session focused on initiation of HEP for R UE stretching/strengthening. The patient demonstrates improving R UE ROM though he still has inc tone during elbow extension, forearm supination, wrist and finger extension. Pt demonstrates ability to perform active elbow extension against resistance, trace activation with active assistive R wrist extension. Continue per POC.   Pt will benefit from skilled therapeutic intervention in order to improve on the following deficits Abnormal gait;Decreased balance;Decreased mobility;Decreased range of motion;Decreased strength;Difficulty walking;Increased muscle spasms;Impaired flexibility;Impaired tone;Impaired UE functional use;Postural dysfunction   Rehab Potential Good   Clinical Impairments Affecting Rehab Potential Good support system from family   PT Frequency 1x / week   PT Duration 4 weeks   PT Treatment/Interventions ADLs/Self Care Home Management;Moist Heat;DME Instruction;Gait training;Stair training;Functional mobility training;Therapeutic activities;Therapeutic exercise;Balance training;Neuromuscular re-education;Patient/family education;Orthotic Fit/Training;Manual techniques;Passive range of motion;Vestibular   PT Next Visit Plan Review HEP for R UE. R UE stretcing - elbow ext, supination, wrist/finger ext. AAROM for R wrist ext. Home set up for independent resistive R UE exercises.   Consulted and Agree with Plan of Care Patient        Problem List Patient Active Problem List   Diagnosis  Date Noted  . 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 Deafness, sensorineural 04/20/2012  . Buzzing in ear 04/20/2012  . Leaking percutaneous endoscopic gastrostomy (PEG) tube (HCC) 02/04/2012  . Traumatic brain injury (HCC) 10/12/2011  . Cerebral infarct (HCC) 10/12/2011  . Spastic hemiplegia affecting dominant side (HCC) 10/12/2011  . Dysphagia 10/12/2011  . Functioning G-tube 03/24/2011  . ASPIRATION PNEUMONIA 10/06/2010  . 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 HEMIPARESIS, DOMINANT SIDE, RIGHT 07/28/2010  . CEREBRAL HEMORRHAGE 07/28/2010  . PERSONAL HISTORY OF TRAUMATIC BRAIN INJURY 07/02/2010  . ALLERGIC RHINITIS CAUSE UNSPECIFIED 10/25/2008  . GASTROENTERITIS 08/09/2008  . CONTACT DERMATITIS&OTHER ECZEMA DUE DETERGENTS 08/18/2007  . HEPATITIS B, CHRONIC 06/28/2007    Celene Squibb, SPT 09/20/2015, 10:22 AM  [facility] [address] [place], [place], [address] Phone: [phone]   Fax:  [phone]  Name: GLENDON DUNWOODY MRN: [id number] Date of Birth: 01-29-1986

## 2015-09-20 NOTE — Patient Instructions (Signed)
1) PLACE PILLOW CASE UNDER RIGHT ARM AND HOLD RIGHT HAND WITH LEFT HAND. Slide the pillow case forward and back along the table top (while sitting)  2) Extension: ROM With Shoulder Flexed (Sitting)    Position (A) Patient: Hold left arm up at shoulder level, elbow bent. Helper: Support arm at elbow and wrist. Motion (B) -Straighten elbow slowly and gently. Repeat _5__ times. Do _2__ sessions per day. Variation:   Copyright  VHI. All rights reserved.  3) Flexion: AAROM With Fingers Extended ([place])    Position (A) Helper: Support left forearm. Hold fingers straight. Motion (B) - Bend wrist, moving hand in direction of palm. - Keep fingers straight. Repeat _10__ times. Repeat with other arm. Do _2__ sessions per day.   Copyright  VHI. All rights reserved.  4) External Rotation (Assistive)    Seated with elbow bent at right angle and held against side, slide arm on table surface in an outward arc. Repeat _10___ times. Do __2__ sessions per day.  Copyright  VHI. All rights reserved.

## 2015-09-27 ENCOUNTER — Ambulatory Visit: Payer: BC Managed Care – PPO | Admitting: Rehabilitative and Restorative Service Providers"

## 2015-09-27 DIAGNOSIS — R531 Weakness: Secondary | ICD-10-CM

## 2015-09-27 DIAGNOSIS — R269 Unspecified abnormalities of gait and mobility: Secondary | ICD-10-CM

## 2015-09-27 DIAGNOSIS — M6249 Contracture of muscle, multiple sites: Secondary | ICD-10-CM | POA: Diagnosis not present

## 2015-09-27 DIAGNOSIS — M6289 Other specified disorders of muscle: Secondary | ICD-10-CM

## 2015-09-27 NOTE — Patient Instructions (Addendum)
ELBOW: Extension - Supine    Rest arm on chest. Straighten elbow. _12__ reps per set, __2_ sets per day, _5__ days per week  Copyright  VHI. All rights reserved.    Have Paul Kane help wipe the countertop with his right hand. He can use his left hand to help position his right.

## 2015-09-27 NOTE — Therapy (Signed)
[facility] [address] [place], [place], [address] Phone: [phone]   Fax:  [phone]  Physical Therapy Treatment  Patient Details  Name: Paul Kane MRN: [id number] Date of Birth: [birth year] Referring Provider: Daneil Dan  Encounter Date: 09/27/2015      PT End of Session - 09/27/15 1335    Visit Number 3   Number of Visits 5   Date for PT Re-Evaluation 10/09/15   Authorization Type [employer] PPO   PT Start Time 1230   PT Stop Time 1315   PT Time Calculation (min) 45 min   Equipment Utilized During Treatment Gait belt   Activity Tolerance Patient tolerated treatment well   Behavior During Therapy [facility] for tasks assessed/performed      Past Medical History  Diagnosis Date  . Chronic hepatitis B (HCC)   . Traumatic brain injury (HCC)   . Intracranial shunt   . Stroke (HCC)   . 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HCC)   . Paralysis (HCC)   . Fixed pupils   . Hypertension   . Trouble swallowing   . Visual disturbance   . Weakness   . Intracranial injury of other and unspecified nature, without mention of open intracranial wound, unspecified state of consciousness   . 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HCC)   . Spastic hemiplegia affecting dominant side [facility])     Past Surgical History  Procedure Laterality Date  . Cramiectomy    . Ventriculoperitoneal shunt    . Cranioplasty    . Removal of gastrostomy tube      There were no vitals filed for this visit.  Visit Diagnosis:  Muscle hypertonicity  Generalized weakness  Abnormality of gait      Subjective Assessment - 09/27/15 1648    Subjective Pt arrives today with his sister and they report no significant changes since last session. Says he has been stretching and is still going to the gym.   Patient is accompained by: Family member  Sister   Patient Stated Goals "I would like him to improve his current status."    Currently in Pain? No/denies          [facility] Adult PT Treatment/Exercise - 09/27/15 1322    Therapeutic Activites    Therapeutic Activities --   Neuro Re-ed    Neuro Re-ed Details  Pt performed active elbow extension x5 min using towel roll in R hand in various planes to facilitate inc ROM/activation of R UE into extension. Performed active wrist extension with tactile cue of tapping wrist extensor bundle to facilitate inc activation. Pt able to demonstrate slight activation of wrist extensors. Standing shoulder flexion/elbow extension x10 with card positioned above head on door frame as external cue to facilitate inc trunk/elbow ext with reaching movement.   Exercises   Exercises Other Exercises   Other Exercises  PROM elbow extension x15 in seated, x10 with patient elbow propped on pillow while seated at table. PROM wrist extension while seated at mat table x15. Attempted closed chain elbow ext/wrist/finger ext standing and leaning over red physioball with hands on mat table to facilitate WB in closed kinetic chain. Pt unable to achieve full wrist/finger extension for appropriate performance. Attempted press-up seated at edge of mat table x3 with verbal and tactile cues for hand placement and to facilitate elbow extension with limited results due to pt comprehension of task. Performed supine elbow ext with shoulder flexed to 90 x12 with positive results  for active elbow extension. Performed supine elbow extension using red TB with shoulder abd to 45 deg to facilitate resisted tricep ext for inc activation. Active assisted wrist ext with PT assist to place wrist into neutral/slightly extended position with verbal instructions to maintain position for inc activation of wrist extensors. Attempted active elbow extension utilizing pool noodle positioned behind patient as fulcrum with limited results due to dec activation/limited comprehension from pt.           PT Education - 09/27/15 1322     Education provided Yes   Education Details Added to HEP - please see pt instructions.   Person(s) Educated Patient;Other (comment)  Sister   Methods Explanation;Demonstration;Tactile cues;Verbal cues;Handout   Comprehension Verbalized understanding;Returned demonstration;Verbal cues required;Tactile cues required;Need further instruction          PT Short Term Goals - 09/09/15 1230    PT SHORT TERM GOAL #1   Title STGs = LTGs           PT Long Term Goals - 09/09/15 1231    PT LONG TERM GOAL #1   Title The patient will demonstrate initiation and completion of HEP with supervision from caregiver at home and at [facility] to maximize functional gains made in PT.   Time 4   Period Weeks   Status New   PT LONG TERM GOAL #2   Title The patient will move object from floor to countertop x5 mod I to demonstrate ability to reach for object on floor for safe performance of ADLs.   Time 4   Period Weeks   Status New   PT LONG TERM GOAL #3   Title The patient will ambulate 500' outdoors mod I over varying surfaces and surface heights including grass, curbs, and inclines to demonstrate improved functional mobility with community ambulation.   Time 4   Period Weeks   Status New   PT LONG TERM GOAL #4   Title The patient will demonstrate initiation and completion with home stretching program for R UE requiring assist prn from caregiver to assist with decreasing tone.   Time 4   Period Weeks   Status New           Plan - 09/27/15 1338    Clinical Impression Statement Today's skilled session focused on progression of HEP for R UE stretching/strengthening. Pt demonstrates ability to perform active elbow extension and inc ROM with passive elbow extension. Pt able to perform some active wristextension during today's session, improved performance with tactile cue of tapping to facilitate wrist extensors. Decreased ability to perform closed chain exercises with R UE due to inc tone in wrist and  fingers. Pt making slow, steady progress towards goals.   Pt will benefit from skilled therapeutic intervention in order to improve on the following deficits Abnormal gait;Decreased balance;Decreased mobility;Decreased range of motion;Decreased strength;Difficulty walking;Increased muscle spasms;Impaired flexibility;Impaired tone;Impaired UE functional use;Postural dysfunction   Rehab Potential Good   Clinical Impairments Affecting Rehab Potential Good support system from family   PT Frequency 1x / week   PT Duration 4 weeks   PT Treatment/Interventions ADLs/Self Care Home Management;Moist Heat;DME Instruction;Gait training;Stair training;Functional mobility training;Therapeutic activities;Therapeutic exercise;Balance training;Neuromuscular re-education;Patient/family education;Orthotic Fit/Training;Manual techniques;Passive range of motion;Vestibular   PT Next Visit Plan Review HEP. R UE activities. Ambulation with R UE activities. bending to pick up objects with reaching/active elbow ext for placement.   Consulted and Agree with Plan of Care Patient;Family member/caregiver   Family Member Consulted Sister  Problem List Patient Active Problem List   Diagnosis Date Noted  . 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 Deafness, sensorineural 04/20/2012  . Buzzing in ear 04/20/2012  . Leaking percutaneous endoscopic gastrostomy (PEG) tube (HCC) 02/04/2012  . Traumatic brain injury (HCC) 10/12/2011  . Cerebral infarct (HCC) 10/12/2011  . Spastic hemiplegia affecting dominant side (HCC) 10/12/2011  . Dysphagia 10/12/2011  . Functioning G-tube 03/24/2011  . ASPIRATION PNEUMONIA 10/06/2010  . 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 HEMIPARESIS, DOMINANT SIDE, RIGHT 07/28/2010  . CEREBRAL HEMORRHAGE 07/28/2010  . PERSONAL HISTORY OF TRAUMATIC BRAIN INJURY 07/02/2010  . ALLERGIC RHINITIS CAUSE UNSPECIFIED 10/25/2008  . GASTROENTERITIS 08/09/2008  . CONTACT DERMATITIS&OTHER ECZEMA DUE  DETERGENTS 08/18/2007  . HEPATITIS B, CHRONIC 06/28/2007    Celene SquibbEric Aisha Greenberger, SPT 09/27/2015, 4:49 PM  [place] [facility] [address] [place], [place], [address] Phone: [phone]   Fax:  [phone]  Name: Bethann HumbleHayri E Kane MRN: [id number] Date of Birth: [birth year]

## 2015-10-04 ENCOUNTER — Ambulatory Visit: Payer: BC Managed Care – PPO | Admitting: Rehabilitative and Restorative Service Providers"

## 2015-10-04 DIAGNOSIS — R269 Unspecified abnormalities of gait and mobility: Secondary | ICD-10-CM

## 2015-10-04 DIAGNOSIS — R531 Weakness: Secondary | ICD-10-CM

## 2015-10-04 DIAGNOSIS — M6249 Contracture of muscle, multiple sites: Secondary | ICD-10-CM | POA: Diagnosis not present

## 2015-10-04 DIAGNOSIS — M6289 Other specified disorders of muscle: Secondary | ICD-10-CM

## 2015-10-04 NOTE — Therapy (Signed)
[facility] [address] [place], [place], [address] Phone: [phone]   Fax:  [phone]  Physical Therapy Treatment  Patient Details  Name: Paul Kane MRN: [id number] Date of Birth: 11-23-1985 Referring Provider: Daneil Dan  Encounter Date: 10/04/2015      PT End of Session - 10/04/15 1108    Visit Number 4   Number of Visits 5   Date for PT Re-Evaluation 10/09/15   Authorization Type [employer] PPO   PT Start Time 1105   PT Stop Time 1148   PT Time Calculation (min) 43 min   Activity Tolerance Patient tolerated treatment well   Behavior During Therapy [facility] for tasks assessed/performed      Past Medical History  Diagnosis Date  . Chronic hepatitis B (HCC)   . Traumatic brain injury (HCC)   . Intracranial shunt   . Stroke (HCC)   . 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HCC)   . Paralysis (HCC)   . Fixed pupils   . Hypertension   . Trouble swallowing   . Visual disturbance   . Weakness   . Intracranial injury of other and unspecified nature, without mention of open intracranial wound, unspecified state of consciousness   . 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HCC)   . Spastic hemiplegia affecting dominant side [facility])     Past Surgical History  Procedure Laterality Date  . Cramiectomy    . Ventriculoperitoneal shunt    . Cranioplasty    . Removal of gastrostomy tube      There were no vitals filed for this visit.  Visit Diagnosis:  Muscle hypertonicity  Generalized weakness  Abnormality of gait      Subjective Assessment - 10/04/15 1103    Subjective Pt arrives today stating with his mother, aunt, and uncle and states that he is "sick." His mother reports this is allergies. Has been semi-compliant with HEP per mother.   Patient is accompained by: Family member  Mother, Celine Ahr, Uncle   Patient Stated Goals "I would like him to improve his current status."   Currently in Pain?  No/denies          [facility] Adult PT Treatment/Exercise - 10/04/15 1159    Self-Care   Self-Care Other Self-Care Comments   Other Self-Care Comments  Reviewed JAS splint with pt. Instructed mother and pt to not move to maximal level as the patient's arm ceases supination while the brace continues to move which hinders circulation.   Therapeutic Activites    Therapeutic Activities Other Therapeutic Activities   Other Therapeutic Activities Simulated making bed in tall kneeling on red mat at ramp in clinic with min guard for pt safety. Pt demonstrates R elbow prop and moves to prone on surface in to complete. Pt then performed tall kneeling ambulation x5' to mat table for UE assist in order to simulate return to standing.   Exercises   Exercises Other Exercises   Other Exercises  PROM R wrist extension, R elbow extension, R forearm supination 3x30 sec each. Pt reports pain with end range elbow extension. AAROM R wrist extension x8. R elbow extension while pt in supine 2x10 with visual cue of PT hand for pt to reach for to facilitate inc elbow ext. Mod quadruped with pt with R elbow prop on mat table reaching for cones with L UE 2x8. Standing reach with LE UE while propped on R elbow at edge of wall to facilitate inc R UE WB  and proximal stability.           PT Education - 10/04/15 1138    Education provided Yes   Education Details HEP updated- please see pt instructions. Pt and mother instructed to perform ROM exercises after exercising for inc blood flow and tissue flexibility. Reviewed JAS splint with pt and mother and instructed pt about appropriate wear and range.   Person(s) Educated Patient;Parent(s)   Methods Explanation;Demonstration;Tactile cues;Verbal cues   Comprehension Verbalized understanding;Returned demonstration;Verbal cues required;Tactile cues required          PT Short Term Goals - 09/09/15 1230    PT SHORT TERM GOAL #1   Title STGs = LTGs           PT Long  Term Goals - 09/09/15 1231    PT LONG TERM GOAL #1   Title The patient will demonstrate initiation and completion of HEP with supervision from caregiver at home and at [facility] to maximize functional gains made in PT.   Time 4   Period Weeks   Status New   PT LONG TERM GOAL #2   Title The patient will move object from floor to countertop x5 mod I to demonstrate ability to reach for object on floor for safe performance of ADLs.   Time 4   Period Weeks   Status New   PT LONG TERM GOAL #3   Title The patient will ambulate 500' outdoors mod I over varying surfaces and surface heights including grass, curbs, and inclines to demonstrate improved functional mobility with community ambulation.   Time 4   Period Weeks   Status New   PT LONG TERM GOAL #4   Title The patient will demonstrate initiation and completion with home stretching program for R UE requiring assist prn from caregiver to assist with decreasing tone.   Time 4   Period Weeks   Status New           Plan - 10/04/15 1214    Clinical Impression Statement Pt continues to demonstrate inc tone in R UE - passive stretching and review of JAS splint to continue to address this. Pain at olecranon with passive end range elbow extension during today's session. Exercises for peri scapular strengthing and proximal shoulder stability implemented today to simulate pt making bed at home.   Pt will benefit from skilled therapeutic intervention in order to improve on the following deficits Abnormal gait;Decreased balance;Decreased mobility;Decreased range of motion;Decreased strength;Difficulty walking;Increased muscle spasms;Impaired flexibility;Impaired tone;Impaired UE functional use;Postural dysfunction   Rehab Potential Good   Clinical Impairments Affecting Rehab Potential Good support system from family   PT Frequency 1x / week   PT Duration 4 weeks   PT Treatment/Interventions ADLs/Self Care Home Management;Moist Heat;DME Instruction;Gait  training;Stair training;Functional mobility training;Therapeutic activities;Therapeutic exercise;Balance training;Neuromuscular re-education;Patient/family education;Orthotic Fit/Training;Manual techniques;Passive range of motion;Vestibular   PT Next Visit Plan CHECK GOALS. Review HEP. R UE PROM/AROM, R elbow prop for R UE proximal stability   Consulted and Agree with Plan of Care Patient;Family member/caregiver   Family Member Consulted Mother, Aunt, Uncle        Problem List Patient Active Problem List   Diagnosis Date Noted  . 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 Deafness, sensorineural 04/20/2012  . Buzzing in ear 04/20/2012  . Leaking percutaneous endoscopic gastrostomy (PEG) tube (HCC) 02/04/2012  . Traumatic brain injury (HCC) 10/12/2011  . Cerebral infarct (HCC) 10/12/2011  . Spastic hemiplegia affecting dominant side (HCC) 10/12/2011  . Dysphagia 10/12/2011  .  Functioning G-tube 03/24/2011  . ASPIRATION PNEUMONIA 10/06/2010  . 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 HEMIPARESIS, DOMINANT SIDE, RIGHT 07/28/2010  . CEREBRAL HEMORRHAGE 07/28/2010  . PERSONAL HISTORY OF TRAUMATIC BRAIN INJURY 07/02/2010  . ALLERGIC RHINITIS CAUSE UNSPECIFIED 10/25/2008  . GASTROENTERITIS 08/09/2008  . CONTACT DERMATITIS&OTHER ECZEMA DUE DETERGENTS 08/18/2007  . HEPATITIS B, CHRONIC 06/28/2007    Celene SquibbEric Creighton Longley, SPT 10/04/2015, 12:20 PM  [place] [facility] [address] [place], [place], [address] Phone: [phone]   Fax:  [phone]  Name: Bethann HumbleHayri E Madore MRN: [id number] Date of Birth: 1985-10-04

## 2015-10-04 NOTE — Patient Instructions (Signed)
1) LEAN ON RIGHT ELBOW AGAINST WALL Reach overhead with the left hand. Repeat 10 times as tolerated.

## 2015-10-11 ENCOUNTER — Ambulatory Visit: Payer: BC Managed Care – PPO | Admitting: Rehabilitative and Restorative Service Providers"

## 2015-10-11 DIAGNOSIS — R531 Weakness: Secondary | ICD-10-CM

## 2015-10-11 DIAGNOSIS — M6249 Contracture of muscle, multiple sites: Secondary | ICD-10-CM | POA: Diagnosis not present

## 2015-10-11 NOTE — Therapy (Signed)
[facility] [address] [place], [place], [address] Phone: [phone]   Fax:  [phone]  Physical Therapy Treatment  Patient Details  Name: Paul Kane MRN: [id number] Date of Birth: May 11, 1986 Referring Provider: Daneil Dan  Encounter Date: 10/11/2015      PT End of Session - 10/11/15 1521    Visit Number 5   Number of Visits 5   Date for PT Re-Evaluation --   Authorization Type [employer] PPO   PT Start Time 1015   PT Stop Time 1105   PT Time Calculation (min) 50 min   Activity Tolerance Patient tolerated treatment well   Behavior During Therapy [facility] for tasks assessed/performed      Past Medical History  Diagnosis Date  . Chronic hepatitis B (HCC)   . Traumatic brain injury (HCC)   . Intracranial shunt   . Stroke (HCC)   . 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HCC)   . Paralysis (HCC)   . Fixed pupils   . Hypertension   . Trouble swallowing   . Visual disturbance   . Weakness   . Intracranial injury of other and unspecified nature, without mention of open intracranial wound, unspecified state of consciousness   . 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HCC)   . Spastic hemiplegia affecting dominant side [facility])     Past Surgical History  Procedure Laterality Date  . Cramiectomy    . Ventriculoperitoneal shunt    . Cranioplasty    . Removal of gastrostomy tube      There were no vitals filed for this visit.  Visit Diagnosis:  Generalized weakness              [facility] Adult PT Treatment/Exercise - 10/11/15 1522    Self-Care   Self-Care Other Self-Care Comments   Other Self-Care Comments  Patient's mother and PT discussed home routine and HEP.   Exercises   Exercises Wrist;Hand   Hand Exercises   MCPJ Flexion PROM;AROM;Right;10 reps   MCPJ Extension PROM;AROM;Strengthening;Right;10 reps   PIPJ Flexion PROM;AROM;Strengthening;Right;10 reps   PIPJ Extension  PROM;AROM;Strengthening;Right;10 reps   DIPJ Flexion PROM;AROM;Strengthening;Right;10 reps   DIPJ Extension PROM;AROM;Strengthening;Right;10 reps   Other Hand Exercises seated functional activities or picking up cones, reaching for blocks and wrist extension against wall with L UE to maintain correct positioning   Wrist Exercises   Forearm Supination AROM;PROM;Self ROM;Right;10 reps  seated with tactile cues   Forearm Supination Limitations [place] needs guidance for correct technique   Wrist Extension PROM;AROM;Strengthening;Right;10 reps  working on isolated motor control with cues   Wrist Extension Limitations limited ROM, however patient can initiate through available range                  PT Short Term Goals - 09/09/15 1230    PT SHORT TERM GOAL #1   Title STGs = LTGs           PT Long Term Goals - 10/11/15 1016    PT LONG TERM GOAL #1   Title The patient will demonstrate initiation and completion of HEP with supervision from caregiver at home and at [facility] to maximize functional gains made in PT.   Time 4   Period Weeks   Status On-going   PT LONG TERM GOAL #2   Title The patient will move object from floor to countertop x5 mod I to demonstrate ability to reach for object on floor for safe performance of ADLs.  Time 4   Period Weeks   Status On-going   PT LONG TERM GOAL #3   Title The patient will ambulate 500' outdoors mod I over varying surfaces and surface heights including grass, curbs, and inclines to demonstrate improved functional mobility with community ambulation.   Time 4   Period Weeks   Status Achieved   PT LONG TERM GOAL #4   Title The patient will demonstrate initiation and completion with home stretching program for R UE requiring assist prn from caregiver to assist with decreasing tone.   Time 4   Period Weeks   Status On-going               Plan - 10/11/15 1527    Clinical Impression Statement The patient is ambulating outdoors  independently per parent's report.  He demonstrates isolated trace motion at each finger joint.  PT to further develop HEP for isolated motor control versus functional reaching to get patient to focus on each muscle group.  Paul Kane demonstrated exceptional ability today to focus and change movement patten with tactile cues.   PT Next Visit Plan RENEW and Check goals.  review isolated R hand function and provide written HEP.  ? stacking blocks?   Consulted and Agree with Plan of Care Patient;Family member/caregiver   Family Member Consulted mother        Problem List Patient Active Problem List   Diagnosis Date Noted  . 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 Deafness, sensorineural 04/20/2012  . Buzzing in ear 04/20/2012  . Leaking percutaneous endoscopic gastrostomy (PEG) tube (HCC) 02/04/2012  . Traumatic brain injury (HCC) 10/12/2011  . Cerebral infarct (HCC) 10/12/2011  . Spastic hemiplegia affecting dominant side (HCC) 10/12/2011  . Dysphagia 10/12/2011  . Functioning G-tube 03/24/2011  . ASPIRATION PNEUMONIA 10/06/2010  . 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 HEMIPARESIS, DOMINANT SIDE, RIGHT 07/28/2010  . CEREBRAL HEMORRHAGE 07/28/2010  . PERSONAL HISTORY OF TRAUMATIC BRAIN INJURY 07/02/2010  . ALLERGIC RHINITIS CAUSE UNSPECIFIED 10/25/2008  . GASTROENTERITIS 08/09/2008  . CONTACT DERMATITIS&OTHER ECZEMA DUE DETERGENTS 08/18/2007  . HEPATITIS B, CHRONIC 06/28/2007    Randi Poullard, PT 10/11/2015, 3:29 PM  [facility] [address] [place], [place], [address] Phone: [phone]   Fax:  [phone]  Name: Paul Kane MRN: [id number] Date of Birth: [birth year]

## 2015-10-22 ENCOUNTER — Ambulatory Visit
Payer: BC Managed Care – PPO | Attending: Physical Medicine & Rehabilitation | Admitting: Rehabilitative and Restorative Service Providers"

## 2015-10-22 DIAGNOSIS — R531 Weakness: Secondary | ICD-10-CM | POA: Diagnosis present

## 2015-10-22 DIAGNOSIS — R2689 Other abnormalities of gait and mobility: Secondary | ICD-10-CM | POA: Insufficient documentation

## 2015-10-22 DIAGNOSIS — M6281 Muscle weakness (generalized): Secondary | ICD-10-CM | POA: Insufficient documentation

## 2015-10-22 NOTE — Therapy (Signed)
[facility] [address] [place], [place], [address] Phone: [phone]   Fax:  [phone]  Physical Therapy Treatment  Patient Details  Name: Paul Kane MRN: [id number] Date of Birth: 08/18/1985 Referring Provider: Timothy Lasso  Encounter Date: 10/22/2015      PT End of Session - 10/22/15 1544    Visit Number 6   Number of Visits [place] PPO   PT Start Time 5364   PT Stop Time 1240   PT Time Calculation (min) 46 min   Activity Tolerance Patient tolerated treatment well   Behavior During Therapy [facility] for tasks assessed/performed      Past Medical History  Diagnosis Date  . Chronic hepatitis B ([place])   . Traumatic brain injury ([place])   . Intracranial shunt   . Stroke ([place])   . 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 Paralysis ([place])   . Fixed pupils   . Hypertension   . Trouble swallowing   . Visual disturbance   . Weakness   . Intracranial injury of other and unspecified nature, without mention of open intracranial wound, unspecified state of consciousness   . 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[place])   . Spastic hemiplegia affecting dominant side [facility])     Past Surgical History  Procedure Laterality Date  . Cramiectomy    . Ventriculoperitoneal shunt    . Cranioplasty    . Removal of gastrostomy tube      There were no vitals filed for this visit.  Visit Diagnosis:  Generalized weakness      Subjective Assessment - 10/22/15 1538    Subjective Patient's extended family still visiting and helping with the exercises. They are scheduled to leave this Thursday.  Patient is doing HEP with family assistance.    Patient Stated Goals "I would like him to improve his current status."   Currently in Pain? No/denies           [facility] Adult PT Treatment/Exercise - 10/22/15 1540    Neuro Re-ed    Neuro Re-ed Details  Reaching to the floor to pick up items and  place on countertop.  Used R UE with some tactile cues to extend through fingers to pick up cone.  Patient got down to his knees and returned to standing with support of countertop with supervision.     Exercises   Exercises Other Exercises   Other Exercises  Performed standing tricep extension using pool noodle and reaching behind with shoulder and elbow extension, Reaching targets with pool noodle, tricep extension with manual resistance in sitting, bicep curl seated with resistance.    Elbow Exercises   Forearm Supination AROM;PROM;Self ROM;Right;10 reps  seated with tactile cues   Forearm Supination Limitations Paul Kane needs cues for correct technique   Wrist Extension PROM;AROM;Strengthening;Right;10 reps  working on isolated motor control with cues   Wrist Extension Limitations Patient can initiate motion with tapping of extensor muscles.    Hand Exercises   Other Hand Exercises seated functional activities or picking up cones, reaching for blocks and wrist extension against wall with L UE to maintain correct positioning                  PT Short Term Goals - 09/09/15 1230    PT SHORT TERM GOAL #1   Title STGs = LTGs           PT Long Term Goals -  10/22/15 1545    PT LONG TERM GOAL #1   Title The patient will demonstrate initiation and completion of HEP with supervision from caregiver at home and at [facility] to maximize functional gains made in PT.   Time 4   Period Weeks   Status Achieved   PT LONG TERM GOAL #2   Title The patient will move object from floor to countertop x5 mod I to demonstrate ability to reach for object on floor for safe performance of ADLs.   Time 4   Period Weeks   Status Achieved   PT LONG TERM GOAL #3   Title The patient will ambulate 500' outdoors mod I over varying surfaces and surface heights including grass, curbs, and inclines to demonstrate improved functional mobility with community ambulation.   Time 4   Period Weeks   Status Achieved    PT LONG TERM GOAL #4   Title The patient will demonstrate initiation and completion with home stretching program for R UE requiring assist prn from caregiver to assist with decreasing tone.   Time 4   Period Weeks   Status Achieved   PT LONG TERM GOAL #5   Title New Goal:  Patient and caregiver will demonstrate progression of HEP for more isolated wrist control and UE motion.   Baseline Target date 11/20/2015   Time 4   Period Weeks               Plan - 10/22/15 1546    Clinical Impression Statement Paul Kane met original LTGs.  After recent botox injection, he is demonstrating some minimal isolated wrist motion.  When attempting functional tasks, the patient is using tenodesis type grip and compensation with biceps/triceps.  When resting/supporting R UE, the patient can isolate minimal wrist extension with tapping and tactile cuing.  PT to continue developing HEP  x 4 more visits in order to optimize current motion s/p botox injection.  PT discussed with patient's mother/caregiver that current focus will be to capitalize on any isolated motion for HEP purposes as this motion may not immediately carry over to function due to movement patterns currently developed.    Pt will benefit from skilled therapeutic intervention in order to improve on the following deficits Abnormal gait;Decreased balance;Decreased mobility;Decreased range of motion;Decreased strength;Difficulty walking;Increased muscle spasms;Impaired flexibility;Impaired tone;Impaired UE functional use;Postural dysfunction   Rehab Potential Good   PT Frequency --  4 more visits   PT Treatment/Interventions ADLs/Self Care Home Management;Moist Heat;DME Instruction;Gait training;Stair training;Functional mobility training;Therapeutic activities;Therapeutic exercise;Balance training;Neuromuscular re-education;Patient/family education;Orthotic Fit/Training;Manual techniques;Passive range of motion;Vestibular   PT Next Visit Plan HEP for  isolating wrist extension, any isolated motion in hand/wrist for HEP for patient to practice s/p d/c from therapy.   Consulted and Agree with Plan of Care Patient;Family member/caregiver   Family Member Consulted mother        Problem List Patient Active Problem List   Diagnosis Date Noted  . 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 Deafness, sensorineural 04/20/2012  . Buzzing in ear 04/20/2012  . Leaking percutaneous endoscopic gastrostomy (PEG) tube ([place]) 02/04/2012  . Traumatic brain injury ([place]) 10/12/2011  . Cerebral infarct ([place]) 10/12/2011  . Spastic hemiplegia affecting dominant side ([place]) 10/12/2011  . Dysphagia 10/12/2011  . Functioning G-tube 03/24/2011  . ASPIRATION PNEUMONIA 10/06/2010  . 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 HEMIPARESIS, DOMINANT SIDE, RIGHT 07/28/2010  . CEREBRAL HEMORRHAGE 07/28/2010  . PERSONAL HISTORY OF TRAUMATIC BRAIN INJURY 07/02/2010  . ALLERGIC RHINITIS CAUSE UNSPECIFIED 10/25/2008  .  GASTROENTERITIS 08/09/2008  . CONTACT DERMATITIS&OTHER ECZEMA DUE DETERGENTS 08/18/2007  . HEPATITIS B, CHRONIC 06/28/2007    Myrtis Maille, PT 10/22/2015, 3:49 PM  [facility] [address] [place], [place], [address] Phone: [phone]   Fax:  [phone]  Name: Paul Kane MRN: [id number] Date of Birth: 1985/12/30

## 2015-10-25 ENCOUNTER — Ambulatory Visit: Payer: BC Managed Care – PPO | Admitting: Rehabilitative and Restorative Service Providers"

## 2015-10-25 NOTE — Addendum Note (Signed)
Addended by: Margretta DittyWEAVER, Joory Gough M on: 10/25/2015 08:28 AM   Modules accepted: Orders

## 2015-10-30 ENCOUNTER — Ambulatory Visit: Payer: BC Managed Care – PPO | Admitting: Rehabilitative and Restorative Service Providers"

## 2015-10-30 DIAGNOSIS — M6281 Muscle weakness (generalized): Secondary | ICD-10-CM | POA: Diagnosis not present

## 2015-10-30 DIAGNOSIS — R531 Weakness: Secondary | ICD-10-CM

## 2015-10-30 DIAGNOSIS — R2689 Other abnormalities of gait and mobility: Secondary | ICD-10-CM

## 2015-10-30 NOTE — Patient Instructions (Addendum)
AROM: Wrist Extension    With right palm down, bend wrist up. Repeat _10___ times per set. Do __2__ sets per session. Do __1-2__ sessions per day.  Copyright  VHI. All rights reserved.   DIP Flexion (Isometric Strengthening of Profundus)    Curl fingers, keeping wrist straight. Resist with other hand without actual movement. Hold __3__ seconds. Relax. Repeat _10___ times. Do __2__ sessions per day. Activity: Without using thumb, curl fingertips around a pen while trying to pull it out with other hand.*  Copyright  VHI. All rights reserved.   Extension: Facilitated    Position Helper: Stabilize left wrist. Motion - Helper folds fingers into a snug fist. (A) -Helper releases fingers and cues patient to straighten fingers immediately. (B) Repeat sequence _10__ times. Repeat with other hand. Do __2_ sessions per day. Variation: Perform with wrist bent in direction of palm. Cue patient to straighten fingers of other hand at the same time.   Copyright  VHI. All rights reserved.  AROM: Forearm Pronation / Supination    With right arm in handshake position, slowly rotate palm down until stretch is felt. Relax. Then rotate palm up until stretch is felt. Repeat ___10_ times per set. Do _2___ sets per session. Do __1-2__ sessions per day.  Copyright  VHI. All rights reserved.

## 2015-10-30 NOTE — Therapy (Signed)
[facility] [address] [place], [place], [address] Phone: [phone]   Fax:  [phone]  Physical Therapy Treatment  Patient Details  Name: Paul Kane MRN: [id number] Date of Birth: 09-Jan-1986 Referring Provider: Daneil Dan  Encounter Date: 10/30/2015      PT End of Session - 10/30/15 1339    Visit Number 7   Number of Visits 9   Authorization Type [employer] PPO   PT Start Time 1015   PT Stop Time 1100   PT Time Calculation (min) 45 min   Activity Tolerance Patient tolerated treatment well   Behavior During Therapy [facility] for tasks assessed/performed      Past Medical History  Diagnosis Date  . Chronic hepatitis B (HCC)   . Traumatic brain injury (HCC)   . Intracranial shunt   . Stroke (HCC)   . 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HCC)   . Paralysis (HCC)   . Fixed pupils   . Hypertension   . Trouble swallowing   . Visual disturbance   . Weakness   . Intracranial injury of other and unspecified nature, without mention of open intracranial wound, unspecified state of consciousness   . 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HCC)   . Spastic hemiplegia affecting dominant side [facility])     Past Surgical History  Procedure Laterality Date  . Cramiectomy    . Ventriculoperitoneal shunt    . Cranioplasty    . Removal of gastrostomy tube      There were no vitals filed for this visit.      Subjective Assessment - 10/30/15 1020    Subjective The patient reports he is doing HEP with mom.   Patient is accompained by: --  mother   Patient Stated Goals "I would like him to improve his current status."   Currently in Pain? No/denies             [facility] Adult PT Treatment/Exercise - 10/30/15 1020    Exercises   Exercises Hand;Wrist;Elbow   Other Exercises  Focused on HEP for isolated wrist and finger control at home.  Provided HEP working on forearm supination, wrist flexion/extension,  finger flexion/extension, tricep press with yellow theraand, scapular retraction seated, and isolated finger control.  PT to f/u in one week to check progress with HEP.  Also performed gross UE movements using towel and wash cloth on table and along wall.           PT Education - 10/30/15 1338    Education provided Yes   Education Details HEP: UE isolated motor exercises   Person(s) Educated Patient;Parent(s)   Methods Explanation;Demonstration;Handout   Comprehension Returned demonstration;Verbalized understanding          PT Short Term Goals - 09/09/15 1230    PT SHORT TERM GOAL #1   Title STGs = LTGs           PT Long Term Goals - 10/22/15 1545    PT LONG TERM GOAL #1   Title The patient will demonstrate initiation and completion of HEP with supervision from caregiver at home and at [facility] to maximize functional gains made in PT.   Time 4   Period Weeks   Status Achieved   PT LONG TERM GOAL #2   Title The patient will move object from floor to countertop x5 mod I to demonstrate ability to reach for object on floor for safe performance of ADLs.   Time 4  Period Weeks   Status Achieved   PT LONG TERM GOAL #3   Title The patient will ambulate 500' outdoors mod I over varying surfaces and surface heights including grass, curbs, and inclines to demonstrate improved functional mobility with community ambulation.   Time 4   Period Weeks   Status Achieved   PT LONG TERM GOAL #4   Title The patient will demonstrate initiation and completion with home stretching program for R UE requiring assist prn from caregiver to assist with decreasing tone.   Time 4   Period Weeks   Status Achieved   PT LONG TERM GOAL #5   Title New Goal:  Patient and caregiver will demonstrate progression of HEP for more isolated wrist control and UE motion.   Baseline Target date 11/20/2015   Time 4   Period Weeks               Plan - 10/30/15 1340    Clinical Impression Statement The  patient and his mother can demo isolated hand exercises.  PT to f/u for one further visit in order to check HEP and discharge.   Rehab Potential Good   PT Frequency 2x / week  4 more visits   PT Duration 2 weeks   PT Treatment/Interventions ADLs/Self Care Home Management;Moist Heat;DME Instruction;Gait training;Stair training;Functional mobility training;Therapeutic activities;Therapeutic exercise;Balance training;Neuromuscular re-education;Patient/family education;Orthotic Fit/Training;Manual techniques;Passive range of motion;Vestibular   PT Next Visit Plan Check HEP and discharge   Consulted and Agree with Plan of Care Patient;Family member/caregiver   Family Member Consulted mother      Patient will benefit from skilled therapeutic intervention in order to improve the following deficits and impairments:  Abnormal gait, Decreased balance, Decreased mobility, Decreased range of motion, Decreased strength, Difficulty walking, Increased muscle spasms, Impaired flexibility, Impaired tone, Impaired UE functional use, Postural dysfunction  Visit Diagnosis: Other abnormalities of gait and mobility  Generalized weakness     Problem List Patient Active Problem List   Diagnosis Date Noted  . 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 Deafness, sensorineural 04/20/2012  . Buzzing in ear 04/20/2012  . Leaking percutaneous endoscopic gastrostomy (PEG) tube (HCC) 02/04/2012  . Traumatic brain injury (HCC) 10/12/2011  . Cerebral infarct (HCC) 10/12/2011  . Spastic hemiplegia affecting dominant side (HCC) 10/12/2011  . Dysphagia 10/12/2011  . Functioning G-tube 03/24/2011  . ASPIRATION PNEUMONIA 10/06/2010  . 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 HEMIPARESIS, DOMINANT SIDE, RIGHT 07/28/2010  . CEREBRAL HEMORRHAGE 07/28/2010  . PERSONAL HISTORY OF TRAUMATIC BRAIN INJURY 07/02/2010  . ALLERGIC RHINITIS CAUSE UNSPECIFIED 10/25/2008  . GASTROENTERITIS 08/09/2008  . CONTACT DERMATITIS&OTHER  ECZEMA DUE DETERGENTS 08/18/2007  . HEPATITIS B, CHRONIC 06/28/2007    Myrene Bougher, PT 10/30/2015, 1:43 PM  [facility] [address] [place], [place], [address] Phone: [phone]   Fax:  [phone]  Name: Paul Kane MRN: [id number] Date of Birth: 01-03-1986

## 2015-11-02 ENCOUNTER — Encounter (HOSPITAL_COMMUNITY): Payer: Self-pay | Admitting: Emergency Medicine

## 2015-11-02 ENCOUNTER — Emergency Department (HOSPITAL_COMMUNITY): Payer: BC Managed Care – PPO

## 2015-11-02 ENCOUNTER — Emergency Department (HOSPITAL_COMMUNITY)
Admission: EM | Admit: 2015-11-02 | Discharge: 2015-11-03 | Disposition: A | Discharge status: Home / Self Care | Payer: BC Managed Care – PPO | Attending: Emergency Medicine | Admitting: Emergency Medicine

## 2015-11-02 DIAGNOSIS — Z8619 Personal history of other infectious and parasitic diseases: Secondary | ICD-10-CM | POA: Diagnosis not present

## 2015-11-02 DIAGNOSIS — R1310 Dysphagia, unspecified: Secondary | ICD-10-CM | POA: Diagnosis not present

## 2015-11-02 DIAGNOSIS — R0602 Shortness of breath: Secondary | ICD-10-CM | POA: Diagnosis not present

## 2015-11-02 DIAGNOSIS — Z8669 Personal history of other diseases of the nervous system and sense organs: Secondary | ICD-10-CM | POA: Insufficient documentation

## 2015-11-02 DIAGNOSIS — I10 Essential (primary) hypertension: Secondary | ICD-10-CM | POA: Insufficient documentation

## 2015-11-02 DIAGNOSIS — Z8673 Personal history of transient ischemic attack (TIA), and cerebral infarction without residual deficits: Secondary | ICD-10-CM | POA: Insufficient documentation

## 2015-11-02 DIAGNOSIS — R05 Cough: Secondary | ICD-10-CM | POA: Diagnosis present

## 2015-11-02 DIAGNOSIS — Z79899 Other long term (current) drug therapy: Secondary | ICD-10-CM | POA: Insufficient documentation

## 2015-11-02 DIAGNOSIS — Z87828 Personal history of other (healed) physical injury and trauma: Secondary | ICD-10-CM | POA: Diagnosis not present

## 2015-11-02 LAB — BASIC METABOLIC PANEL
ANION GAP: 10 (ref 5–15)
BUN: 11 mg/dL (ref 6–20)
CALCIUM: 9.4 mg/dL (ref 8.9–10.3)
CO2: 25 mmol/L (ref 22–32)
Chloride: 100 mmol/L — ABNORMAL LOW (ref 101–111)
Creatinine, Ser: 0.78 mg/dL (ref 0.61–1.24)
GLUCOSE: 104 mg/dL — AB (ref 65–99)
POTASSIUM: 3.6 mmol/L (ref 3.5–5.1)
Sodium: 135 mmol/L (ref 135–145)

## 2015-11-02 LAB — CBC
HEMATOCRIT: 41 % (ref 39.0–52.0)
HEMOGLOBIN: 13.5 g/dL (ref 13.0–17.0)
MCH: 29.9 pg (ref 26.0–34.0)
MCHC: 32.9 g/dL (ref 30.0–36.0)
MCV: 90.9 fL (ref 78.0–100.0)
Platelets: 182 10*3/uL (ref 150–400)
RBC: 4.51 MIL/uL (ref 4.22–5.81)
RDW: 12.2 % (ref 11.5–15.5)
WBC: 8.9 10*3/uL (ref 4.0–10.5)

## 2015-11-02 LAB — I-STAT TROPONIN, ED: TROPONIN I, POC: 0 ng/mL (ref 0.00–0.08)

## 2015-11-02 NOTE — ED Notes (Signed)
Dr. Oni at the bedside.  

## 2015-11-02 NOTE — ED Notes (Signed)
Patient arrives with complaint of cough, shortness of breath, and chest discomfort secondary to possible aspiration of food/liquid yesterday. History of TBI and CVA. Family explains that he has been coughing and complaining of chest pain since eating yesterday. Family reports mentation is at baseline for patient. NAD, lung sounds clear.

## 2015-11-02 NOTE — ED Notes (Signed)
Patient was picked up from waiting room for xray.

## 2015-11-02 NOTE — ED Provider Notes (Signed)
CSN: [id number]     Arrival date & time 11/02/15  2222 History   By signing my name below, I, Paul Kane, attest that this documentation has been prepared under the direction and in the presence of Tomasita CrumbleAdeleke Hallie Ishida, MD.  Electronically Signed: Arlan OrganAshley Kane, ED Scribe. 11/02/2015. 11:23 PM.   Chief Complaint  Patient presents with  . Shortness of Breath  . Cough  . Aspiration   HPI  HPI Comments: Paul Kane is a 30 y.o. male with a PMHx of traumatic brain injury, intracranial shunt, stroke, paralysis, HTN, who presents to the Emergency Department here for possible aspiration this evening. Family states pt "has not been feeling well". He reports ongoing cough, shortness of breath, and chest discomfort secondary to possible aspiration of food/liquid yesterday. No aggravating or alleviating factors reported. No OTC medications or home remedies attempted at home. No recent fever, chills, nausea, or vomiting. Pt is on food thickeners at home. No known allergies to medications.  PCP: Ananias PilgrimASRES,ALEHEGN, MD    Past Medical History  Diagnosis Date  . Chronic hepatitis B (HCC)   . Traumatic brain injury (HCC)   . Intracranial shunt   . Stroke (HCC)   . 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HCC)   . Paralysis (HCC)   . Fixed pupils   . Hypertension   . Trouble swallowing   . Visual disturbance   . Weakness   . Intracranial injury of other and unspecified nature, without mention of open intracranial wound, unspecified state of consciousness   . 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HCC)   . Spastic hemiplegia affecting dominant side [facility](HCC)    Past Surgical History  Procedure Laterality Date  . Cramiectomy    . Ventriculoperitoneal shunt    . Cranioplasty    . Removal of gastrostomy tube     Family History  Problem Relation Age of Onset  . Diabetes    . Hyperlipidemia    . Hypertension     Social History  Substance Use Topics  . Smoking status: Never Smoker   . Smokeless tobacco: Never  Used  . Alcohol Use: No    Review of Systems  A complete 10 system review of systems was obtained and all systems are negative except as noted in the HPI and PMH.    Allergies  Review of patient's allergies indicates no known allergies.  Home Medications   Prior to Admission medications   Medication Sig Start Date End Date Taking? Authorizing Provider  levETIRAcetam (KEPPRA) 100 MG/ML solution Take 500 mg by mouth 2 (two) times daily.    Historical Provider, MD  VIMPAT 10 MG/ML oral solution  08/25/15   Historical Provider, MD   Triage Vitals: BP 110/72 mmHg  Pulse 87  Temp(Src) 97.4 F (36.3 C) (Oral)  Resp 16  Wt 168 lb 4.8 oz (76.34 kg)  SpO2 93%   Physical Exam  Constitutional: Vital signs are normal. He appears well-developed and well-nourished.  Non-toxic appearance. He does not appear ill. No distress.  HENT:  Head: Normocephalic and atraumatic.  Nose: Nose normal.  Mouth/Throat: Oropharynx is clear and moist. No oropharyngeal exudate.  Eyes: Conjunctivae and EOM are normal. Pupils are equal, round, and reactive to light. No scleral icterus.  Neck: Normal range of motion. Neck supple. No tracheal deviation, no edema, no erythema and normal range of motion present. No thyroid mass and no thyromegaly present.  Cardiovascular: Normal rate, regular rhythm, S1 normal, S2 normal, normal heart sounds, intact distal pulses and  normal pulses.  Exam reveals no gallop and no friction rub.   No murmur heard. Pulmonary/Chest: Effort normal and breath sounds normal. No respiratory distress. He has no wheezes. He has no rhonchi. He has no rales.  Abdominal: Soft. Normal appearance and bowel sounds are normal. He exhibits no distension, no ascites and no mass. There is no hepatosplenomegaly. There is no tenderness. There is no rebound, no guarding and no CVA tenderness.  Musculoskeletal: Normal range of motion. He exhibits no edema or tenderness.  Lymphadenopathy:    He has no cervical  adenopathy.  Neurological: He is alert. He has normal strength. No sensory deficit.  Skin: Skin is warm, dry and intact. No petechiae and no rash noted. He is not diaphoretic. No erythema. No pallor.  Psychiatric: He has a normal mood and affect.  Nursing note and vitals reviewed.   ED Course  Procedures (including critical care time)  DIAGNOSTIC STUDIES: Oxygen Saturation is 93% on RA, adequate by my interpretation.    COORDINATION OF CARE: 11:17 PM- Will order CXR, blood work, and EKG. Discussed treatment plan with pt at bedside and pt agreed to plan.     Labs Review Labs Reviewed  BASIC METABOLIC PANEL - Abnormal; Notable for the following:    Chloride 100 (*)    Glucose, Bld 104 (*)    All other components within normal limits  CBC  I-STAT TROPOININ, ED    Imaging Review Dg Chest 2 View  11/03/2015  CLINICAL DATA:  Cough dyspnea and chest discomfort. Possible aspiration yesterday. EXAM: CHEST  2 VIEW COMPARISON:  11/28/2012 FINDINGS: There is mild right hemidiaphragm elevation, unchanged. The lungs are clear. Pulmonary vasculature is normal. There is no pleural effusion. Hilar and mediastinal contours are unremarkable and unchanged. Ventriculoperitoneal shunt tubing superimposes the right hemi thorax. IMPRESSION: No active cardiopulmonary disease. Electronically Signed   By: Ellery Plunk M.D.   On: 11/03/2015 00:10   I have personally reviewed and evaluated these images and lab results as part of my medical decision-making.   EKG Interpretation   Date/Time:  Saturday November 02 2015 22:34:19 EDT Ventricular Rate:  86 PR Interval:    QRS Duration: 86 QT Interval:  346 QTC Calculation: 414 R Axis:   61 Text Interpretation:  Normal sinus rhythm Interpretation limited secondary  to artifact No significant change since last tracing Confirmed by Erroll Luna [phone]) on 11/02/2015 10:59:32 PM      MDM   Final diagnoses:  None   Patient presents to the ED  for SOB and dysphagia.  He has choking episode with feeding, which is his normal baseline but feels like something is stuck in his throat.  He is still able to tolerate foods.  Labs are unremarkable and CXR does not show any obstruction.  Patient will need GI referral for possible endoscopy.      I personally performed the services described in this documentation, which was scribed in my presence. The recorded information has been reviewed and is accurate.     Tomasita Crumble, MD 11/03/15 [phone]

## 2015-11-03 NOTE — Discharge Instructions (Signed)
Dysphagia Paul Kane, your work up today was normal.  Please see gastroenterology within 3 days for further testing into your swallowing.  If any symptoms worsen, come back to the ED immediately. Thank you. Swallowing problems (dysphagia) occur when solids and liquids seem to stick in your throat on the way down to your stomach, or the food takes longer to get to the stomach. Other symptoms include regurgitating food, noises coming from the throat, chest discomfort with swallowing, and a feeling of fullness or the feeling of something being stuck in your throat when swallowing. When blockage in your throat is complete, it may be associated with drooling. CAUSES  Problems with swallowing may occur because of problems with the muscles. The food cannot be propelled in the usual manner into your stomach. You may have ulcers, scar tissue, or inflammation in the tube down which food travels from your mouth to your stomach (esophagus), which blocks food from passing normally into the stomach. Causes of inflammation include:  Acid reflux from your stomach into your esophagus.  Infection.  Radiation treatment for cancer.  Medicines taken without enough fluids to wash them down into your stomach. You may have nerve problems that prevent signals from being sent to the muscles of your esophagus to contract and move your food down to your stomach. Globus pharyngeus is a relatively common problem in which there is a sense of an obstruction or difficulty in swallowing, without any physical abnormalities of the swallowing passages being found. This problem usually improves over time with reassurance and testing to rule out other causes. DIAGNOSIS Dysphagia can be diagnosed and its cause can be determined by tests in which you swallow a white substance that helps illuminate the inside of your throat (contrast medium) while X-rays are taken. Sometimes a flexible telescope that is inserted down your throat (endoscopy) to  look at your esophagus and stomach is used. TREATMENT   If the dysphagia is caused by acid reflux or infection, medicines may be used.  If the dysphagia is caused by problems with your swallowing muscles, swallowing therapy may be used to help you strengthen your swallowing muscles.  If the dysphagia is caused by a blockage or mass, procedures to remove the blockage may be done. HOME CARE INSTRUCTIONS  Try to eat soft food that is easier to swallow and check your weight on a daily basis to be sure that it is not decreasing.  Be sure to drink liquids when sitting upright (not lying down). SEEK MEDICAL CARE IF:  You are losing weight because you are unable to swallow.  You are coughing when you drink liquids (aspiration).  You are coughing up partially digested food. SEEK IMMEDIATE MEDICAL CARE IF:  You are unable to swallow your own saliva .  You are having shortness of breath or a fever, or both.  You have a hoarse voice along with difficulty swallowing. MAKE SURE YOU:  Understand these instructions.  Will watch your condition.  Will get help right away if you are not doing well or get worse.   This information is not intended to replace advice given to you by your health care provider. Make sure you discuss any questions you have with your health care provider.   Document Released: 07/03/2000 Document Revised: 07/27/2014 Document Reviewed: 12/23/2012 Elsevier Interactive Patient Education [employer]2016 Elsevier Inc.

## 2015-11-03 NOTE — ED Notes (Signed)
Spoke with Dr. Mora Bellmanni in regards to plan of care, PO challenge not needed. Preparing for discharge.

## 2015-11-05 ENCOUNTER — Ambulatory Visit: Payer: BC Managed Care – PPO | Admitting: Rehabilitative and Restorative Service Providers"

## 2015-11-08 ENCOUNTER — Ambulatory Visit: Payer: BC Managed Care – PPO | Admitting: Rehabilitative and Restorative Service Providers"

## 2015-11-08 DIAGNOSIS — M6281 Muscle weakness (generalized): Secondary | ICD-10-CM | POA: Diagnosis not present

## 2015-11-08 DIAGNOSIS — R531 Weakness: Secondary | ICD-10-CM

## 2015-11-08 DIAGNOSIS — R2689 Other abnormalities of gait and mobility: Secondary | ICD-10-CM

## 2015-11-08 NOTE — Patient Instructions (Signed)
Flexion: AAROM With Fingers Extended ([place])    Position (A) Helper: Support left forearm. Hold fingers straight. Motion (B) - Bend wrist, moving hand in direction of palm. - Keep fingers straight. Repeat _10__ times. Repeat with other arm. Do _2__ sessions per day.   Copyright  VHI. All rights reserved.    AROM: Wrist Extension    With right palm down, bend wrist up. Repeat _10___ times per set. Do __2__ sets per session. Do __1-2__ sessions per day.  Copyright  VHI. All rights reserved.    Extension: Facilitated    Position Helper: Stabilize left wrist. Motion - Helper folds fingers into a snug fist. (A) -Helper releases fingers and cues patient to straighten fingers immediately. (B) Repeat sequence _10__ times. Repeat with other hand. Do __2_ sessions per day. Variation: Perform with wrist bent in direction of palm. Cue patient to straighten fingers of other hand at the same time.   Copyright  VHI. All rights reserved.  AROM: Forearm Pronation / Supination    With right arm in handshake position, slowly rotate palm down until stretch is felt. Relax. Then rotate palm up until stretch is felt. Repeat ___10_ times per set. Do _2___ sets per session. Do __1-2__ sessions per day.  Copyright  VHI. All rights reserved.    Deloris Mittag, PT4/06/2016 1:44 PM Signed [facility] [address] [place], [place], [address] Phone: [phone] Fax: [phone]

## 2015-11-08 NOTE — Therapy (Signed)
[place] [address] [place], [place], [address] Phone: [phone]   Fax:  [phone]  Physical Therapy Treatment  Patient Details  Name: Paul Kane MRN: [id number] Date of Birth: [birth year] Referring Provider: Timothy Lasso  Encounter Date: 11/08/2015      PT End of Session - 11/08/15 1618    Visit Number 8   Number of Visits 9   Authorization Type [place] PPO   PT Start Time 1109   PT Stop Time 1140   PT Time Calculation (min) 31 min   Activity Tolerance Patient tolerated treatment well   Behavior During Therapy [facility] for tasks assessed/performed      Past Medical History  Diagnosis Date  . Chronic hepatitis B ([place])   . Traumatic brain injury ([place])   . Intracranial shunt   . Stroke ([place])   . 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 Paralysis ([place])   . Fixed pupils   . Hypertension   . Trouble swallowing   . Visual disturbance   . Weakness   . Intracranial injury of other and unspecified nature, without mention of open intracranial wound, unspecified state of consciousness   . 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 Spastic hemiplegia affecting dominant side [facility])     Past Surgical History  Procedure Laterality Date  . Cramiectomy    . Ventriculoperitoneal shunt    . Cranioplasty    . Removal of gastrostomy tube      There were no vitals filed for this visit.      Subjective Assessment - 11/08/15 1111    Subjective The patient is not agreeing with mom at home to do HEP.  He was evaluated at [facility] yesterday.    Patient Stated Goals "I would like him to improve his current status."   Currently in Pain? Yes   Pain Score --  hard to describe -- tightness   Pain Location Shoulder   Pain Descriptors / Indicators Tightness             [facility] Adult PT Treatment/Exercise - 11/08/15 0001    Exercises   Exercises Other Exercises   Other Exercises  Reviewed all  previous HEP and consolidated down to 4 key activities for home-=see patient instructions.  Patient and mom able to perform.             PT Education - 11/08/15 1617    Education provided Yes   Education Details reviewed all HEP   Person(s) Educated Patient;Caregiver(s);Parent(s)   Methods Explanation;Demonstration;Handout   Comprehension Verbalized understanding;Returned demonstration          PT Short Term Goals - 09/09/15 1230    PT SHORT TERM GOAL #1   Title STGs = LTGs           PT Long Term Goals - 11/08/15 1618    PT LONG TERM GOAL #1   Title The patient will demonstrate initiation and completion of HEP with supervision from caregiver at home and at [facility] to maximize functional gains made in PT.   Time 4   Period Weeks   Status Achieved   PT LONG TERM GOAL #2   Title The patient will move object from floor to countertop x5 mod I to demonstrate ability to reach for object on floor for safe performance of ADLs.   Time 4   Period Weeks   Status Achieved   PT LONG TERM GOAL #3  Title The patient will ambulate 500' outdoors mod I over varying surfaces and surface heights including grass, curbs, and inclines to demonstrate improved functional mobility with community ambulation.   Time 4   Period Weeks   Status Achieved   PT LONG TERM GOAL #4   Title The patient will demonstrate initiation and completion with home stretching program for R UE requiring assist prn from caregiver to assist with decreasing tone.   Time 4   Period Weeks   Status Achieved   PT LONG TERM GOAL #5   Title New Goal:  Patient and caregiver will demonstrate progression of HEP for more isolated wrist control and UE motion.   Baseline Met on 11/08/2015   Time 4   Period Weeks   Status Achieved               Plan - 11/08/15 1619    Clinical Impression Statement The patient and his mother are performing HEP--patient met LTGs for PT.     PT Treatment/Interventions ADLs/Self Care Home  Management;Moist Heat;DME Instruction;Gait training;Stair training;Functional mobility training;Therapeutic activities;Therapeutic exercise;Balance training;Neuromuscular re-education;Patient/family education;Orthotic Fit/Training;Manual techniques;Passive range of motion;Vestibular   PT Next Visit Plan discharge today   Consulted and Agree with Plan of Care Patient;Family member/caregiver   Family Member Consulted mother      Patient will benefit from skilled therapeutic intervention in order to improve the following deficits and impairments:  Abnormal gait, Decreased balance, Decreased mobility, Decreased range of motion, Decreased strength, Difficulty walking, Increased muscle spasms, Impaired flexibility, Impaired tone, Impaired UE functional use, Postural dysfunction  Visit Diagnosis: Other abnormalities of gait and mobility  Generalized weakness     Problem List Patient Active Problem List   Diagnosis Date Noted  . 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 Deafness, sensorineural 04/20/2012  . Buzzing in ear 04/20/2012  . Leaking percutaneous endoscopic gastrostomy (PEG) tube ([place]) 02/04/2012  . Traumatic brain injury ([place]) 10/12/2011  . Cerebral infarct ([place]) 10/12/2011  . Spastic hemiplegia affecting dominant side ([place]) 10/12/2011  . Dysphagia 10/12/2011  . Functioning G-tube 03/24/2011  . ASPIRATION PNEUMONIA 10/06/2010  . 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 HEMIPARESIS, DOMINANT SIDE, RIGHT 07/28/2010  . CEREBRAL HEMORRHAGE 07/28/2010  . PERSONAL HISTORY OF TRAUMATIC BRAIN INJURY 07/02/2010  . ALLERGIC RHINITIS CAUSE UNSPECIFIED 10/25/2008  . GASTROENTERITIS 08/09/2008  . CONTACT DERMATITIS&OTHER ECZEMA DUE DETERGENTS 08/18/2007  . HEPATITIS B, CHRONIC 06/28/2007     PHYSICAL THERAPY DISCHARGE SUMMARY  Visits from Start of Care: 8  Current functional level related to goals / functional outcomes: See goals above   Remaining deficits: Chronic hemimotor  impairments   Education / Equipment: HEP and family education.  Plan: Patient agrees to discharge.  Patient goals were met. Patient is being discharged due to meeting the stated rehab goals.  ?????       Thank you for the referral of this patient. Rudell Cobb, MPT   [place], PT 11/08/2015, 4:19 PM  [place] [address] [place], [place], [address] Phone: [phone]   Fax:  [phone]  Name: Paul Kane MRN: [id number] Date of Birth: [birth year]

## 2015-12-02 ENCOUNTER — Encounter: Payer: Self-pay | Admitting: Physical Medicine & Rehabilitation

## 2015-12-02 ENCOUNTER — Encounter
Payer: BC Managed Care – PPO | Attending: Physical Medicine & Rehabilitation | Admitting: Physical Medicine & Rehabilitation

## 2015-12-02 VITALS — BP 106/61 | HR 79 | Resp 16

## 2015-12-02 DIAGNOSIS — S062X5S 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 pre-existing conscious levels, sequela: Secondary | ICD-10-CM | POA: Diagnosis not present

## 2015-12-02 DIAGNOSIS — R1310 Dysphagia, unspecified: Secondary | ICD-10-CM | POA: Insufficient documentation

## 2015-12-02 DIAGNOSIS — I69354 Hemiplegia and hemiparesis following cerebral infarction affecting left non-dominant side: Secondary | ICD-10-CM | POA: Insufficient documentation

## 2015-12-02 DIAGNOSIS — G935 Compression of brain: Secondary | ICD-10-CM | POA: Insufficient documentation

## 2015-12-02 DIAGNOSIS — H539 Unspecified visual disturbance: Secondary | ICD-10-CM | POA: Insufficient documentation

## 2015-12-02 DIAGNOSIS — B181 Chronic viral hepatitis B without delta-agent: Secondary | ICD-10-CM | POA: Insufficient documentation

## 2015-12-02 DIAGNOSIS — G8110 Spastic hemiplegia affecting unspecified side: Secondary | ICD-10-CM

## 2015-12-02 DIAGNOSIS — Z9689 Presence of other specified functional implants: Secondary | ICD-10-CM | POA: Insufficient documentation

## 2015-12-02 DIAGNOSIS — I638 Other cerebral infarction: Secondary | ICD-10-CM | POA: Insufficient documentation

## 2015-12-02 DIAGNOSIS — R531 Weakness: Secondary | ICD-10-CM | POA: Diagnosis not present

## 2015-12-02 DIAGNOSIS — S069X0S Unspecified intracranial injury without loss of consciousness, sequela: Secondary | ICD-10-CM | POA: Insufficient documentation

## 2015-12-02 DIAGNOSIS — I10 Essential (primary) hypertension: Secondary | ICD-10-CM | POA: Insufficient documentation

## 2015-12-02 NOTE — Progress Notes (Signed)
Dysport Injection for spasticity using needle EMG guidance Indication: G81.10  Dilution: 250 Units/ml Total Units Injected: 1000 Indication: Severe spasticity which interferes with ADL,mobility and/or hygiene and is unresponsive to medication management and other conservative care Informed consent was obtained after describing risks and benefits of the procedure with the patient. This includes bleeding, bruising, infection, excessive weakness, or medication side effects. A REMS form is on file and signed.  Needle: 50mm injectable monopolar needle electrode  Number of units per muscle Pectoralis Major 0 units Pectoralis Minor 0 units Biceps 0 units Brachioradialis 0 units FCR 100 units FCU 100 units FDS 300 units FDP 300 units FPL 0 units Pronator Teres 200 units Pronator Quadratus units Quadriceps 0 units Gastroc/soleus 0 units Hamstrings 0 units Tibialis Posterior 0 units EHL 0 units   All injections were done after obtaining appropriate EMG activity and after negative drawback for blood. The patient tolerated the procedure well. Post procedure instructions were given. A followup appointment was made for 3 months.

## 2015-12-20 DIAGNOSIS — R471 Dysarthria and anarthria: Secondary | ICD-10-CM | POA: Insufficient documentation

## 2016-03-03 ENCOUNTER — Encounter
Payer: BC Managed Care – PPO | Attending: Physical Medicine & Rehabilitation | Admitting: Physical Medicine & Rehabilitation

## 2016-03-03 ENCOUNTER — Encounter: Payer: Self-pay | Admitting: Physical Medicine & Rehabilitation

## 2016-03-03 VITALS — BP 105/71 | HR 75 | Resp 16

## 2016-03-03 DIAGNOSIS — I69354 Hemiplegia and hemiparesis following cerebral infarction affecting left non-dominant side: Secondary | ICD-10-CM | POA: Insufficient documentation

## 2016-03-03 DIAGNOSIS — S069X0S Unspecified intracranial injury without loss of consciousness, sequela: Secondary | ICD-10-CM | POA: Insufficient documentation

## 2016-03-03 DIAGNOSIS — Z9689 Presence of other specified functional implants: Secondary | ICD-10-CM | POA: Diagnosis not present

## 2016-03-03 DIAGNOSIS — B181 Chronic viral hepatitis B without delta-agent: Secondary | ICD-10-CM | POA: Diagnosis not present

## 2016-03-03 DIAGNOSIS — G8110 Spastic hemiplegia affecting unspecified side: Secondary | ICD-10-CM | POA: Diagnosis not present

## 2016-03-03 DIAGNOSIS — I10 Essential (primary) hypertension: Secondary | ICD-10-CM | POA: Insufficient documentation

## 2016-03-03 DIAGNOSIS — R1310 Dysphagia, unspecified: Secondary | ICD-10-CM | POA: Diagnosis not present

## 2016-03-03 DIAGNOSIS — I638 Other cerebral infarction: Secondary | ICD-10-CM | POA: Diagnosis not present

## 2016-03-03 DIAGNOSIS — G935 Compression of brain: Secondary | ICD-10-CM | POA: Insufficient documentation

## 2016-03-03 DIAGNOSIS — H539 Unspecified visual disturbance: Secondary | ICD-10-CM | POA: Insufficient documentation

## 2016-03-03 DIAGNOSIS — R531 Weakness: Secondary | ICD-10-CM | POA: Diagnosis not present

## 2016-03-03 NOTE — Progress Notes (Addendum)
Subjective:    Patient ID: Paul Kane, male    DOB: [birth year], 30 y.o.   MRN: [id number]  HPI  Loletha CarrowHaryi is back regarding his spastic left hemiparesis. He had good results once again with the dysport injections. He continues to be very active with his stretching program and with [facility]. He denies pain. He swallows without any difficulties.    Pain Inventory Average Pain 0 Pain Right Now 0 My pain is NA  In the last 24 hours, has pain interfered with the following? General activity NA Relation with others NA Enjoyment of life NA What TIME of day is your pain at its worst? NA Sleep (in general) NA  Pain is worse with: NA Pain improves with: NA Relief from Meds: NA  Mobility walk without assistance ability to climb steps?  yes do you drive?  no Do you have any goals in this area?  yes  Function disabled: date disabled 11/22/2009 I need assistance with the following:  feeding, dressing, bathing, meal prep, household duties and shopping Do you have any goals in this area?  yes  Neuro/Psych weakness  Prior Studies x-rays CT/MRI  Physicians involved in your care Any changes since last visit?  no   Family History  Problem Relation Age of Onset  . Diabetes    . Hyperlipidemia    . Hypertension     Social History   Social History  . Marital status: Single    Spouse name: N/A  . Number of children: N/A  . Years of education: N/A   Social History Main Topics  . Smoking status: Never Smoker  . Smokeless tobacco: Never Used  . Alcohol use No  . Drug use: No  . Sexual activity: Not Asked   Other Topics Concern  . None   Social History Narrative  . None   Past Surgical History:  Procedure Laterality Date  . cramiectomy    . CRANIOPLASTY    . REMOVAL OF GASTROSTOMY TUBE    . VENTRICULOPERITONEAL SHUNT     Past Medical History:  Diagnosis Date  . 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HCC)   . Chronic  hepatitis B (HCC)   . Fixed pupils   . 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HCC)   . Hypertension   . Intracranial injury of other and unspecified nature, without mention of open intracranial wound, unspecified state of consciousness   . Intracranial shunt   . Paralysis (HCC)   . Spastic hemiplegia affecting dominant side (HCC)   . Stroke (HCC)   . Traumatic brain injury (HCC)   . Trouble swallowing   . Visual disturbance   . Weakness    BP 105/71   Pulse 75   Resp 16   SpO2 95%   Opioid Risk Score:   Fall Risk Score:  `1  Depression screen PHQ 2/9  Depression screen [facility] 2/9 09/03/2015 03/27/2015 10/29/2014  Decreased Interest 1 1 1   Down, Depressed, Hopeless 1 1 1   PHQ - 2 Score 2 2 2   Altered sleeping - - 2  Tired, decreased energy - - 1  Change in appetite - - 1  Feeling bad or failure about yourself  - - 3  Trouble concentrating - - 2  Moving slowly or fidgety/restless - - 1  Suicidal thoughts - - 1  PHQ-9 Score - - 13  Some recent data might be hidden    Review of Systems  Neurological: Positive for weakness.  All other systems reviewed and are negative.      Objective:   Physical Exam  Constitutional: He appears well-developed and well-nourished.  HENT:  Head: Normocephalic.  Eyes: Conjunctivae and EOM are normal. Pupils are equal, round, and reactive to light.  Cardiovascular: Normal rate.  Pulmonary/Chest: Effort normal.  Abdominal: Soft.  Neurological: He is alert. A cranial nerve deficit is present.  dyscongugate gaze, speech severely dysarthric but better. His volume is good. Right upper extremity strength is improving with 3-4/5 strength at the shoulder and elbow. He's 1+/5 at the wrist and fingers with flexion. Tone: Pronators are 1+-2/4. Marland Kitchen. Biceps BR are 1/4, 1- 1+/4 right wrist and finger flexors, FPL are 1/4. The right pectoralis major and minor trace. Right lower extremity is grossly 4/5 at the hip and knee. Ankle dorsiflexion and plantar flexion are  3/5-3+ out of 5. He has continued heel cord tightness to the right ankle. He has slight diminishment of his sensory function in both right arm and leg.  Uses less steppage and circumduction with gait and clears better. Cognitively, more alert and aware. communicates with short words and phrases. Speech still very dysarthric  Skin: Skin is warm and intact  Assessment & Plan:   ASSESSMENT:  1. History of traumatic brain injury with hydrocephalus, meningitis,  and right pontine stroke.  2. Spastic tetraplegia, dominant side more affected 3. severe oropharyngeal dysphagia--improved--tolerating diet well     PLAN:  1. Continue with the dyna-splint RUE and regular exercise and stretching.   2. Good results with Dysport to  RUE  1000u (finger/wrist flexors/thumb flexor lumbricals and pronators). Repeat injection not indicated at this time.  3. Appropriate gait technique/strategies to continue.  4. Follow up with me in about 3 months. Can decide upon injection or not prior to this appointment. If he continues to do well his family will call to postpone appt   15 minutes of face to face patient care time were spent during this visit. All questions were encouraged and answered.   Ranelle OysterZachary T. Swartz, MD, [facility] 03/03/2016

## 2016-03-03 NOTE — Patient Instructions (Signed)
CALL ME A FEW WEEKS PRIOR TO APPOINTMENT IF YOU WOULD LIKE TO DO BOTOX OR IF YOU WANT TO MOVE APPOINTMENT BACK.

## 2016-06-03 ENCOUNTER — Encounter: Payer: Self-pay | Admitting: Physical Medicine & Rehabilitation

## 2016-06-03 ENCOUNTER — Encounter
Payer: BC Managed Care – PPO | Attending: Physical Medicine & Rehabilitation | Admitting: Physical Medicine & Rehabilitation

## 2016-06-03 VITALS — BP 93/59 | HR 83 | Resp 14

## 2016-06-03 DIAGNOSIS — G8110 Spastic hemiplegia affecting unspecified side: Secondary | ICD-10-CM | POA: Diagnosis not present

## 2016-06-03 DIAGNOSIS — G935 Compression of brain: Secondary | ICD-10-CM | POA: Insufficient documentation

## 2016-06-03 DIAGNOSIS — S069X0S Unspecified intracranial injury without loss of consciousness, sequela: Secondary | ICD-10-CM | POA: Diagnosis present

## 2016-06-03 DIAGNOSIS — R531 Weakness: Secondary | ICD-10-CM | POA: Insufficient documentation

## 2016-06-03 DIAGNOSIS — B181 Chronic viral hepatitis B without delta-agent: Secondary | ICD-10-CM | POA: Diagnosis not present

## 2016-06-03 DIAGNOSIS — I69354 Hemiplegia and hemiparesis following cerebral infarction affecting left non-dominant side: Secondary | ICD-10-CM | POA: Insufficient documentation

## 2016-06-03 DIAGNOSIS — I638 Other cerebral infarction: Secondary | ICD-10-CM | POA: Insufficient documentation

## 2016-06-03 DIAGNOSIS — R1310 Dysphagia, unspecified: Secondary | ICD-10-CM | POA: Insufficient documentation

## 2016-06-03 DIAGNOSIS — Z9689 Presence of other specified functional implants: Secondary | ICD-10-CM | POA: Insufficient documentation

## 2016-06-03 DIAGNOSIS — I10 Essential (primary) hypertension: Secondary | ICD-10-CM | POA: Insufficient documentation

## 2016-06-03 DIAGNOSIS — H539 Unspecified visual disturbance: Secondary | ICD-10-CM | POA: Insufficient documentation

## 2016-06-03 NOTE — Patient Instructions (Signed)
PLEASE CALL ME WITH ANY PROBLEMS OR QUESTIONS ([phone])  

## 2016-06-03 NOTE — Progress Notes (Signed)
Subjective:    Patient ID: Paul Kane, male    DOB: [birth year], 30 y.o.   MRN: [id number]  HPI   Paul Kane is here in follow up of his TBI/CVA and associated spastic right hemiparesis. He has had good results with botox in the past but has begun to develop more tightness in the right hand and wrist.   [facility]. It sounds as if it's PT.    Pain Inventory Average Pain 0 Pain Right Now 0 My pain is NA  In the last 24 hours, has pain interfered with the following? General activity NA Relation with others NA Enjoyment of life NA What TIME of day is your pain at its worst? NA Sleep (in general) NA  Pain is worse with: NA Pain improves with: NA Relief from Meds: NA  Mobility walk without assistance ability to climb steps?  yes do you drive?  no Do you have any goals in this area?  yes  Function disabled: date disabled 11/22/2009 I need assistance with the following:  feeding, dressing, bathing, meal prep, household duties and shopping Do you have any goals in this area?  yes  Neuro/Psych No problems in this area  Prior Studies Any changes since last visit?  no  Physicians involved in your care Any changes since last visit?  no   Family History  Problem Relation Age of Onset  . Diabetes    . Hyperlipidemia    . Hypertension     Social History   Social History  . Marital status: Single    Spouse name: N/A  . Number of children: N/A  . Years of education: N/A   Social History Main Topics  . Smoking status: Never Smoker  . Smokeless tobacco: Never Used  . Alcohol use No  . Drug use: No  . Sexual activity: Not Asked   Other Topics Concern  . None   Social History Narrative  . None   Past Surgical History:  Procedure Laterality Date  . cramiectomy    . CRANIOPLASTY    . REMOVAL OF GASTROSTOMY TUBE    . VENTRICULOPERITONEAL SHUNT     Past Medical History:  Diagnosis Date  . Cerebral thrombosis  with cerebral infarction (HCC)   . Chronic hepatitis B (HCC)   . Fixed pupils   . 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HCC)   . Hypertension   . Intracranial injury of other and unspecified nature, without mention of open intracranial wound, unspecified state of consciousness   . Intracranial shunt   . Paralysis (HCC)   . Spastic hemiplegia affecting dominant side (HCC)   . Stroke (HCC)   . Traumatic brain injury (HCC)   . Trouble swallowing   . Visual disturbance   . Weakness    BP (!) 93/59   Pulse 83   Resp 14   SpO2 97%   Opioid Risk Score:   Fall Risk Score:  `1  Depression screen PHQ 2/9  Dep[facility] screen [facility] 09/03/2015 03/27/2015 10/29/2014  Decreased Interest 1 1 1   Down, Depressed, Hopeless 1 1 1   PHQ - 2 Score 2 2 2   Altered sleeping - - 2  Tired, decreased energy - - 1  Change in appetite - - 1  Feeling bad or failure about yourself  - - 3  Trouble concentrating - - 2  Moving slowly or fidgety/restless - - 1  Suicidal thoughts - - 1  PHQ-9 Score - - 13  Some recent data might be hidden     Review of Systems  Constitutional: Negative.   HENT: Negative.   Eyes: Negative.   Respiratory: Negative.   Cardiovascular: Negative.   Gastrointestinal: Negative.   Endocrine: Negative.   Genitourinary: Negative.   Musculoskeletal: Negative.   Skin: Negative.   Allergic/Immunologic: Negative.   Neurological: Negative.   Hematological: Negative.   Psychiatric/Behavioral: Negative.   All other systems reviewed and are negative.      Objective:   Physical Exam   Constitutional:   well-nourished.  HENT:  Head: Normocephalic.  Eyes: Conjunctivae and EOM are normal. Pupils are equal, round, and reactive to light.  Cardiovascular: Normal rate.  Pulmonary/Chest: Effort normal.  Abdominal: Soft.  Neurological: He is alert. A cranial nerve deficitis present.  Dyscongugate gaze, speech severely dysarthric but better. His volume is good. Right upper extremity  strength is improving with 3-4/5 strength at the shoulder and elbow. He's 1+/5 at the wrist and fingers with flexion. Tone: Pronators are 2/4. Marland Kitchen. Biceps BR are 2/4, 2-3/4 right wrist and finger flexors, FPL are 1+/4. The right pectoralis major and minor trace. Right lower extremity is grossly 4/5 at the hip and knee. Ankle dorsiflexion and plantar flexion are 3/5-3+ out of 5. He has continued heel cord tightness to the right ankle. He has slight diminishment of his sensory function in both right arm and leg.  Uses less steppage and circumduction with gait and clears better. Cognitively, more alert and aware. communicates with short words and phrases. Speech still very dysarthric Skin: Skin is warm and intact  Assessment & Plan:  ASSESSMENT:  1. History of traumatic brain injury with hydrocephalus, meningitis,  and right pontine stroke.  2. Spastic tetraplegia, dominant side more affected 3. severe oropharyngeal dysphagia--improved--tolerating diet well  4. Absence seizures    PLAN:  1. Continue with the JAS splint--- RUE and regular exercise and stretching.  2. Good results with Dysport to RUE  1000u (finger/wrist flexors/thumb flexor lumbricals and pronators). Repeat injection not indicated at this time.  3. Appropriate gait technique/strategies to continue. continue efforts at HP 4. Follow up with me in about a month for injections  15 minutes of face to face patient care time were spent during this visit. All questions were encouraged and answered.

## 2016-06-30 ENCOUNTER — Encounter
Payer: BC Managed Care – PPO | Attending: Physical Medicine & Rehabilitation | Admitting: Physical Medicine & Rehabilitation

## 2016-06-30 ENCOUNTER — Encounter: Payer: Self-pay | Admitting: Physical Medicine & Rehabilitation

## 2016-06-30 VITALS — BP 100/69 | HR 83 | Resp 14

## 2016-06-30 DIAGNOSIS — R531 Weakness: Secondary | ICD-10-CM | POA: Diagnosis not present

## 2016-06-30 DIAGNOSIS — G8110 Spastic hemiplegia affecting unspecified side: Secondary | ICD-10-CM | POA: Diagnosis not present

## 2016-06-30 DIAGNOSIS — G935 Compression of brain: Secondary | ICD-10-CM | POA: Diagnosis not present

## 2016-06-30 DIAGNOSIS — I638 Other cerebral infarction: Secondary | ICD-10-CM | POA: Insufficient documentation

## 2016-06-30 DIAGNOSIS — Z9689 Presence of other specified functional implants: Secondary | ICD-10-CM | POA: Insufficient documentation

## 2016-06-30 DIAGNOSIS — B181 Chronic viral hepatitis B without delta-agent: Secondary | ICD-10-CM | POA: Insufficient documentation

## 2016-06-30 DIAGNOSIS — S069X0S Unspecified intracranial injury without loss of consciousness, sequela: Secondary | ICD-10-CM | POA: Insufficient documentation

## 2016-06-30 DIAGNOSIS — R1310 Dysphagia, unspecified: Secondary | ICD-10-CM | POA: Diagnosis not present

## 2016-06-30 DIAGNOSIS — I69354 Hemiplegia and hemiparesis following cerebral infarction affecting left non-dominant side: Secondary | ICD-10-CM | POA: Insufficient documentation

## 2016-06-30 DIAGNOSIS — I10 Essential (primary) hypertension: Secondary | ICD-10-CM | POA: Diagnosis not present

## 2016-06-30 DIAGNOSIS — H539 Unspecified visual disturbance: Secondary | ICD-10-CM | POA: Insufficient documentation

## 2016-06-30 NOTE — Patient Instructions (Signed)
PLEASE CALL ME WITH ANY PROBLEMS OR QUESTIONS ([phone])   HAPPY HOLIDAYS!!!!                    *                * *             *   *   *         *  *   *  *  *     *  *  *  *  *  *  * *  *  *  *  *  *  *  *  *  * *               *  *               *  *               *  *  

## 2016-06-30 NOTE — Progress Notes (Signed)
Botox Injection for spasticity using needle EMG guidance Indication: No diagnosis found.   Dilution: 500 Units/2.825ml        Total Units Injected: 1000 Indication: Severe spasticity which interferes with ADL,mobility and/or  hygiene and is unresponsive to medication management and other conservative care Informed consent was obtained after describing risks and benefits of the procedure with the patient. This includes bleeding, bruising, infection, excessive weakness, or medication side effects. A REMS form is on file and signed.  Needle: 50mm injectable monopolar needle electrode  Number of units per muscle Pectoralis Major 0 units Pectoralis Minor 0 units Biceps 0 units Brachioradialis 0 units FCR 100 units FCU 100 units FDS 225 units FDP 225 units FPL 0 units Pronator Teres 200 units Pronator Quadratus 150 units  All injections were done after obtaining appropriate EMG activity and after negative drawback for blood. The patient tolerated the procedure well. Post procedure instructions were given. No Follow-up on file.   Referral was made for outpt PT/OT. Needs resting wrist hand splint

## 2016-07-29 ENCOUNTER — Telehealth: Payer: Self-pay | Admitting: *Deleted

## 2016-07-29 NOTE — Telephone Encounter (Signed)
[facility] week [facility]  Monday 07/27/16  and calling to see if we got it. If we did not please call her at [phone] Thayer Ohm(Chris PT)

## 2016-07-30 NOTE — Telephone Encounter (Signed)
Barbara CowerJason - have you seen a fax come through for this patient on a brace?

## 2016-08-03 NOTE — Telephone Encounter (Signed)
Paul Kane from [employer] has called to check the status for an order for a knee brace. Paul Kane can be reached at [phone] if there are any questions or concerns.

## 2016-09-02 ENCOUNTER — Ambulatory Visit: Payer: BC Managed Care – PPO | Admitting: Physical Medicine & Rehabilitation

## 2016-09-07 ENCOUNTER — Encounter
Payer: BC Managed Care – PPO | Attending: Physical Medicine & Rehabilitation | Admitting: Physical Medicine & Rehabilitation

## 2016-09-07 ENCOUNTER — Encounter: Payer: Self-pay | Admitting: Physical Medicine & Rehabilitation

## 2016-09-07 VITALS — BP 109/70 | HR 90 | Resp 14 | Wt 170.0 lb

## 2016-09-07 DIAGNOSIS — R531 Weakness: Secondary | ICD-10-CM | POA: Diagnosis not present

## 2016-09-07 DIAGNOSIS — I69354 Hemiplegia and hemiparesis following cerebral infarction affecting left non-dominant side: Secondary | ICD-10-CM | POA: Diagnosis not present

## 2016-09-07 DIAGNOSIS — I10 Essential (primary) hypertension: Secondary | ICD-10-CM | POA: Insufficient documentation

## 2016-09-07 DIAGNOSIS — B181 Chronic viral hepatitis B without delta-agent: Secondary | ICD-10-CM | POA: Diagnosis not present

## 2016-09-07 DIAGNOSIS — S069X0S Unspecified intracranial injury without loss of consciousness, sequela: Secondary | ICD-10-CM | POA: Insufficient documentation

## 2016-09-07 DIAGNOSIS — Z9689 Presence of other specified functional implants: Secondary | ICD-10-CM | POA: Diagnosis not present

## 2016-09-07 DIAGNOSIS — S062X5S 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 pre-existing conscious levels, sequela: Secondary | ICD-10-CM | POA: Diagnosis not present

## 2016-09-07 DIAGNOSIS — G935 Compression of brain: Secondary | ICD-10-CM | POA: Insufficient documentation

## 2016-09-07 DIAGNOSIS — R1310 Dysphagia, unspecified: Secondary | ICD-10-CM | POA: Insufficient documentation

## 2016-09-07 DIAGNOSIS — G8110 Spastic hemiplegia affecting unspecified side: Secondary | ICD-10-CM

## 2016-09-07 DIAGNOSIS — I638 Other cerebral infarction: Secondary | ICD-10-CM | POA: Insufficient documentation

## 2016-09-07 DIAGNOSIS — H539 Unspecified visual disturbance: Secondary | ICD-10-CM | POA: Insufficient documentation

## 2016-09-07 NOTE — Patient Instructions (Signed)
PLEASE FEEL FREE TO CALL OUR OFFICE WITH ANY PROBLEMS OR QUESTIONS ([phone])      

## 2016-09-07 NOTE — Progress Notes (Signed)
Subjective:    Patient ID: Paul Kane, male    DOB: Jul 09, 1986, 31 y.o.   MRN: [id number]  HPI   Paul Kane is here in follow up of his spastic hemiparesis. He has had good response again with botulinum toxin injections. They are beginning to wear off again. Mother is also concerned that the OT they worked with at [facility] gave them some bad advice regarding stretching.  PT is working with him and recommended a knee brace.  Mom brought video of the brace and his gait today.    Pain Inventory Average Pain no pain Pain Right Now no pain My pain is no pain  In the last 24 hours, has pain interfered with the following? General activity no pain Relation with others no pain Enjoyment of life no pain What TIME of day is your pain at its worst? no pain Sleep (in general) Good  Pain is worse with: no pain Pain improves with: no pain Relief from Meds: no pain  Mobility walk without assistance  Function I need assistance with the following:  feeding, dressing, bathing, toileting, meal prep, household duties and shopping  Neuro/Psych trouble walking  Prior Studies Any changes since last visit?  no  Physicians involved in your care Any changes since last visit?  no   Family History  Problem Relation Age of Onset  . Diabetes    . Hyperlipidemia    . Hypertension     Social History   Social History  . Marital status: Single    Spouse name: N/A  . Number of children: N/A  . Years of education: N/A   Social History Main Topics  . Smoking status: Never Smoker  . Smokeless tobacco: Never Used  . Alcohol use No  . Drug use: No  . Sexual activity: Not on file   Other Topics Concern  . Not on file   Social History Narrative  . No narrative on file   Past Surgical History:  Procedure Laterality Date  . cramiectomy    . CRANIOPLASTY    . REMOVAL OF GASTROSTOMY TUBE    . VENTRICULOPERITONEAL SHUNT     Past Medical History:  Diagnosis Date  . Cerebral thrombosis with  cerebral infarction (HCC)   . Chronic hepatitis B (HCC)   . Fixed pupils   . 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HCC)   . Hypertension   . Intracranial injury of other and unspecified nature, without mention of open intracranial wound, unspecified state of consciousness   . Intracranial shunt   . Paralysis (HCC)   . Spastic hemiplegia affecting dominant side (HCC)   . Stroke (HCC)   . Traumatic brain injury (HCC)   . Trouble swallowing   . Visual disturbance   . Weakness    BP 109/70   Pulse 90   Resp 14   Wt 170 lb (77.1 kg)   SpO2 97%   BMI 25.85 kg/m   Opioid Risk Score:   Fall Risk Score:  `1  Depression screen PHQ 2/9  Depression screen [facility] 2/9 09/03/2015 03/27/2015 10/29/2014  Decreased Interest 1 1 1   Down, Depressed, Hopeless 1 1 1   PHQ - 2 Score 2 2 2   Altered sleeping - - 2  Tired, decreased energy - - 1  Change in appetite - - 1  Feeling bad or failure about yourself  - - 3  Trouble concentrating - - 2  Moving slowly or fidgety/restless - - 1  Suicidal thoughts - - 1  PHQ-9 Score - - 13  Some recent data might be hidden     Review of Systems  Constitutional: Negative.   HENT: Negative.   Eyes: Negative.   Respiratory: Negative.   Cardiovascular: Negative.   Gastrointestinal: Negative.   Endocrine: Negative.   Genitourinary: Negative.   Musculoskeletal: Negative.   Skin: Negative.   Allergic/Immunologic: Negative.   Neurological: Negative.   Hematological: Negative.   Psychiatric/Behavioral: Negative.   All other systems reviewed and are negative.      Objective:   Physical Exam  Constitutional:   well-nourished.  HENT:  Head: Normocephalic.  Eyes: Conjunctivae and EOM are normal. Pupils are equal, round, and reactive to light.  Cardiovascular: RRR.  Pulmonary/Chest: Effort normal.  Abdominal: Soft.  Neurological: He is alert. A cranial nerve deficitis present.  Dyscongugate gaze, speech severely dysarthric but better. His volume is good.  Right upper extremity strength is improving with 3-4/5 strength at the shoulder and elbow. He's 1+/5 at the wrist and fingers with flexion. Tone: Pronators are 2/4. Marland Kitchen. Biceps BR are 1-2/4, 2-3/4 right wrist and finger flexors, FPL are 1/4. Fingers are subluxed somewhat.    Right lower extremity is grossly 4/5 at the hip and knee. Ankle dorsiflexion and plantar flexion are 3/5-3+ out of 5. He has continued heel cord tightness to the right ankle but i'm able to PROM to neutral. He has slight diminishment of his sensory function in both right arm and leg. clears leg better in gait but some recurvatum noted at knee. Cognitively, more alert and aware. communicates with short words and phrases. Speech still very dysarthric Skin: Skin is warm and intact  Assessment & Plan:  ASSESSMENT:  1. History of traumatic brain injury with hydrocephalus, meningitis,  and right pontine stroke.  2. Spastic tetraplegia, dominant side more affected 3. severe oropharyngeal dysphagia--improved--tolerating diet well 4. Absence seizures    PLAN:  1. Continue with the JAS splint--- RUE and regular exercise and stretching especially in extension and supination. 2. Repeat Dysport to RUE 1000u (again to the finger/wrist flexors/thumb flexor lumbricals and pronators). In about one month.  3. Appropriate gait technique/strategies to continue. [facility]   -ordered a right hinged knee brace to prevent recurvatum 4. Follow up with me in about a month for botulinum injections 15 minutes of face to face patient care time were spent during this visit. All questions were encouraged and answered.

## 2016-10-05 ENCOUNTER — Encounter: Payer: Self-pay | Admitting: Physical Medicine & Rehabilitation

## 2016-10-05 ENCOUNTER — Encounter
Payer: BC Managed Care – PPO | Attending: Physical Medicine & Rehabilitation | Admitting: Physical Medicine & Rehabilitation

## 2016-10-05 VITALS — BP 104/71 | HR 76

## 2016-10-05 DIAGNOSIS — G8110 Spastic hemiplegia affecting unspecified side: Secondary | ICD-10-CM | POA: Diagnosis not present

## 2016-10-05 DIAGNOSIS — S069X0S Unspecified intracranial injury without loss of consciousness, sequela: Secondary | ICD-10-CM | POA: Insufficient documentation

## 2016-10-05 DIAGNOSIS — R531 Weakness: Secondary | ICD-10-CM | POA: Diagnosis not present

## 2016-10-05 DIAGNOSIS — Z9689 Presence of other specified functional implants: Secondary | ICD-10-CM | POA: Diagnosis not present

## 2016-10-05 DIAGNOSIS — I638 Other cerebral infarction: Secondary | ICD-10-CM | POA: Insufficient documentation

## 2016-10-05 DIAGNOSIS — H539 Unspecified visual disturbance: Secondary | ICD-10-CM | POA: Insufficient documentation

## 2016-10-05 DIAGNOSIS — I10 Essential (primary) hypertension: Secondary | ICD-10-CM | POA: Diagnosis not present

## 2016-10-05 DIAGNOSIS — B181 Chronic viral hepatitis B without delta-agent: Secondary | ICD-10-CM | POA: Insufficient documentation

## 2016-10-05 DIAGNOSIS — I69354 Hemiplegia and hemiparesis following cerebral infarction affecting left non-dominant side: Secondary | ICD-10-CM | POA: Insufficient documentation

## 2016-10-05 DIAGNOSIS — R1310 Dysphagia, unspecified: Secondary | ICD-10-CM | POA: Diagnosis not present

## 2016-10-05 DIAGNOSIS — G935 Compression of brain: Secondary | ICD-10-CM | POA: Insufficient documentation

## 2016-10-05 NOTE — Progress Notes (Signed)
Dysport Injection for spasticity using needle EMG guidance Indication: Spastic hemiplegia affecting dominant side (HCC)   Dilution: 500 Units/4 ml        Total Units Injected: 1000 Indication: Severe spasticity which interferes with ADL,mobility and/or  hygiene and is unresponsive to medication management and other conservative care Informed consent was obtained after describing risks and benefits of the procedure with the patient. This includes bleeding, bruising, infection, excessive weakness, or medication side effects. A REMS form is on file and signed.  Needle: 50mm injectable monopolar needle electrode  Number of units per muscle Pectoralis Major 0 units Pectoralis Minor 0 units Biceps 200 units Brachioradialis 0 units FCR 25 units FCU 25 units FDS 175 units FDP 175 units FPL 0 units Pronator Teres 300 units Pronator Quadratus 100 units  All injections were done after obtaining appropriate EMG activity and after negative drawback for blood. The patient tolerated the procedure well. Post procedure instructions were given. Return in about 3 months (around 01/05/2017).

## 2016-10-05 NOTE — Patient Instructions (Signed)
PLEASE FEEL FREE TO CALL OUR OFFICE WITH ANY PROBLEMS OR QUESTIONS [phone])    Paul Kane----THIS HAS TO BE A DAILY THING AS FAR AS STRETCHING IS CONCERNED!!!

## 2017-01-06 ENCOUNTER — Encounter: Payer: Self-pay | Admitting: Physical Medicine & Rehabilitation

## 2017-01-06 ENCOUNTER — Encounter
Payer: BC Managed Care – PPO | Attending: Physical Medicine & Rehabilitation | Admitting: Physical Medicine & Rehabilitation

## 2017-01-06 VITALS — BP 105/71 | HR 86 | Resp 14 | Wt 172.0 lb

## 2017-01-06 DIAGNOSIS — S069X0S Unspecified intracranial injury without loss of consciousness, sequela: Secondary | ICD-10-CM | POA: Insufficient documentation

## 2017-01-06 DIAGNOSIS — G8110 Spastic hemiplegia affecting unspecified side: Secondary | ICD-10-CM

## 2017-01-06 DIAGNOSIS — I638 Other cerebral infarction: Secondary | ICD-10-CM | POA: Diagnosis not present

## 2017-01-06 DIAGNOSIS — R531 Weakness: Secondary | ICD-10-CM | POA: Diagnosis not present

## 2017-01-06 DIAGNOSIS — I10 Essential (primary) hypertension: Secondary | ICD-10-CM | POA: Insufficient documentation

## 2017-01-06 DIAGNOSIS — G935 Compression of brain: Secondary | ICD-10-CM | POA: Diagnosis not present

## 2017-01-06 DIAGNOSIS — B181 Chronic viral hepatitis B without delta-agent: Secondary | ICD-10-CM | POA: Diagnosis not present

## 2017-01-06 DIAGNOSIS — H539 Unspecified visual disturbance: Secondary | ICD-10-CM | POA: Insufficient documentation

## 2017-01-06 DIAGNOSIS — I69354 Hemiplegia and hemiparesis following cerebral infarction affecting left non-dominant side: Secondary | ICD-10-CM | POA: Insufficient documentation

## 2017-01-06 DIAGNOSIS — R1310 Dysphagia, unspecified: Secondary | ICD-10-CM | POA: Insufficient documentation

## 2017-01-06 DIAGNOSIS — Z9689 Presence of other specified functional implants: Secondary | ICD-10-CM | POA: Diagnosis not present

## 2017-01-06 DIAGNOSIS — S062X5S 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 pre-existing conscious levels, sequela: Secondary | ICD-10-CM

## 2017-01-06 NOTE — Patient Instructions (Signed)
PLEASE FEEL FREE TO CALL OUR OFFICE WITH ANY PROBLEMS OR QUESTIONS ([phone])      

## 2017-01-06 NOTE — Progress Notes (Signed)
Subjective:    Patient ID: Paul Kane, male    DOB: 1985/09/27, 31 y.o.   MRN: [id number]  HPI   Paul Kane is here for follow up of his spastic right hemiparesis. We had discussed the potential of doing botulinum toxin injections today. He is doing well with his elbow ROM but is beginning to get tight in pronation again. He never got the knee brace I ordered at last visit in February.   He continues to be active with his family and with exercise. He is going to [place] with family next month.   Pain Inventory Average Pain 0 Pain Right Now 0 My pain is no pain  In the last 24 hours, has pain interfered with the following? General activity 0 Relation with others 0 Enjoyment of life 0 What TIME of day is your pain at its worst? no pain Sleep (in general) NA  Pain is worse with: no pain Pain improves with: no pain Relief from Meds: no pain  Mobility walk without assistance  Function disabled: date disabled .  Neuro/Psych No problems in this area  Prior Studies Any changes since last visit?  no  Physicians involved in your care Any changes since last visit?  no   Family History  Problem Relation Age of Onset  . Diabetes Unknown   . Hyperlipidemia Unknown   . Hypertension Unknown    Social History   Social History  . Marital status: Single    Spouse name: N/A  . Number of children: N/A  . Years of education: N/A   Social History Main Topics  . Smoking status: Never Smoker  . Smokeless tobacco: Never Used  . Alcohol use No  . Drug use: No  . Sexual activity: Not Asked   Other Topics Concern  . None   Social History Narrative  . None   Past Surgical History:  Procedure Laterality Date  . cramiectomy    . CRANIOPLASTY    . REMOVAL OF GASTROSTOMY TUBE    . VENTRICULOPERITONEAL SHUNT     Past Medical History:  Diagnosis Date  . 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HCC)   . Chronic hepatitis B (HCC)   . Fixed pupils   . Herniation of  brain stem (HCC)   . Hypertension   . Intracranial injury of other and unspecified nature, without mention of open intracranial wound, unspecified state of consciousness   . Intracranial shunt   . Paralysis (HCC)   . Spastic hemiplegia affecting dominant side (HCC)   . Stroke (HCC)   . Traumatic brain injury (HCC)   . Trouble swallowing   . Visual disturbance   . Weakness    BP 105/71 (BP Location: Left Arm, Patient Position: Sitting, Cuff Size: Normal)   Pulse 86   Resp 14   SpO2 95%   Opioid Risk Score:   Fall Risk Score:  `1  Depression screen PHQ 2/9  Depression screen [facility] 2/9 09/03/2015 03/27/2015 10/29/2014  Decreased Interest 1 1 1   Down, Depressed, Hopeless 1 1 1   PHQ - 2 Score 2 2 2   Altered sleeping - - 2  Tired, decreased energy - - 1  Change in appetite - - 1  Feeling bad or failure about yourself  - - 3  Trouble concentrating - - 2  Moving slowly or fidgety/restless - - 1  Suicidal thoughts - - 1  PHQ-9 Score - - 13  Some recent data might be hidden    Review of  Systems  Constitutional: Negative.   HENT: Negative.   Eyes: Negative.   Respiratory: Negative.   Cardiovascular: Negative.   Gastrointestinal: Negative.   Endocrine: Negative.   Genitourinary: Negative.   Musculoskeletal:       Spasms  Skin: Negative.   Allergic/Immunologic: Negative.   Hematological: Negative.   Psychiatric/Behavioral: Negative.   All other systems reviewed and are negative.      Objective:   Physical Exam  Constitutional: well-nourished.  HENT:  Head: Normocephalic.  Eyes: Conjunctivae and EOM are normal. Pupils are equal, round, and reactive to light.  Cardiovascular: RRR  Pulmonary/Chest: Effort normal.  Abdominal: Soft.  Neurological: He is alert. A cranial nerve deficitis present.  Dyscongugate gaze, speech severely dysarthric but better. His volume is good. Right upper extremity strength is improving with 3-4/5 strength at the shoulder and elbow.  He's 1+/5 at the wrist and fingers with flexion. Tone: Pronators are 2/4. Marland Kitchen. Biceps BR are 1/4, 2-3/4 right wrist and finger flexors again, FPL are tr to 1/4. Fingers are subluxed somewhat.    Right lower extremity is grossly 4/5 at the hip and knee. Ankle dorsiflexion and plantar flexion are 3/5-3+ out of 5. Heel cord still tight. . He has slight diminishment of his sensory function in both right arm and leg. clears leg better in gait but some recurvatum noted at knee. Cognitively, more alert and aware. communicates with short words and phrases. Speech still very dysarthric Skin: Skin is warm and intact  Assessment & Plan:  ASSESSMENT:  1. History of traumatic brain injury with hydrocephalus, meningitis,  and right pontine stroke.  2. Spastic tetraplegia, dominant side more affected 3. severe oropharyngeal dysphagia--improved--tolerating diet well 4. Absence seizures    PLAN:  1. Continue with the JAS splint---needs to continue working on supination and extension of right wrist. 2. Repeat Dysport to RUE 1000u (again to the finger/wrist flexors/thumb flexor lumbricals and pronators). In about two month in August  3. Appropriate gait technique/strategies to continue. continue efforts at [facility]              -ordered knee brace/knee cage to help control recurvatum, (sweedis knee cage) 4. 10 minutes of face to face patient care time were spent during this visit. All questions were encouraged and answered.           Assessment & Plan:

## 2017-02-08 ENCOUNTER — Ambulatory Visit: Payer: BC Managed Care – PPO | Admitting: Physical Medicine & Rehabilitation

## 2017-03-10 ENCOUNTER — Ambulatory Visit: Payer: BC Managed Care – PPO | Admitting: Physical Medicine & Rehabilitation

## 2017-03-16 ENCOUNTER — Encounter
Payer: BC Managed Care – PPO | Attending: Physical Medicine & Rehabilitation | Admitting: Physical Medicine & Rehabilitation

## 2017-03-16 ENCOUNTER — Encounter: Payer: Self-pay | Admitting: Physical Medicine & Rehabilitation

## 2017-03-16 VITALS — BP 112/67 | HR 86 | Wt 171.0 lb

## 2017-03-16 DIAGNOSIS — S069X0S Unspecified intracranial injury without loss of consciousness, sequela: Secondary | ICD-10-CM | POA: Insufficient documentation

## 2017-03-16 DIAGNOSIS — Z9689 Presence of other specified functional implants: Secondary | ICD-10-CM | POA: Insufficient documentation

## 2017-03-16 DIAGNOSIS — G8110 Spastic hemiplegia affecting unspecified side: Secondary | ICD-10-CM | POA: Diagnosis not present

## 2017-03-16 DIAGNOSIS — I10 Essential (primary) hypertension: Secondary | ICD-10-CM | POA: Insufficient documentation

## 2017-03-16 DIAGNOSIS — I638 Other cerebral infarction: Secondary | ICD-10-CM | POA: Diagnosis present

## 2017-03-16 DIAGNOSIS — G935 Compression of brain: Secondary | ICD-10-CM | POA: Insufficient documentation

## 2017-03-16 DIAGNOSIS — B181 Chronic viral hepatitis B without delta-agent: Secondary | ICD-10-CM | POA: Insufficient documentation

## 2017-03-16 DIAGNOSIS — H539 Unspecified visual disturbance: Secondary | ICD-10-CM | POA: Insufficient documentation

## 2017-03-16 DIAGNOSIS — R531 Weakness: Secondary | ICD-10-CM | POA: Diagnosis not present

## 2017-03-16 DIAGNOSIS — R1310 Dysphagia, unspecified: Secondary | ICD-10-CM | POA: Diagnosis not present

## 2017-03-16 DIAGNOSIS — I69354 Hemiplegia and hemiparesis following cerebral infarction affecting left non-dominant side: Secondary | ICD-10-CM | POA: Diagnosis not present

## 2017-03-16 NOTE — Patient Instructions (Signed)
PLEASE FEEL FREE TO CALL OUR OFFICE WITH ANY PROBLEMS OR QUESTIONS ([phone])      

## 2017-03-16 NOTE — Progress Notes (Signed)
Dysport Injection for spasticity using needle EMG guidance Indication: Spastic hemiplegia affecting dominant side (HCC)   Dilution: 250 Units/26ml        Total Units Injected: 1000 Indication: Severe spasticity which interferes with ADL,mobility and/or  hygiene and is unresponsive to medication management and other conservative care Informed consent was obtained after describing risks and benefits of the procedure with the patient. This includes bleeding, bruising, infection, excessive weakness, or medication side effects. A REMS form is on file and signed.  Needle: 15mm injectable monopolar needle electrode  Number of units per muscle Pectoralis Major 0 units Pectoralis Minor 0 units Biceps 0 units Brachioradialis 0 units FCR 25 units FCU 25 units FDS 100 units FDP 100 units FPL 0 units Pronator Teres 150 units Pronator Quadratus 200 units Lumbricals (4), 75 units each ABP 100 units  All injections were done after obtaining appropriate EMG activity and after negative drawback for blood. The patient tolerated the procedure well. Post procedure instructions were given. Return in about 2 months (around 05/16/2017).

## 2017-05-12 ENCOUNTER — Ambulatory Visit
Admission: RE | Admit: 2017-05-12 | Discharge: 2017-05-12 | Disposition: A | Discharge status: Home / Self Care | Payer: BC Managed Care – PPO | Source: Ambulatory Visit | Attending: Physical Medicine & Rehabilitation | Admitting: Physical Medicine & Rehabilitation

## 2017-05-12 ENCOUNTER — Encounter: Payer: Self-pay | Admitting: Physical Medicine & Rehabilitation

## 2017-05-12 ENCOUNTER — Encounter
Payer: BC Managed Care – PPO | Attending: Physical Medicine & Rehabilitation | Admitting: Physical Medicine & Rehabilitation

## 2017-05-12 VITALS — BP 100/68 | HR 92 | Resp 14

## 2017-05-12 DIAGNOSIS — B181 Chronic viral hepatitis B without delta-agent: Secondary | ICD-10-CM | POA: Diagnosis not present

## 2017-05-12 DIAGNOSIS — X58XXXS Exposure to other specified factors, sequela: Secondary | ICD-10-CM | POA: Insufficient documentation

## 2017-05-12 DIAGNOSIS — R569 Unspecified convulsions: Secondary | ICD-10-CM | POA: Insufficient documentation

## 2017-05-12 DIAGNOSIS — H539 Unspecified visual disturbance: Secondary | ICD-10-CM | POA: Insufficient documentation

## 2017-05-12 DIAGNOSIS — S062X5S 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 pre-existing conscious levels, sequela: Secondary | ICD-10-CM | POA: Diagnosis not present

## 2017-05-12 DIAGNOSIS — M5459 Other low back pain: Secondary | ICD-10-CM

## 2017-05-12 DIAGNOSIS — G919 Hydrocephalus, unspecified: Secondary | ICD-10-CM | POA: Insufficient documentation

## 2017-05-12 DIAGNOSIS — I10 Essential (primary) hypertension: Secondary | ICD-10-CM | POA: Diagnosis not present

## 2017-05-12 DIAGNOSIS — R1312 Dysphagia, oropharyngeal phase: Secondary | ICD-10-CM | POA: Insufficient documentation

## 2017-05-12 DIAGNOSIS — Z8661 Personal history of infections of the central nervous system: Secondary | ICD-10-CM | POA: Diagnosis not present

## 2017-05-12 DIAGNOSIS — M545 Low back pain: Secondary | ICD-10-CM | POA: Insufficient documentation

## 2017-05-12 DIAGNOSIS — R531 Weakness: Secondary | ICD-10-CM | POA: Diagnosis not present

## 2017-05-12 DIAGNOSIS — Z9689 Presence of other specified functional implants: Secondary | ICD-10-CM | POA: Insufficient documentation

## 2017-05-12 DIAGNOSIS — G8190 Hemiplegia, unspecified affecting unspecified side: Secondary | ICD-10-CM

## 2017-05-12 MED ORDER — DICLOFENAC SODIUM 75 MG PO TBEC: 75.0000 mg | DELAYED_RELEASE_TABLET | Freq: Two times a day (BID) | ORAL | 3 refills | Status: DC

## 2017-05-12 NOTE — Progress Notes (Signed)
Subjective:    Patient ID: Paul Kane E Menge, male    DOB: [birth year], 31 y.o.   MRN: [id number]  HPI Paul Kane is here in follow up of his spastic right hemiparesis. He has developed incresaed low back pain since I last saw him. It is along his belt in the midline and off to the right. It is worse with gait and sometimes when he gets up in the morning. [facility] is working on his back and mechanics and posture. He is doing some stretches at home. Mom has given him ibuprofen on occasion which has helped.   The dysport injections were quite helpful this summer and he's still enjoying muscle relaxation at this point. He has a splint which he wears to help maintain ROM  / Pain Inventory Average Pain 7 Pain Right Now 7 My pain is constant  In the last 24 hours, has pain interfered with the following? General activity 0 Relation with others 0 Enjoyment of life 0 What TIME of day is your pain at its worst? all Sleep (in general) Fair  Pain is worse with: standing and some activites Pain improves with: rest Relief from Meds: 5  Mobility walk without assistance  Function disabled: date disabled .  Neuro/Psych No problems in this area  Prior Studies Any changes since last visit?  no  Physicians involved in your care Any changes since last visit?  no   Family History  Problem Relation Age of Onset  . Diabetes Unknown   . Hyperlipidemia Unknown   . Hypertension Unknown    Social History   Social History  . Marital status: Single    Spouse name: N/A  . Number of children: N/A  . Years of education: N/A   Social History Main Topics  . Smoking status: Never Smoker  . Smokeless tobacco: Never Used  . Alcohol use No  . Drug use: No  . Sexual activity: Not Asked   Other Topics Concern  . None   Social History Narrative  . None   Past Surgical History:  Procedure Laterality Date  . cramiectomy    . CRANIOPLASTY    . REMOVAL OF GASTROSTOMY TUBE    . VENTRICULOPERITONEAL  SHUNT     Past Medical History:  Diagnosis Date  . 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HCC)   . Chronic hepatitis B (HCC)   . Fixed pupils   . 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HCC)   . Hypertension   . Intracranial injury of other and unspecified nature, without mention of open intracranial wound, unspecified state of consciousness   . Intracranial shunt   . Paralysis (HCC)   . Spastic hemiplegia affecting dominant side (HCC)   . Stroke (HCC)   . Traumatic brain injury (HCC)   . Trouble swallowing   . Visual disturbance   . Weakness    BP 100/68 (BP Location: Left Arm, Patient Position: Sitting, Cuff Size: Normal)   Pulse 92   Resp 14   SpO2 94%   Opioid Risk Score:   Fall Risk Score:  `1  Depression screen PHQ 2/9  [facility] 03/16/2017 09/03/2015 03/27/2015 10/29/2014  Decreased Interest 1 1 1 1   Down, Depressed, Hopeless 1 1 1 1   PHQ - 2 Score 2 2 2 2   Altered sleeping - - - 2  Tired, decreased energy - - - 1  Change in appetite - - - 1  Feeling bad or failure about yourself  - - - 3  Trouble concentrating - - - 2  Moving slowly or fidgety/restless - - - 1  Suicidal thoughts - - - 1  PHQ-9 Score - - - 13  Some recent data might be hidden    Review of Systems  Constitutional: Negative.   HENT: Negative.   Eyes: Negative.   Respiratory: Negative.   Cardiovascular: Negative.   Gastrointestinal: Negative.   Endocrine: Negative.   Genitourinary: Negative.   Musculoskeletal: Positive for back pain.  Skin: Negative.   Neurological: Negative.   Hematological: Negative.   Psychiatric/Behavioral: Negative.   All other systems reviewed and are negative.      Objective:   Physical Exam  Constitutional: well-nourished.  HENT:  Head: Normocephalic.  Eyes: Conjunctivae and EOM are normal. Pupils are equal, round, and reactive to light.  Cardiovascular: RRR.  Pulmonary/Chest: Effort normal.  Abdominal: Soft.  Neurological: He is alert.  A cranial nerve deficitis present.  Dyscongugate gaze, speech severely dysarthric but better. His volume is good. Right upper extremity strength is improving with 3-4/5 strength at the shoulder and elbow. He's 1+/5 at the wrist and fingers with flexion. Tone: Pronators are 2/4. Marland Kitchen Biceps BR are 1-/4, 1+/4 right wrist and finger flexors, FPL are tr/4. Fingers are subluxed somewhat.    Right lower extremity is grossly 4/5 at the hip and knee. Ankle dorsiflexion and plantar flexion are 3/5-3+ out of 5. Speech dysarthric. Cognition at baseline.  Musc: low back tender to palpation at L5-s1 midline. Also tenderness along right lumbar paraspinals and iliac crest. Worse with gait. Swings leg forward/circumducts and uses back to help move right leg forward during gait Skin: Skin is warm and intact  Assessment & Plan:  ASSESSMENT:  1. History of traumatic brain injury with hydrocephalus, meningitis,  and right pontine stroke.  2. Spastic tetraplegia, dominant side more affected 3. severe oropharyngeal dysphagia--improved--tolerating diet well 4. Absence seizures 5. Mechanical Low back pain: due to posture/altered gait patterns    PLAN:  1. Continue with the JAS splint---RUE and regular exercise and stretching especially in extension and supination. 2. Good results with dysport. No new injections indcated today.  3. Appropriate gait technique/strategies to continue. continue efforts at [facility]              -  4. Voltaren 75mg  bid. Heat, ice, stretching for low back. Xrays ordered also   15 minutes of face to face patient care time were spent during this visit. All questions were encouraged and answered. folllow up in 2 months

## 2017-05-12 NOTE — Patient Instructions (Signed)
HEAT, ICE, REGULAR STRETCHING, REST BREAKS  MOST IMPORTANTLY REFINE MECHANICS OF WALKING. GOOD POSTURE!!

## 2017-05-14 ENCOUNTER — Telehealth: Payer: Self-pay | Admitting: Physical Medicine & Rehabilitation

## 2017-05-14 NOTE — Telephone Encounter (Signed)
Called patients mother and relayed information, states patient is also doing much better since last visit

## 2017-05-14 NOTE — Telephone Encounter (Signed)
Please let Jacquel/mom know that xrays are unremarkable. thx

## 2017-07-09 ENCOUNTER — Encounter: Payer: BC Managed Care – PPO | Admitting: Physical Medicine & Rehabilitation

## 2017-08-02 ENCOUNTER — Encounter: Payer: Self-pay | Admitting: Physical Medicine & Rehabilitation

## 2017-08-02 ENCOUNTER — Encounter
Payer: BC Managed Care – PPO | Attending: Physical Medicine & Rehabilitation | Admitting: Physical Medicine & Rehabilitation

## 2017-08-02 VITALS — BP 108/71 | HR 76 | Resp 14

## 2017-08-02 DIAGNOSIS — R569 Unspecified convulsions: Secondary | ICD-10-CM | POA: Insufficient documentation

## 2017-08-02 DIAGNOSIS — Z8661 Personal history of infections of the central nervous system: Secondary | ICD-10-CM | POA: Diagnosis not present

## 2017-08-02 DIAGNOSIS — H539 Unspecified visual disturbance: Secondary | ICD-10-CM | POA: Insufficient documentation

## 2017-08-02 DIAGNOSIS — G919 Hydrocephalus, unspecified: Secondary | ICD-10-CM | POA: Diagnosis not present

## 2017-08-02 DIAGNOSIS — G8110 Spastic hemiplegia affecting unspecified side: Secondary | ICD-10-CM

## 2017-08-02 DIAGNOSIS — S062X5S 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 pre-existing conscious levels, sequela: Secondary | ICD-10-CM | POA: Insufficient documentation

## 2017-08-02 DIAGNOSIS — G8190 Hemiplegia, unspecified affecting unspecified side: Secondary | ICD-10-CM

## 2017-08-02 DIAGNOSIS — Z9689 Presence of other specified functional implants: Secondary | ICD-10-CM | POA: Diagnosis not present

## 2017-08-02 DIAGNOSIS — R1312 Dysphagia, oropharyngeal phase: Secondary | ICD-10-CM | POA: Insufficient documentation

## 2017-08-02 DIAGNOSIS — I10 Essential (primary) hypertension: Secondary | ICD-10-CM | POA: Insufficient documentation

## 2017-08-02 DIAGNOSIS — M545 Low back pain: Secondary | ICD-10-CM | POA: Diagnosis not present

## 2017-08-02 DIAGNOSIS — R531 Weakness: Secondary | ICD-10-CM | POA: Insufficient documentation

## 2017-08-02 DIAGNOSIS — B181 Chronic viral hepatitis B without delta-agent: Secondary | ICD-10-CM | POA: Diagnosis not present

## 2017-08-02 NOTE — Progress Notes (Signed)
Subjective:    Patient ID: Paul Kane, male    DOB: 01-28-1986, 32 y.o.   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  He has noticed more tightness in the right upper extremity since I last saw him.  We lasted botulinum toxins in August.  He has been participating in the [facility] and working on his Animatorgait mechanics.  He is active exercising otherwise as well.  Back pain seems to be improved as a whole.  He has had no further seizures per his parents.  Pain Inventory Average Pain 0 Pain Right Now 0 My pain is constant and tightness  In the last 24 hours, has pain interfered with the following? General activity 0 Relation with others 0 Enjoyment of life 0 What TIME of day is your pain at its worst? no pain Sleep (in general) Good  Pain is worse with: no pain Pain improves with: no pain Relief from Meds: no pain  Mobility walk without assistance  Function disabled: date disabled .  Neuro/Psych No problems in this area  Prior Studies Any changes since last visit?  no  Physicians involved in your care Any changes since last visit?  no   Family History  Problem Relation Age of Onset  . Diabetes Unknown   . Hyperlipidemia Unknown   . Hypertension Unknown    Social History   Socioeconomic History  . Marital status: Single    Spouse name: None  . Number of children: None  . Years of education: None  . Highest education level: None  Social Needs  . Financial resource strain: None  . Food insecurity - worry: None  . Food insecurity - inability: None  . Transportation needs - medical: None  . Transportation needs - non-medical: None  Occupational History  . None  Tobacco Use  . Smoking status: Never Smoker  . Smokeless tobacco: Never Used  Substance and Sexual Activity  . Alcohol use: No  . Drug use: No  . Sexual activity: None  Other Topics Concern  . None  Social History Narrative  . None     Past Surgical History:  Procedure Laterality Date  . cramiectomy    . CRANIOPLASTY    . REMOVAL OF GASTROSTOMY TUBE    . VENTRICULOPERITONEAL SHUNT     Past Medical History:  Diagnosis Date  . 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HCC)   . Chronic hepatitis B (HCC)   . Fixed pupils   . 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HCC)   . Hypertension   . Intracranial injury of other and unspecified nature, without mention of open intracranial wound, unspecified state of consciousness   . Intracranial shunt   . Paralysis (HCC)   . Spastic hemiplegia affecting dominant side (HCC)   . Stroke (HCC)   . Traumatic brain injury (HCC)   . Trouble swallowing   . Visual disturbance   . Weakness    BP 108/71 (BP Location: Left Arm, Patient Position: Sitting, Cuff Size: Normal)   Pulse 76   Resp 14   SpO2 96%   Opioid Risk Score:   Fall Risk Score:  `1  Depression screen PHQ 2/9  [facility] 03/16/2017 09/03/2015 03/27/2015 10/29/2014  Decreased Interest 1 1 1 1   Down, Depressed, Hopeless 1 1 1 1   PHQ - 2 Score 2 2 2 2   Altered sleeping - - - 2  Tired, decreased energy - - -  1  Change in appetite - - - 1  Feeling bad or failure about yourself  - - - 3  Trouble concentrating - - - 2  Moving slowly or fidgety/restless - - - 1  Suicidal thoughts - - - 1  PHQ-9 Score - - - 13  Some recent data might be hidden    Review of Systems  Constitutional: Negative.   HENT: Negative.   Eyes: Negative.   Respiratory: Negative.   Cardiovascular: Negative.   Gastrointestinal: Negative.   Endocrine: Negative.   Genitourinary: Negative.   Musculoskeletal: Negative.   Skin: Negative.   Neurological: Negative.   Hematological: Negative.   Psychiatric/Behavioral: Negative.        Objective:   Physical Exam  Constitutional: well-nourished.  HENT:  Head: Normocephalic.  Eyes: Conjunctivae and EOM are normal. Pupils are equal, round, and reactive to light.   Cardiovascular: RRR.  Pulmonary/Chest: normal effort.  Abdominal: Soft.  Neurological: He is alert. A cranial nerve deficitis present.  Dyscongugate gaze, speech severely dysarthric but intelligible with extra time.  Volume is good right upper extremity strength is improving with 3-4 out of 5 strength at the shoulder and elbow. He's 1+/5 at the wrist and fingers with flexion. Tone: Pec major, teres major 2-3/4. Marland Kitchen Biceps BR are 2-3/4, 1+ to 2/4 right wrist and finger flexors, FPL are tr/4. Fingers are subluxed somewhat. Right lower extremity is grossly 4/5 at the hip and knee. Ankle dorsiflexion and plantar flexion are 3/5-3+ out of 5. Speech dysarthric.  Musc: low back with minimal tenderness Skin: Skin is warm and intact  Assessment & Plan:  ASSESSMENT:  1. History of traumatic brain injury with hydrocephalus, meningitis,  and right pontine stroke.  2. Spastic tetraplegia, dominant side more affected 3. severe oropharyngeal dysphagia--improved--tolerating diet well.  Continues on thickened liquids 4. Absence seizures 5. Mechanical Low back pain: due to posture/altered gait patterns    PLAN:  1. Continue with the JAS splint---RUE and regular exercise and stretching especially in extension and supination. 2. Good results with dysport in past. Will arrange injections,  For right pec major, teres major, biceps, brachioradialis, finger wrist flexors.  3. Reviewed gait technique/strategies to continue. continue efforts at [facility]  4. Voltaren 75mg  bid. Continued heat/ice    15 minutes of face to face patient care time were spent during this visit. All questions were encouraged and answered. folllow up in 1 months

## 2017-08-02 NOTE — Patient Instructions (Signed)
PLEASE FEEL FREE TO CALL OUR OFFICE WITH ANY PROBLEMS OR QUESTIONS ([phone])      

## 2017-09-01 ENCOUNTER — Encounter
Payer: BC Managed Care – PPO | Attending: Physical Medicine & Rehabilitation | Admitting: Physical Medicine & Rehabilitation

## 2017-09-01 ENCOUNTER — Encounter: Payer: Self-pay | Admitting: Physical Medicine & Rehabilitation

## 2017-09-01 VITALS — BP 108/60 | HR 77

## 2017-09-01 DIAGNOSIS — G8190 Hemiplegia, unspecified affecting unspecified side: Secondary | ICD-10-CM | POA: Diagnosis present

## 2017-09-01 DIAGNOSIS — S062X5S 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 pre-existing conscious levels, sequela: Secondary | ICD-10-CM | POA: Insufficient documentation

## 2017-09-01 DIAGNOSIS — M545 Low back pain: Secondary | ICD-10-CM | POA: Diagnosis not present

## 2017-09-01 DIAGNOSIS — Z8661 Personal history of infections of the central nervous system: Secondary | ICD-10-CM | POA: Diagnosis not present

## 2017-09-01 DIAGNOSIS — H539 Unspecified visual disturbance: Secondary | ICD-10-CM | POA: Diagnosis not present

## 2017-09-01 DIAGNOSIS — B181 Chronic viral hepatitis B without delta-agent: Secondary | ICD-10-CM | POA: Insufficient documentation

## 2017-09-01 DIAGNOSIS — R569 Unspecified convulsions: Secondary | ICD-10-CM | POA: Insufficient documentation

## 2017-09-01 DIAGNOSIS — G8110 Spastic hemiplegia affecting unspecified side: Secondary | ICD-10-CM

## 2017-09-01 DIAGNOSIS — I10 Essential (primary) hypertension: Secondary | ICD-10-CM | POA: Diagnosis not present

## 2017-09-01 DIAGNOSIS — G919 Hydrocephalus, unspecified: Secondary | ICD-10-CM | POA: Insufficient documentation

## 2017-09-01 DIAGNOSIS — R531 Weakness: Secondary | ICD-10-CM | POA: Insufficient documentation

## 2017-09-01 DIAGNOSIS — Z9689 Presence of other specified functional implants: Secondary | ICD-10-CM | POA: Insufficient documentation

## 2017-09-01 DIAGNOSIS — R1312 Dysphagia, oropharyngeal phase: Secondary | ICD-10-CM | POA: Insufficient documentation

## 2017-09-01 NOTE — Patient Instructions (Signed)
PLEASE FEEL FREE TO CALL OUR OFFICE WITH ANY PROBLEMS OR QUESTIONS ([phone])      

## 2017-09-01 NOTE — Progress Notes (Signed)
Dysport Injection for spasticity using needle EMG guidance Indication:  Spastic right hemiparesis   Dilution: 500 Units/205ml        Total Units Injected:  1000 Indication: Severe spasticity which interferes with ADL,mobility and/or  hygiene and is unresponsive to medication management and other conservative care Informed consent was obtained after describing risks and benefits of the procedure with the patient. This includes bleeding, bruising, infection, excessive weakness, or medication side effects. A REMS form is on file and signed.  Needle: 50mm injectable monopolar needle electrode  Number of units per muscle Pectoralis Major 100 units Pectoralis Minor 0 units Biceps 200 units Brachioradialis 300 units FCR 50 units FCU 50 units FDS 100 units FDP 100 units FPL 0 units Pronator Teres 50 units Pronator Quadratus 50 units  All injections were done after obtaining appropriate EMG activity and after negative drawback for blood. The patient tolerated the procedure well. Post procedure instructions were given.

## 2017-10-03 ENCOUNTER — Other Ambulatory Visit: Payer: Self-pay | Admitting: Physical Medicine & Rehabilitation

## 2017-11-29 ENCOUNTER — Other Ambulatory Visit: Payer: Self-pay

## 2017-11-29 ENCOUNTER — Encounter
Payer: BC Managed Care – PPO | Attending: Physical Medicine & Rehabilitation | Admitting: Physical Medicine & Rehabilitation

## 2017-11-29 ENCOUNTER — Encounter: Payer: Self-pay | Admitting: Physical Medicine & Rehabilitation

## 2017-11-29 VITALS — BP 98/67 | HR 78 | Ht 66.0 in | Wt 167.4 lb

## 2017-11-29 DIAGNOSIS — Z9689 Presence of other specified functional implants: Secondary | ICD-10-CM | POA: Diagnosis not present

## 2017-11-29 DIAGNOSIS — R569 Unspecified convulsions: Secondary | ICD-10-CM | POA: Diagnosis not present

## 2017-11-29 DIAGNOSIS — M545 Low back pain: Secondary | ICD-10-CM | POA: Insufficient documentation

## 2017-11-29 DIAGNOSIS — Z8661 Personal history of infections of the central nervous system: Secondary | ICD-10-CM | POA: Diagnosis not present

## 2017-11-29 DIAGNOSIS — R531 Weakness: Secondary | ICD-10-CM | POA: Insufficient documentation

## 2017-11-29 DIAGNOSIS — R1312 Dysphagia, oropharyngeal phase: Secondary | ICD-10-CM | POA: Diagnosis not present

## 2017-11-29 DIAGNOSIS — I10 Essential (primary) hypertension: Secondary | ICD-10-CM | POA: Insufficient documentation

## 2017-11-29 DIAGNOSIS — G8110 Spastic hemiplegia affecting unspecified side: Secondary | ICD-10-CM

## 2017-11-29 DIAGNOSIS — H539 Unspecified visual disturbance: Secondary | ICD-10-CM | POA: Diagnosis not present

## 2017-11-29 DIAGNOSIS — G8190 Hemiplegia, unspecified affecting unspecified side: Secondary | ICD-10-CM | POA: Diagnosis present

## 2017-11-29 DIAGNOSIS — B181 Chronic viral hepatitis B without delta-agent: Secondary | ICD-10-CM | POA: Diagnosis not present

## 2017-11-29 DIAGNOSIS — G919 Hydrocephalus, unspecified: Secondary | ICD-10-CM | POA: Insufficient documentation

## 2017-11-29 DIAGNOSIS — S062X5S 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 pre-existing conscious levels, sequela: Secondary | ICD-10-CM | POA: Diagnosis not present

## 2017-11-29 NOTE — Progress Notes (Signed)
Subjective:    Patient ID: Paul Kane, male    DOB: September 02, 1985, 32 y.o.   MRN: [id number]  HPI   Paul Kane is here in follow up of his spastic right hemiparesis and functional deficits after TBI/stroke. He has been involved in therapy at [facility] as well as [place]. They have been working on his posture and gait quality, spasticity. He had good results again 3 months ago with dysport to his pecs and biceps and finger flexors, especially in elbow and chest. He is performing stretches at home. He has a JAS splint.   Pain Inventory Average Pain 0 Pain Right Now 0 My pain is no pain  In the last 24 hours, has pain interfered with the following? General activity 0 Relation with others 0 Enjoyment of life 0 What TIME of day is your pain at its worst? no pain Sleep (in general) Good  Pain is worse with: no pain Pain improves with: no pain Relief from Meds: no pain  Mobility how many minutes can you walk? 30 ability to climb steps?  yes do you drive?  no  Function I need assistance with the following:  dressing, bathing and meal prep  Neuro/Psych No problems in this area  Prior Studies Any changes since last visit?  no  Physicians involved in your care Any changes since last visit?  no   Family History  Problem Relation Age of Onset  . Diabetes Unknown   . Hyperlipidemia Unknown   . Hypertension Unknown    Social History   Socioeconomic History  . Marital status: Single    Spouse name: Not on file  . Number of children: Not on file  . Years of education: Not on file  . Highest education level: Not on file  Occupational History  . Not on file  Social Needs  . Financial resource strain: Not on file  . Food insecurity:    Worry: Not on file    Inability: Not on file  . Transportation needs:    Medical: Not on file    Non-medical: Not on file  Tobacco Use  . Smoking status: Never Smoker  . Smokeless tobacco: Never Used  Substance and Sexual Activity  . Alcohol use:  No  . Drug use: No  . Sexual activity: Not on file  Lifestyle  . Physical activity:    Days per week: Not on file    Minutes per session: Not on file  . Stress: Not on file  Relationships  . Social connections:    Talks on phone: Not on file    Gets together: Not on file    Attends religious service: Not on file    Active member of club or organization: Not on file    Attends meetings of clubs or organizations: Not on file    Relationship status: Not on file  Other Topics Concern  . Not on file  Social History Narrative  . Not on file   Past Surgical History:  Procedure Laterality Date  . cramiectomy    . CRANIOPLASTY    . REMOVAL OF GASTROSTOMY TUBE    . VENTRICULOPERITONEAL SHUNT     Past Medical History:  Diagnosis Date  . 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HCC)   . Chronic hepatitis B (HCC)   . Fixed pupils   . 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HCC)   . Hypertension   . Intracranial injury of other and unspecified nature, without mention of open intracranial wound, unspecified  state of consciousness   . Intracranial shunt   . Paralysis (HCC)   . Spastic hemiplegia affecting dominant side (HCC)   . Stroke (HCC)   . Traumatic brain injury (HCC)   . Trouble swallowing   . Visual disturbance   . Weakness    BP 98/67   Pulse 78   Ht  (1.676 m)   Wt 167 lb 6.4 oz (75.9 kg)   SpO2 95%   BMI 27.02 kg/m   Opioid Risk Score:   Fall Risk Score:  `1  Depression screen PHQ 2/9  Depression screen [facility] 2/9 11/29/2017 03/16/2017 09/03/2015 03/27/2015 10/29/2014  Decreased Interest 0 Down, Depressed, Hopeless 0 PHQ - 2 Score 0 Altered sleeping - - - - 2  Tired, decreased energy - - - - 1  Change in appetite - - - - 1  Feeling bad or failure about yourself  - - - - 3  Trouble concentrating - - - - 2  Moving slowly or fidgety/restless - - - - 1  Suicidal thoughts - - - - 1  PHQ-9 Score - - - - 13  Some recent data might be hidden     Review of Systems  Constitutional: Negative.   HENT: Negative.   Eyes: Negative.   Respiratory: Negative.   Cardiovascular: Negative.   Gastrointestinal: Negative.   Endocrine: Negative.   Genitourinary: Negative.   Musculoskeletal: Negative.   Skin: Negative.   Allergic/Immunologic: Negative.   Neurological: Negative.   Hematological: Negative.   Psychiatric/Behavioral: Negative.   All other systems reviewed and are negative.      Objective:   Physical Exam General: No acute distress HEENT: EOMI, oral membranes moist Cards: reg rate  Chest: normal effort Abdomen: Soft, NT, ND Skin: dry, intact Extremities: no edema  Dyscongugate gaze, speech severely dysarthric but intelligible with extra time.  Volume is good right upper extremity strength is improving with 3-4 out of 5 strength at the shoulder and elbow. He's 1+/5 at the wrist and fingers with flexion. Tone: Pec major, teres major 1/4. Marland Kitchen Biceps BR are 1-2/4,1+ to 2/4 right wrist and finger flexors, FPL aretr/4. Pronators 2-3/4 Fingers are subluxed somewhat. Right lower extremity is grossly 4/5 at the hip and knee. Ankle dorsiflexion and plantar flexion are 3/5-3+ out of 5.Speech dysarthric.  Musc: functional ROM Skin: Skin is warm and intact  Assessment & Plan:  ASSESSMENT:  1. History of traumatic brain injury with hydrocephalus, meningitis,  and right pontine stroke.  2. Spastic tetraplegia, dominant side more affected 3. severe oropharyngeal dysphagia--improved--tolerating diet well.  Continues on thickened liquids 4. Absence seizures 5. Mechanical Low back pain: due to posture/altered gait patterns    PLAN:  1. Continue with the JAS splint with RUE and regular exercise and stretching especially in extension and supination. 2.Good results again with dysport. Will arrange follow up injections,  For right  biceps, brachioradialis, finger wrist flexors, wrist pronators.  3. Continue to work on  gait technique/strategies  continued efforts at [employer] and [employer] are great 4.Voltaren  bid. Continued heat/ice   of face to face patient care time were spent during this visit. All questions were encouraged and answered. folllow up in 1 months for injections

## 2017-11-29 NOTE — Patient Instructions (Signed)
PLEASE FEEL FREE TO CALL OUR OFFICE WITH ANY PROBLEMS OR QUESTIONS ([phone])      

## 2018-01-03 ENCOUNTER — Encounter: Payer: BC Managed Care – PPO | Admitting: Physical Medicine & Rehabilitation

## 2018-01-05 ENCOUNTER — Encounter: Payer: Self-pay | Admitting: Physical Medicine & Rehabilitation

## 2018-01-05 ENCOUNTER — Encounter
Payer: BC Managed Care – PPO | Attending: Physical Medicine & Rehabilitation | Admitting: Physical Medicine & Rehabilitation

## 2018-01-05 VITALS — BP 102/72 | HR 75 | Resp 14 | Ht 66.0 in | Wt 165.0 lb

## 2018-01-05 DIAGNOSIS — B181 Chronic viral hepatitis B without delta-agent: Secondary | ICD-10-CM | POA: Insufficient documentation

## 2018-01-05 DIAGNOSIS — R1312 Dysphagia, oropharyngeal phase: Secondary | ICD-10-CM | POA: Insufficient documentation

## 2018-01-05 DIAGNOSIS — R569 Unspecified convulsions: Secondary | ICD-10-CM | POA: Insufficient documentation

## 2018-01-05 DIAGNOSIS — H539 Unspecified visual disturbance: Secondary | ICD-10-CM | POA: Diagnosis not present

## 2018-01-05 DIAGNOSIS — I10 Essential (primary) hypertension: Secondary | ICD-10-CM | POA: Insufficient documentation

## 2018-01-05 DIAGNOSIS — G8110 Spastic hemiplegia affecting unspecified side: Secondary | ICD-10-CM

## 2018-01-05 DIAGNOSIS — M545 Low back pain: Secondary | ICD-10-CM | POA: Insufficient documentation

## 2018-01-05 DIAGNOSIS — Z8661 Personal history of infections of the central nervous system: Secondary | ICD-10-CM | POA: Diagnosis not present

## 2018-01-05 DIAGNOSIS — R531 Weakness: Secondary | ICD-10-CM | POA: Diagnosis not present

## 2018-01-05 DIAGNOSIS — S062X5S 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 pre-existing conscious levels, sequela: Secondary | ICD-10-CM | POA: Insufficient documentation

## 2018-01-05 DIAGNOSIS — G919 Hydrocephalus, unspecified: Secondary | ICD-10-CM | POA: Diagnosis not present

## 2018-01-05 DIAGNOSIS — G8190 Hemiplegia, unspecified affecting unspecified side: Secondary | ICD-10-CM | POA: Diagnosis present

## 2018-01-05 DIAGNOSIS — Z9689 Presence of other specified functional implants: Secondary | ICD-10-CM | POA: Diagnosis not present

## 2018-01-05 NOTE — Progress Notes (Signed)
Dysport Injection for spasticity using needle EMG guidance Indication:  Spastic tetraplegia   Dilution: 500 Units/2.455ml        Total Units Injected:  1000 Indication: Severe spasticity which interferes with ADL,mobility and/or  hygiene and is unresponsive to medication management and other conservative care Informed consent was obtained after describing risks and benefits of the procedure with the patient. This includes bleeding, bruising, infection, excessive weakness, or medication side effects. A REMS form is on file and signed.  Needle: 50mm injectable monopolar needle electrode  Number of units per muscle Pectoralis Major 0 units Pectoralis Minor 0 units Biceps 400 units, 6 access points, Brachialis 100 u Brachioradialis 250 units FCR 25 units FCU 125 units FDS 25 units FDP 25 units FPL 0 units Pronator Teres 25 units Pronator Quadratus 25 units  All injections were done after obtaining appropriate EMG activity and after negative drawback for blood. The patient tolerated the procedure well. Post procedure instructions were given.

## 2018-01-05 NOTE — Patient Instructions (Signed)
PLEASE FEEL FREE TO CALL OUR OFFICE WITH ANY PROBLEMS OR QUESTIONS ([phone])      

## 2018-02-02 ENCOUNTER — Telehealth: Payer: Self-pay | Admitting: Physical Medicine & Rehabilitation

## 2018-02-02 NOTE — Telephone Encounter (Signed)
Patient's mom came to window about a referral for aquatic therapy.  I didn't see one in system.  She stated that they had spoken about it at last visit.  Please call patient when this is done.

## 2018-02-02 NOTE — Telephone Encounter (Signed)
I honestly don't recall discussing aquatic therapy referral. I did say that we were starting a program involving aquatic activities.

## 2018-02-03 NOTE — Telephone Encounter (Signed)
Left voicemail with mother to let her know what Dr. Riley KillSwartz had suggested, and if they had questions to call office.

## 2018-04-11 ENCOUNTER — Encounter: Payer: Self-pay | Admitting: Physical Medicine & Rehabilitation

## 2018-04-11 ENCOUNTER — Encounter
Payer: BC Managed Care – PPO | Attending: Physical Medicine & Rehabilitation | Admitting: Physical Medicine & Rehabilitation

## 2018-04-11 VITALS — BP 103/69 | HR 84 | Resp 14 | Ht 68.0 in | Wt 165.0 lb

## 2018-04-11 DIAGNOSIS — Z9689 Presence of other specified functional implants: Secondary | ICD-10-CM | POA: Diagnosis not present

## 2018-04-11 DIAGNOSIS — I10 Essential (primary) hypertension: Secondary | ICD-10-CM | POA: Diagnosis not present

## 2018-04-11 DIAGNOSIS — H539 Unspecified visual disturbance: Secondary | ICD-10-CM | POA: Insufficient documentation

## 2018-04-11 DIAGNOSIS — Z8661 Personal history of infections of the central nervous system: Secondary | ICD-10-CM | POA: Insufficient documentation

## 2018-04-11 DIAGNOSIS — B181 Chronic viral hepatitis B without delta-agent: Secondary | ICD-10-CM | POA: Diagnosis not present

## 2018-04-11 DIAGNOSIS — G40909 Epilepsy, unspecified, not intractable, without status epilepticus: Secondary | ICD-10-CM | POA: Diagnosis not present

## 2018-04-11 DIAGNOSIS — G8110 Spastic hemiplegia affecting unspecified side: Secondary | ICD-10-CM | POA: Diagnosis not present

## 2018-04-11 DIAGNOSIS — R569 Unspecified convulsions: Secondary | ICD-10-CM | POA: Insufficient documentation

## 2018-04-11 DIAGNOSIS — G919 Hydrocephalus, unspecified: Secondary | ICD-10-CM | POA: Diagnosis not present

## 2018-04-11 DIAGNOSIS — R531 Weakness: Secondary | ICD-10-CM | POA: Diagnosis not present

## 2018-04-11 DIAGNOSIS — S062X5S 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 pre-existing conscious levels, sequela: Secondary | ICD-10-CM | POA: Insufficient documentation

## 2018-04-11 DIAGNOSIS — M545 Low back pain: Secondary | ICD-10-CM | POA: Insufficient documentation

## 2018-04-11 DIAGNOSIS — R1312 Dysphagia, oropharyngeal phase: Secondary | ICD-10-CM | POA: Insufficient documentation

## 2018-04-11 DIAGNOSIS — G8190 Hemiplegia, unspecified affecting unspecified side: Secondary | ICD-10-CM

## 2018-04-11 MED ORDER — LACOSAMIDE 10 MG/ML PO SOLN: 200.0000 mg | Freq: Two times a day (BID) | ORAL | 6 refills | Status: DC

## 2018-04-11 MED ORDER — VIMPAT 10 MG/ML PO SOLN: 100.0000 mg | Freq: Two times a day (BID) | ORAL | 5 refills | Status: DC

## 2018-04-11 NOTE — Progress Notes (Signed)
Subjective:    Patient ID: Paul Kane, male    DOB: October 26, 1985, 32 y.o.   MRN: [id number]  HPI   Paul Kane is here in follow-up of his spastic right hemiparesis and traumatic brain injury.'s he continues to have good results with the Dysport injections.  He does notice over the last 2 to 3 weeks that the effects have begun to subside.  Admittedly he has not been as regular with his stretching program as he should.  He is not stretching every day but they stretch most days at home.  He is more active outside during the warm weather months and does a lot of walking.  Mom has noted that his foot still drops on the right side.  He has not fallen.  Mom notes he is doing a lot more around the house from the standpoint of chores and assistance.  She tells me that he may be coming off of his Keppra but that he remains on Vimpat for seizure prophylaxis.  His prescription ran out this morning and she needs refills on this.  He has had no further seizure activity   Pain Inventory Average Pain 4 Pain Right Now 4 My pain is constant  In the last 24 hours, has pain interfered with the following? General activity 4 Relation with others 6 Enjoyment of life 6 What TIME of day is your pain at its worst? all Sleep (in general) Fair  Pain is worse with: unsure Pain improves with: therapy/exercise Relief from Meds: 0  Mobility walk with assistance ability to climb steps?  yes do you drive?  no  Function disabled: date disabled .  Neuro/Psych No problems in this area  Prior Studies Any changes since last visit?  no  Physicians involved in your care Any changes since last visit?  no   Family History  Problem Relation Age of Onset  . Diabetes Unknown   . Hyperlipidemia Unknown   . Hypertension Unknown    Social History   Socioeconomic History  . Marital status: Single    Spouse name: Not on file  . Number of children: Not on file  . Years of education: Not on file  . Highest  education level: Not on file  Occupational History  . Not on file  Social Needs  . Financial resource strain: Not on file  . Food insecurity:    Worry: Not on file    Inability: Not on file  . Transportation needs:    Medical: Not on file    Non-medical: Not on file  Tobacco Use  . Smoking status: Never Smoker  . Smokeless tobacco: Never Used  Substance and Sexual Activity  . Alcohol use: No  . Drug use: No  . Sexual activity: Not on file  Lifestyle  . Physical activity:    Days per week: Not on file    Minutes per session: Not on file  . Stress: Not on file  Relationships  . Social connections:    Talks on phone: Not on file    Gets together: Not on file    Attends religious service: Not on file    Active member of club or organization: Not on file    Attends meetings of clubs or organizations: Not on file    Relationship status: Not on file  Other Topics Concern  . Not on file  Social History Narrative  . Not on file   Past Surgical History:  Procedure Laterality Date  . cramiectomy    .  CRANIOPLASTY    . REMOVAL OF GASTROSTOMY TUBE    . VENTRICULOPERITONEAL SHUNT     Past Medical History:  Diagnosis Date  . 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HCC)   . Chronic hepatitis B (HCC)   . Fixed pupils   . 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HCC)   . Hypertension   . Intracranial injury of other and unspecified nature, without mention of open intracranial wound, unspecified state of consciousness   . Intracranial shunt   . Paralysis (HCC)   . Spastic hemiplegia affecting dominant side (HCC)   . Stroke (HCC)   . Traumatic brain injury (HCC)   . Trouble swallowing   . Visual disturbance   . Weakness    BP 103/69 (BP Location: Left Arm, Patient Position: Sitting, Cuff Size: Normal)   Pulse 84   Resp 14   Ht 5\' 8"  (1.727 m)   Wt 165 lb (74.8 kg)   SpO2 96%   BMI 25.09 kg/m   Opioid Risk Score:   Fall Risk Score:  `1  Depression screen PHQ  2/9  Depression screen [facility] 2/9 11/29/2017 03/16/2017 09/03/2015 03/27/2015 10/29/2014  Decreased Interest 0 1 1 1 1   Down, Depressed, Hopeless 0 1 1 1 1   PHQ - 2 Score 0 2 2 2 2   Altered sleeping - - - - 2  Tired, decreased energy - - - - 1  Change in appetite - - - - 1  Feeling bad or failure about yourself  - - - - 3  Trouble concentrating - - - - 2  Moving slowly or fidgety/restless - - - - 1  Suicidal thoughts - - - - 1  PHQ-9 Score - - - - 13  Some recent data might be hidden    Review of Systems  Constitutional: Negative.   HENT: Negative.   Eyes: Negative.   Respiratory: Negative.   Cardiovascular: Negative.   Gastrointestinal: Negative.   Endocrine: Negative.   Genitourinary: Negative.   Musculoskeletal: Negative.   Skin: Negative.   Allergic/Immunologic: Negative.   Neurological: Negative.   Hematological: Negative.   Psychiatric/Behavioral: Negative.   All other systems reviewed and are negative.      Objective:   Physical Exam  General: No acute distress HEENT: EOMI, oral membranes moist Cards: reg rate  Chest: normal effort Abdomen: Soft, NT, ND Skin: dry, intact Extremities: no edema Dysarthric. Tone 2-3/4 bicep and right wrist and finger flexors. He's 1+/5 at the wrist and fingers with flexion. Tone: Pec major, teres majortr/4. Pronators 2-3/4 Fingers are subluxed still. Right lower extremity is grossly 4/5 at the hip and knee. Ankle dorsiflexion and plantar flexion are 3+/5-. gait improved. Still genu recurvatum.  Musc:functional ROM Skin: Skin is warm and intact  Assessment & Plan:  ASSESSMENT:  1. History of traumatic brain injury with hydrocephalus, meningitis,  and right pontine stroke.  2. Spastic tetraplegia, dominant side more affected 3. severe oropharyngeal dysphagia--improved--tolerating diet well.Continues on thickened liquids 4. Absence seizures 5. Mechanical Low back pain: due to posture/altered gait  patterns    PLAN:  1.  Continue range of motion exercises at home.  Describe the importance of performing these on a daily basis given his persistent tone in the right upper extremity.  It is not as important for his leg as his leg is very loose and his gait has improved there.  May benefit from a knee cage to help with recurvatum but no need at the moment. 2.  In 6  weeks we will inject elbow and wrist flexors once again with 2000 units of Dysport.  3. Refilled Vimpat today.  Urged mother to follow-up with [facility] regarding further needs of medications 4.Voltaren 75mg  as needed  of face to face patient care time were spent during this visit. All questions were encouraged and answered. folllow up in6 weeks for Dysport injections.  Greater than 50% of the time today was spent in consultation regarding gait analysis and seizure prevention.

## 2018-04-11 NOTE — Patient Instructions (Signed)
PLEASE FEEL FREE TO CALL OUR OFFICE WITH ANY PROBLEMS OR QUESTIONS ([phone])      

## 2018-04-11 NOTE — Addendum Note (Signed)
Addended by: Faith RogueSWARTZ, Aasia Peavler T on: 04/11/2018 12:03 PM   Modules accepted: Orders

## 2018-04-25 DIAGNOSIS — E782 Mixed hyperlipidemia: Secondary | ICD-10-CM | POA: Insufficient documentation

## 2018-05-23 ENCOUNTER — Encounter: Payer: BC Managed Care – PPO | Admitting: Physical Medicine & Rehabilitation

## 2018-05-25 ENCOUNTER — Encounter: Payer: Self-pay | Admitting: Physical Medicine & Rehabilitation

## 2018-05-25 ENCOUNTER — Encounter
Payer: BC Managed Care – PPO | Attending: Physical Medicine & Rehabilitation | Admitting: Physical Medicine & Rehabilitation

## 2018-05-25 VITALS — BP 102/69 | HR 70 | Resp 14 | Ht 68.0 in | Wt 165.0 lb

## 2018-05-25 DIAGNOSIS — R531 Weakness: Secondary | ICD-10-CM | POA: Insufficient documentation

## 2018-05-25 DIAGNOSIS — G8110 Spastic hemiplegia affecting unspecified side: Secondary | ICD-10-CM

## 2018-05-25 DIAGNOSIS — B181 Chronic viral hepatitis B without delta-agent: Secondary | ICD-10-CM | POA: Insufficient documentation

## 2018-05-25 DIAGNOSIS — Z9689 Presence of other specified functional implants: Secondary | ICD-10-CM | POA: Insufficient documentation

## 2018-05-25 DIAGNOSIS — R569 Unspecified convulsions: Secondary | ICD-10-CM | POA: Insufficient documentation

## 2018-05-25 DIAGNOSIS — M545 Low back pain: Secondary | ICD-10-CM | POA: Diagnosis not present

## 2018-05-25 DIAGNOSIS — G919 Hydrocephalus, unspecified: Secondary | ICD-10-CM | POA: Diagnosis not present

## 2018-05-25 DIAGNOSIS — S062X5S 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 pre-existing conscious levels, sequela: Secondary | ICD-10-CM | POA: Diagnosis not present

## 2018-05-25 DIAGNOSIS — H539 Unspecified visual disturbance: Secondary | ICD-10-CM | POA: Diagnosis not present

## 2018-05-25 DIAGNOSIS — I10 Essential (primary) hypertension: Secondary | ICD-10-CM | POA: Diagnosis not present

## 2018-05-25 DIAGNOSIS — Z8661 Personal history of infections of the central nervous system: Secondary | ICD-10-CM | POA: Diagnosis not present

## 2018-05-25 DIAGNOSIS — G8190 Hemiplegia, unspecified affecting unspecified side: Secondary | ICD-10-CM | POA: Diagnosis present

## 2018-05-25 DIAGNOSIS — R1312 Dysphagia, oropharyngeal phase: Secondary | ICD-10-CM | POA: Insufficient documentation

## 2018-05-25 NOTE — Progress Notes (Signed)
Dysport Injection for spasticity using needle EMG guidance Indication:  Spastic right hemiparesis    Dilution: 500 Units/2.72ml        Total Units Injected:  1000u Indication: Severe spasticity which interferes with ADL,mobility and/or  hygiene and is unresponsive to medication management and other conservative care Informed consent was obtained after describing risks and benefits of the procedure with the patient. This includes bleeding, bruising, infection, excessive weakness, or medication side effects. A REMS form is on file and signed.  Needle: 50mm injectable monopolar needle electrode  Number of units per muscle Pectoralis Major 0 units Pectoralis Minor 0 units Biceps 300 units Brachioradialis 200 units FCR 25 units FCU 25 units FDS 175 units FDP 175 units FPL 0 units Pronator Teres 100 units Pronator Quadratus 0 units  All injections were done after obtaining appropriate EMG activity and after negative drawback for blood. The patient tolerated the procedure well. Post procedure instructions were given.  Follow up in 3 months for potential repeat injections

## 2018-05-25 NOTE — Patient Instructions (Signed)
PLEASE FEEL FREE TO CALL OUR OFFICE WITH ANY PROBLEMS OR QUESTIONS ([phone])      

## 2018-08-30 ENCOUNTER — Encounter
Payer: BC Managed Care – PPO | Attending: Physical Medicine & Rehabilitation | Admitting: Physical Medicine & Rehabilitation

## 2018-08-30 ENCOUNTER — Encounter: Payer: Self-pay | Admitting: Physical Medicine & Rehabilitation

## 2018-08-30 ENCOUNTER — Telehealth: Payer: Self-pay | Admitting: Physical Medicine & Rehabilitation

## 2018-08-30 VITALS — BP 105/68 | HR 79 | Resp 14 | Ht 68.0 in | Wt 166.0 lb

## 2018-08-30 DIAGNOSIS — Z9689 Presence of other specified functional implants: Secondary | ICD-10-CM | POA: Insufficient documentation

## 2018-08-30 DIAGNOSIS — B181 Chronic viral hepatitis B without delta-agent: Secondary | ICD-10-CM | POA: Insufficient documentation

## 2018-08-30 DIAGNOSIS — S062X5S 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 pre-existing conscious levels, sequela: Secondary | ICD-10-CM | POA: Diagnosis not present

## 2018-08-30 DIAGNOSIS — G919 Hydrocephalus, unspecified: Secondary | ICD-10-CM | POA: Insufficient documentation

## 2018-08-30 DIAGNOSIS — R569 Unspecified convulsions: Secondary | ICD-10-CM | POA: Diagnosis not present

## 2018-08-30 DIAGNOSIS — R1312 Dysphagia, oropharyngeal phase: Secondary | ICD-10-CM | POA: Diagnosis not present

## 2018-08-30 DIAGNOSIS — G8190 Hemiplegia, unspecified affecting unspecified side: Secondary | ICD-10-CM

## 2018-08-30 DIAGNOSIS — H539 Unspecified visual disturbance: Secondary | ICD-10-CM | POA: Diagnosis not present

## 2018-08-30 DIAGNOSIS — R531 Weakness: Secondary | ICD-10-CM | POA: Diagnosis not present

## 2018-08-30 DIAGNOSIS — G8110 Spastic hemiplegia affecting unspecified side: Secondary | ICD-10-CM | POA: Diagnosis not present

## 2018-08-30 DIAGNOSIS — M545 Low back pain: Secondary | ICD-10-CM | POA: Insufficient documentation

## 2018-08-30 DIAGNOSIS — I10 Essential (primary) hypertension: Secondary | ICD-10-CM | POA: Insufficient documentation

## 2018-08-30 DIAGNOSIS — Z8661 Personal history of infections of the central nervous system: Secondary | ICD-10-CM | POA: Diagnosis not present

## 2018-08-30 NOTE — Progress Notes (Signed)
Dysport Injection for spasticity using needle EMG guidance Indication:  Spastic right hemiparesis   Dilution: 500 Units/2.54ml        Total Units Injected:  1000 Indication: Severe spasticity which interferes with ADL,mobility and/or  hygiene and is unresponsive to medication management and other conservative care Informed consent was obtained after describing risks and benefits of the procedure with the patient. This includes bleeding, bruising, infection, excessive weakness, or medication side effects. A REMS form is on file and signed.  Needle: 75mm injectable monopolar needle electrode  Number of units per muscle Pectoralis Major 0 units Pectoralis Minor 0 units Biceps 150 units Brachioradialis 100 units FCR 100 units FCU 100 units FDS 150 units FDP 150 units FPL 100 units Pronator Teres 100 units FPB/ABP 50 units  All injections were done after obtaining appropriate EMG activity and after negative drawback for blood. The patient tolerated the procedure well. Post procedure instructions were given.

## 2018-08-30 NOTE — Patient Instructions (Signed)
PLEASE FEEL FREE TO CALL OUR OFFICE WITH ANY PROBLEMS OR QUESTIONS ([phone])      

## 2018-10-11 ENCOUNTER — Other Ambulatory Visit: Payer: Self-pay | Admitting: Physical Medicine & Rehabilitation

## 2018-10-26 ENCOUNTER — Other Ambulatory Visit: Payer: Self-pay | Admitting: Physical Medicine & Rehabilitation

## 2018-11-29 ENCOUNTER — Ambulatory Visit: Payer: BC Managed Care – PPO | Admitting: Physical Medicine & Rehabilitation

## 2018-11-30 ENCOUNTER — Ambulatory Visit: Payer: BC Managed Care – PPO | Admitting: Physical Medicine & Rehabilitation

## 2019-01-31 ENCOUNTER — Encounter: Payer: BC Managed Care – PPO | Admitting: Physical Medicine & Rehabilitation

## 2019-03-28 ENCOUNTER — Telehealth: Payer: Self-pay | Admitting: *Deleted

## 2019-03-28 NOTE — Telephone Encounter (Signed)
Paul Kane is on your schedule for 04/05/19 as a follow up. You gave him dysport in February and message to appointment desk was to follow up in 3 months. It did not say for dysport.  Will this be a follow up or dysport appointment?

## 2019-03-28 NOTE — Telephone Encounter (Signed)
I could not reach Paul Kane's mother.  I spoke with his father and they are going to talk about it and let us know because they canceled the last visit over fear of the covid-19, and they are still worried because he is high risk.Marland Kitchen

## 2019-03-28 NOTE — Telephone Encounter (Signed)
I think it would be best to call mom and ask if spasticity has increased and if he needs dysport If so we can set up for injections.   I had not intended on repeating dysport at the 3 month follow up  Thanks.

## 2019-04-05 ENCOUNTER — Encounter: Payer: BC Managed Care – PPO | Admitting: Physical Medicine & Rehabilitation

## 2019-06-07 ENCOUNTER — Encounter: Payer: Self-pay | Admitting: Physical Medicine & Rehabilitation

## 2019-06-07 ENCOUNTER — Encounter
Payer: BC Managed Care – PPO | Attending: Physical Medicine & Rehabilitation | Admitting: Physical Medicine & Rehabilitation

## 2019-06-07 ENCOUNTER — Other Ambulatory Visit: Payer: Self-pay

## 2019-06-07 VITALS — BP 97/64 | HR 79 | Temp 97.9°F | Ht 68.0 in | Wt 160.8 lb

## 2019-06-07 DIAGNOSIS — S062X5S 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 pre-existing conscious levels, sequela: Secondary | ICD-10-CM | POA: Diagnosis not present

## 2019-06-07 DIAGNOSIS — G8110 Spastic hemiplegia affecting unspecified side: Secondary | ICD-10-CM | POA: Diagnosis not present

## 2019-06-07 NOTE — Patient Instructions (Signed)
PLEASE FEEL FREE TO CALL OUR OFFICE WITH ANY PROBLEMS OR QUESTIONS ([phone])      

## 2019-06-07 NOTE — Progress Notes (Signed)
Subjective:    Patient ID: Paul Kane, male    DOB: [birth year], 33 y.o.   MRN: [id number]  HPI   Paul Kane is here in follow up of his TBI. He was [facility] for pain in his abdomen. It was caused by his IVCF which was apparently pressing up against the duodenum.  This was removed on 05/23/2019.  Since then he is feeling much better.  It has been a while since I have seen him in person due to Covid.  He has tried to remain active with his home stretching program.  Parents are active with him as well.  He has ongoing tightness in the right shoulder and particularly in the wrist and finger flexors.  He denies pain currently.  Pain Inventory Average Pain 0 Pain Right Now 0 My pain is na  In the last 24 hours, has pain interfered with the following? General activity 0 Relation with others 0 Enjoyment of life 0 What TIME of day is your pain at its worst? na Sleep (in general) Good  Pain is worse with: na Pain improves with: na Relief from Meds: na  Mobility ability to climb steps?  no do you drive?  no  Function disabled: date disabled na I need assistance with the following:  feeding, toileting, meal prep and shopping  Neuro/Psych trouble walking  Prior Studies Any changes since last visit?  no  Physicians involved in your care Any changes since last visit?  no   Family History  Problem Relation Age of Onset  . Diabetes Other   . Hyperlipidemia Other   . Hypertension Other    Social History   Socioeconomic History  . Marital status: Single    Spouse name: Not on file  . Number of children: Not on file  . Years of education: Not on file  . Highest education level: Not on file  Occupational History  . Not on file  Social Needs  . Financial resource strain: Not on file  . Food insecurity    Worry: Not on file    Inability: Not on file  . Transportation needs    Medical: Not on file    Non-medical: Not on file  Tobacco Use  . Smoking status: Never Smoker  .  Smokeless tobacco: Never Used  Substance and Sexual Activity  . Alcohol use: No  . Drug use: No  . Sexual activity: Not on file  Lifestyle  . Physical activity    Days per week: Not on file    Minutes per session: Not on file  . Stress: Not on file  Relationships  . Social Musician on phone: Not on file    Gets together: Not on file    Attends religious service: Not on file    Active member of club or organization: Not on file    Attends meetings of clubs or organizations: Not on file    Relationship status: Not on file  Other Topics Concern  . Not on file  Social History Narrative  . Not on file   Past Surgical History:  Procedure Laterality Date  . cramiectomy    . CRANIOPLASTY    . REMOVAL OF GASTROSTOMY TUBE    . VENTRICULOPERITONEAL SHUNT     Past Medical History:  Diagnosis Date  . 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HCC)   . Chronic hepatitis B (HCC)   . Fixed pupils   . 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HCC)   .  Hypertension   . Intracranial injury of other and unspecified nature, without mention of open intracranial wound, unspecified state of consciousness   . Intracranial shunt   . Paralysis ([place])   . Spastic hemiplegia affecting dominant side ([place])   . Stroke ([place])   . Traumatic brain injury ([place])   . Trouble swallowing   . Visual disturbance   . Weakness    BP 97/64   Pulse 79   Temp 97.9 F (36.6 C)   Ht 5\' 8"  (1.727 m)   Wt 160 lb 12.8 oz (72.9 kg)   SpO2 91%   BMI 24.45 kg/m   Opioid Risk Score:   Fall Risk Score:  `1  Depression screen PHQ 2/9  Depression screen [facility] 2/9 11/29/2017 03/16/2017 09/03/2015 03/27/2015 10/29/2014  Decreased Interest 0 1 1 1 1   Down, Depressed, Hopeless 0 1 1 1 1   PHQ - 2 Score 0 2 2 2 2   Altered sleeping - - - - 2  Tired, decreased energy - - - - 1  Change in appetite - - - - 1  Feeling bad or failure about yourself  - - - - 3  Trouble concentrating - - - - 2  Moving slowly or fidgety/restless  - - - - 1  Suicidal thoughts - - - - 1  PHQ-9 Score - - - - 13  Some recent data might be hidden    Review of Systems  Musculoskeletal: Positive for gait problem.  All other systems reviewed and are negative.      Objective:   Physical Exam  General: No acute distress HEENT: EOMI, oral membranes moist Cards: reg rate  Chest: normal effort Abdomen: Soft, NT, ND Skin: dry, intact Extremities: no edema    Dysarthric. Tone 2-3/4 bicep and right wrist and finger flexors. He remains 1+/5 at the wrist and fingers with flexion. Tone: Pec major, teres major1/4. Finger wrist flexors 2/4.Pronators 2-3/4Fingers are subluxed into extension at DIP.  1 out of 4 tone in the plantar flexors of the right foot.  Is easily able to reduce the foot to neutral.Right lower extremity is grossly 4/5 at the hip and knee.  Gait stable.  Ongoing genu recurvatum.  Musc:functional ROM Skin: Skin is warm and intact  Assessment & Plan:  ASSESSMENT:  1. History of traumatic brain injury with hydrocephalus, meningitis,  and right pontine stroke.  2. Spastic tetraplegia, dominant side more affected 3. severe oropharyngeal dysphagia--improved--tolerating diet well.Continues on thickened liquids 4. Absence seizures 5. Mechanical Low back pain: due to posture/altered gait patterns    PLAN:  1.  Continue with HEP.  2.  In about 2 mos we will inject finger/wrist flexors, ?pecs  again with 1000 units of Dysport. 3. Vimpat and keppra for seizure 4.Voltaren 75mg  as needed for pain.  He is using this rarely if at all  Fifteen minutes of face to face patient care time were spent during this visit. All questions were encouraged and answered.  Follow up with me in 2 mos .

## 2019-08-09 ENCOUNTER — Encounter
Payer: BC Managed Care – PPO | Attending: Physical Medicine & Rehabilitation | Admitting: Physical Medicine & Rehabilitation

## 2019-08-09 ENCOUNTER — Encounter: Payer: Self-pay | Admitting: Physical Medicine & Rehabilitation

## 2019-08-09 ENCOUNTER — Other Ambulatory Visit: Payer: Self-pay

## 2019-08-09 VITALS — BP 101/66 | HR 74 | Temp 97.6°F | Ht 68.0 in | Wt 161.6 lb

## 2019-08-09 DIAGNOSIS — G8110 Spastic hemiplegia affecting unspecified side: Secondary | ICD-10-CM | POA: Diagnosis present

## 2019-08-09 DIAGNOSIS — S062X5S 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 pre-existing conscious levels, sequela: Secondary | ICD-10-CM | POA: Diagnosis present

## 2019-08-09 NOTE — Patient Instructions (Signed)
COVID-19 Vaccine Information can be found at: [email or web address] For questions related to vaccine distribution or appointments, please email [email or web address] or call [phone].    

## 2019-08-09 NOTE — Progress Notes (Signed)
Dysport Injection for spasticity using needle EMG guidance Indication:  Spastic hemiplegia affecting dominant side (HCC) G81.10   Dilution: 500 Units/59ml        Total Units Injected:  1000 Indication: Severe spasticity which interferes with ADL,mobility and/or  hygiene and is unresponsive to medication management and other conservative care Informed consent was obtained after describing risks and benefits of the procedure with the patient. This includes bleeding, bruising, infection, excessive weakness, or medication side effects. A REMS form is on file and signed.  Right upper Needle: 37mm injectable monopolar needle electrode  Number of units per muscle Pectoralis Major 0 units Pectoralis Minor 0 units Biceps 225 units Brachioradialis 200 units FCR 50 units FCU 50 units FDS 50 units FDP 50 units FPL 25 units Pronator Teres 150 units Pronator Quadratus 50 units Lumbricals 150 units divided between 4  All injections were done after obtaining appropriate EMG activity and after negative drawback for blood. The patient tolerated the procedure well. Post procedure instructions were given.

## 2019-11-08 ENCOUNTER — Encounter
Payer: BC Managed Care – PPO | Attending: Physical Medicine & Rehabilitation | Admitting: Physical Medicine & Rehabilitation

## 2019-11-08 ENCOUNTER — Encounter: Payer: Self-pay | Admitting: Physical Medicine & Rehabilitation

## 2019-11-08 VITALS — BP 104/64 | HR 70 | Temp 98.1°F | Ht 68.0 in | Wt 165.0 lb

## 2019-11-08 DIAGNOSIS — S062X5S 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 pre-existing conscious levels, sequela: Secondary | ICD-10-CM | POA: Insufficient documentation

## 2019-11-08 DIAGNOSIS — G8110 Spastic hemiplegia affecting unspecified side: Secondary | ICD-10-CM | POA: Insufficient documentation

## 2019-11-08 NOTE — Patient Instructions (Signed)
PLEASE FEEL FREE TO CALL OUR OFFICE WITH ANY PROBLEMS OR QUESTIONS ([phone])      

## 2019-11-08 NOTE — Progress Notes (Signed)
Subjective:    Patient ID: Paul Kane, male    DOB: [birth year], 34 y.o.   MRN: [id number]  HPI   Paul Kane is here in follow up of his TBI/CVA and spastic tetraplegia. He has resumed therapy at [facility] to focus on gait and ambulation.   He's not having pain. Family works on regular ROM and exercises at home. He hasn't been to the [facility] for some time d/t COVID.   Mom reports no seizures or new medical issues. He remains on keppra and vimpat.      Pain Inventory Average Pain 0 Pain Right Now 0 My pain is na  In the last 24 hours, has pain interfered with the following? General activity 0 Relation with others 0 Enjoyment of life 0 What TIME of day is your pain at its worst? na Sleep (in general) Good  Pain is worse with: na Pain improves with: na Relief from Meds: na  Mobility ability to climb steps?  no do you drive?  no  Function disabled: date disabled . I need assistance with the following:  feeding, toileting, meal prep and shopping  Neuro/Psych trouble walking  Prior Studies Any changes since last visit?  yes COVID vaccine  Physicians involved in your care Any changes since last visit?  no   Family History  Problem Relation Age of Onset  . Diabetes Other   . Hyperlipidemia Other   . Hypertension Other    Social History   Socioeconomic History  . Marital status: Single    Spouse name: Not on file  . Number of children: Not on file  . Years of education: Not on file  . Highest education level: Not on file  Occupational History  . Not on file  Tobacco Use  . Smoking status: Never Smoker  . Smokeless tobacco: Never Used  Substance and Sexual Activity  . Alcohol use: No  . Drug use: No  . Sexual activity: Not on file  Other Topics Concern  . Not on file  Social History Narrative  . Not on file   Social Determinants of Health   Financial Resource Strain:   . Difficulty of Paying Living Expenses:   Food Insecurity:   . Worried About Community education officer in the Last Year:   . Barista in the Last Year:   Transportation Needs:   . Freight forwarder (Medical):   Marland Kitchen Lack of Transportation (Non-Medical):   Physical Activity:   . Days of Exercise per Week:   . Minutes of Exercise per Session:   Stress:   . Feeling of Stress :   Social Connections:   . Frequency of Communication with Friends and Family:   . Frequency of Social Gatherings with Friends and Family:   . Attends Religious Services:   . Active Member of Clubs or Organizations:   . Attends Banker Meetings:   Marland Kitchen Marital Status:    Past Surgical History:  Procedure Laterality Date  . cramiectomy    . CRANIOPLASTY    . REMOVAL OF GASTROSTOMY TUBE    . VENTRICULOPERITONEAL SHUNT     Past Medical History:  Diagnosis Date  . 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HCC)   . Chronic hepatitis B (HCC)   . Fixed pupils   . 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HCC)   . Hypertension   . Intracranial injury of other and unspecified nature, without mention of open intracranial wound, unspecified state of consciousness   .  Intracranial shunt   . Paralysis ([place])   . Spastic hemiplegia affecting dominant side ([place])   . Stroke ([place])   . Traumatic brain injury ([place])   . Trouble swallowing   . Visual disturbance   . Weakness    BP 104/64   Pulse 70   Temp 98.1 F (36.7 C)   Ht 5\' 8"  (1.727 m)   Wt 165 lb (74.8 kg)   SpO2 94%   BMI 25.09 kg/m   Opioid Risk Score:   Fall Risk Score:  `1  Depression screen PHQ 2/9  Depression screen [facility] 2/9 11/29/2017 03/16/2017 09/03/2015 03/27/2015 10/29/2014  Decreased Interest 0 1 1 1 1   Down, Depressed, Hopeless 0 1 1 1 1   PHQ - 2 Score 0 2 2 2 2   Altered sleeping - - - - 2  Tired, decreased energy - - - - 1  Change in appetite - - - - 1  Feeling bad or failure about yourself  - - - - 3  Trouble concentrating - - - - 2  Moving slowly or fidgety/restless - - - - 1  Suicidal thoughts - - - - 1  PHQ-9 Score  - - - - 13  Some recent data might be hidden    Review of Systems  Musculoskeletal: Positive for gait problem.  Neurological: Positive for weakness.  All other systems reviewed and are negative.      Objective:   Physical Exam  General: No acute distress HEENT: EOMI, oral membranes moist Cards: reg rate  Chest: normal effort Abdomen: Soft, NT, ND Skin: dry, intact Extremities: no edema  Dysarthric. Better elbow rom. Wrist in neutral extension. Pronators 3/4.  1/4 tone in the plantar flexors of the right foot.  Right lower extremity is grossly 4/5 at the hip and knee.  gait wide based, mild recurvatum right knee.  Musc: functional ROM Skin: Skin is warm and intact    Assessment & Plan:     ASSESSMENT:   1. History of traumatic brain injury with hydrocephalus, meningitis,   and right pontine stroke.   2. Spastic tetraplegia, dominant side more affected 3. severe oropharyngeal dysphagia--improved--tolerating diet well.  Continues on thickened liquids 4. Absence seizures 5. Mechanical Low back pain: due to posture/altered gait patterns       PLAN:   1.  Continue with HEP.  2.  bring back next month for 300u botox right pronator teres and pronator quadratus.  3.  Vimpat and keppra for seizure  4. tylenol or voltaren for pain    10 minutes of face to face patient care time were spent during this visit. All questions were encouraged and answered.  Follow up with me in 1 mos for botox .

## 2019-11-23 NOTE — Telephone Encounter (Signed)
Opened in error

## 2020-01-03 ENCOUNTER — Encounter: Payer: Self-pay | Admitting: Physical Medicine & Rehabilitation

## 2020-01-03 ENCOUNTER — Encounter
Payer: BC Managed Care – PPO | Attending: Physical Medicine & Rehabilitation | Admitting: Physical Medicine & Rehabilitation

## 2020-01-03 ENCOUNTER — Other Ambulatory Visit: Payer: Self-pay

## 2020-01-03 VITALS — BP 103/67 | HR 77 | Temp 97.7°F | Ht 68.0 in | Wt 169.2 lb

## 2020-01-03 DIAGNOSIS — G8110 Spastic hemiplegia affecting unspecified side: Secondary | ICD-10-CM | POA: Diagnosis present

## 2020-01-03 DIAGNOSIS — S062X5S 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 pre-existing conscious levels, sequela: Secondary | ICD-10-CM | POA: Diagnosis present

## 2020-01-03 NOTE — Patient Instructions (Signed)
PLEASE FEEL FREE TO CALL OUR OFFICE WITH ANY PROBLEMS OR QUESTIONS ([phone])      

## 2020-01-03 NOTE — Progress Notes (Signed)
Dysport Injection for spasticity using needle EMG guidance Indication:  Spastic hemiplegia affecting dominant side (HCC) G81.10   Dilution: 500 Units/55ml        Total Units Injected:  1000 Indication: Severe spasticity which interferes with ADL,mobility and/or  hygiene and is unresponsive to medication management and other conservative care Informed consent was obtained after describing risks and benefits of the procedure with the patient. This includes bleeding, bruising, infection, excessive weakness, or medication side effects. A REMS form is on file and signed.  RUE Needle: 86mm injectable monopolar needle electrode  Number of units per muscle Pectoralis Major 0 units Pectoralis Minor 0 units Biceps 0 units Brachioradialis 350 units FCR 0 units FCU 0 units FDS 50 units FDP 50 units FPL 0 units Pronator Teres 200 units Pronator Quadratus 350 units Lumbricals 0 units  All injections were done after obtaining appropriate EMG activity and after negative drawback for blood. The patient tolerated the procedure well. Post procedure instructions were given.

## 2020-04-03 ENCOUNTER — Other Ambulatory Visit: Payer: Self-pay

## 2020-04-03 ENCOUNTER — Encounter
Payer: BC Managed Care – PPO | Attending: Physical Medicine & Rehabilitation | Admitting: Physical Medicine & Rehabilitation

## 2020-04-03 ENCOUNTER — Encounter: Payer: Self-pay | Admitting: Physical Medicine & Rehabilitation

## 2020-04-03 VITALS — BP 114/76 | HR 75 | Temp 98.0°F | Ht 68.0 in | Wt 167.4 lb

## 2020-04-03 DIAGNOSIS — G8110 Spastic hemiplegia affecting unspecified side: Secondary | ICD-10-CM | POA: Diagnosis present

## 2020-04-03 DIAGNOSIS — S062X5S 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 pre-existing conscious levels, sequela: Secondary | ICD-10-CM | POA: Diagnosis present

## 2020-04-03 NOTE — Patient Instructions (Signed)
PLEASE FEEL FREE TO CALL OUR OFFICE WITH ANY PROBLEMS OR QUESTIONS ([phone])      

## 2020-04-03 NOTE — Progress Notes (Signed)
Subjective:    Patient ID: Paul Kane, male    DOB: 1985/08/05, 34 y.o.   MRN: [id number]  HPI  Patient is here in follow-up of his spastic hemiplegia and deficits related to his brain injury and CVA.  In June we performed Dysport injections 1000 units to right upper extremity, focusing on elbow flexors and wrist flexors.  He had good results with these with improved wrist extension especially in his elbow.  Pronation continues to be an area where he has a lot of spasticity.  He experienced no ill effects from the Dysport injections.  He was involved in therapy for a short while until Covid restrictions tightened.  He does do some walking around the house and does walk on a limited basis outside the home.  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  Family remain very supportive.  Pain Inventory Average Pain When it does hurt while extending the righ arm. Pain Right Now 0 My pain is intermittent and only with movement & streching.  LOCATION OF PAIN  Right & right leg.  BOWEL Number of stools per week: 2-3 weeks Oral laxative use lots of fiber & veggies. Type of laxative   Enema or suppository use No  History of colostomy No  Incontinent No   BLADDER Normal In and out cath, frequency: none Able to self cath n/a Bladder incontinence No  Frequent urination No  Leakage with coughing No  Difficulty starting stream No  Incomplete bladder emptying No    Mobility walk without assistance ability to climb steps?  yes do you drive?  no  Function disabled: date disabled 2011 I need assistance with the following:  dressing, bathing, toileting, meal prep, household duties, shopping and Family helps out.  Neuro/Psych weakness numbness trouble walking spasms  Prior Studies Any changes since last visit?  no  Physicians involved in your care Any changes since last visit?  no   Family History  Problem Relation Age of Onset  . Diabetes Other   .  Hyperlipidemia Other   . Hypertension Other    Social History   Socioeconomic History  . Marital status: Single    Spouse name: Not on file  . Number of children: Not on file  . Years of education: Not on file  . Highest education level: Not on file  Occupational History  . Not on file  Tobacco Use  . Smoking status: Never Smoker  . Smokeless tobacco: Never Used  Vaping Use  . Vaping Use: Never used  Substance and Sexual Activity  . Alcohol use: No  . Drug use: No  . Sexual activity: Never  Other Topics Concern  . Not on file  Social History Narrative  . Not on file   Social Determinants of Health   Financial Resource Strain:   . Difficulty of Paying Living Expenses: Not on file  Food Insecurity:   . Worried About Programme researcher, broadcasting/film/video in the Last Year: Not on file  . Ran Out of Food in the Last Year: Not on file  Transportation Needs:   . Lack of Transportation (Medical): Not on file  . Lack of Transportation (Non-Medical): Not on file  Physical Activity:   . Days of Exercise per Week: Not on file  . Minutes of Exercise per Session: Not on file  Stress:   . Feeling of Stress : Not on file  Social Connections:   . Frequency of Communication  with Friends and Family: Not on file  . Frequency of Social Gatherings with Friends and Family: Not on file  . Attends Religious Services: Not on file  . Active Member of Clubs or Organizations: Not on file  . Attends Banker Meetings: Not on file  . Marital Status: Not on file   Past Surgical History:  Procedure Laterality Date  . cramiectomy    . CRANIOPLASTY    . REMOVAL OF GASTROSTOMY TUBE    . VENTRICULOPERITONEAL SHUNT     Past Medical History:  Diagnosis Date  . 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HCC)   . Chronic hepatitis B (HCC)   . Fixed pupils   . 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HCC)   . Hypertension   . Intracranial injury of other and unspecified nature, without mention of open  intracranial wound, unspecified state of consciousness   . Intracranial shunt   . Paralysis (HCC)   . Spastic hemiplegia affecting dominant side (HCC)   . Stroke (HCC)   . Traumatic brain injury (HCC)   . Trouble swallowing   . Visual disturbance   . Weakness    BP 114/76   Pulse 75   Temp 98 F (36.7 C)   Ht 5\' 8"  (1.727 m)   Wt 167 lb 6.4 oz (75.9 kg)   SpO2 97%   BMI 25.45 kg/m   Opioid Risk Score:   Fall Risk Score:  `1  Depression screen PHQ 2/9  Depression screen [facility] 2/9 01/03/2020 11/29/2017 03/16/2017 09/03/2015 03/27/2015 10/29/2014  Decreased Interest 0 0 1 1 1 1   Down, Depressed, Hopeless 0 0 1 1 1 1   PHQ - 2 Score 0 0 2 2 2 2   Altered sleeping 0 - - - - 2  Tired, decreased energy 0 - - - - 1  Change in appetite 0 - - - - 1  Feeling bad or failure about yourself  0 - - - - 3  Trouble concentrating 0 - - - - 2  Moving slowly or fidgety/restless 0 - - - - 1  Suicidal thoughts 0 - - - - 1  PHQ-9 Score 0 - - - - 13  Some recent data might be hidden   Review of Systems  Constitutional: Negative.   HENT: Negative.   Eyes: Negative.   Respiratory: Negative.   Cardiovascular: Negative.   Gastrointestinal: Negative.   Endocrine: Negative.   Genitourinary: Negative.   Musculoskeletal:       Spasms & balance problems  Skin: Negative.   Allergic/Immunologic: Negative.   Neurological: Positive for weakness and numbness.  All other systems reviewed and are negative.      Objective:   Physical Exam General: No acute distress HEENT: EOMI, oral membranes moist Cards: reg rate  Chest: normal effort Abdomen: Soft, NT, ND Skin: dry, intact Extremities: no edema   Wrist flexors 1 out of 4 has are elbow flexors.  Pronators 2-3 out of 4 with tightness notable at the pronator teres most prominently. 1/4 tone in the plantar flexors of the right foot.  R strength is 4 out of 5 in the right hip and knee extenders.  He is still weak with ankle dorsiflexion plantarflexion.     Gait remains wide-based.  Musc:  Recurvatum right knee.  Speech remains very dysarthric     Assessment & Plan:     ASSESSMENT:   1. History of traumatic brain injury with hydrocephalus, meningitis,   and right pontine stroke.  2. Spastic tetraplegia, dominant side more affected 3. severe oropharyngeal dysphagia--improved--tolerating diet well.  Continues on thickened liquids 4. Absence seizures 5. Mechanical Low back pain: due to posture/altered gait patterns       PLAN:   1.  Continue with HEP.  Encouraged him to get back to the [facility] and into the pool and using some the other machines for aerobic exercise.  Walking may not be the best thing for him to do on a repetitive basis given his gait mechanics  2.    Return for Dysport injections 1000 units to right upper extremity with focus again on pronator teres and pronator quadratus  3.  Vimpat and keppra for seizure  4. tylenol or voltaren for pain   10 minutes of face to face patient care time were spent during this visit. All questions were encouraged and answered.  Follow up with me in 1 mos for Dysport.Marland Kitchen

## 2020-05-18 ENCOUNTER — Ambulatory Visit: Payer: BC Managed Care – PPO | Attending: Internal Medicine

## 2020-05-18 DIAGNOSIS — Z23 Encounter for immunization: Secondary | ICD-10-CM

## 2020-05-18 NOTE — Progress Notes (Signed)
   Covid-19 Vaccination Clinic  Name:  Paul Kane    MRN: [id number] DOB: 1986/02/25  05/18/2020  [name] was observed post Covid-19 immunization for 15 minutes without incident. He was provided with Vaccine Information Sheet and instruction to access the V-Safe system.   [name] was instructed to call 911 with any severe reactions post vaccine: Marland Kitchen Difficulty breathing  . Swelling of face and throat  . A fast heartbeat  . A bad rash all over body  . Dizziness and weakness

## 2020-05-29 ENCOUNTER — Ambulatory Visit: Payer: BC Managed Care – PPO | Admitting: Physical Medicine & Rehabilitation

## 2020-06-05 ENCOUNTER — Encounter
Payer: BC Managed Care – PPO | Attending: Physical Medicine & Rehabilitation | Admitting: Physical Medicine & Rehabilitation

## 2020-06-05 ENCOUNTER — Encounter: Payer: Self-pay | Admitting: Physical Medicine & Rehabilitation

## 2020-06-05 ENCOUNTER — Other Ambulatory Visit: Payer: Self-pay

## 2020-06-05 VITALS — BP 108/72 | HR 76 | Temp 98.6°F | Ht 68.0 in | Wt 169.6 lb

## 2020-06-05 DIAGNOSIS — G8110 Spastic hemiplegia affecting unspecified side: Secondary | ICD-10-CM | POA: Diagnosis present

## 2020-06-05 NOTE — Patient Instructions (Signed)
callme  

## 2020-06-05 NOTE — Progress Notes (Signed)
Dysport Injection for spasticity using needle EMG guidance Indication:  No diagnosis found.   Dilution: 500 Units/66ml        Total Units Injected:  1000 Indication: Severe spasticity which interferes with ADL,mobility and/or  hygiene and is unresponsive to medication management and other conservative care Informed consent was obtained after describing risks and benefits of the procedure with the patient. This includes bleeding, bruising, infection, excessive weakness, or medication side effects. A REMS form is on file and signed.  right Needle: 72mm injectable monopolar needle electrode  Number of units per muscle Pectoralis Major 0 units Pectoralis Minor 0 units Biceps 0 units Brachioradialis 0 units FCR 50 units FCU 50 units FDS 0 units FDP 0 units FPL 0 units Pronator Teres 700 units Pronator Quadratus 200 units Lumbricals 0 units  All injections were done after obtaining appropriate EMG activity and after negative drawback for blood. The patient tolerated the procedure well. Post procedure instructions were given.

## 2020-10-02 ENCOUNTER — Encounter
Payer: BC Managed Care – PPO | Attending: Physical Medicine & Rehabilitation | Admitting: Physical Medicine & Rehabilitation

## 2020-10-02 ENCOUNTER — Encounter: Payer: Self-pay | Admitting: Physical Medicine & Rehabilitation

## 2020-10-02 ENCOUNTER — Other Ambulatory Visit: Payer: Self-pay

## 2020-10-02 VITALS — BP 99/64 | HR 74 | Temp 97.6°F | Ht 68.0 in | Wt 163.0 lb

## 2020-10-02 DIAGNOSIS — G8110 Spastic hemiplegia affecting unspecified side: Secondary | ICD-10-CM | POA: Diagnosis present

## 2020-10-02 NOTE — Patient Instructions (Signed)
PLEASE FEEL FREE TO CALL OUR OFFICE WITH ANY PROBLEMS OR QUESTIONS ([phone])      

## 2020-10-02 NOTE — Progress Notes (Signed)
Dysport Injection for spasticity using needle EMG guidance Indication:  No diagnosis found.   Dilution: 500 Units/77ml        Total Units Injected:  1000 Indication: Severe spasticity which interferes with ADL,mobility and/or  hygiene and is unresponsive to medication management and other conservative care Informed consent was obtained after describing risks and benefits of the procedure with the patient. This includes bleeding, bruising, infection, excessive weakness, or medication side effects. A REMS form is on file and signed.  right Needle: 55mm injectable monopolar needle electrode  Number of units per muscle Pectoralis Major o units Pectoralis Minor 0 units Biceps 150 units Brachioradialis 250 units FCR 50 units FCU 50 units FDS 10 units FDP 100 units FPL 0 units Pronator Teres 300 units Pronator Quadratus 0 units Lumbricals 0 units  All injections were done after obtaining appropriate EMG activity and after negative drawback for blood. The patient tolerated the procedure well. Post procedure instructions were given.

## 2020-10-25 ENCOUNTER — Other Ambulatory Visit: Payer: Self-pay

## 2020-10-25 ENCOUNTER — Ambulatory Visit: Payer: BC Managed Care – PPO | Attending: Physical Medicine & Rehabilitation

## 2020-10-25 DIAGNOSIS — R2689 Other abnormalities of gait and mobility: Secondary | ICD-10-CM | POA: Insufficient documentation

## 2020-10-25 DIAGNOSIS — M6281 Muscle weakness (generalized): Secondary | ICD-10-CM | POA: Diagnosis present

## 2020-10-25 DIAGNOSIS — R2681 Unsteadiness on feet: Secondary | ICD-10-CM | POA: Insufficient documentation

## 2020-10-25 NOTE — Patient Instructions (Signed)
Access Code: [email or web address]  URL: [email or web address] Date: 10/25/2020 Prepared by: Gustavus Bryant  Exercises Gastroc Stretch on Wall - 2 x daily - 7 x weekly - 1 sets - 3 reps - 30s hold Heel Toe Raises with Counter Support - 2 x daily - 7 x weekly - 1 sets - 3 reps - 30s hold

## 2020-10-26 NOTE — Therapy (Signed)
[facility] [address] [place], [place], [address] Phone: [phone]   Fax:  [phone]  Physical Therapy Treatment  Patient Details  Name: Paul Kane MRN: [id number] Date of Birth: [birth year] Referring Provider (PT): Dr. Riley Kill   Encounter Date: 10/25/2020   PT End of Session - 10/26/20 1659    Visit Number 1    Number of Visits 6    Date for PT Re-Evaluation 12/14/20    Authorization Type BCBS    PT Start Time 1315    PT Stop Time 1400    PT Time Calculation (min) 45 min           Past Medical History:  Diagnosis Date  . 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HCC)   . Chronic hepatitis B (HCC)   . Fixed pupils   . 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HCC)   . Hypertension   . Intracranial injury of other and unspecified nature, without mention of open intracranial wound, unspecified state of consciousness   . Intracranial shunt   . Paralysis (HCC)   . Spastic hemiplegia affecting dominant side (HCC)   . Stroke (HCC)   . Traumatic brain injury (HCC)   . Trouble swallowing   . Visual disturbance   . Weakness     Past Surgical History:  Procedure Laterality Date  . cramiectomy    . CRANIOPLASTY    . REMOVAL OF GASTROSTOMY TUBE    . VENTRICULOPERITONEAL SHUNT      There were no vitals filed for this visit.   Subjective Assessment - 10/25/20 1323    Subjective relates a history of fall 2 mos ago resulting in bruising, fall incurred after moving to new home with hardwood floors causing him to trip over R foot    Patient is accompained by: Family member    Pertinent History Hx of CP    How long can you sit comfortably? unlimited    How long can you stand comfortably? <10 min    How long can you walk comfortably? <5 min    Currently in Pain? No/denies                                     PT Education - 10/25/20 1349    Education Details see HEP [id number]    Person(s)  Educated Patient;Caregiver(s)    Methods Explanation;Demonstration;Tactile cues;Verbal cues;Handout    Comprehension Verbalized understanding;Returned demonstration;Need further instruction                      Plan - 10/26/20 1701    Clinical Impression Statement patient           Patient will benefit from skilled therapeutic intervention in order to improve the following deficits and impairments:     Visit Diagnosis: Unsteadiness on feet  Muscle weakness (generalized)     Problem List Patient Active Problem List   Diagnosis Date Noted  . Seizure disorder (HCC) 04/11/2018  . Mechanical low back pain 05/12/2017  . 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 Deafness, sensorineural 04/20/2012  . Buzzing in ear 04/20/2012  . Leaking percutaneous endoscopic gastrostomy (PEG) tube (HCC) 02/04/2012  . 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 Cerebral infarct (HCC) 10/12/2011  . Spastic hemiplegia affecting dominant side (HCC) 10/12/2011  .  Dysphagia 10/12/2011  . Functioning G-tube 03/24/2011  . ASPIRATION PNEUMONIA 10/06/2010  . 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 Hemiplegia of dominant side (HCC) 07/28/2010  . CEREBRAL HEMORRHAGE 07/28/2010  . PERSONAL HISTORY OF TRAUMATIC BRAIN INJURY 07/02/2010  . ALLERGIC RHINITIS CAUSE UNSPECIFIED 10/25/2008  . GASTROENTERITIS 08/09/2008  . CONTACT DERMATITIS&OTHER ECZEMA DUE DETERGENTS 08/18/2007  . HEPATITIS B, CHRONIC 06/28/2007    Hildred Laser 10/26/2020, Deno Etienne PM  [facility] [address] [place], [place], [address] Phone: [phone]   Fax:  [phone]  Name: Paul Kane MRN: [id number] Date of Birth: [birth year]

## 2020-10-26 NOTE — Therapy (Addendum)
[facility] [address] [place], [place], [address] Phone: [phone]   Fax:  [phone]  Physical Therapy Evaluation  Patient Details  Name: Paul Kane MRN: [id number] Date of Birth: 1985/09/04 Referring Provider (PT): Dr. Riley Kill   Encounter Date: 10/25/2020    Past Medical History:  Diagnosis Date  . 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HCC)   . Chronic hepatitis B (HCC)   . Fixed pupils   . 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HCC)   . Hypertension   . Intracranial injury of other and unspecified nature, without mention of open intracranial wound, unspecified state of consciousness   . Intracranial shunt   . Paralysis (HCC)   . Spastic hemiplegia affecting dominant side (HCC)   . Stroke (HCC)   . Traumatic brain injury (HCC)   . Trouble swallowing   . Visual disturbance   . Weakness     Past Surgical History:  Procedure Laterality Date  . cramiectomy    . CRANIOPLASTY    . REMOVAL OF GASTROSTOMY TUBE    . VENTRICULOPERITONEAL SHUNT      There were no vitals filed for this visit.      10/25/20 0001  Assessment  Medical Diagnosis CP  Referring Provider (PT) Dr. Riley Kill  Next MD Visit PRN  Prior Therapy OPPT  Precautions  Precautions Fall  Balance Screen  Has the patient fallen in the past 6 months Yes  How many times? 1  Has the patient had a decrease in activity level because of a fear of falling?  Yes  Is the patient reluctant to leave their home because of a fear of falling?  Yes  Prior Function  Level of Independence Independent with gait  Vocation On disability  Cognition  Overall Cognitive Status History of cognitive impairments - at baseline  Sensation  Light Touch Appears Intact  Coordination  Gross Motor Movements are Fluid and Coordinated No  Heel Shin Test slow but able to complete  ROM / Strength  AROM / PROM / Strength Strength;AROM  AROM  AROM Assessment Site Ankle   Overall AROM  Deficits  Overall AROM Comments Patient presents with tight heel cors, R>L  Right/Left Ankle Right  Right Ankle Dorsiflexion 0  Strength  Overall Strength Within functional limits for tasks performed  Palpation  Palpation comment tender to L peroneals  Ambulation/Gait  Ambulation/Gait Yes  Ambulation/Gait Assistance 5: Supervision  Ambulation Distance (Feet) 50 Feet  Assistive device None  Gait Pattern Step-through pattern;Decreased arm swing - right;Decreased arm swing - left;Decreased step length - right;Decreased step length - left;Scissoring;Narrow base of support;Poor foot clearance - left;Poor foot clearance - right  Ambulation Surface Level;Indoor                   Objective measurements completed on examination: See above findings.                 PT Short Term Goals - 10/29/20 0747      PT SHORT TERM GOAL #1   Title STGs = LTGs             PT Long Term Goals - 10/29/20 0747      PT LONG TERM GOAL #1   Title The patient will demonstrate initiation and completion of HEP with supervision from caregiver at home    Baseline HEP established    Time 6    Period Weeks    Status New    Target Date  12/10/20      PT LONG TERM GOAL #2   Title Patient to regain 5d R DF PROM    Baseline 0d PROM in R DF    Time 6    Period Weeks    Status New    Target Date 12/10/20      PT LONG TERM GOAL #3   Title The patient will ambulate 500' outdoors mod I over varying surfaces and surface heights including grass, curbs, and inclines to demonstrate improved functional mobility with community ambulation.    Baseline 69ft with CGA across level ground    Time 6    Period Weeks    Status New      PT LONG TERM GOAL #4   Title Assess and establish TUG goal    Baseline NT    Time 6    Period Weeks    Status New    Target Date 12/10/20      PT LONG TERM GOAL #5   Title Assess   and establish DGI goal    Baseline NT    Time 6     Period Weeks    Status New    Target Date 12/10/20                   Patient will benefit from skilled therapeutic intervention in order to improve the following deficits and impairments:  Abnormal gait,Decreased endurance,Decreased activity tolerance,Decreased strength,Decreased balance,Decreased mobility,Difficulty walking,Increased muscle spasms,Decreased range of motion,Decreased coordination  Visit Diagnosis: Unsteadiness on feet  Muscle weakness (generalized)     Problem List Patient Active Problem List   Diagnosis Date Noted  . Seizure disorder (HCC) 04/11/2018  . Mechanical low back pain 05/12/2017  . 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 Deafness, sensorineural 04/20/2012  . Buzzing in ear 04/20/2012  . Leaking percutaneous endoscopic gastrostomy (PEG) tube (HCC) 02/04/2012  . 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 Cerebral infarct (HCC) 10/12/2011  . Spastic hemiplegia affecting dominant side (HCC) 10/12/2011  . Dysphagia 10/12/2011  . Functioning G-tube 03/24/2011  . ASPIRATION PNEUMONIA 10/06/2010  . 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 Hemiplegia of dominant side (HCC) 07/28/2010  . CEREBRAL HEMORRHAGE 07/28/2010  . PERSONAL HISTORY OF TRAUMATIC BRAIN INJURY 07/02/2010  . ALLERGIC RHINITIS CAUSE UNSPECIFIED 10/25/2008  . GASTROENTERITIS 08/09/2008  . CONTACT DERMATITIS&OTHER ECZEMA DUE DETERGENTS 08/18/2007  . HEPATITIS B, CHRONIC 06/28/2007    Farris Has Verley Pariseau PT 10/29/2020, 7:56 AM  [facility] [address] [place], [place], [address] Phone: [phone]   Fax:  [phone]  Name: Paul Kane MRN: [id number] Date of Birth: 1985-12-05

## 2020-10-29 ENCOUNTER — Other Ambulatory Visit: Payer: Self-pay

## 2020-10-29 ENCOUNTER — Ambulatory Visit: Payer: BC Managed Care – PPO

## 2020-10-29 DIAGNOSIS — M6281 Muscle weakness (generalized): Secondary | ICD-10-CM

## 2020-10-29 DIAGNOSIS — R2681 Unsteadiness on feet: Secondary | ICD-10-CM | POA: Diagnosis not present

## 2020-10-29 NOTE — Patient Instructions (Signed)
Added 5x STS to perform at home

## 2020-10-29 NOTE — Therapy (Signed)
[facility] [address] [place], [place], [address] Phone: [phone]   Fax:  [phone]  Physical Therapy Treatment  Patient Details  Name: Paul Kane MRN: [id number] Date of Birth: February 14, 1986 Referring Provider (PT): Dr. Riley Kill   Encounter Date: 10/29/2020   PT End of Session - 10/29/20 1223    Visit Number 2    Number of Visits 7    Date for PT Re-Evaluation 12/14/20    Authorization Type BCBS    PT Start Time 1145    PT Stop Time 1230    PT Time Calculation (min) 45 min    Activity Tolerance Patient tolerated treatment well    Behavior During Therapy [facility] for tasks assessed/performed;Anxious           Past Medical History:  Diagnosis Date  . 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HCC)   . Chronic hepatitis B (HCC)   . Fixed pupils   . 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HCC)   . Hypertension   . Intracranial injury of other and unspecified nature, without mention of open intracranial wound, unspecified state of consciousness   . Intracranial shunt   . Paralysis (HCC)   . Spastic hemiplegia affecting dominant side (HCC)   . Stroke (HCC)   . Traumatic brain injury (HCC)   . Trouble swallowing   . Visual disturbance   . Weakness     Past Surgical History:  Procedure Laterality Date  . cramiectomy    . CRANIOPLASTY    . REMOVAL OF GASTROSTOMY TUBE    . VENTRICULOPERITONEAL SHUNT      There were no vitals filed for this visit.   Subjective Assessment - 10/29/20 1150    Subjective reports he is compliant with HEP but fell yesterday as he was bending over to retrieve an object off of the floor, felt he lost his balance, soreness to R elbow and knee    Patient is accompained by: Family member    Pertinent History Hx of CP    How long can you sit comfortably? unlimited    How long can you stand comfortably? <10 min    How long can you walk comfortably? <5 min    Currently in Pain? Yes    Pain  Score 2     Pain Location Elbow    Pain Orientation Right    Pain Descriptors / Indicators Sore    Pain Type Acute pain    Pain Onset Yesterday    Pain Frequency Occasional                             [facility] Adult PT Treatment/Exercise - 10/29/20 0001      Transfers   Transfers Sit to Stand    Comments performed 5 reps      Lumbar Exercises: Supine   Bridge Non-compliant;10 reps;Limitations    Bridge Limitations 2x10    Other Supine Lumbar Exercises supine marching 2x10      Knee/Hip Exercises: Supine   Other Supine Knee/Hip Exercises knee fallouts, 2x10      Manual Therapy   Manual Therapy Soft tissue mobilization    Soft tissue mobilization STM to R peroneals combined with AAROM in ankle circles 10CW/CCW and into DF                  PT Education - 10/29/20 1437    Education Details added 5x STS to HEP  Person(s) Educated Patient;Caregiver(s)    Methods Explanation;Demonstration;Tactile cues;Verbal cues;Handout    Comprehension Verbalized understanding;Returned demonstration;Need further instruction            PT Short Term Goals - 10/29/20 0747      PT SHORT TERM GOAL #1   Title STGs = LTGs             PT Long Term Goals - 10/29/20 0747      PT LONG TERM GOAL #1   Title The patient will demonstrate initiation and completion of HEP with supervision from caregiver at home    Baseline HEP established    Time 6    Period Weeks    Status New    Target Date 12/10/20      PT LONG TERM GOAL #2   Title Patient to regain 5d R DF PROM    Baseline 0d PROM in R DF    Time 6    Period Weeks    Status New    Target Date 12/10/20      PT LONG TERM GOAL #3   Title The patient will ambulate 500' outdoors mod I over varying surfaces and surface heights including grass, curbs, and inclines to demonstrate improved functional mobility with community ambulation.    Baseline 71ft with CGA across level ground    Time 6    Period Weeks     Status New      PT LONG TERM GOAL #4   Title Assess and establish TUG goal    Baseline NT    Time 6    Period Weeks    Status New    Target Date 12/10/20      PT LONG TERM GOAL #5   Title Assess   and establish DGI goal    Baseline NT    Time 6    Period Weeks    Status New    Target Date 12/10/20                 Plan - 10/29/20 1438    Clinical Impression Statement Todays skilled session focused on soft tissue mobs to R peroneals and R hip and knee strength/stabilization exercises on supine to control RLE during gait.  Required manual and tactile cues during supine tasks to return proper form, symptoms improved following session as less pain reported    Personal Factors and Comorbidities Comorbidity 1    Comorbidities CP    Examination-Activity Limitations Locomotion Level    Stability/Clinical Decision Making Stable/Uncomplicated    Rehab Potential Good    PT Frequency 1x / week    PT Duration 6 weeks    PT Treatment/Interventions ADLs/Self Care Home Management;Aquatic Therapy;DME Instruction;Gait training;Stair training;Functional mobility training;Therapeutic activities;Therapeutic exercise;Balance training;Neuromuscular re-education;Patient/family education;Orthotic Fit/Training;Manual techniques    PT Next Visit Plan Assess TUG and DGI    PT Home Exercise Plan [phone]           Patient will benefit from skilled therapeutic intervention in order to improve the following deficits and impairments:  Abnormal gait,Decreased endurance,Decreased activity tolerance,Decreased strength,Decreased balance,Decreased mobility,Difficulty walking,Increased muscle spasms,Decreased range of motion,Decreased coordination  Visit Diagnosis: Unsteadiness on feet  Muscle weakness (generalized)     Problem List Patient Active Problem List   Diagnosis Date Noted  . Seizure disorder (HCC) 04/11/2018  . Mechanical low back pain 05/12/2017  . 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 Deafness, sensorineural 04/20/2012  . Buzzing in ear 04/20/2012  . Leaking percutaneous endoscopic gastrostomy (PEG)  tube (HCC) 02/04/2012  . 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 Cerebral infarct (HCC) 10/12/2011  . Spastic hemiplegia affecting dominant side (HCC) 10/12/2011  . Dysphagia 10/12/2011  . Functioning G-tube 03/24/2011  . ASPIRATION PNEUMONIA 10/06/2010  . 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 Hemiplegia of dominant side (HCC) 07/28/2010  . CEREBRAL HEMORRHAGE 07/28/2010  . PERSONAL HISTORY OF TRAUMATIC BRAIN INJURY 07/02/2010  . ALLERGIC RHINITIS CAUSE UNSPECIFIED 10/25/2008  . GASTROENTERITIS 08/09/2008  . CONTACT DERMATITIS&OTHER ECZEMA DUE DETERGENTS 08/18/2007  . HEPATITIS B, CHRONIC 06/28/2007    Hildred Laser PT 10/29/2020, 2:43 PM  [facility] [address] [place], [place], [address] Phone: [phone]   Fax:  [phone]  Name: TRADARIUS REINWALD MRN: [id number] Date of Birth: 1986/01/06

## 2020-10-29 NOTE — Addendum Note (Signed)
Addended by: Hildred Laser on: 10/29/2020 07:58 AM   Modules accepted: Orders

## 2020-11-07 ENCOUNTER — Other Ambulatory Visit: Payer: Self-pay

## 2020-11-07 ENCOUNTER — Ambulatory Visit: Payer: BC Managed Care – PPO

## 2020-11-07 DIAGNOSIS — M6281 Muscle weakness (generalized): Secondary | ICD-10-CM

## 2020-11-07 DIAGNOSIS — R2681 Unsteadiness on feet: Secondary | ICD-10-CM

## 2020-11-07 NOTE — Therapy (Signed)
[facility] [address] [place], [place], [address] Phone: [phone]   Fax:  [phone]  Physical Therapy Treatment  Patient Details  Name: Paul Kane MRN: [id number] Date of Birth: 1985/12/17 Referring Provider (PT): Dr. Riley Kill   Encounter Date: 11/07/2020   PT End of Session - 11/07/20 1212    Visit Number 3    Number of Visits 7    Date for PT Re-Evaluation 12/14/20    Authorization Type BCBS    PT Start Time 1145    PT Stop Time 1230    PT Time Calculation (min) 45 min    Activity Tolerance Patient tolerated treatment well    Behavior During Therapy [facility] for tasks assessed/performed;Anxious           Past Medical History:  Diagnosis Date  . 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HCC)   . Chronic hepatitis B (HCC)   . Fixed pupils   . 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HCC)   . Hypertension   . Intracranial injury of other and unspecified nature, without mention of open intracranial wound, unspecified state of consciousness   . Intracranial shunt   . Paralysis (HCC)   . Spastic hemiplegia affecting dominant side (HCC)   . Stroke (HCC)   . Traumatic brain injury (HCC)   . Trouble swallowing   . Visual disturbance   . Weakness     Past Surgical History:  Procedure Laterality Date  . cramiectomy    . CRANIOPLASTY    . REMOVAL OF GASTROSTOMY TUBE    . VENTRICULOPERITONEAL SHUNT      There were no vitals filed for this visit.   Subjective Assessment - 11/07/20 1206    Subjective No new c/o, no falls or pain to note    Patient is accompained by: Family member    Pertinent History Hx of CP    How long can you sit comfortably? unlimited    How long can you stand comfortably? <10 min    How long can you walk comfortably? <5 min    Pain Onset Yesterday                             [facility] Adult PT Treatment/Exercise - 11/07/20 0001      Transfers   Transfers Sit to Stand     Comments 15 reps total with no UE support, LLE fwd for last 5 reps      Lumbar Exercises: Supine   Bridge with [employer] Squeeze 15 reps;Limitations    Bridge with [employer] Limitations 2x15 with 1.1# ball squeeze    Other Supine Lumbar Exercises suppine marching 2# on RLE      Knee/Hip Exercises: Supine   Other Supine Knee/Hip Exercises knee fallouts RLE 2x15 2#      Manual Therapy   Manual Therapy Joint mobilization;Soft tissue mobilization;Passive ROM    Manual therapy comments posterior distal fibula mobs and anterior drawer of R ankle followed by PROM    Soft tissue mobilization STM to R peroneals                    PT Short Term Goals - 10/29/20 0747      PT SHORT TERM GOAL #1   Title STGs = LTGs             PT Long Term Goals - 10/29/20 0747      PT LONG TERM  GOAL #1   Title The patient will demonstrate initiation and completion of HEP with supervision from caregiver at home    Baseline HEP established    Time 6    Period Weeks    Status New    Target Date 12/10/20      PT LONG TERM GOAL #2   Title Patient to regain 5d R DF PROM    Baseline 0d PROM in R DF    Time 6    Period Weeks    Status New    Target Date 12/10/20      PT LONG TERM GOAL #3   Title The patient will ambulate 500' outdoors mod I over varying surfaces and surface heights including grass, curbs, and inclines to demonstrate improved functional mobility with community ambulation.    Baseline 39ft with CGA across level ground    Time 6    Period Weeks    Status New      PT LONG TERM GOAL #4   Title Assess and establish TUG goal    Baseline NT    Time 6    Period Weeks    Status New    Target Date 12/10/20      PT LONG TERM GOAL #5   Title Assess   and establish DGI goal    Baseline NT    Time 6    Period Weeks    Status New    Target Date 12/10/20                 Plan - 11/07/20 1213    Clinical Impression Statement Todays skilled session consisted of continued  joint and soft tissue mobs to R ankle and peroneal muscles, demoing full PROM in R ankle AROM limited due to disease process    Personal Factors and Comorbidities Comorbidity 1    Comorbidities CP    Examination-Activity Limitations Locomotion Level    Stability/Clinical Decision Making Stable/Uncomplicated    Rehab Potential Good    PT Frequency 1x / week    PT Duration 6 weeks    PT Treatment/Interventions ADLs/Self Care Home Management;Aquatic Therapy;DME Instruction;Gait training;Stair training;Functional mobility training;Therapeutic activities;Therapeutic exercise;Balance training;Neuromuscular re-education;Patient/family education;Orthotic Fit/Training;Manual techniques    PT Next Visit Plan Assess TUG and DGI as pain allows    PT Home Exercise Plan [phone]           Patient will benefit from skilled therapeutic intervention in order to improve the following deficits and impairments:  Abnormal gait,Decreased endurance,Decreased activity tolerance,Decreased strength,Decreased balance,Decreased mobility,Difficulty walking,Increased muscle spasms,Decreased range of motion,Decreased coordination  Visit Diagnosis: Unsteadiness on feet  Muscle weakness (generalized)     Problem List Patient Active Problem List   Diagnosis Date Noted  . Seizure disorder (HCC) 04/11/2018  . Mechanical low back pain 05/12/2017  . 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 Deafness, sensorineural 04/20/2012  . Buzzing in ear 04/20/2012  . Leaking percutaneous endoscopic gastrostomy (PEG) tube (HCC) 02/04/2012  . 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 Cerebral infarct (HCC) 10/12/2011  . Spastic hemiplegia affecting dominant side (HCC) 10/12/2011  . Dysphagia 10/12/2011  . Functioning G-tube 03/24/2011  . ASPIRATION PNEUMONIA 10/06/2010  . 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  Hemiplegia of dominant side (HCC) 07/28/2010  . CEREBRAL HEMORRHAGE 07/28/2010  . PERSONAL HISTORY OF TRAUMATIC BRAIN INJURY 07/02/2010  . ALLERGIC RHINITIS CAUSE UNSPECIFIED 10/25/2008  . GASTROENTERITIS 08/09/2008  .  CONTACT DERMATITIS&OTHER ECZEMA DUE DETERGENTS 08/18/2007  . HEPATITIS B, CHRONIC 06/28/2007    Hildred Laser PT 11/07/2020, 6:07 PM  [facility] [address] [place], [place], [address] Phone: [phone]   Fax:  [phone]  Name: Paul Kane MRN: [id number] Date of Birth: 03-Aug-1985

## 2020-11-13 ENCOUNTER — Ambulatory Visit: Payer: BC Managed Care – PPO

## 2020-11-14 ENCOUNTER — Ambulatory Visit: Payer: BC Managed Care – PPO

## 2020-11-14 ENCOUNTER — Other Ambulatory Visit: Payer: Self-pay

## 2020-11-14 DIAGNOSIS — R2681 Unsteadiness on feet: Secondary | ICD-10-CM | POA: Diagnosis not present

## 2020-11-14 DIAGNOSIS — R2689 Other abnormalities of gait and mobility: Secondary | ICD-10-CM

## 2020-11-14 DIAGNOSIS — M6281 Muscle weakness (generalized): Secondary | ICD-10-CM

## 2020-11-14 NOTE — Patient Instructions (Signed)
Added standing marching, fwd and lat step ups

## 2020-11-14 NOTE — Therapy (Signed)
[facility] [address] [place], [place], [address] Phone: [phone]   Fax:  [phone]  Physical Therapy Treatment  Patient Details  Name: Paul Kane MRN: [id number] Date of Birth: [birth year] Referring Provider (PT): Dr. Riley Kill   Encounter Date: 11/14/2020   PT End of Session - 11/14/20 1150    Visit Number 4    Number of Visits 7    Date for PT Re-Evaluation 12/14/20    Authorization Type BCBS    PT Start Time 1025    PT Stop Time 1100    PT Time Calculation (min) 35 min    Activity Tolerance Patient tolerated treatment well    Behavior During Therapy [facility] for tasks assessed/performed;Anxious           Past Medical History:  Diagnosis Date  . 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HCC)   . Chronic hepatitis B (HCC)   . Fixed pupils   . 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HCC)   . Hypertension   . Intracranial injury of other and unspecified nature, without mention of open intracranial wound, unspecified state of consciousness   . Intracranial shunt   . Paralysis (HCC)   . Spastic hemiplegia affecting dominant side (HCC)   . Stroke (HCC)   . Traumatic brain injury (HCC)   . Trouble swallowing   . Visual disturbance   . Weakness     Past Surgical History:  Procedure Laterality Date  . cramiectomy    . CRANIOPLASTY    . REMOVAL OF GASTROSTOMY TUBE    . VENTRICULOPERITONEAL SHUNT      There were no vitals filed for this visit.   Subjective Assessment - 11/14/20 1029    Subjective Has been doing better, no falls or LOB noted, has not heard from [facility] regarding orthotic consult, advised to f/u and phone # provided, has not been conpliant with stretching program    Patient is accompained by: Family member    Pertinent History Hx of CP              [facility] PT Assessment - 11/14/20 0001      Dynamic Gait Index   Level Surface Moderate Impairment    Change in Gait Speed Moderate  Impairment    Gait with Horizontal Head Turns Moderate Impairment    Gait with Vertical Head Turns Moderate Impairment    Gait and Pivot Turn Moderate Impairment    Step Over Obstacle Moderate Impairment    Step Around Obstacles Mild Impairment    Steps Moderate Impairment    Total Score 9      Timed Up and Go Test   Normal TUG (seconds) 19.72                              Balance Exercises - 11/14/20 0001      Balance Exercises: Standing   Standing Eyes Opened Wide (BOA);Foam/compliant surface;2 reps;30 secs    Step Ups Forward;Lateral;4 inch;UE support 1;Limitations    Step Ups Limitations performed in // bars with LUE assist, 10 reps ea. LE             PT Education - 11/14/20 1149    Education Details added standing marching, fwd and lat stepping tasks    Person(s) Educated Patient;Caregiver(s)    Methods Explanation;Demonstration;Tactile cues;Verbal cues;Handout    Comprehension Verbalized understanding;Returned demonstration;Need further instruction;Verbal cues required  PT Short Term Goals - 10/29/20 0747      PT SHORT TERM GOAL #1   Title STGs = LTGs             PT Long Term Goals - 11/14/20 1106      PT LONG TERM GOAL #1   Title The patient will demonstrate initiation and completion of HEP with supervision from caregiver at home    Baseline HEP established; 11/14/20 HEP reviewed and revised based on needs and deficits, has not been performing consistently    Time 6    Period Weeks    Status On-going      PT LONG TERM GOAL #2   Title Patient to regain 5d R DF PROM    Baseline 0d PROM in R DF; 11/14/20 PROM DF 5d    Time 6    Period Weeks    Status Achieved      PT LONG TERM GOAL #3   Title The patient will ambulate 500' outdoors mod I over varying surfaces and surface heights including grass, curbs, and inclines to demonstrate improved functional mobility with community ambulation.    Baseline 80ft with CGA across level  ground; 11/14/20 ambulation of 278ft in clinic to complete functional testing requiring CGA to prevent falls    Time 6    Period Weeks    Status On-going      PT LONG TERM GOAL #4   Title Assess and establish TUG goal; 11/14/20 goal is 15s    Baseline NT; 11/14/20 TUG time 19.72s    Time 6    Period Weeks    Status Revised      PT LONG TERM GOAL #5   Title Assess and establish DGI goal    Baseline NT; 11/14/20 DGI score 9, goal is 15(prefer to re-assess following orthotic fitting)    Time 6    Period Weeks    Status On-going                 Plan - 11/14/20 1151    Clinical Impression Statement Patient arrived 10 min late for session.  Todays rehab session included assessment of progress towards goals and review of HEP revising program to account for progress made and outstanding deficits.  Introduced stepping tasks in // bars as well as static balance on compliant surfaces, PROM assessed at end of session.  Patient appears to have all the components of gait to correct R foot scuff but remains apprehensive about returning to ambulation w/o CG support following the traumatic fall which precipitated the need for PT.    Personal Factors and Comorbidities Comorbidity 1    Comorbidities CP    Examination-Activity Limitations Locomotion Level    Stability/Clinical Decision Making Stable/Uncomplicated    Rehab Potential Good    PT Frequency 1x / week    PT Duration 6 weeks    PT Treatment/Interventions ADLs/Self Care Home Management;Aquatic Therapy;DME Instruction;Gait training;Stair training;Functional mobility training;Therapeutic activities;Therapeutic exercise;Balance training;Neuromuscular re-education;Patient/family education;Orthotic Fit/Training;Manual techniques    PT Next Visit Plan Follow up on orthotic consult and continue to encourage stepping tasks to build confidence in returning to unsupported ambulation    PT Home Exercise Plan [phone]           Patient will benefit  from skilled therapeutic intervention in order to improve the following deficits and impairments:  Abnormal gait,Decreased endurance,Decreased activity tolerance,Decreased strength,Decreased balance,Decreased mobility,Difficulty walking,Increased muscle spasms,Decreased range of motion,Decreased coordination  Visit Diagnosis: Unsteadiness on feet  Muscle  weakness (generalized)  Other abnormalities of gait and mobility     Problem List Patient Active Problem List   Diagnosis Date Noted  . Seizure disorder (HCC) 04/11/2018  . Mechanical low back pain 05/12/2017  . 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 Deafness, sensorineural 04/20/2012  . Buzzing in ear 04/20/2012  . Leaking percutaneous endoscopic gastrostomy (PEG) tube (HCC) 02/04/2012  . 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 Cerebral infarct (HCC) 10/12/2011  . Spastic hemiplegia affecting dominant side (HCC) 10/12/2011  . Dysphagia 10/12/2011  . Functioning G-tube 03/24/2011  . ASPIRATION PNEUMONIA 10/06/2010  . 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 Hemiplegia of dominant side (HCC) 07/28/2010  . CEREBRAL HEMORRHAGE 07/28/2010  . PERSONAL HISTORY OF TRAUMATIC BRAIN INJURY 07/02/2010  . ALLERGIC RHINITIS CAUSE UNSPECIFIED 10/25/2008  . GASTROENTERITIS 08/09/2008  . CONTACT DERMATITIS&OTHER ECZEMA DUE DETERGENTS 08/18/2007  . HEPATITIS B, CHRONIC 06/28/2007    Farris Has Maicee Ullman PT 11/14/2020, 12:07 PM  [facility] [address] [place], [place], [address] Phone: [phone]   Fax:  [phone]  Name: Paul Kane MRN: [id number] Date of Birth: 30-Jan-1986

## 2020-11-15 ENCOUNTER — Telehealth: Payer: Self-pay

## 2020-11-15 DIAGNOSIS — G8110 Spastic hemiplegia affecting unspecified side: Secondary | ICD-10-CM

## 2020-11-15 NOTE — Telephone Encounter (Signed)
Paul Kane needs a script for an orthotic shoe support. If granted please send to [facility].   Thank you Call back phone number [phone]

## 2020-11-18 NOTE — Telephone Encounter (Signed)
Order entered into Epic. It will need to be faxed to [facility].  thx

## 2020-11-19 ENCOUNTER — Telehealth: Payer: Self-pay

## 2020-11-19 NOTE — Telephone Encounter (Signed)
[facility] along with demographics and telephone call note.

## 2020-11-19 NOTE — Telephone Encounter (Signed)
In error

## 2020-11-20 ENCOUNTER — Ambulatory Visit: Payer: BC Managed Care – PPO

## 2020-11-21 ENCOUNTER — Ambulatory Visit: Payer: BC Managed Care – PPO | Attending: Physical Medicine & Rehabilitation

## 2020-11-21 ENCOUNTER — Other Ambulatory Visit: Payer: Self-pay

## 2020-11-21 DIAGNOSIS — R2689 Other abnormalities of gait and mobility: Secondary | ICD-10-CM | POA: Insufficient documentation

## 2020-11-21 DIAGNOSIS — M6281 Muscle weakness (generalized): Secondary | ICD-10-CM | POA: Diagnosis present

## 2020-11-21 DIAGNOSIS — R2681 Unsteadiness on feet: Secondary | ICD-10-CM | POA: Insufficient documentation

## 2020-11-21 NOTE — Therapy (Signed)
[facility] [address] [place], [place], [address] Phone: [phone]   Fax:  [phone]  Physical Therapy Treatment  Patient Details  Name: Paul Kane MRN: [id number] Date of Birth: 08-05-1985 Referring Provider (PT): Dr. Riley Kill   Encounter Date: 11/21/2020   PT End of Session - 11/21/20 1103    Visit Number 5    Number of Visits 7    Date for PT Re-Evaluation 12/14/20    Authorization Type BCBS    PT Start Time 1020    PT Stop Time 1100    PT Time Calculation (min) 40 min    Equipment Utilized During Treatment Gait belt    Activity Tolerance Patient tolerated treatment well    Behavior During Therapy [facility] for tasks assessed/performed;Anxious           Past Medical History:  Diagnosis Date  . 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HCC)   . Chronic hepatitis B (HCC)   . Fixed pupils   . 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HCC)   . Hypertension   . Intracranial injury of other and unspecified nature, without mention of open intracranial wound, unspecified state of consciousness   . Intracranial shunt   . Paralysis (HCC)   . Spastic hemiplegia affecting dominant side (HCC)   . Stroke (HCC)   . Traumatic brain injury (HCC)   . Trouble swallowing   . Visual disturbance   . Weakness     Past Surgical History:  Procedure Laterality Date  . cramiectomy    . CRANIOPLASTY    . REMOVAL OF GASTROSTOMY TUBE    . VENTRICULOPERITONEAL SHUNT      There were no vitals filed for this visit.   Subjective Assessment - 11/21/20 1029    Subjective No falls or chages to note    Patient is accompained by: Family member    Pertinent History Hx of CP    How long can you sit comfortably? unlimited    How long can you stand comfortably? <10 min    How long can you walk comfortably? <5 min    Pain Onset Yesterday                             [facility] Adult PT Treatment/Exercise - 11/21/20 0001       Transfers   Transfers Sit to Stand    Comments performed 5x from solid surface and 10x from [employer] with CGA      Lumbar Exercises: Supine   Clam 15 reps;Limitations    Clam Limitations performed in L sidelie 2x15, 3# resistance    Bridge with [employer] 15 reps;Limitations    Bridge with [employer] Limitations 2x15 with 1.1# ball squeeze    Other Supine Lumbar Exercises supine marching, 3#, 2x15 per LE      Knee/Hip Exercises: Supine   Other Supine Knee/Hip Exercises Knee fallouts with 3#, 2x15      Manual Therapy   Manual Therapy Passive ROM;Muscle Energy Technique    Manual therapy comments 5d DF PROM      Ankle Exercises: Seated   Other Seated Ankle Exercises circles 15x CW/CCW    Other Seated Ankle Exercises light manual resistance to DF 2x15      Ankle Exercises: Sidelying   Ankle Eversion Strengthening;Right;15 reps;Weights;Limitations    Ankle Eversion Weights (lbs) 3#    Ankle Eversion Limitations 2x15  PT Short Term Goals - 10/29/20 0747      PT SHORT TERM GOAL #1   Title STGs = LTGs             PT Long Term Goals - 11/21/20 1106      PT LONG TERM GOAL #1   Title The patient will demonstrate initiation and completion of HEP with supervision from caregiver at home    Baseline HEP established; 11/14/20 HEP reviewed and revised based on needs and deficits, has not been performing consistently    Time 6    Period Weeks    Status On-going      PT LONG TERM GOAL #2   Title Patient to regain 5d R DF PROM    Baseline 0d PROM in R DF; 11/14/20 PROM DF 5d    Time 6    Period Weeks    Status Achieved      PT LONG TERM GOAL #3   Title The patient will ambulate 500' outdoors mod I over varying surfaces and surface heights including grass, curbs, and inclines to demonstrate improved functional mobility with community ambulation.    Baseline 54ft with CGA across level ground; 11/14/20 ambulation of 227ft in clinic to complete functional  testing requiring CGA to prevent falls    Time 6    Period Weeks    Status On-going      PT LONG TERM GOAL #4   Title Assess and establish TUG goal; 11/14/20 goal is 15s    Baseline NT; 11/14/20 TUG time 19.72s    Time 6    Period Weeks    Status Revised      PT LONG TERM GOAL #5   Title Assess and establish DGI goal    Baseline NT; 11/14/20 DGI score 9, goal is 15(prefer to re-assess following orthotic fitting)    Time 6    Period Weeks    Status On-going                 Plan - 11/21/20 1031    Clinical Impression Statement Patient comtinues to present with c/o R peroneal pain but presents with full and painfree PROM in INV/EV, todays session continued to work on LE and hip sterngth and stability assessment of R ankle ROM, apprehensive about WB on foam surfaces, has started hyperextending R knee with gait, R ankle resisted tasks painfree, patiet needs to build confidence and return to prior gym program at [employer] and Comorbidities Comorbidity 1    Comorbidities CP    Examination-Activity Limitations Locomotion Level    Stability/Clinical Decision Making Stable/Uncomplicated    Rehab Potential Good    PT Frequency 1x / week    PT Duration 6 weeks    PT Treatment/Interventions ADLs/Self Care Home Management;Aquatic Therapy;DME Instruction;Gait training;Stair training;Functional mobility training;Therapeutic activities;Therapeutic exercise;Balance training;Neuromuscular re-education;Patient/family education;Orthotic Fit/Training;Manual techniques    PT Next Visit Plan Follow up on orthotic consult and continue to encourage stepping tasks to build confidence in returning to unsupported ambulation, add tasks on foam surfaces    PT Home Exercise Plan [id number]           Patient will benefit from skilled therapeutic intervention in order to improve the following deficits and impairments:  Abnormal gait,Decreased endurance,Decreased activity tolerance,Decreased  strength,Decreased balance,Decreased mobility,Difficulty walking,Increased muscle spasms,Decreased range of motion,Decreased coordination  Visit Diagnosis: Unsteadiness on feet  Muscle weakness (generalized)  Other abnormalities of gait and mobility     Problem List Patient  Active Problem List   Diagnosis Date Noted  . Seizure disorder (HCC) 04/11/2018  . Mechanical low back pain 05/12/2017  . 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 Deafness, sensorineural 04/20/2012  . Buzzing in ear 04/20/2012  . Leaking percutaneous endoscopic gastrostomy (PEG) tube (HCC) 02/04/2012  . 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 Cerebral infarct (HCC) 10/12/2011  . Spastic hemiplegia affecting dominant side (HCC) 10/12/2011  . Dysphagia 10/12/2011  . Functioning G-tube 03/24/2011  . ASPIRATION PNEUMONIA 10/06/2010  . 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 Hemiplegia of dominant side (HCC) 07/28/2010  . CEREBRAL HEMORRHAGE 07/28/2010  . PERSONAL HISTORY OF TRAUMATIC BRAIN INJURY 07/02/2010  . ALLERGIC RHINITIS CAUSE UNSPECIFIED 10/25/2008  . GASTROENTERITIS 08/09/2008  . CONTACT DERMATITIS&OTHER ECZEMA DUE DETERGENTS 08/18/2007  . HEPATITIS B, CHRONIC 06/28/2007    Farris Has Carisha Kantor 11/21/2020, 11:12 AM  [facility] [address] [place], [place], [address] Phone: [phone]   Fax:  [phone]  Name: Paul Kane MRN: [id number] Date of Birth: [birth year]

## 2020-11-27 ENCOUNTER — Ambulatory Visit: Payer: BC Managed Care – PPO

## 2020-11-27 ENCOUNTER — Encounter: Payer: Self-pay | Admitting: Physical Medicine & Rehabilitation

## 2020-11-27 ENCOUNTER — Encounter
Payer: BC Managed Care – PPO | Attending: Physical Medicine & Rehabilitation | Admitting: Physical Medicine & Rehabilitation

## 2020-11-27 ENCOUNTER — Other Ambulatory Visit: Payer: Self-pay

## 2020-11-27 VITALS — BP 106/72 | HR 81 | Temp 97.7°F | Ht 68.0 in | Wt 166.0 lb

## 2020-11-27 DIAGNOSIS — S062X5S 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 pre-existing conscious levels, sequela: Secondary | ICD-10-CM | POA: Diagnosis not present

## 2020-11-27 DIAGNOSIS — G8110 Spastic hemiplegia affecting unspecified side: Secondary | ICD-10-CM | POA: Diagnosis present

## 2020-11-27 NOTE — Progress Notes (Signed)
Subjective:    Patient ID: Paul Kane, male    DOB: [birth year], 35 y.o.   MRN: [id number]  HPI  Paul Kane is here in follow up of his TBI and associated spastic right hemiparesis. We performed dysport injections at his last visit in march focusing on elbow and wrist. He had good results with improvement in his right wrist and forearm positioning.  He has been able to use the hand more for daily activities.  He has his watch on his right wrist now which he is able to use on a daily basis.  It is a smart watch  He has begun to develop some poor habits with his gait.  His current AFO has worn out.  He has been working with physical therapy to address his gait and has had issues with the right leg giving way on him during mobility and with substitution patterns during gait that affect his mechanics.  Pain Inventory Average Pain 5 Pain Right Now 0 My pain is intermittent  In the last 24 hours, has pain interfered with the following? General activity 0 Relation with others 0 Enjoyment of life 0 What TIME of day is your pain at its worst? varies Sleep (in general) Fair  Pain is worse with: some activites Pain improves with: rest Relief from Meds: no pain meds  Family History  Problem Relation Age of Onset  . Diabetes Other   . Hyperlipidemia Other   . Hypertension Other    Social History   Socioeconomic History  . Marital status: Single    Spouse name: Not on file  . Number of children: Not on file  . Years of education: Not on file  . Highest education level: Not on file  Occupational History  . Not on file  Tobacco Use  . Smoking status: Never Smoker  . Smokeless tobacco: Never Used  Vaping Use  . Vaping Use: Never used  Substance and Sexual Activity  . Alcohol use: No  . Drug use: No  . Sexual activity: Never  Other Topics Concern  . Not on file  Social History Narrative  . Not on file   Social Determinants of Health   Financial Resource Strain: Not on file   Food Insecurity: Not on file  Transportation Needs: Not on file  Physical Activity: Not on file  Stress: Not on file  Social Connections: Not on file   Past Surgical History:  Procedure Laterality Date  . cramiectomy    . CRANIOPLASTY    . REMOVAL OF GASTROSTOMY TUBE    . VENTRICULOPERITONEAL SHUNT     Past Surgical History:  Procedure Laterality Date  . cramiectomy    . CRANIOPLASTY    . REMOVAL OF GASTROSTOMY TUBE    . VENTRICULOPERITONEAL SHUNT     Past Medical History:  Diagnosis Date  . 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HCC)   . Chronic hepatitis B (HCC)   . Fixed pupils   . 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HCC)   . Hypertension   . Intracranial injury of other and unspecified nature, without mention of open intracranial wound, unspecified state of consciousness   . Intracranial shunt   . Paralysis (HCC)   . Spastic hemiplegia affecting dominant side (HCC)   . Stroke (HCC)   . Traumatic brain injury (HCC)   . Trouble swallowing   . Visual disturbance   . Weakness    BP 106/72   Pulse 81   Temp 97.7 F (36.5 C)  Ht 5\' 8"  (1.727 m)   Wt 166 lb (75.3 kg)   SpO2 96%   BMI 25.24 kg/m   Opioid Risk Score:   Fall Risk Score:  `1  Depression screen PHQ 2/9  Depression screen [facility] 2/9 10/02/2020 06/05/2020 04/03/2020 01/03/2020 11/29/2017 03/16/2017 09/03/2015  Decreased Interest 0 0 0 0 0 1 1  Down, Depressed, Hopeless 0 0 0 0 0 1 1  PHQ - 2 Score 0 0 0 0 0 2 2  Altered sleeping - - - 0 - - -  Tired, decreased energy - - - 0 - - -  Change in appetite - - - 0 - - -  Feeling bad or failure about yourself  - - - 0 - - -  Trouble concentrating - - - 0 - - -  Moving slowly or fidgety/restless - - - 0 - - -  Suicidal thoughts - - - 0 - - -  PHQ-9 Score - - - 0 - - -  Some recent data might be hidden   Review of Systems  Constitutional: Negative.   HENT: Negative.   Eyes: Negative.   Respiratory: Negative.   Cardiovascular: Negative.   Gastrointestinal:  Negative.   Endocrine: Negative.   Genitourinary: Negative.   Musculoskeletal: Positive for gait problem.  Skin: Negative.   Allergic/Immunologic: Negative.   Neurological: Positive for speech difficulty.  Hematological: Negative.   Psychiatric/Behavioral: Negative.   All other systems reviewed and are negative.      Objective:   Physical Exam Gen: no distress, normal appearing HEENT: oral mucosa pink and moist, NCAT Cardio: Reg rate Chest: normal effort, normal rate of breathing Abd: soft, non-distended Ext: no edema Psych: pleasant, normal affect Skin: intact Neuro: Patient is alert and appropriate.  Speech is dysarthric.  Spastic right hemiparesis. RUE improved with 1/4 tone biceps and brachioradialis.  Tone is trace to 1 out of 4 in pronator teres.  Right lower extremity tone is trace to 1 out of 4 extensor pattern.  Tends to walk by circumducting the right leg.  Foot goes into a bit of a equinovarus posture during swing phase on the right. Musculoskeletal: No pain with passive or active range of motion today in the upper or lower extremities.       Assessment & Plan:  ASSESSMENT:   1. History of traumatic brain injury with hydrocephalus, meningitis,   and right pontine stroke.   2. Spastic tetraplegia, dominant side more affected 3. severe oropharyngeal dysphagia--improved--tolerating diet well.  Continues on thickened liquids 4. Absence seizures 5. Mechanical Low back pain: due to posture/altered gait patterns       PLAN:   1.  Paul Kane has significant right, (dominant side) spastic hemiplegia. He requires a new right AFO to improve his mobility and the ability to ambulate safely to maintain a healthy and active lifestyle. He verbalizes a strong desire to get a new AFO to assist with foot clearance, knee stability, and prevention of falls. He is currently in PT and does not use assisted devices but does fall often. He has an older AFO which 43+ years old which is ill  fitting and does not accommodate current alignment and contours. The patient exihbits the strength, motivation, potential, and ability to ambulate with an AFO to prevent progression of deformity, provide toe clearance, and keep him as independent as possible.  2.      Spasticity has improved after Dysport injections.  We will not plan further injections at this point.  Continue with home exercise program and aggressive range of motion. 3.  Vimpat and keppra for seizure  4. tylenol or voltaren for pain  Fifteen minutes of face to face patient care time were spent during this visit. All questions were encouraged and answered.  Follow up with me in 3 mos .

## 2020-11-27 NOTE — Patient Instructions (Signed)
PLEASE FEEL FREE TO CALL OUR OFFICE WITH ANY PROBLEMS OR QUESTIONS ([phone])      

## 2020-11-28 ENCOUNTER — Ambulatory Visit: Payer: BC Managed Care – PPO

## 2020-11-28 DIAGNOSIS — R2681 Unsteadiness on feet: Secondary | ICD-10-CM | POA: Diagnosis not present

## 2020-11-28 DIAGNOSIS — M6281 Muscle weakness (generalized): Secondary | ICD-10-CM

## 2020-11-28 NOTE — Therapy (Signed)
[facility] [address] [place], [place], [address] Phone: [phone]   Fax:  [phone]  Physical Therapy Treatment  Patient Details  Name: Paul Kane MRN: [id number] Date of Birth: [birth year] Referring Provider (PT): Dr. Riley Kill   Encounter Date: 11/28/2020   PT End of Session - 11/28/20 1103    Visit Number 6    Number of Visits 7    Date for PT Re-Evaluation 12/14/20    Authorization Type BCBS    PT Start Time 1015    PT Stop Time 1100    PT Time Calculation (min) 45 min    Equipment Utilized During Treatment Gait belt    Activity Tolerance Patient tolerated treatment well    Behavior During Therapy [facility] for tasks assessed/performed;Anxious           Past Medical History:  Diagnosis Date  . 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HCC)   . Chronic hepatitis B (HCC)   . Fixed pupils   . 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HCC)   . Hypertension   . Intracranial injury of other and unspecified nature, without mention of open intracranial wound, unspecified state of consciousness   . Intracranial shunt   . Paralysis (HCC)   . Spastic hemiplegia affecting dominant side (HCC)   . Stroke (HCC)   . Traumatic brain injury (HCC)   . Trouble swallowing   . Visual disturbance   . Weakness     Past Surgical History:  Procedure Laterality Date  . cramiectomy    . CRANIOPLASTY    . REMOVAL OF GASTROSTOMY TUBE    . VENTRICULOPERITONEAL SHUNT      There were no vitals filed for this visit.   Subjective Assessment - 11/28/20 1020    Subjective No falls or changes to note, arrives with R knee brace which he has been wearing since yesterday    Patient is accompained by: Family member    Pertinent History Hx of CP    How long can you sit comfortably? unlimited    How long can you stand comfortably? <10 min    How long can you walk comfortably? <5 min    Currently in Pain? No/denies    Pain Onset Paul Kane PT Assessment - 11/28/20 0001      Ambulation/Gait   Stairs Yes    Stairs Assistance 5: Supervision    Stair Management Technique One rail Left;Alternating pattern;Forwards    Number of Stairs 16    Height of Stairs 6      Timed Up and Go Test   Normal TUG (seconds) 17.93            Todays session proceeded with AFO training with off the shelf AFO donning and doffing both AFO and knee brace and ensuring proper fit               [facility] Adult PT Treatment/Exercise - 11/28/20 0001      Transfers   Transfers Sit to Stand    Comments 10x from [employer] with SBA      Ambulation/Gait   Ambulation/Gait Assistance 5: Supervision    Ambulation Distance (Feet) 230 Feet    Assistive device None    Gait Pattern Abducted - left;Abducted- right;Wide base of support;Decreased trunk rotation;Ataxic;Right genu recurvatum      Ankle Exercises: Seated   Other Seated Ankle Exercises resisted ankle circles  Ankle Exercises: Standing   Heel Raises Both;15 reps;Other (comment)    Heel Raises Limitations performed with HHA                    PT Short Term Goals - 10/29/20 0747      PT SHORT TERM GOAL #1   Title STGs = LTGs             PT Long Term Goals - 11/21/20 1106      PT LONG TERM GOAL #1   Title The patient will demonstrate initiation and completion of HEP with supervision from caregiver at home    Baseline HEP established; 11/14/20 HEP reviewed and revised based on needs and deficits, has not been performing consistently    Time 6    Period Weeks    Status On-going      PT LONG TERM GOAL #2   Title Patient to regain 5d R DF PROM    Baseline 0d PROM in R DF; 11/14/20 PROM DF 5d    Time 6    Period Weeks    Status Achieved      PT LONG TERM GOAL #3   Title The patient will ambulate 500' outdoors mod I over varying surfaces and surface heights including grass, curbs, and inclines to demonstrate improved functional mobility with  community ambulation.    Baseline 25ft with CGA across level ground; 11/14/20 ambulation of 267ft in clinic to complete functional testing requiring CGA to prevent falls    Time 6    Period Weeks    Status On-going      PT LONG TERM GOAL #4   Title Assess and establish TUG goal; 11/14/20 goal is 15s    Baseline NT; 11/14/20 TUG time 19.72s    Time 6    Period Weeks    Status Revised      PT LONG TERM GOAL #5   Title Assess and establish DGI goal    Baseline NT; 11/14/20 DGI score 9, goal is 15(prefer to re-assess following orthotic fitting)    Time 6    Period Weeks    Status On-going                 Plan - 11/28/20 1104    Clinical Impression Statement Arrives for session wearing R knee stabilizing brace, wearing since yesterday, CG reports he walks better with it.  Removed brace for session and donned R AFO to assess gait, stairs and TUG, able to negotiate full flight of stairs with L rail, step through pattern under S, TUG time slightly improved and less supination observed when ambulating.  Patient is a good candiate for an AFO and is awaiting fitting,  Recommend returning to OPPT once brace has been fitted.    Personal Factors and Comorbidities Comorbidity 1    Comorbidities CP    Examination-Activity Limitations Locomotion Level    Stability/Clinical Decision Making Stable/Uncomplicated    Rehab Potential Good    PT Frequency 1x / week    PT Duration 6 weeks    PT Treatment/Interventions ADLs/Self Care Home Management;Aquatic Therapy;DME Instruction;Gait training;Stair training;Functional mobility training;Therapeutic activities;Therapeutic exercise;Balance training;Neuromuscular re-education;Patient/family education;Orthotic Fit/Training;Manual techniques    PT Next Visit Plan Assess gait, goals and DGI once fitted with AFO    PT Home Exercise Plan [id number]           Patient will benefit from skilled therapeutic intervention in order to improve the following deficits  and impairments:  Abnormal  gait,Decreased endurance,Decreased activity tolerance,Decreased strength,Decreased balance,Decreased mobility,Difficulty walking,Increased muscle spasms,Decreased range of motion,Decreased coordination  Visit Diagnosis: Unsteadiness on feet  Muscle weakness (generalized)     Problem List Patient Active Problem List   Diagnosis Date Noted  . Seizure disorder (HCC) 04/11/2018  . Mechanical low back pain 05/12/2017  . 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 Deafness, sensorineural 04/20/2012  . Buzzing in ear 04/20/2012  . Leaking percutaneous endoscopic gastrostomy (PEG) tube (HCC) 02/04/2012  . 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 Cerebral infarct (HCC) 10/12/2011  . Spastic hemiplegia affecting dominant side (HCC) 10/12/2011  . Dysphagia 10/12/2011  . Functioning G-tube 03/24/2011  . ASPIRATION PNEUMONIA 10/06/2010  . 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 Hemiplegia of dominant side (HCC) 07/28/2010  . CEREBRAL HEMORRHAGE 07/28/2010  . PERSONAL HISTORY OF TRAUMATIC BRAIN INJURY 07/02/2010  . ALLERGIC RHINITIS CAUSE UNSPECIFIED 10/25/2008  . GASTROENTERITIS 08/09/2008  . CONTACT DERMATITIS&OTHER ECZEMA DUE DETERGENTS 08/18/2007  . HEPATITIS B, CHRONIC 06/28/2007    Farris Has Orabelle Rylee PT 11/28/2020, 11:13 AM  [facility] [address] [place], [place], [address] Phone: [phone]   Fax:  [phone]  Name: Paul Kane MRN: [id number] Date of Birth: 02/26/1986

## 2020-12-04 ENCOUNTER — Ambulatory Visit: Payer: BC Managed Care – PPO

## 2020-12-06 ENCOUNTER — Ambulatory Visit: Payer: BC Managed Care – PPO

## 2021-01-01 ENCOUNTER — Ambulatory Visit: Payer: BC Managed Care – PPO | Admitting: Physical Medicine & Rehabilitation

## 2021-01-28 ENCOUNTER — Telehealth: Payer: Self-pay | Admitting: Physical Medicine & Rehabilitation

## 2021-01-28 DIAGNOSIS — G8110 Spastic hemiplegia affecting unspecified side: Secondary | ICD-10-CM

## 2021-01-28 DIAGNOSIS — S062X5S 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 pre-existing conscious levels, sequela: Secondary | ICD-10-CM

## 2021-01-28 NOTE — Telephone Encounter (Signed)
Needs a new RX for PT

## 2021-02-07 NOTE — Addendum Note (Signed)
Addended by: Faith Rogue T on: 02/07/2021 01:06 PM   Modules accepted: Orders

## 2021-02-07 NOTE — Telephone Encounter (Signed)
Order in.

## 2021-02-26 ENCOUNTER — Ambulatory Visit: Payer: BC Managed Care – PPO | Admitting: Physical Medicine & Rehabilitation

## 2021-03-12 ENCOUNTER — Encounter (HOSPITAL_COMMUNITY): Payer: Self-pay | Admitting: *Deleted

## 2021-03-12 ENCOUNTER — Emergency Department (HOSPITAL_COMMUNITY): Payer: BC Managed Care – PPO

## 2021-03-12 ENCOUNTER — Observation Stay (HOSPITAL_COMMUNITY)
Admission: EM | Admit: 2021-03-12 | Discharge: 2021-03-15 | Disposition: A | Discharge status: Home Health | Payer: BC Managed Care – PPO | Attending: Internal Medicine | Admitting: Internal Medicine

## 2021-03-12 ENCOUNTER — Observation Stay (HOSPITAL_COMMUNITY): Payer: BC Managed Care – PPO

## 2021-03-12 DIAGNOSIS — Z79899 Other long term (current) drug therapy: Secondary | ICD-10-CM | POA: Insufficient documentation

## 2021-03-12 DIAGNOSIS — D6959 Other secondary thrombocytopenia: Secondary | ICD-10-CM | POA: Diagnosis present

## 2021-03-12 DIAGNOSIS — B338 Other specified viral diseases: Secondary | ICD-10-CM | POA: Diagnosis present

## 2021-03-12 DIAGNOSIS — R29818 Other symptoms and signs involving the nervous system: Secondary | ICD-10-CM

## 2021-03-12 DIAGNOSIS — Z20822 Contact with and (suspected) exposure to covid-19: Secondary | ICD-10-CM | POA: Diagnosis not present

## 2021-03-12 DIAGNOSIS — E869 Volume depletion, unspecified: Secondary | ICD-10-CM | POA: Diagnosis present

## 2021-03-12 DIAGNOSIS — K5900 Constipation, unspecified: Secondary | ICD-10-CM | POA: Diagnosis present

## 2021-03-12 DIAGNOSIS — G40909 Epilepsy, unspecified, not intractable, without status epilepticus: Secondary | ICD-10-CM

## 2021-03-12 DIAGNOSIS — I69351 Hemiplegia and hemiparesis following cerebral infarction affecting right dominant side: Secondary | ICD-10-CM

## 2021-03-12 DIAGNOSIS — R55 Syncope and collapse: Secondary | ICD-10-CM | POA: Diagnosis present

## 2021-03-12 DIAGNOSIS — B1910 Unspecified viral hepatitis B without hepatic coma: Secondary | ICD-10-CM | POA: Diagnosis not present

## 2021-03-12 DIAGNOSIS — Z87820 Personal history of traumatic brain injury: Secondary | ICD-10-CM

## 2021-03-12 DIAGNOSIS — B181 Chronic viral hepatitis B without delta-agent: Secondary | ICD-10-CM | POA: Diagnosis not present

## 2021-03-12 DIAGNOSIS — Z982 Presence of cerebrospinal fluid drainage device: Secondary | ICD-10-CM

## 2021-03-12 DIAGNOSIS — S069XAA Unspecified intracranial injury with loss of consciousness status unknown, initial encounter: Secondary | ICD-10-CM | POA: Diagnosis present

## 2021-03-12 DIAGNOSIS — R509 Fever, unspecified: Secondary | ICD-10-CM

## 2021-03-12 DIAGNOSIS — A419 Sepsis, unspecified organism: Secondary | ICD-10-CM | POA: Diagnosis present

## 2021-03-12 DIAGNOSIS — R1310 Dysphagia, unspecified: Secondary | ICD-10-CM

## 2021-03-12 DIAGNOSIS — E872 Acidosis, unspecified: Secondary | ICD-10-CM | POA: Diagnosis present

## 2021-03-12 DIAGNOSIS — Z8249 Family history of ischemic heart disease and other diseases of the circulatory system: Secondary | ICD-10-CM

## 2021-03-12 DIAGNOSIS — I10 Essential (primary) hypertension: Secondary | ICD-10-CM | POA: Diagnosis not present

## 2021-03-12 DIAGNOSIS — R6510 Systemic inflammatory response syndrome (SIRS) of non-infectious origin without acute organ dysfunction: Secondary | ICD-10-CM | POA: Diagnosis not present

## 2021-03-12 DIAGNOSIS — E876 Hypokalemia: Secondary | ICD-10-CM

## 2021-03-12 DIAGNOSIS — S069X9A Unspecified intracranial injury with loss of consciousness of unspecified duration, initial encounter: Secondary | ICD-10-CM

## 2021-03-12 DIAGNOSIS — G8111 Spastic hemiplegia affecting right dominant side: Secondary | ICD-10-CM

## 2021-03-12 DIAGNOSIS — Z833 Family history of diabetes mellitus: Secondary | ICD-10-CM

## 2021-03-12 DIAGNOSIS — I9589 Other hypotension: Secondary | ICD-10-CM | POA: Diagnosis present

## 2021-03-12 DIAGNOSIS — R5381 Other malaise: Secondary | ICD-10-CM | POA: Diagnosis present

## 2021-03-12 LAB — COMPREHENSIVE METABOLIC PANEL
ALT: 37 U/L (ref 0–44)
AST: 34 U/L (ref 15–41)
Albumin: 3.6 g/dL (ref 3.5–5.0)
Alkaline Phosphatase: 39 U/L (ref 38–126)
Anion gap: 8 (ref 5–15)
BUN: 9 mg/dL (ref 6–20)
CO2: 25 mmol/L (ref 22–32)
Calcium: 8.4 mg/dL — ABNORMAL LOW (ref 8.9–10.3)
Chloride: 99 mmol/L (ref 98–111)
Creatinine, Ser: 0.98 mg/dL (ref 0.61–1.24)
GFR, Estimated: >60 mL/min (ref >60)
Glucose, Bld: 126 mg/dL — ABNORMAL HIGH (ref 70–99)
Potassium: 3.4 mmol/L — ABNORMAL LOW (ref 3.5–5.1)
Sodium: 132 mmol/L — ABNORMAL LOW (ref 135–145)
Total Bilirubin: 0.9 mg/dL (ref 0.3–1.2)
Total Protein: 7.1 g/dL (ref 6.5–8.1)

## 2021-03-12 LAB — URINALYSIS, ROUTINE W REFLEX MICROSCOPIC
Bacteria, UA: NONE SEEN
Bilirubin Urine: NEGATIVE
Glucose, UA: NEGATIVE mg/dL
Ketones, ur: NEGATIVE mg/dL
Leukocytes,Ua: NEGATIVE
Nitrite: NEGATIVE
Protein, ur: NEGATIVE mg/dL
Specific Gravity, Urine: 1.006 (ref 1.005–1.030)
pH: 6 (ref 5.0–8.0)

## 2021-03-12 LAB — CBC WITH DIFFERENTIAL/PLATELET
Abs Immature Granulocytes: 0.04 10*3/uL (ref 0.00–0.07)
Basophils Absolute: 0 10*3/uL (ref 0.0–0.1)
Basophils Relative: 0 %
Eosinophils Absolute: 0 10*3/uL (ref 0.0–0.5)
Eosinophils Relative: 0 %
HCT: 42 % (ref 39.0–52.0)
Hemoglobin: 14.1 g/dL (ref 13.0–17.0)
Immature Granulocytes: 1 %
Lymphocytes Relative: 6 %
Lymphs Abs: 0.5 10*3/uL — ABNORMAL LOW (ref 0.7–4.0)
MCH: 30.3 pg (ref 26.0–34.0)
MCHC: 33.6 g/dL (ref 30.0–36.0)
MCV: 90.1 fL (ref 80.0–100.0)
Monocytes Absolute: 0.8 10*3/uL (ref 0.1–1.0)
Monocytes Relative: 10 %
Neutro Abs: 6.7 10*3/uL (ref 1.7–7.7)
Neutrophils Relative %: 83 %
Platelets: 108 10*3/uL — ABNORMAL LOW (ref 150–400)
RBC: 4.66 MIL/uL (ref 4.22–5.81)
RDW: 12.4 % (ref 11.5–15.5)
WBC: 8 10*3/uL (ref 4.0–10.5)
nRBC: 0 % (ref 0.0–0.2)

## 2021-03-12 LAB — LACTIC ACID, PLASMA
Lactic Acid, Venous: 2.2 mmol/L (ref 0.5–1.9)
Lactic Acid, Venous: 1.7 mmol/L (ref 0.5–1.9)

## 2021-03-12 LAB — TROPONIN I (HIGH SENSITIVITY)
Troponin I (High Sensitivity): 8 ng/L (ref <18)
Troponin I (High Sensitivity): 7 ng/L (ref <18)

## 2021-03-12 LAB — RESP PANEL BY RT-PCR (FLU A&B, COVID) ARPGX2
Influenza A by PCR: NEGATIVE
Influenza B by PCR: NEGATIVE
SARS Coronavirus 2 by RT PCR: NEGATIVE

## 2021-03-12 MED ORDER — POLYETHYLENE GLYCOL 3350 17 G PO PACK: 17.0000 g | PACK | Freq: Every day | ORAL | Status: DC | PRN

## 2021-03-12 MED ORDER — BISACODYL 10 MG RE SUPP
10.0000 mg | Freq: Once | RECTAL | Status: AC
  Administered 2021-03-13: 10 mg via RECTAL
  Filled 2021-03-12: qty 1

## 2021-03-12 MED ORDER — LEVETIRACETAM 100 MG/ML PO SOLN
250.0000 mg | Freq: Two times a day (BID) | ORAL | Status: DC
  Administered 2021-03-13 – 2021-03-15 (×6): 250 mg via ORAL
  Filled 2021-03-12 (×6): qty 5

## 2021-03-12 MED ORDER — LORATADINE 10 MG PO TABS: 10.0000 mg | ORAL_TABLET | Freq: Every day | ORAL | Status: DC | PRN

## 2021-03-12 MED ORDER — ONDANSETRON HCL 4 MG PO TABS: 4.0000 mg | ORAL_TABLET | Freq: Four times a day (QID) | ORAL | Status: DC | PRN

## 2021-03-12 MED ORDER — IOHEXOL 350 MG/ML SOLN
75.0000 mL | Freq: Once | INTRAVENOUS | Status: AC | PRN
  Administered 2021-03-12: 75 mL via INTRAVENOUS

## 2021-03-12 MED ORDER — ENOXAPARIN SODIUM 40 MG/0.4ML IJ SOSY
40.0000 mg | PREFILLED_SYRINGE | INTRAMUSCULAR | Status: DC
  Administered 2021-03-13 – 2021-03-14 (×3): 40 mg via SUBCUTANEOUS
  Filled 2021-03-12 (×3): qty 0.4

## 2021-03-12 MED ORDER — VANCOMYCIN HCL 1500 MG/300ML IV SOLN
1500.0000 mg | Freq: Once | INTRAVENOUS | Status: AC
  Administered 2021-03-12: 1500 mg via INTRAVENOUS
  Filled 2021-03-12: qty 300

## 2021-03-12 MED ORDER — ACETAMINOPHEN 500 MG PO TABS: 500.0000 mg | ORAL_TABLET | Freq: Once | ORAL | Status: DC

## 2021-03-12 MED ORDER — ONDANSETRON HCL 4 MG/2ML IJ SOLN: 4.0000 mg | Freq: Four times a day (QID) | INTRAMUSCULAR | Status: DC | PRN

## 2021-03-12 MED ORDER — POTASSIUM CHLORIDE 20 MEQ PO PACK
40.0000 meq | PACK | Freq: Once | ORAL | Status: AC
  Administered 2021-03-13: 40 meq via ORAL
  Filled 2021-03-12: qty 2

## 2021-03-12 MED ORDER — ACETAMINOPHEN 160 MG/5ML PO SOLN
500.0000 mg | Freq: Once | ORAL | Status: AC
  Administered 2021-03-12: 500 mg via ORAL
  Filled 2021-03-12: qty 20.3

## 2021-03-12 MED ORDER — SODIUM CHLORIDE 0.9 % IV SOLN
2.0000 g | Freq: Once | INTRAVENOUS | Status: AC
  Administered 2021-03-12: 2 g via INTRAVENOUS
  Filled 2021-03-12: qty 2

## 2021-03-12 MED ORDER — LACTATED RINGERS IV SOLN: INTRAVENOUS | Status: AC

## 2021-03-12 MED ORDER — VANCOMYCIN HCL IN DEXTROSE 1-5 GM/200ML-% IV SOLN
1000.0000 mg | Freq: Two times a day (BID) | INTRAVENOUS | Status: DC
  Administered 2021-03-13 – 2021-03-14 (×4): 1000 mg via INTRAVENOUS
  Filled 2021-03-12 (×5): qty 200

## 2021-03-12 MED ORDER — SODIUM CHLORIDE 0.9 % IV SOLN
2.0000 g | Freq: Three times a day (TID) | INTRAVENOUS | Status: DC
  Administered 2021-03-13 – 2021-03-15 (×7): 2 g via INTRAVENOUS
  Filled 2021-03-12 (×8): qty 2

## 2021-03-12 MED ORDER — LACOSAMIDE 10 MG/ML PO SOLN
200.0000 mg | Freq: Two times a day (BID) | ORAL | Status: DC
  Administered 2021-03-13: 200 mg via ORAL
  Filled 2021-03-12 (×3): qty 20

## 2021-03-12 MED ORDER — ACETAMINOPHEN 325 MG PO TABS
650.0000 mg | ORAL_TABLET | Freq: Four times a day (QID) | ORAL | Status: DC | PRN
  Administered 2021-03-13 – 2021-03-14 (×2): 650 mg via ORAL
  Filled 2021-03-12 (×2): qty 2

## 2021-03-12 MED ORDER — SODIUM CHLORIDE 0.9 % IV BOLUS
1000.0000 mL | Freq: Once | INTRAVENOUS | Status: AC
  Administered 2021-03-12: 1000 mL via INTRAVENOUS

## 2021-03-12 MED ORDER — SODIUM FLUORIDE 1.1 % DT CREA: 1.0000 "application " | TOPICAL_CREAM | Freq: Every day | DENTAL | Status: DC

## 2021-03-12 MED ORDER — ACETAMINOPHEN 650 MG RE SUPP
650.0000 mg | Freq: Four times a day (QID) | RECTAL | Status: DC | PRN
  Administered 2021-03-12: 650 mg via RECTAL
  Filled 2021-03-12: qty 1

## 2021-03-12 NOTE — Progress Notes (Signed)
Pharmacy Antibiotic Note  Paul Kane is a 35 y.o. male admitted on 03/12/2021 with sepsis.  Pharmacy has been consulted for vancomycin and cefepime dosing.  Patient with a history of intracranial shunt status post TBI, hypertension, cerebral thrombosis with cerebral infarction, chronic hepatitis B, paralysis, spastic hemiplegia affecting dominant side. Patient presented following syncopal episode in the home.  SCr 0.98; LA 2.2 >> 1.7  Plan: Cefepime 2g q8h Vancomycin 1500mg  once then 1000mg  q12h (eAUC 511.5) AUC dosing utilized for vancomycin, may require adjustment if shunt/CNS felt to be source, though this is not primary concern at this time Monitor renal function Monitor cultures De-escalate antibiotics when appropriate     Temp (24hrs), Avg:99.3 F (37.4 C), Min:98.5 F (36.9 C), Max:100.4 F (38 C)  Recent Labs  Lab 03/12/21 0932 03/12/21 1751  WBC 8.0  --   CREATININE 0.98  --   LATICACIDVEN  --  2.2*    CrCl cannot be calculated (Unknown ideal weight.).    No Known Allergies  Antimicrobials this admission: cefepime 8/24 >>  vancomycin 8/24 >>   Microbiology results: pending  Thank you for allowing pharmacy to be a part of this patient's care.  9/24 03/12/2021 7:36 PM

## 2021-03-12 NOTE — Sepsis Progress Note (Signed)
Following for sepsis monitoring ?

## 2021-03-12 NOTE — ED Notes (Signed)
Pt gone to CT 

## 2021-03-12 NOTE — ED Notes (Signed)
Pt transported to Xray. 
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
## 2021-03-12 NOTE — ED Provider Notes (Signed)
Emergency Medicine Provider Triage Evaluation Note  Paul Kane , a 35 y.o. male  was evaluated in triage.  Pt complains of syncope witnessed by father today.  Reports patient was brushing his teeth when he suddenly fell backwards striking his head.  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  There is also a shunt in place.  Patient not complaining of any pain, back to baseline.  Review of Systems  Positive: syncope Negative: Chest pain, shortness of breath, vomiting   Physical Exam  BP 92/69   Pulse 92   Temp 98.8 F (37.1 C) (Oral)   Resp 15   SpO2 99%  Gen:   Awake, no distress   Resp:  Normal effort  MSK:   Moves extremities without difficulty  Other:  Limited mobility to the right arm at baseline.  Pupils are equal and reactive.  No visible or palpable goose egg in the back of his head.  No midline cervical tenderness.  Medical Decision Making  Medically screening exam initiated at 9:29 AM.  Appropriate orders placed.  RAN TULLIS was informed that the remainder of the evaluation will be completed by another provider, this initial triage assessment does not replace that evaluation, and the importance of remaining in the ED until their evaluation is complete.  Patient with a prior history of TBI, with syncope witnessed by father, was on the ground for approximately 30 seconds.  Currently not on blood thinners.  Concern for intra cranial pathology, as he had a mechanical fall yesterday.  In addition, he does have a shunt in place.  Labs along with imaging have been ordered.   Claude Manges, PA-C 03/12/21 [id number]    Mancel Bale, MD 03/12/21 [phone]

## 2021-03-12 NOTE — ED Notes (Signed)
Pt father stated pt has aphasia and is not able to swallow water RN made aware.

## 2021-03-12 NOTE — ED Triage Notes (Signed)
Pt has hx of TBI, had a mechanical fall yesterday followed by a witnessed syncopal episode this am, lasting approx 30 sec. No specific complaints today.

## 2021-03-12 NOTE — ED Provider Notes (Addendum)
[facility] EMERGENCY DEPARTMENT Provider Note   CSN: [id number] Arrival date & time: 03/12/21  0920     History Chief Complaint  Patient presents with   Loss of Consciousness    Paul Kane is a 34 y.o. male with past medical history of intracranial shunt status post TBI, hypertension, cerebral thrombosis with cerebral infarction, chronic hepatitis B, paralysis, spastic hemiplegia affecting dominant side the presents emerged department today for syncopal episode, history provided mostly by dad.  Patient is somewhat verbal and able to walk with assistance, dad states that he is currently neurologically at baseline.  Dad states that he was brushing his teeth this morning and then was wavering around and then syncopized, states that he was unconscious for about a minute.  Dad denies any seizure like activity, patient does have a history of seizures and has been compliant with seizure medications.  Dad states this is not how his seizures normally present, sees [facility] for neurology.  Dad states that for the past 8 months he has been declining, has been falling a lot more than normal.  Denies any syncopal episodes until today.  Denies any abnormalities this morning, denies any fevers, vomiting, diarrhea, patient was not complaining of any pain.  Patient did have a fever today at 100.5, did resolve without any medications.Marland Kitchen  HPI     Past Medical History:  Diagnosis Date   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HCC)    Chronic hepatitis B (HCC)    Fixed pupils    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HCC)    Hypertension    Intracranial injury of other and unspecified nature, without mention of open intracranial wound, unspecified state of consciousness    Intracranial shunt    Paralysis (HCC)    Spastic hemiplegia affecting dominant side (HCC)    Stroke (HCC)    Traumatic brain injury (HCC)    Trouble swallowing    Visual disturbance    Weakness     Patient Active Problem  List   Diagnosis Date Noted   SIRS (systemic inflammatory response syndrome) (HCC) 03/12/2021   Seizure disorder (HCC) 04/11/2018   Mechanical low back pain 05/12/2017   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/20/2012   Deafness, sensorineural 04/20/2012   Buzzing in ear 04/20/2012   Leaking percutaneous endoscopic gastrostomy (PEG) tube (HCC) 02/04/2012   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 pre-existing conscious levels, sequela (HCC) 10/12/2011   Cerebral infarct (HCC) 10/12/2011   Spastic hemiplegia affecting dominant side (HCC) 10/12/2011   Dysphagia 10/12/2011   Functioning G-tube 03/24/2011   ASPIRATION PNEUMONIA 10/06/2010   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   Hemiplegia of dominant side (HCC) 07/28/2010   CEREBRAL HEMORRHAGE 07/28/2010   PERSONAL HISTORY OF TRAUMATIC BRAIN INJURY 07/02/2010   ALLERGIC RHINITIS CAUSE UNSPECIFIED 10/25/2008   GASTROENTERITIS 08/09/2008   CONTACT DERMATITIS&OTHER ECZEMA DUE DETERGENTS 08/18/2007   HEPATITIS B, CHRONIC 06/28/2007    Past Surgical History:  Procedure Laterality Date   cramiectomy     CRANIOPLASTY     REMOVAL OF GASTROSTOMY TUBE     VENTRICULOPERITONEAL SHUNT         Family History  Problem Relation Age of Onset   Diabetes Other    Hyperlipidemia Other    Hypertension Other     Social History   Tobacco Use   Smoking status: Never   Smokeless tobacco: Never  Vaping Use   Vaping Use: Never used  Substance Use Topics  Alcohol use: No   Drug use: No    Home Medications Prior to Admission medications   Medication Sig Start Date End Date Taking? Authorizing Provider  Cholecalciferol (VITAMIN D-3) 25 MCG (1000 UT) CAPS Take 1,000-2,000 Units by mouth daily.   Yes [provider]  [employer] INSTANT FOOD THICKENER POWD TAKE AS DIRECTED Patient taking differently: as directed. 10/31/18  Yes Ranelle Oyster, MD  DENTA 5000 PLUS 1.1 % CREA dental cream Place 1  application onto teeth at bedtime. 06/05/20  Yes [provider]  KEPPRA 100 MG/ML solution Take 250 mg by mouth 2 (two) times daily.   Yes [provider]  loratadine (CLARITIN) 10 MG tablet Take 10 mg by mouth daily as needed for allergies or rhinitis.   Yes [provider]  VIMPAT 10 MG/ML oral solution Take 200 mg by mouth 2 (two) times daily.   Yes [provider]  lacosamide (VIMPAT) 10 MG/ML oral solution Take 20 mLs (200 mg total) by mouth 2 (two) times daily. Patient not taking: No sig reported 04/11/18   Ranelle Oyster, MD    Allergies    Patient has no known allergies.  Review of Systems   Review of Systems  Unable to perform ROS: Patient nonverbal   Physical Exam Updated Vital Signs BP (!) 98/56   Pulse (!) 102   Temp 99.6 F (37.6 C) (Oral)   Resp (!) 28   SpO2 96%   Physical Exam Constitutional:      General: He is not in acute distress.    Appearance: Normal appearance. He is not ill-appearing, toxic-appearing or diaphoretic.  HENT:     Head: Normocephalic and atraumatic.  Cardiovascular:     Rate and Rhythm: Normal rate and regular rhythm.     Pulses: Normal pulses.  Pulmonary:     Effort: Pulmonary effort is normal.     Breath sounds: Normal breath sounds.  Musculoskeletal:        General: Normal range of motion.  Skin:    General: Skin is warm and dry.     Capillary Refill: Capillary refill takes less than 2 seconds.  Neurological:     General: No focal deficit present.     Mental Status: He is alert. Mental status is at baseline.     Comments: Right hand is contracted, normal left upper extremity.  Lower extremities with normal range of motion.  Patient is able to follow commands well, however nonverbal.  EOMs intact, appears coherent to the situation.  Psychiatric:        Mood and Affect: Mood normal.        Behavior: Behavior normal.        Thought Content: Thought content normal.    ED Results / Procedures  / Treatments   Labs (all labs ordered are listed, but only abnormal results are displayed) Labs Reviewed  CBC WITH DIFFERENTIAL/PLATELET - Abnormal; Notable for the following components:      Result Value   Platelets 108 (*)    Lymphs Abs 0.5 (*)    All other components within normal limits  COMPREHENSIVE METABOLIC PANEL - Abnormal; Notable for the following components:   Sodium 132 (*)    Potassium 3.4 (*)    Glucose, Bld 126 (*)    Calcium 8.4 (*)    All other components within normal limits  URINALYSIS, ROUTINE W REFLEX MICROSCOPIC - Abnormal; Notable for the following components:   Hgb urine dipstick MODERATE (*)  All other components within normal limits  LACTIC ACID, PLASMA - Abnormal; Notable for the following components:   Lactic Acid, Venous 2.2 (*)    All other components within normal limits  RESP PANEL BY RT-PCR (FLU A&B, COVID) ARPGX2  CULTURE, BLOOD (ROUTINE X 2)  CULTURE, BLOOD (ROUTINE X 2)  GROUP A STREP BY PCR  LACTIC ACID, PLASMA  LEVETIRACETAM LEVEL  C-REACTIVE PROTEIN  PROCALCITONIN  TROPONIN I (HIGH SENSITIVITY)  TROPONIN I (HIGH SENSITIVITY)    EKG EKG Interpretation  Date/Time:  Wednesday March 12 2021 09:40:30 EDT Ventricular Rate:  91 PR Interval:  134 QRS Duration: 90 QT Interval:  330 QTC Calculation: 405 R Axis:   63 Text Interpretation: Normal sinus rhythm Normal ECG Confirmed by Kristine Royal [phone]) on 03/12/2021 5:20:07 PM  Radiology DG Chest 2 View  Result Date: 03/12/2021 CLINICAL DATA:  Syncope. EXAM: CHEST - 2 VIEW COMPARISON:  Chest radiograph 11/28/2016 FINDINGS: The heart size and mediastinal contours are within normal limits. Both lungs are clear. The visualized skeletal structures are unremarkable. Stable elevation of the right hemidiaphragm. Partially visualized shunt catheter tubing overlies the anterior right chest wall and right upper abdomen. IMPRESSION: No active cardiopulmonary disease. Electronically Signed   By:  Sherron Ales M.D.   On: 03/12/2021 10:20   DG Abdomen 1 View  Result Date: 03/12/2021 CLINICAL DATA:  Fall, shunt series EXAM: ABDOMEN - 1 VIEW COMPARISON:  05/07/2011 FINDINGS: Shunt catheter is seen in the right abdomen terminating in the midline of the pelvis. This appears intact. Nonobstructive bowel gas pattern. Moderate stool throughout the colon. No suspicious calcification. IMPRESSION: Shunt catheter in the right abdomen and pelvis appears intact. Moderate stool burden. Electronically Signed   By: Charlett Nose M.D.   On: 03/12/2021 18:56   CT HEAD WO CONTRAST ( )  Result Date: 03/12/2021 CLINICAL DATA:  Witnessed backward fall at home today, syncope, remote traumatic brain injury in 2011 with seizures and fall since, VP shunt EXAM: CT HEAD WITHOUT CONTRAST TECHNIQUE: Contiguous axial images were obtained from the base of the skull through the vertex without intravenous contrast. Sagittal and coronal MPR images reconstructed from axial data set. COMPARISON:  03/19/2015 FINDINGS: Brain: VP shunt identified via RIGHT frontal approach with tip at anterior third ventricle. Postsurgical changes of LEFT temporoparietal craniotomy. Large area of encephalomalacia involving the LEFT frontal and temporal lobes, less at inferior RIGHT frontal lobe. Ex vacuo dilatation of the LEFT lateral ventricle. Appearance similar to that on previous exam. No midline shift. No acute intracranial hemorrhage, mass lesion, or evidence of acute infarction. Scattered dural calcifications and dural thickening overlying LEFT hemisphere likely related to prior surgery, unchanged. Vascular: No hyperdense vessels Skull: Postoperative changes of LEFT frontotemporoparietal craniotomy. No acute osseous findings. Sinuses/Orbits: Clear Other: N/A IMPRESSION: Postoperative changes of LEFT frontotemporoparietal craniotomy with large area of encephalomalacia involving the LEFT frontal and temporal lobes, less at inferior RIGHT frontal lobe,  with chronic associated ex vacuo dilatation of the LEFT lateral ventricle. VP shunt via RIGHT frontal approach with tip at anterior third ventricle. No acute/new intracranial abnormalities. Electronically Signed   By: Ulyses Southward M.D.   On: 03/12/2021 11:45    Procedures Procedures   Medications Ordered in ED Medications  vancomycin (VANCOREADY) IVPB 1500 mg/300 mL (has no administration in time range)  bisacodyl (DULCOLAX) suppository 10 mg (has no administration in time range)  lactated ringers infusion (has no administration in time range)  sodium chloride 0.9 % bolus 1,000 mL (0 mLs  Intravenous Stopped 03/12/21 1934)  acetaminophen (TYLENOL) 160 MG/5ML solution 500 mg (500 mg Oral Given 03/12/21 1743)  sodium chloride 0.9 % bolus 1,000 mL (1,000 mLs Intravenous New Bag/Given 03/12/21 1946)  ceFEPIme (MAXIPIME) 2 g in sodium chloride 0.9 % 100 mL IVPB (2 g Intravenous New Bag/Given 03/12/21 1947)    ED Course  I have reviewed the triage vital signs and the nursing notes.  Pertinent labs & imaging results that were available during my care of the patient were reviewed by me and considered in my medical decision making (see chart for details).    MDM Rules/Calculators/A&P                          35 year old male that presents emergency department today for syncopal episode with dad, patient is primarily nonverbal after TBI accident that occurred multiple years ago.  Dad brought him in today due to syncopal episode, differential to include seizures versus vasovagal syncope versus orthostatic syncope versus versus CVA versus infection.  VP shunt series normal.  Patient with mentation at baseline.    While in the emergency department patient was found to be with blood pressure 89 systolic, with tachypnea and tachycardia and fever.  Unfortunately patient's been in the waiting room for a while prior to me seeing him, after ordering a lactic acid which was elevated to 2.2, will initiate sepsis  work-up.  No clear source of infection.  No clear source, therefore broad-spectrum antibiotics initiated. Chest x-ray interpreted me with no sign of infection, urinalysis clean, no elevated white count.  Less likely to fever CNS infection due to baseline mentation, however could consider neurosurgery consult during admission.  Patient is high risk due to medical comorbidities with SIRS criteria and fever, I think admission would be appropriate at this time.  Patient admitted to Dr. Leafy HalfShalhoub.   The patient appears reasonably stabilized for admission considering the current resources, flow, and capabilities available in the ED at this time, and I doubt any other [facility] further screening and/or treatment in the ED prior to admission.  I discussed this case with my attending physician who cosigned this note including patient's presenting symptoms, physical exam, and planned diagnostics and interventions. Attending physician stated agreement with plan or made changes to plan which were implemented.   Attending physician assessed patient at bedside.  Final Clinical Impression(s) / ED Diagnoses Final diagnoses:  Syncope and collapse  Fever of unknown origin    Rx / DC Orders ED Discharge Orders     None           Farrel Gordonatel, Maliah Pyles, PA-C 03/12/21 2136    Wynetta FinesMessick, Peter C, MD 03/15/21 0003

## 2021-03-12 NOTE — ED Notes (Signed)
Pt returned from X-ray.  

## 2021-03-13 ENCOUNTER — Other Ambulatory Visit: Payer: Self-pay

## 2021-03-13 ENCOUNTER — Ambulatory Visit (HOSPITAL_BASED_OUTPATIENT_CLINIC_OR_DEPARTMENT_OTHER): Payer: BC Managed Care – PPO

## 2021-03-13 DIAGNOSIS — R6510 Systemic inflammatory response syndrome (SIRS) of non-infectious origin without acute organ dysfunction: Secondary | ICD-10-CM | POA: Diagnosis not present

## 2021-03-13 DIAGNOSIS — R55 Syncope and collapse: Secondary | ICD-10-CM | POA: Diagnosis not present

## 2021-03-13 LAB — CBC WITH DIFFERENTIAL/PLATELET
Abs Immature Granulocytes: 0.01 10*3/uL (ref 0.00–0.07)
Basophils Absolute: 0 10*3/uL (ref 0.0–0.1)
Basophils Relative: 0 %
Eosinophils Absolute: 0.1 10*3/uL (ref 0.0–0.5)
Eosinophils Relative: 1 %
HCT: 38.4 % — ABNORMAL LOW (ref 39.0–52.0)
Hemoglobin: 12.8 g/dL — ABNORMAL LOW (ref 13.0–17.0)
Immature Granulocytes: 0 %
Lymphocytes Relative: 9 %
Lymphs Abs: 0.6 10*3/uL — ABNORMAL LOW (ref 0.7–4.0)
MCH: 30.3 pg (ref 26.0–34.0)
MCHC: 33.3 g/dL (ref 30.0–36.0)
MCV: 90.8 fL (ref 80.0–100.0)
Monocytes Absolute: 0.6 10*3/uL (ref 0.1–1.0)
Monocytes Relative: 9 %
Neutro Abs: 5.3 10*3/uL (ref 1.7–7.7)
Neutrophils Relative %: 81 %
Platelets: 85 10*3/uL — ABNORMAL LOW (ref 150–400)
RBC: 4.23 MIL/uL (ref 4.22–5.81)
RDW: 12.7 % (ref 11.5–15.5)
WBC: 6.7 10*3/uL (ref 4.0–10.5)
nRBC: 0 % (ref 0.0–0.2)

## 2021-03-13 LAB — COMPREHENSIVE METABOLIC PANEL
ALT: 41 U/L (ref 0–44)
AST: 45 U/L — ABNORMAL HIGH (ref 15–41)
Albumin: 3 g/dL — ABNORMAL LOW (ref 3.5–5.0)
Alkaline Phosphatase: 33 U/L — ABNORMAL LOW (ref 38–126)
Anion gap: 7 (ref 5–15)
BUN: 7 mg/dL (ref 6–20)
CO2: 23 mmol/L (ref 22–32)
Calcium: 8.3 mg/dL — ABNORMAL LOW (ref 8.9–10.3)
Chloride: 107 mmol/L (ref 98–111)
Creatinine, Ser: 0.72 mg/dL (ref 0.61–1.24)
GFR, Estimated: >60 mL/min (ref >60)
Glucose, Bld: 142 mg/dL — ABNORMAL HIGH (ref 70–99)
Potassium: 4 mmol/L (ref 3.5–5.1)
Sodium: 137 mmol/L (ref 135–145)
Total Bilirubin: 0.9 mg/dL (ref 0.3–1.2)
Total Protein: 6.4 g/dL — ABNORMAL LOW (ref 6.5–8.1)

## 2021-03-13 LAB — ECHOCARDIOGRAM COMPLETE
AR max vel: 2.59 cm2
AV Area VTI: 2.64 cm2
AV Area mean vel: 2.53 cm2
AV Mean grad: 3 mmHg
AV Peak grad: 6.4 mmHg
Ao pk vel: 1.26 m/s
Area-P 1/2: 4.21 cm2
Height: 66 in
S' Lateral: 2.8 cm
Weight: 2617.3 oz

## 2021-03-13 LAB — C-REACTIVE PROTEIN
CRP: 17.6 mg/dL — ABNORMAL HIGH (ref <1.0)
CRP: 18.9 mg/dL — ABNORMAL HIGH (ref <1.0)

## 2021-03-13 LAB — HIV ANTIBODY (ROUTINE TESTING W REFLEX): HIV Screen 4th Generation wRfx: NONREACTIVE

## 2021-03-13 LAB — PROCALCITONIN
Procalcitonin: 0.21 ng/mL
Procalcitonin: 0.24 ng/mL

## 2021-03-13 LAB — PROTIME-INR
INR: 1.2 (ref 0.8–1.2)
Prothrombin Time: 14.8 seconds (ref 11.4–15.2)

## 2021-03-13 LAB — MAGNESIUM: Magnesium: 2.1 mg/dL (ref 1.7–2.4)

## 2021-03-13 LAB — GROUP A STREP BY PCR: Group A Strep by PCR: NOT DETECTED

## 2021-03-13 LAB — CORTISOL-AM, BLOOD: Cortisol - AM: 21.7 ug/dL (ref 6.7–22.6)

## 2021-03-13 MED ORDER — LACOSAMIDE 10 MG/ML PO SOLN
200.0000 mg | Freq: Two times a day (BID) | ORAL | Status: DC
  Administered 2021-03-13 – 2021-03-15 (×5): 200 mg via ORAL
  Filled 2021-03-13 (×5): qty 21

## 2021-03-13 NOTE — Progress Notes (Signed)
HOSPITAL MEDICINE OVERNIGHT EVENT NOTE    Patient spiked another fever of 103.   Followed up on abdominal Ct did not reveal a source of infection. Procalcitonin and CRP still pending.  Patient still mentating at baseline.    I Discussed case with NP Leo Grosser of Neurosurgery, reviewed the patient's entire work-up to this point.  She still feels that this does not sound like a VP shunt infection at this point.   She agrees that with the patients several days of runny nose and sore throat that this may very well still be viral and agrees with current management.    She recommends that if patient neurologically deteriorates or clinically changes that they would be happy to formally consult at that point.   Continue to monitor.  Awaiting CRP/procalcitonin and if procalcitonin elevated will reinitiate empiric antibiotic therapy.  Paul Elk  MD [facility]

## 2021-03-13 NOTE — Plan of Care (Signed)

## 2021-03-13 NOTE — Progress Notes (Signed)
  Echocardiogram 2D Echocardiogram has been performed.  Roosvelt Maser F 03/13/2021, 9:46 AM

## 2021-03-13 NOTE — Plan of Care (Signed)

## 2021-03-13 NOTE — Progress Notes (Signed)
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  Noted last many days to be shaky and tired. In the emergency room low-grade temperature, later in the hospital temperature was 103. 3.  Tachypnea.  Blood pressures low as 80s.  Responded to IV fluids.  COVID-19 and influenza swab negative.  Blood cultures were drawn and is started on broad-spectrum antibiotics. Mother reports that patient had runny nose and sore throat last few days.  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  Cultures drawn. Urinalysis normal.  Chest x-ray unremarkable.  COVID testing negative. CT scan abdomen pelvis with no acute findings. Patient does have a VP shunt in place. Started on broad-spectrum antibiotics with vancomycin and cefepime, will continue empiric antibiotics.  If other source of infection are negative, will discuss with neurosurgery.  Syncope: Likely due to hypotension.  Volume resuscitated.  Improved.  Dysphagia: On thickened liquids with pure consistency diet.  Seizure disorder: No evidence of new seizure.  Therapeutic on Keppra and Vimpat.  Hypokalemia: Replaced and improved.  DVT prophylaxis: enoxaparin (LOVENOX) injection 40 mg  Start: 03/12/21 2200   Code Status: Full code Family Communication: Mother at the bedside Disposition Plan: Status is: Observation  The patient will require care spanning > 2 midnights and should be moved to inpatient because: Inpatient level of care appropriate due to severity of illness  Dispo: The patient is from: Home              Anticipated d/c is to: Home              Patient currently is not medically stable to d/c.   Difficult to place patient No         Consultants:  None  Procedures:  None  Antimicrobials:  Vancomycin and cefepime 8/24---   Subjective: Patient seen and examined.  Overnight he was febrile with temperature of 103.  Mother was at the bedside. Patient has difficulty expressing, however he is able to tell me that he has to pee.  He demonstrates how much he can use the left side and how much he can use the right side.  He tells me he feels fine.  Denies any chest pain cough or sore throat.  Objective: Vitals:   03/13/21 0102 03/13/21 0353 03/13/21 0356 03/13/21 0812  BP: 97/61  100/61   Pulse: 96  100   Resp: 16  16   Temp: 99.9 F (37.7 C)  99 F (37.2 C) 100 F (37.8 C)  TempSrc: Tympanic  Axillary Oral  SpO2: 95%  95%   Weight: 74.2 kg     Height:  5\' 6"  (1.676 m)      Intake/Output Summary (Last 24 hours) at 03/13/2021 1113 Last data filed at 03/13/2021 0900 Gross per 24  hour  Intake 4483.61 ml  Output --  Net 4483.61 ml   Filed Weights   03/12/21 2141 03/13/21 0102  Weight: 75.3 kg 74.2 kg    Examination:  General exam: Debilitated gentleman with chronic illnesses.  Not in any distress.  On room air. Patient has difficulty speaking, however he is able to talk in simple sentences.  He is alert and oriented. Respiratory system: Clear to auscultation. Respiratory effort normal.  No added sounds. Cardiovascular system: S1 & S2 heard, no additional sounds.   Gastrointestinal system: Soft and nontender.  He expressed some  tenderness on the suprapubic area. Central nervous system: Alert and oriented.  Dysarthria due to TBI. Spastic hemiparesis right upper and lower extremity, he can move it with power 3/5. Left upper extremity is normal. Left lower extremities normal power.    Data Reviewed: I have personally reviewed following labs and imaging studies  CBC: Recent Labs  Lab 03/12/21 0932 03/13/21 0311  WBC 8.0 6.7  NEUTROABS 6.7 5.3  HGB 14.1 12.8*  HCT 42.0 38.4*  MCV 90.1 90.8  PLT 108* 85*   Basic Metabolic Panel: Recent Labs  Lab 03/12/21 0932 03/13/21 0311  NA 132* 137  K 3.4* 4.0  CL 99 107  CO2 25 23  GLUCOSE 126* 142*  BUN 9 7  CREATININE 0.98 0.72  CALCIUM 8.4* 8.3*  MG  --  2.1   GFR: Estimated Creatinine Clearance: 117.4 mL/min (by C-G formula based on SCr of 0.72 mg/dL). Liver Function Tests: Recent Labs  Lab 03/12/21 0932 03/13/21 0311  AST 34 45*  ALT 37 41  ALKPHOS 39 33*  BILITOT 0.9 0.9  PROT 7.1 6.4*  ALBUMIN 3.6 3.0*   No results for input(s): LIPASE, AMYLASE in the last 168 hours. No results for input(s): AMMONIA in the last 168 hours. Coagulation Profile: Recent Labs  Lab 03/13/21 0311  INR 1.2   Cardiac Enzymes: No results for input(s): CKTOTAL, CKMB, CKMBINDEX, TROPONINI in the last 168 hours. BNP (last 3 results) No results for input(s): PROBNP in the last 8760 hours. HbA1C: No results for input(s): HGBA1C in the last 72 hours. CBG: No results for input(s): GLUCAP in the last 168 hours. Lipid Profile: No results for input(s): CHOL, HDL, LDLCALC, TRIG, CHOLHDL, LDLDIRECT in the last 72 hours. Thyroid Function Tests: No results for input(s): TSH, T4TOTAL, FREET4, T3FREE, THYROIDAB in the last 72 hours. Anemia Panel: No results for input(s): VITAMINB12, FOLATE, FERRITIN, TIBC, IRON, RETICCTPCT in the last 72 hours. Sepsis Labs: Recent Labs  Lab 03/12/21 1751 03/12/21 1929 03/12/21 2349 03/13/21 0311  PROCALCITON  --   --  0.24 0.21   LATICACIDVEN 2.2* 1.7  --   --     Recent Results (from the past 240 hour(s))  Blood culture (routine x 2)     Status: None (Preliminary result)   Collection Time: 03/12/21  5:51 PM   Specimen: BLOOD  Result Value Ref Range Status   Specimen Description BLOOD RIGHT ANTECUBITAL  Final   Special Requests   Final    BOTTLES DRAWN AEROBIC AND ANAEROBIC Blood Culture results may not be optimal due to an inadequate volume of blood received in culture bottles   Culture   Final    NO GROWTH < 12 HOURS Performed at [facility], [address] [place], [place] [address]    Report Status PENDING  Incomplete  Blood culture (routine x 2)     Status: None (Preliminary result)  Collection Time: 03/12/21  5:51 PM   Specimen: BLOOD  Result Value Ref Range Status   Specimen Description BLOOD LEFT ANTECUBITAL  Final   Special Requests   Final    BOTTLES DRAWN AEROBIC AND ANAEROBIC Blood Culture results may not be optimal due to an excessive volume of blood received in culture bottles   Culture   Final    NO GROWTH < 12 HOURS Performed at [facility], [address], [place], [place] [address]    Report Status PENDING  Incomplete  Resp Panel by RT-PCR (Flu A&B, Covid) Nasopharyngeal Swab     Status: None   Collection Time: 03/12/21  5:56 PM   Specimen: Nasopharyngeal Swab; Nasopharyngeal(NP) swabs in vial transport medium  Result Value Ref Range Status   SARS Coronavirus 2 by RT PCR NEGATIVE NEGATIVE Final    Comment: (NOTE) SARS-CoV-2 target nucleic acids are NOT DETECTED.  The SARS-CoV-2 RNA is generally detectable in upper respiratory specimens during the acute phase of infection. The lowest concentration of SARS-CoV-2 viral copies this assay can detect is 138 copies/mL. A negative result does not preclude SARS-Cov-2 infection and should not be used as the sole basis for treatment or other patient management decisions. A negative result may occur with  improper specimen  collection/handling, submission of specimen other than nasopharyngeal swab, presence of viral mutation(s) within the areas targeted by this assay, and inadequate number of viral copies(<138 copies/mL). A negative result must be combined with clinical observations, patient history, and epidemiological information. The expected result is Negative.  Fact Sheet for Patients:  BloggerCourse.com  Fact Sheet for Healthcare Providers:  SeriousBroker.it  This test is no t yet approved or cleared by the [place] FDA and  has been authorized for detection and/or diagnosis of SARS-CoV-2 by FDA under an Emergency Use Authorization (EUA). This EUA will remain  in effect (meaning this test can be used) for the duration of the COVID-19 declaration under Section 564(b)(1) of the Act, 21 U.S.C.section 360bbb-3(b)(1), unless the authorization is terminated  or revoked sooner.       Influenza A by PCR NEGATIVE NEGATIVE Final   Influenza B by PCR NEGATIVE NEGATIVE Final    Comment: (NOTE) The Xpert Xpress SARS-CoV-2/FLU/RSV plus assay is intended as an aid in the diagnosis of influenza from Nasopharyngeal swab specimens and should not be used as a sole basis for treatment. Nasal washings and aspirates are unacceptable for Xpert Xpress SARS-CoV-2/FLU/RSV testing.  Fact Sheet for Patients: BloggerCourse.com  Fact Sheet for Healthcare Providers: SeriousBroker.it  This test is not yet approved or cleared by the [place] FDA and has been authorized for detection and/or diagnosis of SARS-CoV-2 by FDA under an Emergency Use Authorization (EUA). This EUA will remain in effect (meaning this test can be used) for the duration of the COVID-19 declaration under Section 564(b)(1) of the Act, 21 U.S.C. section 360bbb-3(b)(1), unless the authorization is terminated or revoked.  Performed at [facility], [address], [place], [place] [address]   Group A Strep by PCR     Status: None   Collection Time: 03/12/21 11:49 PM   Specimen: Throat; Sterile Swab  Result Value Ref Range Status   Group A Strep by PCR NOT DETECTED NOT DETECTED Final    Comment: Performed at [facility], [address], [place], [place] [address]         Radiology Studies: DG Chest 2 View  Result Date: 03/12/2021 CLINICAL DATA:  Syncope. EXAM: CHEST - 2 VIEW COMPARISON:  Chest radiograph 11/28/2016 FINDINGS: The heart size and mediastinal contours are within normal limits. Both lungs are clear. The visualized skeletal structures are unremarkable. Stable elevation of the right hemidiaphragm. Partially visualized shunt catheter tubing overlies the anterior right chest wall and right upper abdomen. IMPRESSION: No active cardiopulmonary disease. Electronically Signed   By: Sherron Ales M.D.   On: 03/12/2021 10:20   DG Abdomen 1 View  Result Date: 03/12/2021 CLINICAL DATA:  Fall, shunt series EXAM: ABDOMEN - 1 VIEW COMPARISON:  05/07/2011 FINDINGS: Shunt catheter is seen in the right abdomen terminating in the midline of the pelvis. This appears intact. Nonobstructive bowel gas pattern. Moderate stool throughout the colon. No suspicious calcification. IMPRESSION: Shunt catheter in the right abdomen and pelvis appears intact. Moderate stool burden. Electronically Signed   By: Charlett Nose M.D.   On: 03/12/2021 18:56   CT HEAD WO CONTRAST ( )  Result Date: 03/12/2021 CLINICAL DATA:  Witnessed backward fall at home today, syncope, remote traumatic brain injury in 2011 with seizures and fall since, VP shunt EXAM: CT HEAD WITHOUT CONTRAST TECHNIQUE: Contiguous axial images were obtained from the base of the skull through the vertex without intravenous contrast. Sagittal and coronal MPR images reconstructed from axial data set. COMPARISON:  03/19/2015 FINDINGS: Brain: VP shunt identified via RIGHT frontal  approach with tip at anterior third ventricle. Postsurgical changes of LEFT temporoparietal craniotomy. Large area of encephalomalacia involving the LEFT frontal and temporal lobes, less at inferior RIGHT frontal lobe. Ex vacuo dilatation of the LEFT lateral ventricle. Appearance similar to that on previous exam. No midline shift. No acute intracranial hemorrhage, mass lesion, or evidence of acute infarction. Scattered dural calcifications and dural thickening overlying LEFT hemisphere likely related to prior surgery, unchanged. Vascular: No hyperdense vessels Skull: Postoperative changes of LEFT frontotemporoparietal craniotomy. No acute osseous findings. Sinuses/Orbits: Clear Other: N/A IMPRESSION: Postoperative changes of LEFT frontotemporoparietal craniotomy with large area of encephalomalacia involving the LEFT frontal and temporal lobes, less at inferior RIGHT frontal lobe, with chronic associated ex vacuo dilatation of the LEFT lateral ventricle. VP shunt via RIGHT frontal approach with tip at anterior third ventricle. No acute/new intracranial abnormalities. Electronically Signed   By: Ulyses Southward M.D.   On: 03/12/2021 11:45   CT ABDOMEN PELVIS W CONTRAST  Result Date: 03/12/2021 CLINICAL DATA:  Sepsis, acute abdominal pain, nonlocalized. Diffuse abdominal tenderness. Fever of unknown source. EXAM: CT ABDOMEN AND PELVIS WITH CONTRAST TECHNIQUE: Multidetector CT imaging of the abdomen and pelvis was performed using the standard protocol following bolus administration of intravenous contrast. CONTRAST:  41mL OMNIPAQUE IOHEXOL 350 MG/ML SOLN COMPARISON:  11/23/2009 FINDINGS: Lower chest: Atelectasis in the lung bases. Hepatobiliary: No focal liver abnormality is seen. No gallstones, gallbladder wall thickening, or biliary dilatation. Pancreas: Unremarkable. No pancreatic ductal dilatation or surrounding inflammatory changes. Spleen: Normal in size without focal abnormality. Adrenals/Urinary Tract: Adrenal  glands are unremarkable. Kidneys are normal, without renal calculi, focal lesion, or hydronephrosis. Bladder is unremarkable. Stomach/Bowel: Stomach, small bowel, and colon are not abnormally distended. No wall thickening or inflammatory changes are appreciated. Appendix is normal. Vascular/Lymphatic: No significant vascular findings are present. No enlarged abdominal or pelvic lymph nodes. Reproductive: Prostate is unremarkable. Other: No free air or free fluid in the abdomen. Ventricular peritoneal shunt tubing terminating in the pelvis. Abdominal wall musculature appears intact. Musculoskeletal: No acute or significant osseous findings. IMPRESSION: No acute abnormalities demonstrated in the abdomen or pelvis. Nothing to explain  abdominal pain. Ventricular peritoneal shunt tubing appears in appropriate position. Atelectasis in the lung bases. Electronically Signed   By: Burman NievesWilliam  Stevens M.D.   On: 03/12/2021 23:35        Scheduled Meds:  enoxaparin (LOVENOX) injection  40 mg Subcutaneous Q24H   lacosamide  200 mg Oral BID   levETIRAcetam  250 mg Oral BID   Continuous Infusions:  ceFEPime (MAXIPIME) IV 2 g (03/13/21 [id number])   lactated ringers 125 mL/hr at 03/13/21 0146   vancomycin 1,000 mg (03/13/21 1040)     LOS: 0 days    Time spent: 35 minutes    Dorcas CarrowKuber Jesiah Yerby, MD [facility] Pager [phone]

## 2021-03-14 DIAGNOSIS — R6510 Systemic inflammatory response syndrome (SIRS) of non-infectious origin without acute organ dysfunction: Secondary | ICD-10-CM | POA: Diagnosis not present

## 2021-03-14 DIAGNOSIS — R55 Syncope and collapse: Secondary | ICD-10-CM | POA: Diagnosis not present

## 2021-03-14 DIAGNOSIS — R6520 Severe sepsis without septic shock: Secondary | ICD-10-CM | POA: Diagnosis present

## 2021-03-14 LAB — LEVETIRACETAM LEVEL: Levetiracetam Lvl: 3.4 ug/mL — ABNORMAL LOW (ref 10.0–40.0)

## 2021-03-14 NOTE — Evaluation (Signed)
Physical Therapy Evaluation Patient Details Name: Paul Kane MRN: [id number] DOB: 24-Jul-1985 Today's Date: 03/14/2021   History of Present Illness  35 y.o. male presenting to ED after sudden LOC witnessed by father. Patient admitted with SIRS. CT head (-) for acute findings. Urinalysis (-). PMHx significant for TBI 11 ago with residual neurological deficits including dysphagia, dysarthria, R sided spastic paralysis and foot drop; s/p VP shunt placement; seizure disorder and chronic hep B.  Clinical Impression  Pt admitted with above diagnosis and presents to PT with functional limitations due to deficits listed below (See PT problem list). Pt needs skilled PT to maximize independence and safety to allow discharge to home with supportive family. Pt's father reports a general decline in mobility since the start of covid and his decline in going to the gym. Pt's father is interested in pt getting aquatic therapy. I gave pt's father information about [facility] where there is aquatic therapy. Pt's father wishes to set this up through pt's physiatrist after pt returns home.      Follow Up Recommendations Other (comment) (OPPT aquatic therapy - father will arrange through referral from pt's physiatrist)    Equipment Recommendations  None recommended by PT    Recommendations for Other Services       Precautions / Restrictions Precautions Precautions: Fall      Mobility  Bed Mobility Overal bed mobility: Needs Assistance Bed Mobility: Supine to Sit     Supine to sit: Mod assist     General bed mobility comments: Assist to elevate trunk provided by pt's father    Transfers Overall transfer level: Needs assistance Equipment used: 1 person hand held assist Transfers: Sit to/from Stand Sit to Stand: Min assist         General transfer comment: Assist for balance with support at rt elbow  Ambulation/Gait Ambulation/Gait assistance: Min assist;+2 safety/equipment Gait  Distance (Feet): 70 Feet Assistive device: 1 person hand held assist Gait Pattern/deviations: Step-through pattern;Decreased step length - right;Decreased dorsiflexion - right;Ataxic Gait velocity: decr Gait velocity interpretation: <1.31 ft/sec, indicative of household ambulator General Gait Details: Pt using countertop or hand held on left. Assist for balance. Pt's father providing hand held on left.  Stairs            Wheelchair Mobility    Modified Rankin (Stroke Patients Only)       Balance Overall balance assessment: Needs assistance Sitting-balance support: No upper extremity supported;Feet supported Sitting balance-Leahy Scale: Fair     Standing balance support: No upper extremity supported Standing balance-Leahy Scale: Fair                               Pertinent Vitals/Pain Pain Assessment: Faces Faces Pain Scale: Hurts little more Pain Location: RLE Pain Descriptors / Indicators: Grimacing Pain Intervention(s): Limited activity within patient's tolerance;Repositioned    Home Living Family/patient expects to be discharged to:: Private residence Living Arrangements: Parent Available Help at Discharge: Family;Available 24 hours/day Type of Home: House                Prior Function Level of Independence: Needs assistance   Gait / Transfers Assistance Needed: Amb modified independent but with frequent falls. Pt with general decline in mobility since covid and pt with decr activity. [place] [facility] [place].           Hand Dominance  Extremity/Trunk Assessment   Upper Extremity Assessment Upper Extremity Assessment: Defer to OT evaluation    Lower Extremity Assessment Lower Extremity Assessment: Generalized weakness;RLE deficits/detail RLE Deficits / Details: Pt with decr with decr dorsiflexion, poor need control.       Communication   Communication: Expressive difficulties  Cognition  Arousal/Alertness: Awake/alert Behavior During Therapy: WFL for tasks assessed/performed Overall Cognitive Status: History of cognitive impairments - at baseline                                        General Comments      Exercises     Assessment/Plan    PT Assessment Patient needs continued PT services  PT Problem List Decreased strength;Decreased balance;Impaired tone;Decreased mobility       PT Treatment Interventions Functional mobility training;Balance training;Patient/family education;DME instruction;Gait training;Therapeutic activities;Therapeutic exercise    PT Goals (Current goals can be found in the Care Plan section)  Acute Rehab PT Goals Patient Stated Goal: go home PT Goal Formulation: With patient/family Time For Goal Achievement: 03/21/21 Potential to Achieve Goals: Good    Frequency Min 3X/week   Barriers to discharge        Co-evaluation               AM-PAC PT "6 Clicks" Mobility  Outcome Measure Help needed turning from your back to your side while in a flat bed without using bedrails?: A Little Help needed moving from lying on your back to sitting on the side of a flat bed without using bedrails?: A Lot Help needed moving to and from a bed to a chair (including a wheelchair)?: A Little Help needed standing up from a chair using your arms (e.g., wheelchair or bedside chair)?: A Little Help needed to walk in hospital room?: A Little Help needed climbing 3-5 steps with a railing? : A Little 6 Click Score: 17    End of Session Equipment Utilized During Treatment: Gait belt Activity Tolerance: Patient tolerated treatment well Patient left: in bed;with bed alarm set;with family/visitor present (sitting EOB) Nurse Communication: Mobility status PT Visit Diagnosis: Unsteadiness on feet (R26.81);Repeated falls (R29.6);Other abnormalities of gait and mobility (R26.89)    Time: 1438-1500 PT Time Calculation (min) (ACUTE ONLY): 22  min   Charges:   PT Evaluation $PT Eval Moderate Complexity: 1 Mod          [facility] PT Acute Rehabilitation Services Pager [phone] Office [phone]   Angelina Ok [facility] 03/14/2021, 4:35 PM

## 2021-03-14 NOTE — Progress Notes (Signed)
PT Cancellation Note  Patient Details Name: JALEN DALUZ MRN: [id number] DOB: 08-04-1985   Cancelled Treatment:    Reason Eval/Treat Not Completed: Other (comment). Pt eating lunch. Will return later this PM.   Angelina Ok [facility] 03/14/2021, 1:36 PM Skip Mayer PT Acute Rehabilitation Services Pager [phone] Office [phone]

## 2021-03-14 NOTE — Plan of Care (Signed)

## 2021-03-14 NOTE — Progress Notes (Signed)
Pharmacy Antibiotic Note  Paul Kane is a 35 y.o. male admitted on 03/12/2021 with sepsis.  Continues on Vancomycin and Cefepime - Day # 3  Patient with a history of intracranial shunt status post TBI, hypertension, cerebral thrombosis with cerebral infarction, chronic hepatitis B, paralysis, spastic hemiplegia affecting dominant side. Patient presented following syncopal episode in the home.  Scr stable, Blood cultures negative to date, Tmax 100.1  Plan: Cefepime 2g q8h Vancomycin 1500mg  once then 1000mg  q12h (eAUC 511.5) AUC dosing utilized for vancomycin, may require adjustment if shunt/CNS felt to be source, though this is not primary concern at this time  De-escalate antibiotics when appropriate  Height: 5\' 6"  (167.6 cm) Weight: 74.2 kg (163 lb 9.3 oz) IBW/kg (Calculated) : 63.8  Temp (24hrs), Avg:99.6 F (37.6 C), Min:98.7 F (37.1 C), Max:100.1 F (37.8 C)  Recent Labs  Lab 03/12/21 0932 03/12/21 1751 03/12/21 1929 03/13/21 0311  WBC 8.0  --   --  6.7  CREATININE 0.98  --   --  0.72  LATICACIDVEN  --  2.2* 1.7  --      Estimated Creatinine Clearance: 117.4 mL/min (by C-G formula based on SCr of 0.72 mg/dL).    No Known Allergies  Antimicrobials this admission: cefepime 8/24 >>  vancomycin 8/24 >>   Microbiology results: pending  Thank you 03/15/21, PharmD  03/14/2021 10:09 AM
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
## 2021-03-15 DIAGNOSIS — R55 Syncope and collapse: Secondary | ICD-10-CM | POA: Diagnosis not present

## 2021-03-15 DIAGNOSIS — R6510 Systemic inflammatory response syndrome (SIRS) of non-infectious origin without acute organ dysfunction: Secondary | ICD-10-CM | POA: Diagnosis not present

## 2021-03-15 MED ORDER — BISACODYL 10 MG RE SUPP
10.0000 mg | Freq: Every day | RECTAL | Status: AC | PRN
Start: 2021-03-15 — End: 2021-03-15
  Administered 2021-03-15: 10 mg via RECTAL
  Filled 2021-03-15: qty 1

## 2021-03-15 NOTE — TOC Transition Note (Signed)
Transition of Care [facility]) - CM/SW Discharge Note   Patient Details  Name: Paul Kane MRN: [id number] Date of Birth: 09-10-1985  [facility] CM/SW Contact:  Lawerance Sabal, RN Phone Number: 03/15/2021, 9:39 AM   Clinical Narrative:    Patient w Hx TBI. Spoke to father to set up HH PTOT.  Referral made to [facility], no DME needs. No other CM needs identified.     Final next level of care: Home w Home Health Services Barriers to Discharge: No Barriers Identified   Patient Goals and CMS Choice Patient states their goals for this hospitalization and ongoing recovery are:: to return home, per father CMS Medicare.gov Compare Post Acute Care list provided to:: Other (Comment Required) Choice offered to / list presented to : Parent  Discharge Placement                       Discharge Plan and Services                          HH Arranged: PT, OT [facility] Agency: [facility] Date [facility] Agency Contacted: 03/15/21 Time HH Agency Contacted: [phone] Representative spoke with at [facility] Agency: Kandee Keen  Social Determinants of Health (SDOH) Interventions     Readmission Risk Interventions No flowsheet data found.

## 2021-03-15 NOTE — Discharge Summary (Signed)
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 360bbb-3(b)(1), unless the authorization is terminated or revoked.  Performed at [facility], [address], [place], [place] [address]   Group A Strep by PCR     Status: None   Collection Time: 03/12/21 11:49 PM   Specimen: Throat; Sterile Swab  Result Value Ref Range Status   Group A Strep by PCR NOT DETECTED NOT DETECTED Final    Comment: Performed at [facility], [address] [place], [place] [address]     Time coordinating discharge:  35 minutes  SIGNED:   Dorcas Carrow, MD  [facility] 03/15/2021,  8:38 AM

## 2021-03-17 LAB — CULTURE, BLOOD (ROUTINE X 2)
Culture: NO GROWTH
Culture: NO GROWTH

## 2021-03-19 ENCOUNTER — Encounter: Payer: Self-pay | Admitting: Physical Medicine & Rehabilitation

## 2021-03-19 ENCOUNTER — Other Ambulatory Visit: Payer: Self-pay

## 2021-03-19 ENCOUNTER — Encounter
Payer: BC Managed Care – PPO | Attending: Physical Medicine & Rehabilitation | Admitting: Physical Medicine & Rehabilitation

## 2021-03-19 VITALS — BP 92/61 | HR 62 | Temp 97.8°F | Ht 66.0 in | Wt 162.0 lb

## 2021-03-19 DIAGNOSIS — S062X5S 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 pre-existing conscious levels, sequela: Secondary | ICD-10-CM | POA: Diagnosis present

## 2021-03-19 DIAGNOSIS — G8111 Spastic hemiplegia affecting right dominant side: Secondary | ICD-10-CM | POA: Insufficient documentation

## 2021-03-19 NOTE — Patient Instructions (Signed)
PLEASE FEEL FREE TO CALL OUR OFFICE WITH ANY PROBLEMS OR QUESTIONS ([phone])      

## 2021-03-19 NOTE — Progress Notes (Signed)
Subjective:    Patient ID: Paul Kane, male    DOB: July 09, 1986, 35 y.o.   MRN: [id number]  HPI  Paul Kane, is here in follow up of his spastic hemiplegia. He was in the hospital with SIRS last week. He was admitted with a temp of 103. No bacterial source was found. He was discharged 8/27  They are struggling with the brace fit. The new one has been too restrictive per dad. Adjustments are being made but dad says they are slow in doing things. Dad found a knee brace to help control his right knee. He's been falling more as well over the last year or so.  He was in a brace initially but then came out of it because he felt it was too restrictive and hindered his ambulation.   Pain Inventory Average Pain 7 Pain Right Now 7 My pain is intermittent and pain when walking  LOCATION OF PAIN  Right knee & Right leg  BOWEL Number of stools per week: 3 Oral laxative use No  Type of laxative none, lots of veggies Enema or suppository use Yes  History of colostomy No  Incontinent No   BLADDER Normal   Mobility walk without assistance how many minutes can you walk? 15 minutes ability to climb steps?  yes do you drive?  no Do you have any goals in this area?  yes  Function  disabled I need assistance with the following:  feeding, bathing, toileting, meal prep, household duties, and shopping Do you have any goals in this area?  yes  Neuro/Psych weakness trouble walking  Prior Studies Any changes since last visit?  yes, Seen at [facility] last week  Physicians involved in your care Any changes since last visit?  no    Family History  Problem Relation Age of Onset   Diabetes Other    Hyperlipidemia Other    Hypertension Other    Social History   Socioeconomic History   Marital status: Single    Spouse name: Not on file   Number of children: Not on file   Years of education: Not on file   Highest education level: Not on file  Occupational History   Not on file  Tobacco  Use   Smoking status: Never   Smokeless tobacco: Never  Vaping Use   Vaping Use: Never used  Substance and Sexual Activity   Alcohol use: No   Drug use: No   Sexual activity: Never  Other Topics Concern   Not on file  Social History Narrative   Not on file   Social Determinants of Health   Financial Resource Strain: Not on file  Food Insecurity: Not on file  Transportation Needs: Not on file  Physical Activity: Not on file  Stress: Not on file  Social Connections: Not on file   Past Surgical History:  Procedure Laterality Date   cramiectomy     CRANIOPLASTY     REMOVAL OF GASTROSTOMY TUBE     VENTRICULOPERITONEAL SHUNT     Past Medical History:  Diagnosis Date   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HCC)    Chronic hepatitis B (HCC)    Fixed pupils    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HCC)    Hypertension    Intracranial injury of other and unspecified nature, without mention of open intracranial wound, unspecified state of consciousness    Intracranial shunt    Paralysis (HCC)    Spastic hemiplegia affecting dominant side (HCC)  Stroke [facility])    Traumatic brain injury (HCC)    Trouble swallowing    Visual disturbance    Weakness    Temp 97.8 F (36.6 C)   Ht 5\' 6"  (1.676 m)   Wt 162 lb (73.5 kg)   BMI 26.15 kg/m   Opioid Risk Score:   Fall Risk Score:  `1  Depression screen PHQ 2/9  Depression screen [facility] 2/9 03/19/2021 10/02/2020 06/05/2020 04/03/2020 01/03/2020 11/29/2017 03/16/2017  Decreased Interest 1 0 0 0 0 0 1  Down, Depressed, Hopeless 1 0 0 0 0 0 1  PHQ - 2 Score 2 0 0 0 0 0 2  Altered sleeping - - - - 0 - -  Tired, decreased energy - - - - 0 - -  Change in appetite - - - - 0 - -  Feeling bad or failure about yourself  - - - - 0 - -  Trouble concentrating - - - - 0 - -  Moving slowly or fidgety/restless - - - - 0 - -  Suicidal thoughts - - - - 0 - -  PHQ-9 Score - - - - 0 - -  Some recent data might be hidden    Review of Systems   Musculoskeletal:  Positive for gait problem.       Problems with balance  All other systems reviewed and are negative.     Objective:   Physical Exam General: No acute distress HEENT: NCAT, EOMI, oral membranes moist Cards: reg rate  Chest: normal effort Abdomen: Soft, NT, ND Skin: dry, intact Extremities: no edema Psych: pleasant and appropriate  Skin: intact Neuro: Patient is alert and appropriate.  Speech is dysarthric.  Spastic right hemiparesis.  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  Right knee hyperextends as well.  There is a mild effusion at the right lateral ankle.  He is somewhat tender with weightbearing.  Spasticity is generally controlled although he is hyperreflexic on the right. Musculoskeletal: No pain with passive or active range of motion today in the upper or lower extremities.           Assessment & Plan:  ASSESSMENT:   1. History of traumatic brain injury with hydrocephalus, meningitis,   and right pontine stroke.   2. Spastic tetraplegia, dominant side more affected 3. severe oropharyngeal dysphagia--improved--tolerating diet well.  Continues on thickened liquids 4. Absence seizures 5. Mechanical Low back pain: due to posture/altered gait patterns       PLAN:   1.  High release decline in mobility is multifactorial.  He really has needed an AFO for some time.  Given that he was mobile and doing well without the brace he really shunned the one he had initially and has been walking with pronation in the foot as well as a hyperextended knee.  It is now causing some problems with knee controls and increased falls.  He is having pain at the knee and ankle as well.  We will make a referral to outpatient physical therapy to address his gait mechanics and to help find an AFO which works for him.  I did discuss with dad that any AFO he wears which provide some stability will sacrifice dynamic mobility.  Hopefully [facility] can come to therapy visits to  help work on a brace that is most efficacious for him.  Would also benefit from some aquatic therapy 2.      Spasticity has improved after Dysport injections.  No injections for now.  3.  Vimpat and keppra for seizure  4. tylenol or voltaren for pain   Fifteen minutes of face to face patient care time were spent during this visit. All questions were encouraged and answered.  Follow up with me in 2 mos .

## 2021-04-03 ENCOUNTER — Ambulatory Visit: Payer: BC Managed Care – PPO | Attending: Physical Medicine & Rehabilitation

## 2021-04-03 ENCOUNTER — Other Ambulatory Visit: Payer: Self-pay

## 2021-04-03 DIAGNOSIS — R2689 Other abnormalities of gait and mobility: Secondary | ICD-10-CM | POA: Diagnosis present

## 2021-04-03 DIAGNOSIS — R2681 Unsteadiness on feet: Secondary | ICD-10-CM | POA: Diagnosis not present

## 2021-04-03 DIAGNOSIS — M6281 Muscle weakness (generalized): Secondary | ICD-10-CM | POA: Diagnosis present

## 2021-04-04 NOTE — Therapy (Signed)
[facility] [address] [place], [place], [address] Phone: [phone]   Fax:  [phone]  Physical Therapy Evaluation  Patient Details  Name: Paul Kane MRN: [id number] Date of Birth: March 04, 1986 Referring Provider (PT): Faith Rogue MD   Encounter Date: 04/03/2021   PT End of Session - 04/04/21 0806     Visit Number 1    Number of Visits 13    Date for PT Re-Evaluation 06/06/21    Authorization Type BCBS    Authorization Time Period 04/03/21-06/06/21    Progress Note Due on Visit 10    PT Start Time 1545    PT Stop Time 1615    PT Time Calculation (min) 30 min    Equipment Utilized During Treatment Gait belt    Activity Tolerance Patient tolerated treatment well    Behavior During Therapy WFL for tasks assessed/performed             Past Medical History:  Diagnosis Date   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HCC)    Chronic hepatitis B (HCC)    Fixed pupils    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HCC)    Hypertension    Intracranial injury of other and unspecified nature, without mention of open intracranial wound, unspecified state of consciousness    Intracranial shunt    Paralysis (HCC)    Spastic hemiplegia affecting dominant side (HCC)    Stroke (HCC)    Traumatic brain injury (HCC)    Trouble swallowing    Visual disturbance    Weakness     Past Surgical History:  Procedure Laterality Date   cramiectomy     CRANIOPLASTY     REMOVAL OF GASTROSTOMY TUBE     VENTRICULOPERITONEAL SHUNT      There were no vitals filed for this visit.    Subjective Assessment - 04/03/21 0802     Subjective Patient returns to OPPT following a decline in function over suspected SIRS exposure.  He has been fitted for a R AFO but is having difficulty with proper fit.  He has been having difficulty returning to ambulation dur to chronic R foot inversion resultant of CP    Patient is accompained by: Family  member    Pertinent History Hx of CP    How long can you sit comfortably? unlimited    How long can you stand comfortably? <10 min    How long can you walk comfortably? <5 min    Currently in Pain? No/denies    Pain Onset Paul Kane PT Assessment - 04/04/21 0001       Assessment   Medical Diagnosis CP    Referring Provider (PT) Faith Rogue MD    Next MD Visit 6 mos    Prior Therapy OPPT      Precautions   Precautions Fall    Required Braces or Orthoses Other Brace/Splint      Restrictions   Weight Bearing Restrictions No      Balance Screen   Has the patient fallen in the past 6 months Yes    How many times? 2    Has the patient had a decrease in activity level because of a fear of falling?  Yes    Is the patient reluctant to leave their home because of a fear of falling?  Yes      Home Environment   Living  Environment Private residence    Living Arrangements Parent    Available Help at Discharge Family    Type of Home House    Home Access Stairs to enter    Home Layout Two level      Prior Function   Level of Independence Needs assistance with ADLs;Needs assistance with gait    Vocation On disability      Sensation   Light Touch Appears Intact      Coordination   Gross Motor Movements are Fluid and Coordinated No                        Objective measurements completed on examination: See above findings.                  PT Short Term Goals - 04/04/21 0827       PT SHORT TERM GOAL #1   Title Patient to demo I in initial HEP    Baseline TBD    Time 4    Period Weeks    Status New    Target Date 05/09/21      PT SHORT TERM GOAL #2   Title Patient to perform 5x STS in 12 s or less    Baseline 15.9s    Time 4    Period Weeks    Status New    Target Date 05/09/21      PT SHORT TERM GOAL #3   Title Assess gait velocity and set goal    Baseline TBD    Time 4    Period Weeks    Status New     Target Date 05/09/21      PT SHORT TERM GOAL #4   Title Assess DGI and set Goal    Baseline TBD    Time 4    Period Weeks    Status New    Target Date 05/09/21               PT Long Term Goals - 04/04/21 0830       PT LONG TERM GOAL #1   Title The patient will demonstrate initiation and completion of HEP with supervision from caregiver at home    Baseline TBD    Time 8    Period Weeks    Status New    Target Date 06/06/21      PT LONG TERM GOAL #2   Title Patient to regain 5d R DF AROM    Baseline 0d AROM R DF    Time 8    Period Weeks    Status New    Target Date 06/06/21      PT LONG TERM GOAL #3   Title The patient will ambulate 500' outdoors mod I over varying surfaces and surface heights including grass, curbs, and inclines to demonstrate improved functional mobility with community ambulation.    Baseline 156ft in clinic with CGA    Time 8    Period Weeks    Status New    Target Date 06/06/21      PT LONG TERM GOAL #4   Title Assess DGI goal    Baseline TBD    Time 8    Period Weeks    Status New    Target Date 06/06/21      PT LONG TERM GOAL #5   Title Decrease TUG score to 12s    Baseline 15.9s initially    Time 8  Period Weeks    Status New    Target Date 06/06/21      Additional Long Term Goals   Additional Long Term Goals Yes      PT LONG TERM GOAL #6   Title Increase R DF, Knee extension, hip flexion and abduction to 4/5    Baseline 3+/5 throughout    Time 8    Period Weeks    Status New    Target Date 06/06/21                    Plan - 04/03/21 0811     Clinical Impression Statement (Patient 15 min late for session) Patient returns to OPPT due to decline in function and gait resulting in frequent falls.  He has a hx of CP resultng in a equinovarus deformity of his R ankle and has been fitted for an AFO but is having issues with the fit.  He denies pain in ankle/knee on R.  He is able to transfer with S and scores  14.1s on standard TUG test and scores 15.9 s on 5x STS both are below norms.  Patient is currently udergoing adjustments to R AFO.  He i a good candidate for OPPT to reduce fall risk    Personal Factors and Comorbidities Comorbidity 1    Comorbidities CP    Examination-Activity Limitations Locomotion Level    Stability/Clinical Decision Making Stable/Uncomplicated    Rehab Potential Good    PT Frequency 1x / week    PT Duration 6 weeks    PT Treatment/Interventions ADLs/Self Care Home Management;Aquatic Therapy;DME Instruction;Gait training;Stair training;Functional mobility training;Therapeutic activities;Therapeutic exercise;Balance training;Neuromuscular re-education;Patient/family education;Orthotic Fit/Training;Manual techniques    PT Next Visit Plan DGI, gait velocity, HEP    PT Home Exercise Plan TBD             Patient will benefit from skilled therapeutic intervention in order to improve the following deficits and impairments:  Abnormal gait, Decreased endurance, Decreased activity tolerance, Decreased strength, Decreased balance, Decreased mobility, Difficulty walking, Increased muscle spasms, Decreased range of motion, Decreased coordination  Visit Diagnosis: Unsteadiness on feet  Muscle weakness (generalized)  Other abnormalities of gait and mobility     Problem List Patient Active Problem List   Diagnosis Date Noted   SIRS due to infectious process with acute organ dysfunction (HCC) 03/14/2021   SIRS (systemic inflammatory response syndrome) (HCC) 03/12/2021   Syncope 03/12/2021   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) 03/12/2021   Hypokalemia 03/12/2021   Lactic acidosis 03/12/2021   Mixed hyperlipidemia 04/25/2018   Seizure disorder (HCC) 04/11/2018   Mechanical low back pain 05/12/2017   Dysarthria 12/20/2015   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/20/2012   Deafness, sensorineural 04/20/2012   Buzzing in ear 04/20/2012   Leaking percutaneous  endoscopic gastrostomy (PEG) tube (HCC) 02/04/2012   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 pre-existing conscious levels, sequela (HCC) 10/12/2011   Cerebral infarct (HCC) 10/12/2011   Spastic hemiplegia of right dominant side due to noncerebrovascular etiology (HCC) 10/12/2011   Dysphagia 10/12/2011   Functioning G-tube 03/24/2011   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 [place], [place], [address] Phone: [phone]   Fax:  [phone]  Name: Paul Kane MRN: [id number] Date of Birth: 06/01/1986

## 2021-04-07 ENCOUNTER — Other Ambulatory Visit: Payer: Self-pay

## 2021-04-07 ENCOUNTER — Ambulatory Visit: Payer: BC Managed Care – PPO

## 2021-04-07 DIAGNOSIS — R2681 Unsteadiness on feet: Secondary | ICD-10-CM | POA: Diagnosis not present

## 2021-04-07 DIAGNOSIS — M6281 Muscle weakness (generalized): Secondary | ICD-10-CM

## 2021-04-07 DIAGNOSIS — R2689 Other abnormalities of gait and mobility: Secondary | ICD-10-CM

## 2021-04-07 NOTE — Therapy (Signed)
[facility] [address] [place], [place], [address] Phone: [phone]   Fax:  [phone]  Physical Therapy Treatment  Patient Details  Name: Paul Kane MRN: [id number] Date of Birth: 03-May-1986 Referring Provider (PT): Faith Rogue MD   Encounter Date: 04/07/2021   PT End of Session - 04/07/21 1704     Visit Number 2    Number of Visits 13    Date for PT Re-Evaluation 06/06/21    Authorization Type BCBS    Authorization Time Period 04/03/21-06/06/21    Progress Note Due on Visit 10    PT Start Time 1545    PT Stop Time 1615    PT Time Calculation (min) 30 min    Equipment Utilized During Treatment Gait belt    Activity Tolerance Patient tolerated treatment well    Behavior During Therapy WFL for tasks assessed/performed             Past Medical History:  Diagnosis Date   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HCC)    Chronic hepatitis B (HCC)    Fixed pupils    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HCC)    Hypertension    Intracranial injury of other and unspecified nature, without mention of open intracranial wound, unspecified state of consciousness    Intracranial shunt    Paralysis (HCC)    Spastic hemiplegia affecting dominant side (HCC)    Stroke (HCC)    Traumatic brain injury (HCC)    Trouble swallowing    Visual disturbance    Weakness     Past Surgical History:  Procedure Laterality Date   cramiectomy     CRANIOPLASTY     REMOVAL OF GASTROSTOMY TUBE     VENTRICULOPERITONEAL SHUNT      There were no vitals filed for this visit.   Subjective Assessment - 04/07/21 1703     Subjective No changes to report, has not yet heard back from [facility]    Patient is accompained by: Family member    Pertinent History Hx of CP    How long can you sit comfortably? unlimited    How long can you stand comfortably? <10 min    How long can you walk comfortably? <5 min    Currently in Pain? No/denies     Pain Onset Yesterday                               [facility] Adult PT Treatment/Exercise - 04/07/21 0001       Transfers   Transfers Sit to Stand    Number of Reps 10 reps    Comments no UE support, from airex pad      Ambulation/Gait   Ambulation/Gait Yes    Ambulation/Gait Assistance 5: Supervision;4: Min guard    Ambulation Distance (Feet) 100 Feet    Assistive device None    Gait Pattern Abducted - left;Abducted- right;Wide base of support;Decreased trunk rotation;Ataxic;Right genu recurvatum      Knee/Hip Exercises: Aerobic   Stepper Scifit seat 16, no UEs, L4 x8'      Manual Therapy   Manual Therapy Passive ROM    Manual therapy comments Passive stretch to R hamstrings and heel cord, 2' static hold ea.                 Balance Exercises - 04/07/21 0001       Balance Exercises: Standing   Standing  Eyes Opened Wide (BOA);Solid surface;30 secs    Standing Eyes Closed Wide (BOA);Solid surface;30 secs                  PT Short Term Goals - 04/04/21 0827       PT SHORT TERM GOAL #1   Title Patient to demo I in initial HEP    Baseline TBD    Time 4    Period Weeks    Status New    Target Date 05/09/21      PT SHORT TERM GOAL #2   Title Patient to perform 5x STS in 12 s or less    Baseline 15.9s    Time 4    Period Weeks    Status New    Target Date 05/09/21      PT SHORT TERM GOAL #3   Title Assess gait velocity and set goal    Baseline TBD    Time 4    Period Weeks    Status New    Target Date 05/09/21      PT SHORT TERM GOAL #4   Title Assess DGI and set Goal    Baseline TBD    Time 4    Period Weeks    Status New    Target Date 05/09/21               PT Long Term Goals - 04/04/21 0830       PT LONG TERM GOAL #1   Title The patient will demonstrate initiation and completion of HEP with supervision from caregiver at home    Baseline TBD    Time 8    Period Weeks    Status New    Target Date  06/06/21      PT LONG TERM GOAL #2   Title Patient to regain 5d R DF AROM    Baseline 0d AROM R DF    Time 8    Period Weeks    Status New    Target Date 06/06/21      PT LONG TERM GOAL #3   Title The patient will ambulate 500' outdoors mod I over varying surfaces and surface heights including grass, curbs, and inclines to demonstrate improved functional mobility with community ambulation.    Baseline 164ft in clinic with CGA    Time 8    Period Weeks    Status New    Target Date 06/06/21      PT LONG TERM GOAL #4   Title Assess DGI goal    Baseline TBD    Time 8    Period Weeks    Status New    Target Date 06/06/21      PT LONG TERM GOAL #5   Title Decrease TUG score to 12s    Baseline 15.9s initially    Time 8    Period Weeks    Status New    Target Date 06/06/21      Additional Long Term Goals   Additional Long Term Goals Yes      PT LONG TERM GOAL #6   Title Increase R DF, Knee extension, hip flexion and abduction to 4/5    Baseline 3+/5 throughout    Time 8    Period Weeks    Status New    Target Date 06/06/21                   Plan - 04/07/21 1601  Clinical Impression Statement (15 min late starting session)  Todays session focused on transfer training from compliant surface.  Aerobic conditioning on Scifit w/o UE and PROM to R hamstrings and heelcord.  Continues to requires need of R AFO to control R ankle inversion/PF deformity.  Able to perform stepper for 8 minutes LE only w/o need of recovery period.  Patient remains a good candiate for PT to improve RLE strength and control combined with AFO.    Personal Factors and Comorbidities Comorbidity 1    Comorbidities CP    Examination-Activity Limitations Locomotion Level    Stability/Clinical Decision Making Stable/Uncomplicated    Rehab Potential Good    PT Frequency 1x / week    PT Duration 6 weeks    PT Treatment/Interventions ADLs/Self Care Home Management;Aquatic Therapy;DME  Instruction;Gait training;Stair training;Functional mobility training;Therapeutic activities;Therapeutic exercise;Balance training;Neuromuscular re-education;Patient/family education;Orthotic Fit/Training;Manual techniques    PT Next Visit Plan DGI, gait velocity, HEP    PT Home Exercise Plan TBD             Patient will benefit from skilled therapeutic intervention in order to improve the following deficits and impairments:  Abnormal gait, Decreased endurance, Decreased activity tolerance, Decreased strength, Decreased balance, Decreased mobility, Difficulty walking, Increased muscle spasms, Decreased range of motion, Decreased coordination  Visit Diagnosis: Unsteadiness on feet  Muscle weakness (generalized)  Other abnormalities of gait and mobility     Problem List Patient Active Problem List   Diagnosis Date Noted   SIRS due to infectious process with acute organ dysfunction (HCC) 03/14/2021   SIRS (systemic inflammatory response syndrome) (HCC) 03/12/2021   Syncope 03/12/2021   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) 03/12/2021   Hypokalemia 03/12/2021   Lactic acidosis 03/12/2021   Mixed hyperlipidemia 04/25/2018   Seizure disorder (HCC) 04/11/2018   Mechanical low back pain 05/12/2017   Dysarthria 12/20/2015   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/20/2012   Deafness, sensorineural 04/20/2012   Buzzing in ear 04/20/2012   Leaking percutaneous endoscopic gastrostomy (PEG) tube (HCC) 02/04/2012   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 pre-existing conscious levels, sequela (HCC) 10/12/2011   Cerebral infarct (HCC) 10/12/2011   Spastic hemiplegia of right dominant side due to noncerebrovascular etiology (HCC) 10/12/2011   Dysphagia 10/12/2011   Functioning G-tube 03/24/2011   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   Hemiplegia of dominant side (HCC) 07/28/2010   CEREBRAL HEMORRHAGE 07/28/2010   PERSONAL HISTORY  OF TRAUMATIC BRAIN INJURY 07/02/2010   ALLERGIC RHINITIS CAUSE UNSPECIFIED 10/25/2008   GASTROENTERITIS 08/09/2008   CONTACT DERMATITIS&OTHER ECZEMA DUE DETERGENTS 08/18/2007   HEPATITIS B, CHRONIC 06/28/2007   Chronic viral hepatitis B without delta-agent (HCC) 06/28/2007    Hildred Laser, PT 04/07/2021, 5:14 PM  [facility] [address] [place], [place], [address] Phone: [phone]   Fax:  [phone]  Name: Paul Kane MRN: [id number] Date of Birth: 1986/04/10

## 2021-04-10 ENCOUNTER — Other Ambulatory Visit: Payer: Self-pay

## 2021-04-10 ENCOUNTER — Ambulatory Visit (HOSPITAL_BASED_OUTPATIENT_CLINIC_OR_DEPARTMENT_OTHER): Payer: BC Managed Care – PPO | Attending: Physical Medicine & Rehabilitation | Admitting: Physical Therapy

## 2021-04-10 DIAGNOSIS — M6281 Muscle weakness (generalized): Secondary | ICD-10-CM | POA: Diagnosis present

## 2021-04-10 DIAGNOSIS — R2689 Other abnormalities of gait and mobility: Secondary | ICD-10-CM | POA: Diagnosis present

## 2021-04-10 DIAGNOSIS — R2681 Unsteadiness on feet: Secondary | ICD-10-CM | POA: Diagnosis present

## 2021-04-10 NOTE — Therapy (Signed)
[facility] [address] [place], [place], [address] Phone: [phone]   Fax:  [phone]  Physical Therapy Treatment  Patient Details  Name: Paul Kane MRN: [id number] Date of Birth: [birth year] Referring Provider (PT): Faith Rogue MD   Encounter Date: 04/10/2021   PT End of Session - 04/10/21 1512     Visit Number 3    Number of Visits 13    Date for PT Re-Evaluation 06/06/21    Authorization Type BCBS    Authorization Time Period 04/03/21-06/06/21    Progress Note Due on Visit 10    PT Start Time 0900    PT Stop Time 0945    PT Time Calculation (min) 45 min    Equipment Utilized During Treatment Gait belt    Activity Tolerance Patient tolerated treatment well    Behavior During Therapy WFL for tasks assessed/performed             Past Medical History:  Diagnosis Date   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HCC)    Chronic hepatitis B (HCC)    Fixed pupils    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HCC)    Hypertension    Intracranial injury of other and unspecified nature, without mention of open intracranial wound, unspecified state of consciousness    Intracranial shunt    Paralysis (HCC)    Spastic hemiplegia affecting dominant side (HCC)    Stroke (HCC)    Traumatic brain injury (HCC)    Trouble swallowing    Visual disturbance    Weakness     Past Surgical History:  Procedure Laterality Date   cramiectomy     CRANIOPLASTY     REMOVAL OF GASTROSTOMY TUBE     VENTRICULOPERITONEAL SHUNT      There were no vitals filed for this visit.   Subjective Assessment - 04/10/21 1511     Subjective Pt reports apprehension with getting in pool although parents present and are encouraging.  Paul Kane.                                        PT Education - 04/10/21 1511     Education Details Aquatics, properties of water and benefits.     Person(s) Educated Patient;Parent(s)    Methods Explanation;Demonstration    Comprehension Verbalized understanding              PT Short Term Goals - 04/04/21 0827       PT SHORT TERM GOAL #1   Title Patient to demo I in initial HEP    Baseline TBD    Time 4    Period Weeks    Status New    Target Date 05/09/21      PT SHORT TERM GOAL #2   Title Patient to perform 5x STS in 12 s or less    Baseline 15.9s    Time 4    Period Weeks    Status New    Target Date 05/09/21      PT SHORT TERM GOAL #3   Title Assess gait velocity and set goal    Baseline TBD    Time 4    Period Weeks    Status New    Target Date 05/09/21      PT SHORT TERM GOAL #4   Title Assess DGI and set Goal  Baseline TBD    Time 4    Period Weeks    Status New    Target Date 05/09/21               PT Long Term Goals - 04/04/21 0830       PT LONG TERM GOAL #1   Title The patient will demonstrate initiation and completion of HEP with supervision from caregiver at home    Baseline TBD    Time 8    Period Weeks    Status New    Target Date 06/06/21      PT LONG TERM GOAL #2   Title Patient to regain 5d R DF AROM    Baseline 0d AROM R DF    Time 8    Period Weeks    Status New    Target Date 06/06/21      PT LONG TERM GOAL #3   Title The patient will ambulate 500' outdoors mod I over varying surfaces and surface heights including grass, curbs, and inclines to demonstrate improved functional mobility with community ambulation.    Baseline 19ft in clinic with CGA    Time 8    Period Weeks    Status New    Target Date 06/06/21      PT LONG TERM GOAL #4   Title Assess DGI goal    Baseline TBD    Time 8    Period Weeks    Status New    Target Date 06/06/21      PT LONG TERM GOAL #5   Title Decrease TUG score to 12s    Baseline 15.9s initially    Time 8    Period Weeks    Status New    Target Date 06/06/21      Additional Long Term Goals   Additional Long Term  Goals Yes      PT LONG TERM GOAL #6   Title Increase R DF, Knee extension, hip flexion and abduction to 4/5    Baseline 3+/5 throughout    Time 8    Period Weeks    Status New    Target Date 06/06/21            Pt seen for aquatic therapy today.  Treatment took place in water 3.25-4.8 ft in depth at the [facility] pool. Temp of water was 91.  Pt entered/exited the pool via stairs step to pattern cga with bilat rail.   Pt introduced to aquatic setting walking using water walker throughout all depths of pool. Pt requiring mod assist initially to maintain upright position submerged at 4 ft. Improves with continued amb back and forth 3 ft- 4.89ft. Pt directed in forward and backward amb as well as side stepping. Cues for increased step length and righting.  Balance challenges as pt becomes better adjusted to setting, therapist allowing pt to regain positioning with vc but without manual assist. Step ups completed submerged 80% leading with each LE onto water step x 10 reps. Pt initiate holding to pool wall then is encouraged to complete supported by yellow noodle. Cues and manual assist throughout. Backward step ups supported by noodle x 5 R/L with Kane mod-min assist for balance.  Seated manual stretching right foot into eversion and pronation. Knee flex/ext 2 x 10 reps cuing for increased speed as able.  Tone rle limits range with increased speed.  Stair negotiation Using bilat handrails combination of step to and alternating pattern with cga. VC  for control rle knee into extension.  Pt requires buoyancy for support and to offload joints with strengthening exercises. Viscosity of the water is needed for resistance of strengthening; water current perturbations provides challenge to standing balance unsupported, requiring increased core activation.        Plan - 04/10/21 1513     Clinical Impression Statement Focus of treatment on introducing pt to setting,  balance  challenges and stretching. Pt able to adjust well to buoyancy and  body positioning submerged.  Initially using water walker able to progress to ue support of yellow noodle then hha. Pt with increased Kane righting/standing erect in 4 ft, improves with less submersion.   VC and TC for balancing with minor perturbations (other people in pool). Able to get good manual stretching of right ankle/foot into eversion and pronation with pt seated and submerged in warm water.  No c/o pain. Stair negotiation with cga in and out of pool.  Pt tolerates very well. Good candidate for aquatics for overall strengthening, balance and aerobic capacity improvements.    Stability/Clinical Decision Making Stable/Uncomplicated    Rehab Potential Good    PT Frequency 1x / week    PT Duration 6 weeks    PT Treatment/Interventions ADLs/Self Care Home Management;Aquatic Therapy;DME Instruction;Gait training;Stair training;Functional mobility training;Therapeutic activities;Therapeutic exercise;Balance training;Neuromuscular re-education;Patient/family education;Orthotic Fit/Training;Manual techniques    PT Next Visit Plan core strengthening and balance    PT Home Exercise Plan TBD             Patient will benefit from skilled therapeutic intervention in order to improve the following deficits and impairments:  Abnormal gait, Decreased endurance, Decreased activity tolerance, Decreased strength, Decreased balance, Decreased mobility, Kane walking, Increased muscle spasms, Decreased range of motion, Decreased coordination  Visit Diagnosis: Unsteadiness on feet  Muscle weakness (generalized)  Other abnormalities of gait and mobility     Problem List Patient Active Problem List   Diagnosis Date Noted   SIRS due to infectious process with acute organ dysfunction (HCC) 03/14/2021   SIRS (systemic inflammatory response syndrome) (HCC) 03/12/2021   Syncope 03/12/2021   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) 03/12/2021   Hypokalemia 03/12/2021   Lactic acidosis 03/12/2021   Mixed hyperlipidemia 04/25/2018   Seizure disorder (HCC) 04/11/2018   Mechanical low back pain 05/12/2017   Dysarthria 12/20/2015   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/20/2012   Deafness, sensorineural 04/20/2012   Buzzing in ear 04/20/2012   Leaking percutaneous endoscopic gastrostomy (PEG) tube (HCC) 02/04/2012   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 pre-existing conscious levels, sequela (HCC) 10/12/2011   Cerebral infarct (HCC) 10/12/2011   Spastic hemiplegia of right dominant side due to noncerebrovascular etiology (HCC) 10/12/2011   Dysphagia 10/12/2011   Functioning G-tube 03/24/2011   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 [place], [place], [address] Phone: [phone]   Fax:  [phone]  Name: Paul Kane MRN: [id number] Date of Birth: 19-May-1986

## 2021-04-16 ENCOUNTER — Ambulatory Visit (HOSPITAL_BASED_OUTPATIENT_CLINIC_OR_DEPARTMENT_OTHER): Payer: BC Managed Care – PPO | Admitting: Physical Therapy

## 2021-04-16 ENCOUNTER — Encounter (HOSPITAL_BASED_OUTPATIENT_CLINIC_OR_DEPARTMENT_OTHER): Payer: Self-pay | Admitting: Physical Therapy

## 2021-04-16 ENCOUNTER — Other Ambulatory Visit: Payer: Self-pay

## 2021-04-16 ENCOUNTER — Ambulatory Visit: Payer: BC Managed Care – PPO | Admitting: Physical Medicine & Rehabilitation

## 2021-04-16 DIAGNOSIS — R2689 Other abnormalities of gait and mobility: Secondary | ICD-10-CM

## 2021-04-16 DIAGNOSIS — R2681 Unsteadiness on feet: Secondary | ICD-10-CM

## 2021-04-16 DIAGNOSIS — M6281 Muscle weakness (generalized): Secondary | ICD-10-CM

## 2021-04-16 NOTE — Therapy (Signed)
[facility] [address] [place], [place], [address] Phone: [phone]   Fax:  [phone]  Physical Therapy Treatment  Patient Details  Name: Paul Kane MRN: [id number] Date of Birth: 10-19-1985 Referring Provider (PT): Faith Rogue MD   Encounter Date: 04/16/2021   PT End of Session - 04/16/21 0824     Visit Number 4    Number of Visits 13    Date for PT Re-Evaluation 06/06/21    Authorization Type BCBS    Authorization Time Period 04/03/21-06/06/21    Progress Note Due on Visit 10    PT Start Time 0824    PT Stop Time 0909    PT Time Calculation (min) 45 min    Equipment Utilized During Treatment Gait belt    Activity Tolerance Patient tolerated treatment well    Behavior During Therapy WFL for tasks assessed/performed             Past Medical History:  Diagnosis Date   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HCC)    Chronic hepatitis B (HCC)    Fixed pupils    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HCC)    Hypertension    Intracranial injury of other and unspecified nature, without mention of open intracranial wound, unspecified state of consciousness    Intracranial shunt    Paralysis (HCC)    Spastic hemiplegia affecting dominant side (HCC)    Stroke (HCC)    Traumatic brain injury (HCC)    Trouble swallowing    Visual disturbance    Weakness     Past Surgical History:  Procedure Laterality Date   cramiectomy     CRANIOPLASTY     REMOVAL OF GASTROSTOMY TUBE     VENTRICULOPERITONEAL SHUNT      There were no vitals filed for this visit.   Subjective Assessment - 04/16/21 1404     Subjective Parents report pt has some muscle soarness after last aquatic session. No complaints or concerns.    Currently in Pain? No/denies                                          PT Short Term Goals - 04/04/21 0827       PT SHORT TERM GOAL #1   Title Patient to demo I in initial  HEP    Baseline TBD    Time 4    Period Weeks    Status New    Target Date 05/09/21      PT SHORT TERM GOAL #2   Title Patient to perform 5x STS in 12 s or less    Baseline 15.9s    Time 4    Period Weeks    Status New    Target Date 05/09/21      PT SHORT TERM GOAL #3   Title Assess gait velocity and set goal    Baseline TBD    Time 4    Period Weeks    Status New    Target Date 05/09/21      PT SHORT TERM GOAL #4   Title Assess DGI and set Goal    Baseline TBD    Time 4    Period Weeks    Status New    Target Date 05/09/21               PT Long Term Goals -  04/04/21 0830       PT LONG TERM GOAL #1   Title The patient will demonstrate initiation and completion of HEP with supervision from caregiver at home    Baseline TBD    Time 8    Period Weeks    Status New    Target Date 06/06/21      PT LONG TERM GOAL #2   Title Patient to regain 5d R DF AROM    Baseline 0d AROM R DF    Time 8    Period Weeks    Status New    Target Date 06/06/21      PT LONG TERM GOAL #3   Title The patient will ambulate 500' outdoors mod I over varying surfaces and surface heights including grass, curbs, and inclines to demonstrate improved functional mobility with community ambulation.    Baseline 116ft in clinic with CGA    Time 8    Period Weeks    Status New    Target Date 06/06/21      PT LONG TERM GOAL #4   Title Assess DGI goal    Baseline TBD    Time 8    Period Weeks    Status New    Target Date 06/06/21      PT LONG TERM GOAL #5   Title Decrease TUG score to 12s    Baseline 15.9s initially    Time 8    Period Weeks    Status New    Target Date 06/06/21      Additional Long Term Goals   Additional Long Term Goals Yes      PT LONG TERM GOAL #6   Title Increase R DF, Knee extension, hip flexion and abduction to 4/5    Baseline 3+/5 throughout    Time 8    Period Weeks    Status New    Target Date 06/06/21           Pt seen for aquatic  therapy today.  Treatment took place in water 3.25-4.8 ft in depth at the [facility] pool. Temp of water was 91.  Pt entered/exited the pool via stairs step to pattern cga with bilat rail.     Pt introduced to aquatic setting walking using water walker throughout all depths of pool. Pt requiring mod assist initially to maintain upright position submerged at 4 ft. Improves with continued amb back and forth 3 ft- 4.75ft. Pt directed in forward and backward amb as well as side stepping. Cues for increased step length and righting.   Balance challenges as pt becomes better adjusted to setting, therapist allowing pt to regain positioning with vc but without manual assist. Step ups completed submerged 80% leading with each LE onto water step x 10 reps. Pt initiate holding to pool wall then is encouraged to complete supported by yellow noodle. Cues and manual assist throughout. Backward step ups supported by noodle x 5 R/L with difficulty mod-min assist for balance.   Seated manual stretching right foot into eversion and pronation. Knee flex/ext 2 x 10 reps cuing for increased speed as able.  Tone rle limits range with increased speed.   Stair negotiation Using bilat handrails combination of step to and alternating pattern with cga. VC for control rle knee into extension.   Pt requires buoyancy for support and to offload joints with strengthening exercises. Viscosity of the water is needed for resistance of strengthening; water current perturbations provides challenge to standing balance  unsupported, requiring increased core activation.         Plan - 04/16/21 1405     Clinical Impression Statement Advanced trials of balance challenges and strengthening with submersion in 3-4.8 ft. Pt without apprehension and appears to be enjoying treatment. Able to position pt in water walker and allow for self correction with LOB or over shifting of weight particularly posteriorly with amb forward and  back, as well as turning and side stepping. He does c/o some right  knee pain upon completion of session as we did step up/down exercises requiring vc and manual assit to counter hyper extension of knee.    Stability/Clinical Decision Making Stable/Uncomplicated    Rehab Potential Good    PT Frequency 1x / week    PT Duration 6 weeks    PT Treatment/Interventions ADLs/Self Care Home Management;Aquatic Therapy;DME Instruction;Gait training;Stair training;Functional mobility training;Therapeutic activities;Therapeutic exercise;Balance training;Neuromuscular re-education;Patient/family education;Orthotic Fit/Training;Manual techniques    PT Next Visit Plan core strengthening and balance             Patient will benefit from skilled therapeutic intervention in order to improve the following deficits and impairments:  Abnormal gait, Decreased endurance, Decreased activity tolerance, Decreased strength, Decreased balance, Decreased mobility, Difficulty walking, Increased muscle spasms, Decreased range of motion, Decreased coordination  Visit Diagnosis: Unsteadiness on feet  Muscle weakness (generalized)  Other abnormalities of gait and mobility     Problem List Patient Active Problem List   Diagnosis Date Noted   SIRS due to infectious process with acute organ dysfunction (HCC) 03/14/2021   SIRS (systemic inflammatory response syndrome) (HCC) 03/12/2021   Syncope 03/12/2021   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) 03/12/2021   Hypokalemia 03/12/2021   Lactic acidosis 03/12/2021   Mixed hyperlipidemia 04/25/2018   Seizure disorder (HCC) 04/11/2018   Mechanical low back pain 05/12/2017   Dysarthria 12/20/2015   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/20/2012   Deafness, sensorineural 04/20/2012   Buzzing in ear 04/20/2012   Leaking percutaneous endoscopic gastrostomy (PEG) tube (HCC) 02/04/2012   Diffuse traumatic brain injury with loss of consciousness greater than 24 hours with  return to pre-existing conscious levels, sequela (HCC) 10/12/2011   Cerebral infarct (HCC) 10/12/2011   Spastic hemiplegia of right dominant side due to noncerebrovascular etiology (HCC) 10/12/2011   Dysphagia 10/12/2011   Functioning G-tube 03/24/2011   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   Hemiplegia of dominant side (HCC) 07/28/2010   CEREBRAL HEMORRHAGE 07/28/2010   PERSONAL HISTORY OF TRAUMATIC BRAIN INJURY 07/02/2010   ALLERGIC RHINITIS CAUSE UNSPECIFIED 10/25/2008   GASTROENTERITIS 08/09/2008   CONTACT DERMATITIS&OTHER ECZEMA DUE DETERGENTS 08/18/2007   HEPATITIS B, CHRONIC 06/28/2007   Chronic viral hepatitis B without delta-agent (HCC) 06/28/2007    Jeanmarie Hubert, PT 04/16/2021, 2:11 PM  [facility] [address] [place], [place], [address] Phone: [phone]   Fax:  [phone]  Name: Paul Kane MRN: [id number] Date of Birth: Jul 16, 1986

## 2021-04-17 ENCOUNTER — Ambulatory Visit: Payer: BC Managed Care – PPO

## 2021-04-17 DIAGNOSIS — R2681 Unsteadiness on feet: Secondary | ICD-10-CM | POA: Diagnosis not present

## 2021-04-17 DIAGNOSIS — R2689 Other abnormalities of gait and mobility: Secondary | ICD-10-CM

## 2021-04-17 DIAGNOSIS — M6281 Muscle weakness (generalized): Secondary | ICD-10-CM

## 2021-04-17 NOTE — Therapy (Signed)
[facility] [address] [place], [place], [address] Phone: [phone]   Fax:  [phone]  Physical Therapy Treatment  Patient Details  Name: Paul Kane MRN: [id number] Date of Birth: 01-Jul-1986 Referring Provider (PT): Faith Rogue MD   Encounter Date: 04/17/2021   PT End of Session - 04/17/21 1321     Visit Number 5    Number of Visits 13    Date for PT Re-Evaluation 06/06/21    Authorization Type BCBS    Authorization Time Period 04/03/21-06/06/21    Progress Note Due on Visit 10    PT Start Time 1155    PT Stop Time 1230    PT Time Calculation (min) 35 min    Equipment Utilized During Treatment Gait belt    Activity Tolerance Patient tolerated treatment well    Behavior During Therapy WFL for tasks assessed/performed             Past Medical History:  Diagnosis Date   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HCC)    Chronic hepatitis B (HCC)    Fixed pupils    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HCC)    Hypertension    Intracranial injury of other and unspecified nature, without mention of open intracranial wound, unspecified state of consciousness    Intracranial shunt    Paralysis (HCC)    Spastic hemiplegia affecting dominant side (HCC)    Stroke (HCC)    Traumatic brain injury (HCC)    Trouble swallowing    Visual disturbance    Weakness     Past Surgical History:  Procedure Laterality Date   cramiectomy     CRANIOPLASTY     REMOVAL OF GASTROSTOMY TUBE     VENTRICULOPERITONEAL SHUNT      There were no vitals filed for this visit.   Subjective Assessment - 04/17/21 1159     Subjective Has obtained R AFO but continues to hyperextend R knee.  Apprehension noted about pool therapy but attends all appointmnts    Patient is accompained by: Family member    Pertinent History Hx of CP    How long can you sit comfortably? unlimited    How long can you stand comfortably? <10 min    How  long can you walk comfortably? <5 min    Pain Onset Yesterday                [facility] PT Assessment - 04/17/21 0001       Dynamic Gait Index   Level Surface Mild Impairment    Change in Gait Speed Mild Impairment    Gait with Horizontal Head Turns Mild Impairment    Gait with Vertical Head Turns Mild Impairment    Gait and Pivot Turn Normal    Step Over Obstacle Mild Impairment    Step Around Obstacles Mild Impairment    Steps Mild Impairment    Total Score 17                           [facility] Adult PT Treatment/Exercise - 04/17/21 0001       Transfers   Transfers Sit to Stand;Stand to Sit    Number of Reps 10 reps    Comments from airex with CGA      Ambulation/Gait   Ambulation/Gait Yes    Ambulation/Gait Assistance 4: Min guard    Ambulation Distance (Feet) 115 Feet    Assistive device None  Gait Pattern Abducted - left;Abducted- right;Wide base of support;Decreased trunk rotation;Ataxic;Right genu recurvatum    Stairs Yes    Stairs Assistance 4: Min guard    Stair Management Technique One rail Left;Alternating pattern;Step to pattern                 Balance Exercises - 04/17/21 0001       Balance Exercises: Standing   SLS with Vectors Solid surface;Limitations    SLS with Vectors Limitations tapping 2 floor targets 10x each LE with CGA/MinA due to balance deficits, braced R knee to limit hyperextension                  PT Short Term Goals - 04/17/21 1407       PT SHORT TERM GOAL #1   Title Patient to demo I in initial HEP    Baseline TBD; 04/17/21 Instructed in walking program with new AFO    Time 4    Period Weeks    Status New    Target Date 05/09/21      PT SHORT TERM GOAL #2   Title Patient to perform 5x STS in 12 s or less    Baseline 15.9s    Time 4    Period Weeks    Status New    Target Date 05/09/21      PT SHORT TERM GOAL #3   Title Assess gait velocity and set goal    Baseline TBD    Time 4     Period Weeks    Status New    Target Date 05/09/21      PT SHORT TERM GOAL #4   Title Assess DGI and set Goal    Baseline TBD; 04/17/21 DGI score 18/24, goal is 20    Time 4    Period Weeks    Status Achieved    Target Date 05/09/21               PT Long Term Goals - 04/04/21 0830       PT LONG TERM GOAL #1   Title The patient will demonstrate initiation and completion of HEP with supervision from caregiver at home    Baseline TBD    Time 8    Period Weeks    Status New    Target Date 06/06/21      PT LONG TERM GOAL #2   Title Patient to regain 5d R DF AROM    Baseline 0d AROM R DF    Time 8    Period Weeks    Status New    Target Date 06/06/21      PT LONG TERM GOAL #3   Title The patient will ambulate 500' outdoors mod I over varying surfaces and surface heights including grass, curbs, and inclines to demonstrate improved functional mobility with community ambulation.    Baseline 176ft in clinic with CGA    Time 8    Period Weeks    Status New    Target Date 06/06/21      PT LONG TERM GOAL #4   Title Assess DGI goal    Baseline TBD    Time 8    Period Weeks    Status New    Target Date 06/06/21      PT LONG TERM GOAL #5   Title Decrease TUG score to 12s    Baseline 15.9s initially    Time 8    Period Weeks  Status New    Target Date 06/06/21      Additional Long Term Goals   Additional Long Term Goals Yes      PT LONG TERM GOAL #6   Title Increase R DF, Knee extension, hip flexion and abduction to 4/5    Baseline 3+/5 throughout    Time 8    Period Weeks    Status New    Target Date 06/06/21                   Plan - 04/17/21 1322     Clinical Impression Statement Patient arrives with R AFO and has been wearing it over several days.  He reports he does not like it but father states it has made him more stable at home.  Able to assess DGI with score of 18 noted.  Advanced to stepping tasks using floor targets requiring PT assist  to stabilize.  Able to negotiate 4 stairs with L rail with both step to and step through pattern under SBA    Stability/Clinical Decision Making Stable/Uncomplicated    Rehab Potential Good    PT Frequency 1x / week    PT Duration 6 weeks    PT Treatment/Interventions ADLs/Self Care Home Management;Aquatic Therapy;DME Instruction;Gait training;Stair training;Functional mobility training;Therapeutic activities;Therapeutic exercise;Balance training;Neuromuscular re-education;Patient/family education;Orthotic Fit/Training;Manual techniques    PT Next Visit Plan core strengthening and balance, stepping tasks, conpliant surface balance    PT Home Exercise Plan Ambulation with assist and R AFO             Patient will benefit from skilled therapeutic intervention in order to improve the following deficits and impairments:  Abnormal gait, Decreased endurance, Decreased activity tolerance, Decreased strength, Decreased balance, Decreased mobility, Difficulty walking, Increased muscle spasms, Decreased range of motion, Decreased coordination  Visit Diagnosis: Unsteadiness on feet  Muscle weakness (generalized)  Other abnormalities of gait and mobility     Problem List Patient Active Problem List   Diagnosis Date Noted   SIRS due to infectious process with acute organ dysfunction (HCC) 03/14/2021   SIRS (systemic inflammatory response syndrome) (HCC) 03/12/2021   Syncope 03/12/2021   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) 03/12/2021   Hypokalemia 03/12/2021   Lactic acidosis 03/12/2021   Mixed hyperlipidemia 04/25/2018   Seizure disorder (HCC) 04/11/2018   Mechanical low back pain 05/12/2017   Dysarthria 12/20/2015   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/20/2012   Deafness, sensorineural 04/20/2012   Buzzing in ear 04/20/2012   Leaking percutaneous endoscopic gastrostomy (PEG) tube (HCC) 02/04/2012   Diffuse traumatic brain injury with loss of consciousness greater than 24 hours  with return to pre-existing conscious levels, sequela (HCC) 10/12/2011   Cerebral infarct (HCC) 10/12/2011   Spastic hemiplegia of right dominant side due to noncerebrovascular etiology (HCC) 10/12/2011   Dysphagia 10/12/2011   Functioning G-tube 03/24/2011   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 [place], [place], [address] Phone: [phone]   Fax:  [phone]  Name: Paul Kane MRN: [id number] Date of Birth: Nov 24, 1985

## 2021-04-22 ENCOUNTER — Other Ambulatory Visit: Payer: Self-pay

## 2021-04-22 ENCOUNTER — Ambulatory Visit: Payer: BC Managed Care – PPO | Attending: Physical Medicine & Rehabilitation

## 2021-04-22 DIAGNOSIS — M6281 Muscle weakness (generalized): Secondary | ICD-10-CM | POA: Diagnosis present

## 2021-04-22 DIAGNOSIS — R2689 Other abnormalities of gait and mobility: Secondary | ICD-10-CM | POA: Diagnosis present

## 2021-04-22 DIAGNOSIS — G8929 Other chronic pain: Secondary | ICD-10-CM | POA: Diagnosis not present

## 2021-04-22 DIAGNOSIS — M25561 Pain in right knee: Secondary | ICD-10-CM | POA: Insufficient documentation

## 2021-04-22 DIAGNOSIS — R2681 Unsteadiness on feet: Secondary | ICD-10-CM | POA: Diagnosis not present

## 2021-04-22 DIAGNOSIS — R293 Abnormal posture: Secondary | ICD-10-CM | POA: Insufficient documentation

## 2021-04-22 DIAGNOSIS — R262 Difficulty in walking, not elsewhere classified: Secondary | ICD-10-CM | POA: Diagnosis present

## 2021-04-22 NOTE — Therapy (Signed)
[facility] [address] [place], [place], [address] Phone: [phone]   Fax:  [phone]  Physical Therapy Treatment  Patient Details  Name: Paul Kane MRN: [id number] Date of Birth: [birth year] Referring Provider (PT): Faith Rogue MD   Encounter Date: 04/22/2021   PT End of Session - 04/22/21 1634     Visit Number 6    Number of Visits 13    Date for PT Re-Evaluation 06/06/21    Authorization Type BCBS    Authorization Time Period 04/03/21-06/06/21    Progress Note Due on Visit 10    PT Start Time 1630    PT Stop Time 1700    PT Time Calculation (min) 30 min    Equipment Utilized During Treatment Gait belt    Activity Tolerance Patient tolerated treatment well    Behavior During Therapy WFL for tasks assessed/performed             Past Medical History:  Diagnosis Date   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HCC)    Chronic hepatitis B (HCC)    Fixed pupils    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HCC)    Hypertension    Intracranial injury of other and unspecified nature, without mention of open intracranial wound, unspecified state of consciousness    Intracranial shunt    Paralysis (HCC)    Spastic hemiplegia affecting dominant side (HCC)    Stroke (HCC)    Traumatic brain injury    Trouble swallowing    Visual disturbance    Weakness     Past Surgical History:  Procedure Laterality Date   cramiectomy     CRANIOPLASTY     REMOVAL OF GASTROSTOMY TUBE     VENTRICULOPERITONEAL SHUNT      There were no vitals filed for this visit.   Subjective Assessment - 04/22/21 1632     Subjective Has developed a skin irritation below fibular head, thought it may be coming from brace but brace rests well below region.  Skin Lovette Cliche may be due to R knee brace.    Patient is accompained by: Family member    Pertinent History Hx of CP    How long can you sit comfortably? unlimited    How long can you  stand comfortably? <10 min    How long can you walk comfortably? <5 min    Pain Onset Yesterday              Time spent inspecting lateral R foreleg due to skin abrasions thought to be from AFO, but when donned, brace dies not reach that high.  Irritation most likely from knee brace patient wears on occasion.  Continued wear of brace throughout treatment session with some discomfort reported over TPs in R peroneals.          [facility] Adult PT Treatment/Exercise - 04/22/21 0001       Transfers   Transfers Sit to Stand;Stand to Sit    Sit to Stand 5: Supervision      Ambulation/Gait   Ambulation/Gait Yes    Ambulation/Gait Assistance 4: Min guard    Ambulation Distance (Feet) 250 Feet    Assistive device None    Gait Pattern Abducted - left;Abducted- right;Wide base of support;Decreased trunk rotation;Ataxic;Right genu recurvatum                 Balance Exercises - 04/22/21 0001       Balance Exercises: Standing   Stepping Strategy Anterior;Lateral;10  reps;Limitations    Stepping Strategy Limitations performed stepping onto 2" block with LLE while PT braced R knee fro hyperextending                  PT Short Term Goals - 04/22/21 1653       PT SHORT TERM GOAL #1   Title Patient to demo I in initial HEP    Baseline TBD; 04/17/21 Instructed in walking program with new AFO    Time 4    Period Weeks    Status Achieved    Target Date 05/09/21      PT SHORT TERM GOAL #2   Title Patient to perform 5x STS in 12 s or less    Baseline 15.9s    Time 4    Period Weeks    Status New    Target Date 05/09/21      PT SHORT TERM GOAL #3   Title Assess gait velocity and set goal    Baseline TBD; 04/22/21 Gait velocity 0.49 m/s, goal is 0.6 m/s    Time 4    Period Weeks    Status Achieved    Target Date 05/09/21      PT SHORT TERM GOAL #4   Title Assess DGI and set Goal    Baseline TBD; 04/17/21 DGI score 18/24, goal is 20    Time 4    Period Weeks     Status Achieved    Target Date 05/09/21               PT Long Term Goals - 04/04/21 0830       PT LONG TERM GOAL #1   Title The patient will demonstrate initiation and completion of HEP with supervision from caregiver at home    Baseline TBD    Time 8    Period Weeks    Status New    Target Date 06/06/21      PT LONG TERM GOAL #2   Title Patient to regain 5d R DF AROM    Baseline 0d AROM R DF    Time 8    Period Weeks    Status New    Target Date 06/06/21      PT LONG TERM GOAL #3   Title The patient will ambulate 500' outdoors mod I over varying surfaces and surface heights including grass, curbs, and inclines to demonstrate improved functional mobility with community ambulation.    Baseline 164ft in clinic with CGA    Time 8    Period Weeks    Status New    Target Date 06/06/21      PT LONG TERM GOAL #4   Title Assess DGI goal    Baseline TBD    Time 8    Period Weeks    Status New    Target Date 06/06/21      PT LONG TERM GOAL #5   Title Decrease TUG score to 12s    Baseline 15.9s initially    Time 8    Period Weeks    Status New    Target Date 06/06/21      Additional Long Term Goals   Additional Long Term Goals Yes      PT LONG TERM GOAL #6   Title Increase R DF, Knee extension, hip flexion and abduction to 4/5    Baseline 3+/5 throughout    Time 8    Period Weeks    Status New  Target Date 06/06/21                    Patient will benefit from skilled therapeutic intervention in order to improve the following deficits and impairments:     Visit Diagnosis: Unsteadiness on feet  Muscle weakness (generalized)  Other abnormalities of gait and mobility     Problem List Patient Active Problem List   Diagnosis Date Noted   SIRS due to infectious process with acute organ dysfunction (HCC) 03/14/2021   SIRS (systemic inflammatory response syndrome) (HCC) 03/12/2021   Syncope 03/12/2021   Traumatic brain injury with  persistent deficit 03/12/2021   Hypokalemia 03/12/2021   Lactic acidosis 03/12/2021   Mixed hyperlipidemia 04/25/2018   Seizure disorder (HCC) 04/11/2018   Mechanical low back pain 05/12/2017   Dysarthria 12/20/2015   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/20/2012   Deafness, sensorineural 04/20/2012   Buzzing in ear 04/20/2012   Leaking percutaneous endoscopic gastrostomy (PEG) tube (HCC) 02/04/2012   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 pre-existing conscious levels, sequela (HCC) 10/12/2011   Cerebral infarct (HCC) 10/12/2011   Spastic hemiplegia of right dominant side due to noncerebrovascular etiology (HCC) 10/12/2011   Dysphagia 10/12/2011   Functioning G-tube 03/24/2011   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 [place], [place], [address] Phone: [phone]   Fax:  [phone]  Name: AYLEN RAMBERT MRN: [id number] Date of Birth: 1985/10/09

## 2021-04-24 ENCOUNTER — Encounter (HOSPITAL_BASED_OUTPATIENT_CLINIC_OR_DEPARTMENT_OTHER): Payer: Self-pay | Admitting: Physical Therapy

## 2021-04-24 ENCOUNTER — Ambulatory Visit (HOSPITAL_BASED_OUTPATIENT_CLINIC_OR_DEPARTMENT_OTHER): Payer: BC Managed Care – PPO | Attending: Physical Medicine & Rehabilitation | Admitting: Physical Therapy

## 2021-04-24 ENCOUNTER — Other Ambulatory Visit: Payer: Self-pay

## 2021-04-24 DIAGNOSIS — R293 Abnormal posture: Secondary | ICD-10-CM | POA: Diagnosis present

## 2021-04-24 DIAGNOSIS — R262 Difficulty in walking, not elsewhere classified: Secondary | ICD-10-CM | POA: Diagnosis present

## 2021-04-24 DIAGNOSIS — R2681 Unsteadiness on feet: Secondary | ICD-10-CM | POA: Diagnosis not present

## 2021-04-24 DIAGNOSIS — M25561 Pain in right knee: Secondary | ICD-10-CM | POA: Insufficient documentation

## 2021-04-24 DIAGNOSIS — M6281 Muscle weakness (generalized): Secondary | ICD-10-CM | POA: Insufficient documentation

## 2021-04-24 DIAGNOSIS — G8929 Other chronic pain: Secondary | ICD-10-CM | POA: Diagnosis present

## 2021-04-24 DIAGNOSIS — R2689 Other abnormalities of gait and mobility: Secondary | ICD-10-CM | POA: Diagnosis present

## 2021-04-24 NOTE — Therapy (Signed)
[facility] [address] [place], [place], [address] Phone: [phone]   Fax:  [phone]  Physical Therapy Treatment  Patient Details  Name: Paul Kane MRN: [id number] Date of Birth: 04/04/1986 Referring Provider (PT): Faith Rogue MD   Encounter Date: 04/24/2021   PT End of Session - 04/24/21 0908     Visit Number 7    Number of Visits 13    Date for PT Re-Evaluation 06/06/21    Authorization Type BCBS    Authorization Time Period 04/03/21-06/06/21    Progress Note Due on Visit 10    PT Start Time 0900    PT Stop Time 0955    PT Time Calculation (min) 55 min    Equipment Utilized During Treatment Gait belt    Activity Tolerance Patient tolerated treatment well    Behavior During Therapy WFL for tasks assessed/performed             Past Medical History:  Diagnosis Date   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HCC)    Chronic hepatitis B (HCC)    Fixed pupils    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HCC)    Hypertension    Intracranial injury of other and unspecified nature, without mention of open intracranial wound, unspecified state of consciousness    Intracranial shunt    Paralysis (HCC)    Spastic hemiplegia affecting dominant side (HCC)    Stroke (HCC)    Traumatic brain injury    Trouble swallowing    Visual disturbance    Weakness     Past Surgical History:  Procedure Laterality Date   cramiectomy     CRANIOPLASTY     REMOVAL OF GASTROSTOMY TUBE     VENTRICULOPERITONEAL SHUNT      There were no vitals filed for this visit.   Subjective Assessment - 04/24/21 0959     Subjective "Pt presents without knee brace today as father reports pt decided not to wear it today."                                          PT Short Term Goals - 04/22/21 1653       PT SHORT TERM GOAL #1   Title Patient to demo I in initial HEP    Baseline TBD; 04/17/21 Instructed in  walking program with new AFO    Time 4    Period Weeks    Status Achieved    Target Date 05/09/21      PT SHORT TERM GOAL #2   Title Patient to perform 5x STS in 12 s or less    Baseline 15.9s    Time 4    Period Weeks    Status New    Target Date 05/09/21      PT SHORT TERM GOAL #3   Title Assess gait velocity and set goal    Baseline TBD; 04/22/21 Gait velocity 0.49 m/s, goal is 0.6 m/s    Time 4    Period Weeks    Status Achieved    Target Date 05/09/21      PT SHORT TERM GOAL #4   Title Assess DGI and set Goal    Baseline TBD; 04/17/21 DGI score 18/24, goal is 20    Time 4    Period Weeks    Status Achieved    Target Date 05/09/21  PT Long Term Goals - 04/04/21 0830       PT LONG TERM GOAL #1   Title The patient will demonstrate initiation and completion of HEP with supervision from caregiver at home    Baseline TBD    Time 8    Period Weeks    Status New    Target Date 06/06/21      PT LONG TERM GOAL #2   Title Patient to regain 5d R DF AROM    Baseline 0d AROM R DF    Time 8    Period Weeks    Status New    Target Date 06/06/21      PT LONG TERM GOAL #3   Title The patient will ambulate 500' outdoors mod I over varying surfaces and surface heights including grass, curbs, and inclines to demonstrate improved functional mobility with community ambulation.    Baseline 16ft in clinic with CGA    Time 8    Period Weeks    Status New    Target Date 06/06/21      PT LONG TERM GOAL #4   Title Assess DGI goal    Baseline TBD    Time 8    Period Weeks    Status New    Target Date 06/06/21      PT LONG TERM GOAL #5   Title Decrease TUG score to 12s    Baseline 15.9s initially    Time 8    Period Weeks    Status New    Target Date 06/06/21      Additional Long Term Goals   Additional Long Term Goals Yes      PT LONG TERM GOAL #6   Title Increase R DF, Knee extension, hip flexion and abduction to 4/5    Baseline 3+/5 throughout     Time 8    Period Weeks    Status New    Target Date 06/06/21            Pt seen for aquatic therapy today.  Treatment took place in water 3.25-4.8 ft in depth at the [facility] pool. Temp of water was 91.  Pt entered/exited the pool via stairs step to pattern cga with bilat rail.   Warm up: water walking supported by yellow noodle LUE 4 widths each forward backward and side stepping. Therapist with intermitten assistance holding to noodle -VC for balance, posture and recovering from LOB Cross over stepping 1 width each R/L Cues for slowed pace, assist holding therapist holding noodle.    Noodle pushdowns pt using bilateral UE 3 x 10 reps decreasing BOS with each set.  Cues for tightened core to better improve balance.  Pt able to adjust well.  Increased difficulty with completion feet together.  Pt unable to complete > 2 reps without LOB backward. Step ups completed submerged to waist leading with each LE onto water step x 10 reps holding unilaterally to handrail. Backward step ups R/L x 6-7 -cues for eccentric and concentric control of right knee flex. Cues and manual assist throughout.    Gait training without ue support through all depths.  -4.6 ft jumping lle 3 x 20 reps.  Attempted with right although discomfort in knee.     Stair negotiation Using bilat handrails combination of step to and alternating pattern with cga. VC for control rle knee into extension.   Pt requires buoyancy for support and to offload joints with strengthening exercises. Viscosity of the water  is needed for resistance of strengthening; water current perturbations provides challenge to standing balance unsupported, requiring increased core activation.        Plan - 04/24/21 1000     Clinical Impression Statement Pt to have a wellness assessment at [facility] center as scheduled with membership.  Wellness director approached me to voice her concerns about safety with machines.  I spok e  with father who reports understanding pt will need constant assist and supervision with all equipment.  States was bringing pt to [facility] prior to covid.  Advanced pt balance challenges with increasing unsupported amb in pool all depths. He loses balance frequently toward right but is able to recorrect with minimal assistance. He has become more confident in environment and is able to participate in more difficulty challenges. Did c/o some right knee pain with activity.    Stability/Clinical Decision Making Stable/Uncomplicated    Rehab Potential Good    PT Frequency 1x / week    PT Duration 6 weeks    PT Treatment/Interventions ADLs/Self Care Home Management;Aquatic Therapy;DME Instruction;Gait training;Stair training;Functional mobility training;Therapeutic activities;Therapeutic exercise;Balance training;Neuromuscular re-education;Patient/family education;Orthotic Fit/Training;Manual techniques    PT Next Visit Plan balance and strengthening of core/RLE control    PT Home Exercise Plan Family with new membership will have access to therapy pool. Aquatic HEP to be assigned.             Patient will benefit from skilled therapeutic intervention in order to improve the following deficits and impairments:  Abnormal gait, Decreased endurance, Decreased activity tolerance, Decreased strength, Decreased balance, Decreased mobility, Difficulty walking, Increased muscle spasms, Decreased range of motion, Decreased coordination  Visit Diagnosis: Unsteadiness on feet  Muscle weakness (generalized)  Other abnormalities of gait and mobility     Problem List Patient Active Problem List   Diagnosis Date Noted   SIRS due to infectious process with acute organ dysfunction (HCC) 03/14/2021   SIRS (systemic inflammatory response syndrome) (HCC) 03/12/2021   Syncope 03/12/2021   Traumatic brain injury with persistent deficit 03/12/2021   Hypokalemia 03/12/2021   Lactic acidosis 03/12/2021   Mixed  hyperlipidemia 04/25/2018   Seizure disorder (HCC) 04/11/2018   Mechanical low back pain 05/12/2017   Dysarthria 12/20/2015   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/20/2012   Deafness, sensorineural 04/20/2012   Buzzing in ear 04/20/2012   Leaking percutaneous endoscopic gastrostomy (PEG) tube (HCC) 02/04/2012   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 pre-existing conscious levels, sequela (HCC) 10/12/2011   Cerebral infarct (HCC) 10/12/2011   Spastic hemiplegia of right dominant side due to noncerebrovascular etiology (HCC) 10/12/2011   Dysphagia 10/12/2011   Functioning G-tube 03/24/2011   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   Hemiplegia of dominant side (HCC) 07/28/2010   CEREBRAL HEMORRHAGE 07/28/2010   PERSONAL HISTORY OF TRAUMATIC BRAIN INJURY 07/02/2010   ALLERGIC RHINITIS CAUSE UNSPECIFIED 10/25/2008   GASTROENTERITIS 08/09/2008   CONTACT DERMATITIS&OTHER ECZEMA DUE DETERGENTS 08/18/2007   HEPATITIS B, CHRONIC 06/28/2007   Chronic viral hepatitis B without delta-agent (HCC) 06/28/2007    Rushie Chestnut) Jahlon Baines MPT 04/24/2021, 11:09 AM  [facility] [address] [place], [place], [address] Phone: [phone]   Fax:  [phone]  Name: Paul Kane MRN: [id number] Date of Birth: Nov 02, 1985

## 2021-04-28 ENCOUNTER — Encounter (HOSPITAL_BASED_OUTPATIENT_CLINIC_OR_DEPARTMENT_OTHER): Payer: Self-pay | Admitting: Physical Therapy

## 2021-04-29 ENCOUNTER — Other Ambulatory Visit: Payer: Self-pay

## 2021-04-29 ENCOUNTER — Ambulatory Visit: Payer: BC Managed Care – PPO

## 2021-04-29 DIAGNOSIS — R2689 Other abnormalities of gait and mobility: Secondary | ICD-10-CM

## 2021-04-29 DIAGNOSIS — R2681 Unsteadiness on feet: Secondary | ICD-10-CM | POA: Diagnosis not present

## 2021-04-29 DIAGNOSIS — M6281 Muscle weakness (generalized): Secondary | ICD-10-CM

## 2021-04-29 NOTE — Therapy (Signed)
[facility] [address] [place], [place], [address] Phone: [phone]   Fax:  [phone]  Physical Therapy Treatment  Patient Details  Name: Paul Kane MRN: [id number] Date of Birth: 11/13/1985 Referring Provider (PT): Faith Rogue MD   Encounter Date: 04/29/2021   PT End of Session - 04/29/21 1700     Visit Number 8    Number of Visits 13    Date for PT Re-Evaluation 06/06/21    Authorization Type BCBS    Authorization Time Period 04/03/21-06/06/21    Progress Note Due on Visit 10    PT Start Time 1615    PT Stop Time 1700    PT Time Calculation (min) 45 min    Equipment Utilized During Treatment Gait belt    Activity Tolerance Patient tolerated treatment well    Behavior During Therapy WFL for tasks assessed/performed             Past Medical History:  Diagnosis Date   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HCC)    Chronic hepatitis B (HCC)    Fixed pupils    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HCC)    Hypertension    Intracranial injury of other and unspecified nature, without mention of open intracranial wound, unspecified state of consciousness    Intracranial shunt    Paralysis (HCC)    Spastic hemiplegia affecting dominant side (HCC)    Stroke (HCC)    Traumatic brain injury    Trouble swallowing    Visual disturbance    Weakness     Past Surgical History:  Procedure Laterality Date   cramiectomy     CRANIOPLASTY     REMOVAL OF GASTROSTOMY TUBE     VENTRICULOPERITONEAL SHUNT      There were no vitals filed for this visit.   Subjective Assessment - 04/29/21 1800     Subjective Does not like AFO and will have a separate ankle brace made to correct supination positioning tendency, no falls to report    Patient is accompained by: Family member    Pertinent History Hx of CP    How long can you sit comfortably? unlimited    How long can you stand comfortably? <10 min    How long  can you walk comfortably? <5 min    Pain Onset Yesterday                               [facility] Adult PT Treatment/Exercise - 04/29/21 0001       Transfers   Transfers Sit to Stand;Stand to Sit    Sit to Stand 5: Supervision      Ambulation/Gait   Ambulation/Gait Yes    Ambulation/Gait Assistance 4: Min guard    Ambulation Distance (Feet) 100 Feet    Assistive device None    Gait Pattern Abducted - left;Abducted- right;Wide base of support;Decreased trunk rotation;Ataxic;Right genu recurvatum      Manual Therapy   Manual Therapy Joint mobilization;Passive ROM;Other (comment)    Joint Mobilization mobilizations to increase DF and eversion, static stretch into PF, light manual resistance to DF and eversion, 2x15 each direction                 Balance Exercises - 04/29/21 0001       Balance Exercises: Standing   SLS Eyes open;30 secs;Limitations    SLS Limitations 30s stand on RLE with LUE support  Step Ups Forward;Lateral;6 inch;UE support 1;Limitations    Step Ups Limitations 15x with RLE single UE support    Sidestepping 5 reps;Upper extremity support;Limitations    Sidestepping Limitations 5 trips at counter    Heel Raises Both;15 reps;Limitations    Heel Raises Limitations UE support    Toe Raise 15 reps;Limitations    Toe Raise Limitations UE support                  PT Short Term Goals - 04/22/21 1653       PT SHORT TERM GOAL #1   Title Patient to demo I in initial HEP    Baseline TBD; 04/17/21 Instructed in walking program with new AFO    Time 4    Period Weeks    Status Achieved    Target Date 05/09/21      PT SHORT TERM GOAL #2   Title Patient to perform 5x STS in 12 s or less    Baseline 15.9s    Time 4    Period Weeks    Status New    Target Date 05/09/21      PT SHORT TERM GOAL #3   Title Assess gait velocity and set goal    Baseline TBD; 04/22/21 Gait velocity 0.49 m/s, goal is 0.6 m/s    Time 4    Period  Weeks    Status Achieved    Target Date 05/09/21      PT SHORT TERM GOAL #4   Title Assess DGI and set Goal    Baseline TBD; 04/17/21 DGI score 18/24, goal is 20    Time 4    Period Weeks    Status Achieved    Target Date 05/09/21               PT Long Term Goals - 04/04/21 0830       PT LONG TERM GOAL #1   Title The patient will demonstrate initiation and completion of HEP with supervision from caregiver at home    Baseline TBD    Time 8    Period Weeks    Status New    Target Date 06/06/21      PT LONG TERM GOAL #2   Title Patient to regain 5d R DF AROM    Baseline 0d AROM R DF    Time 8    Period Weeks    Status New    Target Date 06/06/21      PT LONG TERM GOAL #3   Title The patient will ambulate 500' outdoors mod I over varying surfaces and surface heights including grass, curbs, and inclines to demonstrate improved functional mobility with community ambulation.    Baseline 151ft in clinic with CGA    Time 8    Period Weeks    Status New    Target Date 06/06/21      PT LONG TERM GOAL #4   Title Assess DGI goal    Baseline TBD    Time 8    Period Weeks    Status New    Target Date 06/06/21      PT LONG TERM GOAL #5   Title Decrease TUG score to 12s    Baseline 15.9s initially    Time 8    Period Weeks    Status New    Target Date 06/06/21      Additional Long Term Goals   Additional Long Term Goals Yes  PT LONG TERM GOAL #6   Title Increase R DF, Knee extension, hip flexion and abduction to 4/5    Baseline 3+/5 throughout    Time 8    Period Weeks    Status New    Target Date 06/06/21                   Plan - 04/29/21 1803     Clinical Impression Statement Todays session focused and ankle strength, mobility and function, adding manual techniques to improve DF abd eversion, 7d PROM DF noted.  Added stepping tasks and established a HEP based on closed chain  ankle strengthening and propriocreptive challenges.  Patient not  wearing AFO just R knee brace to control hyperextension.    Personal Factors and Comorbidities Comorbidity 1    Comorbidities CP    Examination-Activity Limitations Locomotion Level    Stability/Clinical Decision Making Stable/Uncomplicated    Rehab Potential Good    PT Frequency 2x / week    PT Duration 6 weeks    PT Treatment/Interventions ADLs/Self Care Home Management;Aquatic Therapy;DME Instruction;Gait training;Stair training;Functional mobility training;Therapeutic activities;Therapeutic exercise;Balance training;Neuromuscular re-education;Patient/family education;Orthotic Fit/Training;Manual techniques    PT Next Visit Plan f/u with HEP and ankle mobility, check R adductor mobility    PT Home Exercise Plan [email or web address]             Patient will benefit from skilled therapeutic intervention in order to improve the following deficits and impairments:  Abnormal gait, Decreased endurance, Decreased activity tolerance, Decreased strength, Decreased balance, Decreased mobility, Difficulty walking, Increased muscle spasms, Decreased range of motion, Decreased coordination  Visit Diagnosis: Unsteadiness on feet  Muscle weakness (generalized)  Other abnormalities of gait and mobility     Problem List Patient Active Problem List   Diagnosis Date Noted   SIRS due to infectious process with acute organ dysfunction (HCC) 03/14/2021   SIRS (systemic inflammatory response syndrome) (HCC) 03/12/2021   Syncope 03/12/2021   Traumatic brain injury with persistent deficit 03/12/2021   Hypokalemia 03/12/2021   Lactic acidosis 03/12/2021   Mixed hyperlipidemia 04/25/2018   Seizure disorder (HCC) 04/11/2018   Mechanical low back pain 05/12/2017   Dysarthria 12/20/2015   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/20/2012   Deafness, sensorineural 04/20/2012   Buzzing in ear 04/20/2012   Leaking percutaneous endoscopic gastrostomy (PEG) tube (HCC) 02/04/2012   Diffuse traumatic brain injury with loss of  consciousness greater than 24 hours with return to pre-existing conscious levels, sequela (HCC) 10/12/2011   Cerebral infarct (HCC) 10/12/2011   Spastic hemiplegia of right dominant side due to noncerebrovascular etiology (HCC) 10/12/2011   Dysphagia 10/12/2011   Functioning G-tube 03/24/2011   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   Hemiplegia of dominant side (HCC) 07/28/2010   CEREBRAL HEMORRHAGE 07/28/2010   PERSONAL HISTORY OF TRAUMATIC BRAIN INJURY 07/02/2010   ALLERGIC RHINITIS CAUSE UNSPECIFIED 10/25/2008   GASTROENTERITIS 08/09/2008   CONTACT DERMATITIS&OTHER ECZEMA DUE DETERGENTS 08/18/2007   HEPATITIS B, CHRONIC 06/28/2007   Chronic viral hepatitis B without delta-agent (HCC) 06/28/2007    Hildred Laser, PT 04/29/2021, 6:12 PM  [facility] [address] [place], [place], [address] Phone: [phone]   Fax:  [phone]  Name: Paul Kane MRN: [id number] Date of Birth: [birth year]

## 2021-04-29 NOTE — Patient Instructions (Signed)
Access Code: [email or web address] URL: [email or web address] Date: 04/29/2021 Prepared by: Gustavus Bryant  Exercises Single Leg Stance with Support - 2 x daily - 7 x weekly - 3 sets - 1 reps - 30s hold Heel Raises with Counter Support - 2 x daily - 7 x weekly - 2 sets - 15 reps Heel Toe Raises with Counter Support - 2 x daily - 7 x weekly - 2 sets - 15 reps Side Stepping with Counter Support - 2 x daily - 7 x weekly - 2 sets - 10 reps Lateral Step Up with Counter Support - 2 x daily - 7 x weekly - 2 sets - 15 reps Forward Step Up with Counter Support - 2 x daily - 7 x weekly - 2 sets - 15 reps

## 2021-04-30 ENCOUNTER — Encounter (HOSPITAL_BASED_OUTPATIENT_CLINIC_OR_DEPARTMENT_OTHER): Payer: Self-pay | Admitting: Physical Therapy

## 2021-04-30 ENCOUNTER — Ambulatory Visit (HOSPITAL_BASED_OUTPATIENT_CLINIC_OR_DEPARTMENT_OTHER): Payer: BC Managed Care – PPO | Admitting: Physical Therapy

## 2021-04-30 DIAGNOSIS — G8929 Other chronic pain: Secondary | ICD-10-CM

## 2021-04-30 DIAGNOSIS — R293 Abnormal posture: Secondary | ICD-10-CM

## 2021-04-30 DIAGNOSIS — R262 Difficulty in walking, not elsewhere classified: Secondary | ICD-10-CM

## 2021-04-30 DIAGNOSIS — R2689 Other abnormalities of gait and mobility: Secondary | ICD-10-CM

## 2021-04-30 DIAGNOSIS — M6281 Muscle weakness (generalized): Secondary | ICD-10-CM

## 2021-04-30 DIAGNOSIS — R2681 Unsteadiness on feet: Secondary | ICD-10-CM | POA: Diagnosis not present

## 2021-04-30 NOTE — Therapy (Signed)
[facility] [address] [place], [place], [address] Phone: [phone]   Fax:  [phone]  Physical Therapy Treatment  Patient Details  Name: Paul Kane MRN: [id number] Date of Birth: Oct 28, 1985 Referring Provider (PT): Faith Rogue MD   Encounter Date: 04/30/2021   PT End of Session - 04/30/21 0956     Visit Number 9    Number of Visits 13    Date for PT Re-Evaluation 06/06/21    Authorization Type BCBS    Authorization Time Period 04/03/21-06/06/21    Progress Note Due on Visit 10    PT Start Time 0956    PT Stop Time 1034    PT Time Calculation (min) 38 min    Equipment Utilized During Treatment Gait belt    Activity Tolerance Patient tolerated treatment well    Behavior During Therapy WFL for tasks assessed/performed             Past Medical History:  Diagnosis Date   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HCC)    Chronic hepatitis B (HCC)    Fixed pupils    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HCC)    Hypertension    Intracranial injury of other and unspecified nature, without mention of open intracranial wound, unspecified state of consciousness    Intracranial shunt    Paralysis (HCC)    Spastic hemiplegia affecting dominant side (HCC)    Stroke (HCC)    Traumatic brain injury    Trouble swallowing    Visual disturbance    Weakness     Past Surgical History:  Procedure Laterality Date   cramiectomy     CRANIOPLASTY     REMOVAL OF GASTROSTOMY TUBE     VENTRICULOPERITONEAL SHUNT      There were no vitals filed for this visit.   Subjective Assessment - 04/30/21 1302     Subjective No concerns.    Currently in Pain? No/denies                                          PT Short Term Goals - 04/22/21 1653       PT SHORT TERM GOAL #1   Title Patient to demo I in initial HEP    Baseline TBD; 04/17/21 Instructed in walking program with new AFO    Time 4     Period Weeks    Status Achieved    Target Date 05/09/21      PT SHORT TERM GOAL #2   Title Patient to perform 5x STS in 12 s or less    Baseline 15.9s    Time 4    Period Weeks    Status New    Target Date 05/09/21      PT SHORT TERM GOAL #3   Title Assess gait velocity and set goal    Baseline TBD; 04/22/21 Gait velocity 0.49 m/s, goal is 0.6 m/s    Time 4    Period Weeks    Status Achieved    Target Date 05/09/21      PT SHORT TERM GOAL #4   Title Assess DGI and set Goal    Baseline TBD; 04/17/21 DGI score 18/24, goal is 20    Time 4    Period Weeks    Status Achieved    Target Date 05/09/21  PT Long Term Goals - 04/04/21 0830       PT LONG TERM GOAL #1   Title The patient will demonstrate initiation and completion of HEP with supervision from caregiver at home    Baseline TBD    Time 8    Period Weeks    Status New    Target Date 06/06/21      PT LONG TERM GOAL #2   Title Patient to regain 5d R DF AROM    Baseline 0d AROM R DF    Time 8    Period Weeks    Status New    Target Date 06/06/21      PT LONG TERM GOAL #3   Title The patient will ambulate 500' outdoors mod I over varying surfaces and surface heights including grass, curbs, and inclines to demonstrate improved functional mobility with community ambulation.    Baseline 164ft in clinic with CGA    Time 8    Period Weeks    Status New    Target Date 06/06/21      PT LONG TERM GOAL #4   Title Assess DGI goal    Baseline TBD    Time 8    Period Weeks    Status New    Target Date 06/06/21      PT LONG TERM GOAL #5   Title Decrease TUG score to 12s    Baseline 15.9s initially    Time 8    Period Weeks    Status New    Target Date 06/06/21      Additional Long Term Goals   Additional Long Term Goals Yes      PT LONG TERM GOAL #6   Title Increase R DF, Knee extension, hip flexion and abduction to 4/5    Baseline 3+/5 throughout    Time 8    Period Weeks    Status New     Target Date 06/06/21            Pt seen for aquatic therapy today.  Treatment took place in water 3.25-4.8 ft in depth at the [facility] pool. Temp of water was 91.  Pt entered/exited the pool via stairs step to pattern cga with bilat rail.   Warm up: water walking unsupported 4 widths and 2 lengths forward backward and side stepping. Pt with multiple slight LOB but recovers mostly indep Gait training instructions  with warm up -VC for balance, posture and recovering from LOB    Step ups completed submerged to waist leading with each LE onto water step x 10 reps holding unilaterally to handrail. Backward step ups R/L x 8 Side stepping over water step  x 10 -cues for eccentric and concentric control of right knee flex as well as body awareness and placement of feet.Cues and manual assist throughout.  Pushing water step forward using lle x 10 ft.   -4.6 ft jumping lle 3 x 20 reps.    Supine suspension supported by squoodle, water belt and therapist: knees to chest 2 x 10 reps lower abd/core strengthening, add/abd and hip /knee flex ext x 8 mins continuous     Pt requires buoyancy for support and to offload joints with strengthening exercises. Viscosity of the water is needed for resistance of strengthening; water current perturbations provides challenge to standing balance unsupported, requiring increased core activation.        Plan - 04/30/21 1304     Clinical Impression Statement Progressed stepping tasks  and proprioceptive challenges in water. Pt not using ue support with amb submerged.  Able to decrease UE support with step ups. Improved placement of RLE with cues on and over water step.  Aerobic capacity challenge added with minimal success but able to keep pt in motion kicking, add/abd and core strengthening for 8 minutes (in supine suspension)    Stability/Clinical Decision Making Stable/Uncomplicated    Rehab Potential Good    PT Frequency 2x / week    PT  Duration 6 weeks    PT Treatment/Interventions ADLs/Self Care Home Management;Aquatic Therapy;DME Instruction;Gait training;Stair training;Functional mobility training;Therapeutic activities;Therapeutic exercise;Balance training;Neuromuscular re-education;Patient/family education;Orthotic Fit/Training;Manual techniques    PT Next Visit Plan progress proprioception/stepping    PT Home Exercise Plan [id number]             Patient will benefit from skilled therapeutic intervention in order to improve the following deficits and impairments:  Abnormal gait, Decreased endurance, Decreased activity tolerance, Decreased strength, Decreased balance, Decreased mobility, Difficulty walking, Increased muscle spasms, Decreased range of motion, Decreased coordination  Visit Diagnosis: Abnormal posture  Chronic pain of right knee  Difficulty in walking, not elsewhere classified  Muscle weakness (generalized)  Other abnormalities of gait and mobility  Unsteadiness on feet     Problem List Patient Active Problem List   Diagnosis Date Noted   SIRS due to infectious process with acute organ dysfunction (HCC) 03/14/2021   SIRS (systemic inflammatory response syndrome) (HCC) 03/12/2021   Syncope 03/12/2021   Traumatic brain injury with persistent deficit 03/12/2021   Hypokalemia 03/12/2021   Lactic acidosis 03/12/2021   Mixed hyperlipidemia 04/25/2018   Seizure disorder (HCC) 04/11/2018   Mechanical low back pain 05/12/2017   Dysarthria 12/20/2015   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/20/2012   Deafness, sensorineural 04/20/2012   Buzzing in ear 04/20/2012   Leaking percutaneous endoscopic gastrostomy (PEG) tube (HCC) 02/04/2012   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 pre-existing conscious levels, sequela (HCC) 10/12/2011   Cerebral infarct (HCC) 10/12/2011   Spastic hemiplegia of right dominant side due to noncerebrovascular etiology (HCC)  10/12/2011   Dysphagia 10/12/2011   Functioning G-tube 03/24/2011   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 [place], [place], [address] Phone: [phone]   Fax:  [phone]  Name: CHRISTOP HIPPERT MRN: [id number] Date of Birth: 1986-01-11

## 2021-05-06 ENCOUNTER — Ambulatory Visit (HOSPITAL_BASED_OUTPATIENT_CLINIC_OR_DEPARTMENT_OTHER): Payer: BC Managed Care – PPO | Admitting: Physical Therapy

## 2021-05-06 ENCOUNTER — Other Ambulatory Visit: Payer: Self-pay

## 2021-05-06 DIAGNOSIS — M6281 Muscle weakness (generalized): Secondary | ICD-10-CM

## 2021-05-06 DIAGNOSIS — R2689 Other abnormalities of gait and mobility: Secondary | ICD-10-CM

## 2021-05-06 DIAGNOSIS — R293 Abnormal posture: Secondary | ICD-10-CM

## 2021-05-06 DIAGNOSIS — R262 Difficulty in walking, not elsewhere classified: Secondary | ICD-10-CM

## 2021-05-06 DIAGNOSIS — G8929 Other chronic pain: Secondary | ICD-10-CM

## 2021-05-06 DIAGNOSIS — R2681 Unsteadiness on feet: Secondary | ICD-10-CM | POA: Diagnosis not present

## 2021-05-06 NOTE — Therapy (Deleted)
[facility] [address] [place], [place], [address] Phone: [phone]   Fax:  [phone]  Physical Therapy Treatment  Patient Details  Name: Paul Kane MRN: [id number] Date of Birth: 1985-08-09 Referring Provider (PT): Faith Rogue MD   Encounter Date: 05/06/2021   PT End of Session - 05/06/21 1035     Visit Number 10    Number of Visits 13    Date for PT Re-Evaluation 06/06/21    Authorization Type BCBS    Authorization Time Period 04/03/21-06/06/21    Progress Note Due on Visit 10    PT Start Time 0950    Equipment Utilized During Treatment Gait belt    Activity Tolerance Patient tolerated treatment well    Behavior During Therapy [facility] for tasks assessed/performed             Past Medical History:  Diagnosis Date   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HCC)    Chronic hepatitis B (HCC)    Fixed pupils    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HCC)    Hypertension    Intracranial injury of other and unspecified nature, without mention of open intracranial wound, unspecified state of consciousness    Intracranial shunt    Paralysis (HCC)    Spastic hemiplegia affecting dominant side (HCC)    Stroke (HCC)    Traumatic brain injury    Trouble swallowing    Visual disturbance    Weakness     Past Surgical History:  Procedure Laterality Date   cramiectomy     CRANIOPLASTY     REMOVAL OF GASTROSTOMY TUBE     VENTRICULOPERITONEAL SHUNT      There were no vitals filed for this visit.                                 PT Short Term Goals - 04/22/21 1653       PT SHORT TERM GOAL #1   Title Patient to demo I in initial HEP    Baseline TBD; 04/17/21 Instructed in walking program with new AFO    Time 4    Period Weeks    Status Achieved    Target Date 05/09/21      PT SHORT TERM GOAL #2   Title Patient to perform 5x STS in 12 s or less    Baseline 15.9s    Time 4    Period  Weeks    Status New    Target Date 05/09/21      PT SHORT TERM GOAL #3   Title Assess gait velocity and set goal    Baseline TBD; 04/22/21 Gait velocity 0.49 m/s, goal is 0.6 m/s    Time 4    Period Weeks    Status Achieved    Target Date 05/09/21      PT SHORT TERM GOAL #4   Title Assess DGI and set Goal    Baseline TBD; 04/17/21 DGI score 18/24, goal is 20    Time 4    Period Weeks    Status Achieved    Target Date 05/09/21               PT Long Term Goals - 04/04/21 0830       PT LONG TERM GOAL #1   Title The patient will demonstrate initiation and completion of HEP with supervision from caregiver at home    Baseline  TBD    Time 8    Period Weeks    Status New    Target Date 06/06/21      PT LONG TERM GOAL #2   Title Patient to regain 5d R DF AROM    Baseline 0d AROM R DF    Time 8    Period Weeks    Status New    Target Date 06/06/21      PT LONG TERM GOAL #3   Title The patient will ambulate 500' outdoors mod I over varying surfaces and surface heights including grass, curbs, and inclines to demonstrate improved functional mobility with community ambulation.    Baseline 157ft in clinic with CGA    Time 8    Period Weeks    Status New    Target Date 06/06/21      PT LONG TERM GOAL #4   Title Assess DGI goal    Baseline TBD    Time 8    Period Weeks    Status New    Target Date 06/06/21      PT LONG TERM GOAL #5   Title Decrease TUG score to 12s    Baseline 15.9s initially    Time 8    Period Weeks    Status New    Target Date 06/06/21      Additional Long Term Goals   Additional Long Term Goals Yes      PT LONG TERM GOAL #6   Title Increase R DF, Knee extension, hip flexion and abduction to 4/5    Baseline 3+/5 throughout    Time 8    Period Weeks    Status New    Target Date 06/06/21                    Patient will benefit from skilled therapeutic intervention in order to improve the following deficits and impairments:      Visit Diagnosis: Abnormal posture  Chronic pain of right knee  Difficulty in walking, not elsewhere classified  Muscle weakness (generalized)  Unsteadiness on feet  Other abnormalities of gait and mobility     Problem List Patient Active Problem List   Diagnosis Date Noted   SIRS due to infectious process with acute organ dysfunction (HCC) 03/14/2021   SIRS (systemic inflammatory response syndrome) (HCC) 03/12/2021   Syncope 03/12/2021   Traumatic brain injury with persistent deficit 03/12/2021   Hypokalemia 03/12/2021   Lactic acidosis 03/12/2021   Mixed hyperlipidemia 04/25/2018   Seizure disorder (HCC) 04/11/2018   Mechanical low back pain 05/12/2017   Dysarthria 12/20/2015   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/20/2012   Deafness, sensorineural 04/20/2012   Buzzing in ear 04/20/2012   Leaking percutaneous endoscopic gastrostomy (PEG) tube (HCC) 02/04/2012   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 pre-existing conscious levels, sequela (HCC) 10/12/2011   Cerebral infarct (HCC) 10/12/2011   Spastic hemiplegia of right dominant side due to noncerebrovascular etiology (HCC) 10/12/2011   Dysphagia 10/12/2011   Functioning G-tube 03/24/2011   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   Hemiplegia of dominant side (HCC) 07/28/2010   CEREBRAL HEMORRHAGE 07/28/2010   PERSONAL HISTORY OF TRAUMATIC BRAIN INJURY 07/02/2010   ALLERGIC RHINITIS CAUSE UNSPECIFIED 10/25/2008   GASTROENTERITIS 08/09/2008   CONTACT DERMATITIS&OTHER ECZEMA DUE DETERGENTS 08/18/2007   HEPATITIS B, CHRONIC 06/28/2007   Chronic viral hepatitis B without delta-agent (HCC) 06/28/2007    Jeanmarie Hubert, PT 05/06/2021, 10:36 AM  [facility] [address] [place], [place], [address] Phone: [phone]   Fax:  [phone]  Name: WEBB WEED MRN: [id number] Date of Birth: 1986/04/05

## 2021-05-06 NOTE — Therapy (Signed)
[facility] [address] [place], [place], [address] Phone: [phone]   Fax:  [phone]  Physical Therapy Treatment  Patient Details  Name: Paul Kane MRN: [id number] Date of Birth: May 11, 1986 Referring Provider (PT): Faith Rogue MD   Encounter Date: 05/06/2021   PT End of Session - 05/06/21 1035     Visit Number 10    Number of Visits 13    Date for PT Re-Evaluation 06/06/21    Authorization Type BCBS    Authorization Time Period 04/03/21-06/06/21    Progress Note Due on Visit 10    PT Start Time 0950    PT Stop Time 1030    PT Time Calculation (min) 40 min    Equipment Utilized During Treatment Gait belt    Activity Tolerance Patient tolerated treatment well    Behavior During Therapy WFL for tasks assessed/performed             Past Medical History:  Diagnosis Date   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HCC)    Chronic hepatitis B (HCC)    Fixed pupils    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HCC)    Hypertension    Intracranial injury of other and unspecified nature, without mention of open intracranial wound, unspecified state of consciousness    Intracranial shunt    Paralysis (HCC)    Spastic hemiplegia affecting dominant side (HCC)    Stroke (HCC)    Traumatic brain injury    Trouble swallowing    Visual disturbance    Weakness     Past Surgical History:  Procedure Laterality Date   cramiectomy     CRANIOPLASTY     REMOVAL OF GASTROSTOMY TUBE     VENTRICULOPERITONEAL SHUNT      There were no vitals filed for this visit.   Subjective Assessment - 05/06/21 1254     Subjective Father reports he can tell Paul Kane is getting stronger.                                          PT Short Term Goals - 04/22/21 1653       PT SHORT TERM GOAL #1   Title Patient to demo I in initial HEP    Baseline TBD; 04/17/21 Instructed in walking program with new AFO     Time 4    Period Weeks    Status Achieved    Target Date 05/09/21      PT SHORT TERM GOAL #2   Title Patient to perform 5x STS in 12 s or less    Baseline 15.9s    Time 4    Period Weeks    Status New    Target Date 05/09/21      PT SHORT TERM GOAL #3   Title Assess gait velocity and set goal    Baseline TBD; 04/22/21 Gait velocity 0.49 m/s, goal is 0.6 m/s    Time 4    Period Weeks    Status Achieved    Target Date 05/09/21      PT SHORT TERM GOAL #4   Title Assess DGI and set Goal    Baseline TBD; 04/17/21 DGI score 18/24, goal is 20    Time 4    Period Weeks    Status Achieved    Target Date 05/09/21  PT Long Term Goals - 04/04/21 0830       PT LONG TERM GOAL #1   Title The patient will demonstrate initiation and completion of HEP with supervision from caregiver at home    Baseline TBD    Time 8    Period Weeks    Status New    Target Date 06/06/21      PT LONG TERM GOAL #2   Title Patient to regain 5d R DF AROM    Baseline 0d AROM R DF    Time 8    Period Weeks    Status New    Target Date 06/06/21      PT LONG TERM GOAL #3   Title The patient will ambulate 500' outdoors mod I over varying surfaces and surface heights including grass, curbs, and inclines to demonstrate improved functional mobility with community ambulation.    Baseline 128ft in clinic with CGA    Time 8    Period Weeks    Status New    Target Date 06/06/21      PT LONG TERM GOAL #4   Title Assess DGI goal    Baseline TBD    Time 8    Period Weeks    Status New    Target Date 06/06/21      PT LONG TERM GOAL #5   Title Decrease TUG score to 12s    Baseline 15.9s initially    Time 8    Period Weeks    Status New    Target Date 06/06/21      Additional Long Term Goals   Additional Long Term Goals Yes      PT LONG TERM GOAL #6   Title Increase R DF, Knee extension, hip flexion and abduction to 4/5    Baseline 3+/5 throughout    Time 8    Period Weeks     Status New    Target Date 06/06/21           Pt seen for aquatic therapy today.  Treatment took place in water 3.25-4.8 ft in depth at the [facility] pool. Temp of water was 91.  Pt entered/exited the pool via stairs step to pattern cga with bilat rail.   Warm up: water walking unsupported 4 widths and 2 lengths forward backward and side stepping.  -VC for balance, posture and recovering from LOB     Step ups completed submerged to chest, leading with each LE onto water step x 10 reps holding unilaterally to wall.  Progressed to step ups without ue support. VC for body awareness, placement and control of LLE. Multiple LOB which pt recovers from with minimal assist. (To improve core strength and balance)  Side stepping over water step  x 10 decreasing to No ue support.  VC as above     Supine suspension supported by squoodle, water belt and therapist: knees to chest 2 x 10 reps lower abd/core strengthening, knees chest with R/L rotation x 10 -kicking x 5 mins - modified squats/Pushing off wall bilateral then R and L multiple trials/reps.   Pt requires buoyancy for support and to offload joints with strengthening exercises. Viscosity of the water is needed for resistance of strengthening; water current perturbations provides challenge to standing balance unsupported, requiring increased core activation.         Plan - 05/06/21 1303     Clinical Impression Statement Pt with improved body awareness and control submerged as evidence of improved  balance and strength.  He amb with out support and has progressed to step ups and step over (step) challneging to complete without ue support.  He is successful 25% of time weight shifting properly. He is regaining his upright position with little to no assistance.  Added modified squats for strengthening today in supine which pt enjoyed.    Stability/Clinical Decision Making Stable/Uncomplicated    Rehab Potential Good    PT Frequency  2x / week    PT Duration 6 weeks    PT Treatment/Interventions ADLs/Self Care Home Management;Aquatic Therapy;DME Instruction;Gait training;Stair training;Functional mobility training;Therapeutic activities;Therapeutic exercise;Balance training;Neuromuscular re-education;Patient/family education;Orthotic Fit/Training;Manual techniques             Patient will benefit from skilled therapeutic intervention in order to improve the following deficits and impairments:  Abnormal gait, Decreased endurance, Decreased activity tolerance, Decreased strength, Decreased balance, Decreased mobility, Difficulty walking, Increased muscle spasms, Decreased range of motion, Decreased coordination  Visit Diagnosis: Abnormal posture  Chronic pain of right knee  Difficulty in walking, not elsewhere classified  Muscle weakness (generalized)  Unsteadiness on feet  Other abnormalities of gait and mobility     Problem List Patient Active Problem List   Diagnosis Date Noted   SIRS due to infectious process with acute organ dysfunction (HCC) 03/14/2021   SIRS (systemic inflammatory response syndrome) (HCC) 03/12/2021   Syncope 03/12/2021   Traumatic brain injury with persistent deficit 03/12/2021   Hypokalemia 03/12/2021   Lactic acidosis 03/12/2021   Mixed hyperlipidemia 04/25/2018   Seizure disorder (HCC) 04/11/2018   Mechanical low back pain 05/12/2017   Dysarthria 12/20/2015   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/20/2012   Deafness, sensorineural 04/20/2012   Buzzing in ear 04/20/2012   Leaking percutaneous endoscopic gastrostomy (PEG) tube (HCC) 02/04/2012   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 pre-existing conscious levels, sequela (HCC) 10/12/2011   Cerebral infarct (HCC) 10/12/2011   Spastic hemiplegia of right dominant side due to noncerebrovascular etiology (HCC) 10/12/2011   Dysphagia 10/12/2011   Functioning G-tube 03/24/2011    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 [place], [place], [address] Phone: [phone]   Fax:  [phone]  Name: NEIZAN DEBRUHL MRN: [id number] Date of Birth: 1986-02-25

## 2021-05-09 ENCOUNTER — Other Ambulatory Visit: Payer: Self-pay

## 2021-05-09 ENCOUNTER — Ambulatory Visit: Payer: BC Managed Care – PPO

## 2021-05-09 DIAGNOSIS — R2681 Unsteadiness on feet: Secondary | ICD-10-CM | POA: Diagnosis not present

## 2021-05-09 DIAGNOSIS — M6281 Muscle weakness (generalized): Secondary | ICD-10-CM

## 2021-05-09 DIAGNOSIS — R262 Difficulty in walking, not elsewhere classified: Secondary | ICD-10-CM

## 2021-05-09 DIAGNOSIS — R293 Abnormal posture: Secondary | ICD-10-CM

## 2021-05-09 NOTE — Therapy (Signed)
[facility] [address] [place], [place], [address] Phone: [phone]   Fax:  [phone]  Physical Therapy Treatment  Patient Details  Name: Paul Kane MRN: [id number] Date of Birth: [birth year] Referring Provider (PT): Alger Simons MD   Encounter Date: 05/09/2021   PT End of Session - 05/09/21 1459     Visit Number 11    Number of Visits 13    Date for PT Re-Evaluation 06/06/21    Authorization Type BCBS    Authorization Time Period 04/03/21-06/06/21    Progress Note Due on Visit 13    PT Start Time 1235    PT Stop Time 1315    PT Time Calculation (min) 40 min    Equipment Utilized During Treatment Gait belt    Activity Tolerance Patient tolerated treatment well    Behavior During Therapy WFL for tasks assessed/performed             Past Medical History:  Diagnosis Date   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[place])    Chronic hepatitis B ([place])    Fixed pupils    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[place])    Hypertension    Intracranial injury of other and unspecified nature, without mention of open intracranial wound, unspecified state of consciousness    Intracranial shunt    Paralysis ([facility])    Spastic hemiplegia affecting dominant side ([place])    Stroke ([place])    Traumatic brain injury    Trouble swallowing    Visual disturbance    Weakness     Past Surgical History:  Procedure Laterality Date   cramiectomy     CRANIOPLASTY     REMOVAL OF GASTROSTOMY TUBE     VENTRICULOPERITONEAL SHUNT      There were no vitals filed for this visit.   Subjective Assessment - 05/09/21 1456     Subjective Still not wearing AFO as he does not like it.  [place] last night trying to get to the bathroom at night in the dark denies injury.  new braces to control ankle and knee are being fabricated according to father.    Patient is accompained by: Family member    Pertinent History Hx of CP    How long can you sit  comfortably? unlimited    How long can you stand comfortably? <10 min    How long can you walk comfortably? <5 min    Pain Onset Yesterday                [facility] PT Assessment - 05/09/21 0001       AROM   Right Ankle Dorsiflexion 4      PROM   Right Ankle Dorsiflexion 10      Strength   Right Ankle Dorsiflexion 3+/5                           [facility] Adult PT Treatment/Exercise - 05/09/21 0001       Transfers   Transfers Sit to Stand;Stand to Sit    Sit to Stand 5: Supervision    Five time sit to stand comments  12.9s      Ambulation/Gait   Ambulation/Gait Yes    Ambulation/Gait Assistance 4: Min guard    Ambulation Distance (Feet) 100 Feet    Assistive device None    Gait Pattern Abducted - left;Abducted- right;Wide base of support;Decreased trunk rotation;Ataxic;Right genu recurvatum  Knee/Hip Exercises: Sidelying   Hip ABduction Right    Hip ABduction Limitations 30x with PT maintaining neutral hip position to avoid activiating flexors    Clams r 30x against light manual resistance      Manual Therapy   Manual Therapy Joint mobilization;Passive ROM;Other (comment)    Joint Mobilization mobilizations to increase DF and eversion, Grade 4 static stretch into PF 2 min hold, light manual resistance to DF and eversion, 30x each direction    Passive ROM R ankle DF      Ankle Exercises: Seated   Toe Raise Limitations    Toe Raise Limitations 30 reps in sitting                 Balance Exercises - 05/09/21 0001       Balance Exercises: Standing   Rockerboard Anterior/posterior;Lateral;Intermittent UE support;Limitations    Rockerboard Limitations 60s hold ea. direction    Step Ups Forward;Lateral;4 inch;UE support 1;Limitations    Step Ups Limitations 15x with RLE single UE support                  PT Short Term Goals - 05/09/21 1247       PT SHORT TERM GOAL #1   Title Patient to demo I in initial HEP    Baseline TBD;  04/17/21 Instructed in walking program with new AFO    Time 4    Period Weeks    Status Achieved    Target Date 05/09/21      PT SHORT TERM GOAL #2   Title Patient to perform 5x STS in 12 s or less    Baseline 15.9s; 05/09/21 12.9s    Time 4    Period Weeks    Status Partially Met    Target Date 05/09/21      PT SHORT TERM GOAL #3   Title Assess gait velocity and set goal    Baseline TBD; 04/22/21 Gait velocity 0.49 m/s, goal is 0.6 m/s    Time 4    Period Weeks    Status Achieved    Target Date 05/09/21      PT SHORT TERM GOAL #4   Title Assess DGI and set Goal    Baseline TBD; 04/17/21 DGI score 18/24, goal is 20    Time 4    Period Weeks    Status Achieved    Target Date 05/09/21               PT Long Term Goals - 05/09/21 1247       PT LONG TERM GOAL #1   Title The patient will demonstrate initiation and completion of HEP with supervision from caregiver at home    Baseline TBD; [id number]    Time 8    Period Weeks    Status New      PT LONG TERM GOAL #2   Title Patient to regain 5d R DF AROM    Baseline 0d AROM R DF; 05/09/21 4d AROM DF    Time 8    Period Weeks    Status Partially Met      PT LONG TERM GOAL #3   Title The patient will ambulate 500' outdoors mod I over varying surfaces and surface heights including grass, curbs, and inclines to demonstrate improved functional mobility with community ambulation.    Baseline 161f in clinic with CGA    Time 8    Period Weeks    Status New  PT LONG TERM GOAL #4   Title Assess DGI goal; 05/09/21 Goal is 20    Baseline TBD; DGI 20    Time 8    Period Weeks    Status New      PT LONG TERM GOAL #5   Title Decrease TUG score to 12s    Baseline 15.9s initially    Time 8    Period Weeks    Status New      PT LONG TERM GOAL #6   Title Increase R DF, Knee extension, hip flexion and abduction to 4/5    Baseline 3+/5 throughout    Time 8    Period Weeks    Status New                    Plan - 05/09/21 1500     Clinical Impression Statement Today R ankle reassessed to address progress towards ROM and strength which have both improved to 4d AROM and 10d PROM, strength 3+/5 in available ROM.  Continued R hip strengthening focused on abduction and gluteus medius strengthening.  Continued closed chain ankle strengthening and introduced rocker board as an unstable surface.  R knee hyper extension appears to be more problematic than ankle supination    Stability/Clinical Decision Making Stable/Uncomplicated    Rehab Potential Good    PT Frequency 2x / week    PT Duration 6 weeks    PT Treatment/Interventions ADLs/Self Care Home Management;Aquatic Therapy;DME Instruction;Gait training;Stair training;Functional mobility training;Therapeutic activities;Therapeutic exercise;Balance training;Neuromuscular re-education;Patient/family education;Orthotic Fit/Training;Manual techniques    PT Next Visit Plan continue stepping tasks for R ankle strength and proprioception, R hip strengthening, TUG and DGI    PT Home Exercise Plan [email or web address]    Consulted and Agree with Plan of Care Family member/caregiver             Patient will benefit from skilled therapeutic intervention in order to improve the following deficits and impairments:  Abnormal gait, Decreased endurance, Decreased activity tolerance, Decreased strength, Decreased balance, Decreased mobility, Difficulty walking, Increased muscle spasms, Decreased range of motion, Decreased coordination  Visit Diagnosis: Abnormal posture  Difficulty in walking, not elsewhere classified  Muscle weakness (generalized)  Unsteadiness on feet     Problem List Patient Active Problem List   Diagnosis Date Noted   SIRS due to infectious process with acute organ dysfunction ([place]) 03/14/2021   SIRS (systemic inflammatory response syndrome) ([place]) 03/12/2021   Syncope 03/12/2021   Traumatic brain injury with persistent deficit 03/12/2021    Hypokalemia 03/12/2021   Lactic acidosis 03/12/2021   Mixed hyperlipidemia 04/25/2018   Seizure disorder ([place]) 04/11/2018   Mechanical low back pain 05/12/2017   Dysarthria 12/20/2015   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/20/2012   Deafness, sensorineural 04/20/2012   Buzzing in ear 04/20/2012   Leaking percutaneous endoscopic gastrostomy (PEG) tube ([place]) 02/04/2012   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 pre-existing conscious levels, sequela ([place]) 10/12/2011   Cerebral infarct ([place]) 10/12/2011   Spastic hemiplegia of right dominant side due to noncerebrovascular etiology ([place]) 10/12/2011   Dysphagia 10/12/2011   Functioning G-tube 03/24/2011   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 [place], [place], [address] Phone: [phone]   Fax:  [phone]  Name: Paul Kane MRN: [id number] Date of Birth: Nov 11, 1985

## 2021-05-14 ENCOUNTER — Encounter (HOSPITAL_BASED_OUTPATIENT_CLINIC_OR_DEPARTMENT_OTHER): Payer: Self-pay | Admitting: Physical Therapy

## 2021-05-14 ENCOUNTER — Ambulatory Visit (HOSPITAL_BASED_OUTPATIENT_CLINIC_OR_DEPARTMENT_OTHER): Payer: BC Managed Care – PPO | Admitting: Physical Therapy

## 2021-05-14 ENCOUNTER — Other Ambulatory Visit: Payer: Self-pay

## 2021-05-14 DIAGNOSIS — R2681 Unsteadiness on feet: Secondary | ICD-10-CM | POA: Diagnosis not present

## 2021-05-14 DIAGNOSIS — R293 Abnormal posture: Secondary | ICD-10-CM

## 2021-05-14 DIAGNOSIS — M6281 Muscle weakness (generalized): Secondary | ICD-10-CM

## 2021-05-14 DIAGNOSIS — G8929 Other chronic pain: Secondary | ICD-10-CM

## 2021-05-14 DIAGNOSIS — R262 Difficulty in walking, not elsewhere classified: Secondary | ICD-10-CM

## 2021-05-14 DIAGNOSIS — R2689 Other abnormalities of gait and mobility: Secondary | ICD-10-CM

## 2021-05-14 NOTE — Therapy (Signed)
[facility] [place], [place], [address] Phone: [phone]   Fax:  [phone]  Physical Therapy Treatment  Patient Details  Name: Paul Kane MRN: [id number] Date of Birth: March 13, 1986 Referring Provider (PT): Alger Simons MD   Encounter Date: 05/14/2021   PT End of Session - 05/14/21 0917     Visit Number 12    Number of Visits 13    Date for PT Re-Evaluation 06/06/21    Authorization Type BCBS    Authorization Time Period 04/03/21-06/06/21    Progress Note Due on Visit 13    PT Start Time 0908    PT Stop Time 0947    PT Time Calculation (min) 39 min    Equipment Utilized During Treatment Gait belt    Activity Tolerance Patient tolerated treatment well    Behavior During Therapy WFL for tasks assessed/performed             Past Medical History:  Diagnosis Date   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[place])    Chronic hepatitis B ([place])    Fixed pupils    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   Subjective Assessment - 05/14/21 1407     Subjective No questions or concerns                                              PT Short Term Goals - 05/09/21 1247       PT SHORT TERM GOAL #1   Title Patient to demo I in initial HEP    Baseline TBD; 04/17/21 Instructed in walking program with new AFO    Time 4    Period Weeks     Status Achieved    Target Date 05/09/21      PT SHORT TERM GOAL #2   Title Patient to perform 5x STS in 12 s or less    Baseline 15.9s; 05/09/21 12.9s    Time 4    Period Weeks    Status Partially Met    Target Date 05/09/21      PT SHORT TERM GOAL #3   Title Assess gait velocity and set goal    Baseline TBD; 04/22/21 Gait velocity 0.49 m/s, goal is 0.6 m/s    Time 4    Period Weeks    Status Achieved    Target Date 05/09/21      PT SHORT TERM GOAL #4   Title Assess DGI and set Goal    Baseline TBD; 04/17/21 DGI score 18/24, goal is 20    Time 4    Period Weeks    Status Achieved    Target Date 05/09/21  PT Long Term Goals - 05/09/21 1247       PT LONG TERM GOAL #1   Title The patient will demonstrate initiation and completion of HEP with supervision from caregiver at home    Baseline TBD; [id number]    Time 8    Period Weeks    Status New      PT LONG TERM GOAL #2   Title Patient to regain 5d R DF AROM    Baseline 0d AROM R DF; 05/09/21 4d AROM DF    Time 8    Period Weeks    Status Partially Met      PT LONG TERM GOAL #3   Title The patient will ambulate 500' outdoors mod I over varying surfaces and surface heights including grass, curbs, and inclines to demonstrate improved functional mobility with community ambulation.    Baseline 140f in clinic with CGA    Time 8    Period Weeks    Status New      PT LONG TERM GOAL #4   Title Assess DGI goal; 05/09/21 Goal is 20    Baseline TBD; DGI 20    Time 8    Period Weeks    Status New      PT LONG TERM GOAL #5   Title Decrease TUG score to 12s    Baseline 15.9s initially    Time 8    Period Weeks    Status New      PT LONG TERM GOAL #6   Title Increase R DF, Knee extension, hip flexion and abduction to 4/5    Baseline 3+/5 throughout    Time 8    Period Weeks    Status New               Pt seen for aquatic therapy today.  Treatment took place in water 3.25-4.8 ft in depth  [facility] Temp of water was 91.  Pt entered/exited the pool via stairs step to pattern cga with bilat rail.   Warm up: water walking unsupported 6 widths and 2 lengths forward backward and side stepping. Pt complete with distant supervision -VC for balance, posture and recovering from LOB     Step ups completed submerged to chest, leading with each LE onto water step x 10 reps holding unilaterally to wall then without ue support. VC for placement and control of LLE, gaining standing balance prior to initiating step Side stepping over water step  x 10 decreasing to No ue support.  VC as above   Squats submerged to waist 1/3 x 10.  VC and TC for LLE knee control. Supine suspension supported by squoodle, cervical buoy and therapist: knees to chest 2 x 10 reps lower abd/core strengthening, knees chest with R/L rotation x 10 -kicking and scissoring - Pushing off wall bilateral then R and L multiple trials/reps.   Pt requires buoyancy for support and to offload joints with strengthening exercises. Viscosity of the water is needed for resistance of strengthening; water current perturbations provides challenge to standing balance unsupported, requiring increased core activation.         Plan - 05/14/21 0953     Clinical Impression Statement Improved balance submerged at waist.  Pt amb throughout pool with supervision and only slight LOB which he recovers indep in all depths.  He focuses well today with proprioceptive exercises, is able to gain balance indep prior to each step up in prep to initiate next movement.  Aquatics continues to be excellent environement for pt to gain strength and improve balance due to lack of fear of falling    Stability/Clinical Decision Making Stable/Uncomplicated    Rehab Potential Good    PT Frequency 2x / week    PT Duration 6 weeks    PT Treatment/Interventions ADLs/Self Care Home Management;Aquatic Therapy;DME Instruction;Gait  training;Stair training;Functional mobility training;Therapeutic activities;Therapeutic exercise;Balance training;Neuromuscular re-education;Patient/family education;Orthotic Fit/Training;Manual techniques    PT Home Exercise Plan [id number]              Patient will benefit from skilled therapeutic intervention in order to improve the following deficits and impairments:  Abnormal gait, Decreased endurance, Decreased activity tolerance, Decreased strength, Decreased balance, Decreased mobility, Difficulty walking, Increased muscle spasms, Decreased range of motion, Decreased coordination  Visit Diagnosis: Abnormal posture  Other abnormalities of gait and mobility  Unsteadiness on feet  Chronic pain of right knee  Difficulty in walking, not elsewhere classified  Muscle weakness (generalized)     Problem List Patient Active Problem List   Diagnosis Date Noted   SIRS due to infectious process with acute organ dysfunction ([place]) 03/14/2021   SIRS (systemic inflammatory response syndrome) ([place]) 03/12/2021   Syncope 03/12/2021   Traumatic brain injury with persistent deficit 03/12/2021   Hypokalemia 03/12/2021   Lactic acidosis 03/12/2021   Mixed hyperlipidemia 04/25/2018   Seizure disorder ([place]) 04/11/2018   Mechanical low back pain 05/12/2017   Dysarthria 12/20/2015   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/20/2012   Deafness, sensorineural 04/20/2012   Buzzing in ear 04/20/2012   Leaking percutaneous endoscopic gastrostomy (PEG) tube ([place]) 02/04/2012   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 pre-existing conscious levels, sequela ([place]) 10/12/2011   Cerebral infarct ([place]) 10/12/2011   Spastic hemiplegia of right dominant side due to noncerebrovascular etiology ([place]) 10/12/2011   Dysphagia 10/12/2011   Functioning G-tube 03/24/2011   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 [place], [place], [address] Phone: [phone]   Fax:  [phone]  Name: Paul Kane MRN: [id number] Date of Birth: 01/28/1986

## 2021-05-16 ENCOUNTER — Other Ambulatory Visit: Payer: Self-pay

## 2021-05-16 ENCOUNTER — Ambulatory Visit: Payer: BC Managed Care – PPO

## 2021-05-16 DIAGNOSIS — R2689 Other abnormalities of gait and mobility: Secondary | ICD-10-CM

## 2021-05-16 DIAGNOSIS — R2681 Unsteadiness on feet: Secondary | ICD-10-CM

## 2021-05-16 DIAGNOSIS — R262 Difficulty in walking, not elsewhere classified: Secondary | ICD-10-CM

## 2021-05-16 DIAGNOSIS — R293 Abnormal posture: Secondary | ICD-10-CM

## 2021-05-18 NOTE — Therapy (Signed)
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        05/16/21 1324  PT Visits / Re-Eval  Visit Number 13  Number of Visits 13  Date for PT Re-Evaluation 06/06/21  Authorization  Authorization Type BCBS  Authorization Time Period 04/03/21-06/06/21  Progress Note Due on Visit 13  PT Time Calculation  PT Start Time 1320  PT Stop Time 1400  PT Time Calculation (min) 40 min  PT - End of Session  Equipment Utilized During Treatment Gait belt  Activity Tolerance Patient tolerated treatment well  Behavior During Therapy WFL for tasks assessed/performed      05/16/21 0001  AROM  Right Ankle Dorsiflexion 8  PROM  Right Ankle Dorsiflexion 14  Strength  Right Knee Extension 4-/5  Right Ankle Dorsiflexion 4/5  Right Hip ADduction 3+/5  Transfers  Transfers Sit to Stand;Stand to Sit  Sit to Stand 5: Supervision  Five time sit to stand comments  17.2s  Ambulation/Gait   Ambulation/Gait Yes  Ambulation/Gait Assistance 5: Supervision  Ambulation Distance (Feet) 100 Feet  Assistive device None  Gait Pattern Abducted - left;Abducted- right;Wide base of support;Decreased trunk rotation;Ataxic;Right genu recurvatum      05/16/21 0001  Transfers  Transfers Sit to Stand;Stand to Sit  Sit to Stand 5: Supervision  Five time sit to stand comments  17.2s  Ambulation/Gait  Ambulation/Gait Yes  Ambulation/Gait Assistance 5: Supervision  Ambulation Distance (Feet) 100 Feet  Assistive device None  Gait Pattern Abducted - left;Abducted- right;Wide base of support;Decreased trunk rotation;Ataxic;Right genu recurvatum  Knee/Hip Exercises: Aerobic  Stepper Scifit seat 16, no UEs, L4 x8'       05/16/21 0001  Balance Exercises: Standing  Step Ups Forward;Lateral;4 inch;UE support 1;Limitations  Step Ups Limitations 15x with each LE each direction                      PT Short Term Goals - 05/16/21 1330       PT SHORT TERM GOAL #1   Title Patient to demo I in initial HEP    Baseline TBD; 04/17/21 Instructed in walking program with new AFO    Time 4    Period Weeks    Status Achieved    Target Date 05/09/21      PT SHORT TERM GOAL #2   Title Patient to perform  5x STS in 12 s or less    Baseline 15.9s; 05/09/21 12.9s; 05/16/21 5x STS 17.2s    Time 4    Period Weeks    Status Partially Met    Target Date 05/09/21      PT SHORT TERM GOAL #3   Title Assess gait velocity and set goal    Baseline TBD; 04/22/21 Gait velocity 0.49 m/s, goal is 0.6 m/s    Time 4    Period Weeks    Status Achieved    Target Date 05/09/21      PT SHORT TERM GOAL #4   Title Assess DGI and set Goal    Baseline TBD; 04/17/21 DGI score 18/24, goal is 20    Time 4    Period Weeks    Status Achieved    Target Date 05/09/21               PT Long Term Goals - 05/16/21 1331       PT LONG TERM GOAL #1   Title The patient will demonstrate initiation  and completion of HEP with supervision from caregiver at home    Baseline TBD; [place]; 10/28/2 patient reluctant to perform HEP consistently    Time 8    Period Weeks    Status On-going    Target Date 07/20/21      PT LONG TERM GOAL #2   Title Patient to regain 5d R DF AROM    Baseline 0d AROM R DF; 05/09/21 4d AROM DF; 05/16/21 8d AROM, 14d PROM R DF    Time 8    Period Weeks    Status Achieved      PT LONG TERM GOAL #3   Title The patient will ambulate 500' outdoors mod I over varying surfaces and surface heights including grass, curbs, and inclines to demonstrate improved functional mobility with community ambulation.    Baseline 131f in clinic with CGA; 05/16/21 UTA due to time constraints    Time 8    Period Weeks    Status On-going    Target Date 07/20/21      PT LONG TERM GOAL #4   Title Assess DGI goal; 05/09/21 Goal is 20    Baseline TBD; DGI 17; 05/16/21 UTA due to time constarints    Time 8    Period Weeks    Status On-going    Target Date 07/20/21      PT LONG TERM GOAL #5   Title Decrease TUG score to 12s    Baseline 15.9s initially; 05/16/21 17.2s    Time 8    Period Weeks    Status Partially Met      PT LONG TERM GOAL #6   Title Increase R DF, Knee extension, hip flexion and abduction to 4/5    Baseline 3+/5 throughout; 05/16/21 4/5 R DF, 3+/5 r hip flexion, 4-/5 R knee ext.    Time 8    Period Weeks    Status On-going    Target Date 07/20/21                    Patient will benefit from skilled therapeutic intervention in order to improve the following deficits and impairments:  Abnormal gait, Decreased endurance, Decreased activity tolerance, Decreased strength, Decreased balance, Decreased mobility, Difficulty walking, Increased muscle spasms, Decreased range of motion, Decreased coordination  Visit Diagnosis: Abnormal posture  Other abnormalities of gait and mobility  Unsteadiness on feet  Difficulty in walking, not  elsewhere  classified     Problem List Patient Active Problem List   Diagnosis Date Noted   SIRS due to infectious process with acute organ dysfunction ([place]) 03/14/2021   SIRS (systemic inflammatory response syndrome) ([place]) 03/12/2021   Syncope 03/12/2021   Traumatic brain injury with persistent deficit 03/12/2021   Hypokalemia 03/12/2021   Lactic acidosis 03/12/2021   Mixed hyperlipidemia 04/25/2018   Seizure disorder ([place]) 04/11/2018   Mechanical low back pain 05/12/2017   Dysarthria 12/20/2015   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/20/2012   Deafness, sensorineural 04/20/2012   Buzzing in ear 04/20/2012   Leaking percutaneous endoscopic gastrostomy (PEG) tube ([place]) 02/04/2012   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 pre-existing conscious levels, sequela ([place]) 10/12/2011   Cerebral infarct ([place]) 10/12/2011   Spastic hemiplegia of right dominant side due to noncerebrovascular etiology ([place]) 10/12/2011   Dysphagia 10/12/2011   Functioning G-tube 03/24/2011   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 [place], [place], [address] Phone: [phone]   Fax:  [phone]  Name: Paul Kane MRN: [id number] Date of Birth: 1985-10-28

## 2021-05-21 ENCOUNTER — Other Ambulatory Visit: Payer: Self-pay

## 2021-05-21 ENCOUNTER — Encounter (HOSPITAL_BASED_OUTPATIENT_CLINIC_OR_DEPARTMENT_OTHER): Payer: Self-pay | Admitting: Physical Therapy

## 2021-05-21 ENCOUNTER — Ambulatory Visit (HOSPITAL_BASED_OUTPATIENT_CLINIC_OR_DEPARTMENT_OTHER): Payer: BC Managed Care – PPO | Attending: Physical Medicine & Rehabilitation | Admitting: Physical Therapy

## 2021-05-21 DIAGNOSIS — M6281 Muscle weakness (generalized): Secondary | ICD-10-CM | POA: Diagnosis present

## 2021-05-21 DIAGNOSIS — R2689 Other abnormalities of gait and mobility: Secondary | ICD-10-CM | POA: Diagnosis present

## 2021-05-21 DIAGNOSIS — R262 Difficulty in walking, not elsewhere classified: Secondary | ICD-10-CM

## 2021-05-21 DIAGNOSIS — R2681 Unsteadiness on feet: Secondary | ICD-10-CM

## 2021-05-21 DIAGNOSIS — M25561 Pain in right knee: Secondary | ICD-10-CM | POA: Insufficient documentation

## 2021-05-21 DIAGNOSIS — G8929 Other chronic pain: Secondary | ICD-10-CM

## 2021-05-21 DIAGNOSIS — R293 Abnormal posture: Secondary | ICD-10-CM

## 2021-05-21 NOTE — Therapy (Signed)
[facility] [place], [place], [address] Phone: [phone]   Fax:  [phone]  Physical Therapy Treatment  Patient Details  Name: Paul Kane MRN: [id number] Date of Birth: October 29, 1985 Referring Provider (PT): Alger Simons MD   Encounter Date: 05/21/2021   PT End of Session - 05/21/21 1210     Visit Number 14    Number of Visits 29    Date for PT Re-Evaluation 07/11/21    Authorization Type BCBS    Authorization Time Period 06/16/21-07/12/21    Progress Note Due on Visit 13    PT Start Time 1206    PT Stop Time 1245    PT Time Calculation (min) 39 min    Equipment Utilized During Treatment Gait belt    Activity Tolerance Patient tolerated treatment well    Behavior During Therapy WFL for tasks assessed/performed             Past Medical History:  Diagnosis Date   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[place])    Chronic hepatitis B ([place])    Fixed pupils    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   Subjective Assessment - 05/21/21 1304     Subjective No c/o pain.                                          PT Short Term Goals - 05/16/21 1330       PT SHORT TERM GOAL #1   Title Patient to demo I in initial HEP    Baseline TBD; 04/17/21 Instructed in walking program with new AFO    Time 4    Period Weeks    Status Achieved     Target Date 05/09/21      PT SHORT TERM GOAL #2   Title Patient to perform 5x STS in 12 s or less    Baseline 15.9s; 05/09/21 12.9s; 05/16/21 5x STS 17.2s    Time 4    Period Weeks    Status Partially Met    Target Date 05/09/21      PT SHORT TERM GOAL #3   Title Assess gait velocity and set goal    Baseline TBD; 04/22/21 Gait velocity 0.49 m/s, goal is 0.6 m/s    Time 4    Period Weeks    Status Achieved    Target Date 05/09/21      PT SHORT TERM GOAL #4   Title Assess DGI and set Goal    Baseline TBD; 04/17/21 DGI score 18/24, goal is 20    Time 4    Period Weeks    Status Achieved    Target Date 05/09/21  PT Long Term Goals - 05/16/21 1331       PT LONG TERM GOAL #1   Title The patient will demonstrate initiation and completion of HEP with supervision from caregiver at home    Baseline TBD; [id number]; 10/28/2 patient reluctant to perform HEP consistently    Time 8    Period Weeks    Status On-going    Target Date 07/20/21      PT LONG TERM GOAL #2   Title Patient to regain 5d R DF AROM    Baseline 0d AROM R DF; 05/09/21 4d AROM DF; 05/16/21 8d AROM, 14d PROM R DF    Time 8    Period Weeks    Status Achieved      PT LONG TERM GOAL #3   Title The patient will ambulate 500' outdoors mod I over varying surfaces and surface heights including grass, curbs, and inclines to demonstrate improved functional mobility with community ambulation.    Baseline 174f in clinic with CGA; 05/16/21 UTA due to time constraints    Time 8    Period Weeks    Status On-going    Target Date 07/20/21      PT LONG TERM GOAL #4   Title Assess DGI goal; 05/09/21 Goal is 20    Baseline TBD; DGI 17; 05/16/21 UTA due to time constarints    Time 8    Period Weeks    Status On-going    Target Date 07/20/21      PT LONG TERM GOAL #5   Title Decrease TUG score to 12s    Baseline 15.9s initially; 05/16/21 17.2s    Time 8    Period Weeks    Status Partially Met       PT LONG TERM GOAL #6   Title Increase R DF, Knee extension, hip flexion and abduction to 4/5    Baseline 3+/5 throughout; 05/16/21 4/5 R DF, 3+/5 r hip flexion, 4-/5 R knee ext.    Time 8    Period Weeks    Status On-going    Target Date 07/20/21           Pt seen for aquatic therapy today.  Treatment took place in water 3.25-4.8 ft [facility] Temp of water was 91.  Pt entered/exited the pool via stairs step to pattern cga with bilat rail.   Warm up: water walking unsupported 6 lengths forward, backward and side stepping. Pt complete with distant supervision -VC for  straight path, balance, and cadence     Step ups completed submerged to chest, leading with each LE onto water step x 10 reps without ue support, cues to reach for wall if needed to stabilize. VC for placement and control of LLE, slow pacing and gaining standing balance prior to initiating step Side stepping over water step  x 10 decreasing to No ue support.  VC as above  Supine suspension supported by squoodle, cervical buoy and therapist: knees to chest 2 x 10 reps lower abd/core strengthening, knees chest with R/L rotation x 10 -kicking and scissoring multiple lengths of pool - Pushing off wall bilateral then R and L multiple trials/reps.   Pt requires buoyancy for support and to offload joints with strengthening exercises. Viscosity of the water is needed for resistance of strengthening; water current perturbations provides challenge to standing balance unsupported, requiring increased core activation.         Plan - 05/21/21 1304  Clinical Impression Statement Focused on right hip and knee strength as well as propprioception.  He amb thorughout pool unassisted and unsupported. Continued difficulty with maintaining standing balance with step ups and step overs although pt able to regain position with minimal assistance    Stability/Clinical Decision Making Stable/Uncomplicated     Rehab Potential Good    PT Frequency 2x / week    PT Duration 8 weeks    PT Treatment/Interventions ADLs/Self Care Home Management;Aquatic Therapy;DME Instruction;Gait training;Stair training;Functional mobility training;Therapeutic activities;Therapeutic exercise;Balance training;Neuromuscular re-education;Patient/family education;Orthotic Fit/Training;Manual techniques    PT Next Visit Plan continue stepping tasks for R ankle strength and proprioception, R hip strengthening, TUG and DGI to assess progress    PT Home Exercise Plan [id number]             Patient will benefit from skilled therapeutic intervention in order to improve the following deficits and impairments:  Abnormal gait, Decreased endurance, Decreased activity tolerance, Decreased strength, Decreased balance, Decreased mobility, Difficulty walking, Increased muscle spasms, Decreased range of motion, Decreased coordination  Visit Diagnosis: Abnormal posture  Other abnormalities of gait and mobility  Unsteadiness on feet  Chronic pain of right knee  Difficulty in walking, not elsewhere classified  Muscle weakness (generalized)     Problem List Patient Active Problem List   Diagnosis Date Noted   SIRS due to infectious process with acute organ dysfunction ([place]) 03/14/2021   SIRS (systemic inflammatory response syndrome) ([place]) 03/12/2021   Syncope 03/12/2021   Traumatic brain injury with persistent deficit 03/12/2021   Hypokalemia 03/12/2021   Lactic acidosis 03/12/2021   Mixed hyperlipidemia 04/25/2018   Seizure disorder ([place]) 04/11/2018   Mechanical low back pain 05/12/2017   Dysarthria 12/20/2015   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/20/2012   Deafness, sensorineural 04/20/2012   Buzzing in ear 04/20/2012   Leaking percutaneous endoscopic gastrostomy (PEG) tube ([place]) 02/04/2012   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 pre-existing conscious levels, sequela  ([place]) 10/12/2011   Cerebral infarct ([place]) 10/12/2011   Spastic hemiplegia of right dominant side due to noncerebrovascular etiology ([place]) 10/12/2011   Dysphagia 10/12/2011   Functioning G-tube 03/24/2011   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

## 2021-05-23 ENCOUNTER — Other Ambulatory Visit: Payer: Self-pay

## 2021-05-23 ENCOUNTER — Ambulatory Visit: Payer: BC Managed Care – PPO | Attending: Physical Medicine & Rehabilitation

## 2021-05-23 DIAGNOSIS — M6281 Muscle weakness (generalized): Secondary | ICD-10-CM | POA: Diagnosis present

## 2021-05-23 DIAGNOSIS — R2689 Other abnormalities of gait and mobility: Secondary | ICD-10-CM | POA: Insufficient documentation

## 2021-05-23 DIAGNOSIS — R293 Abnormal posture: Secondary | ICD-10-CM | POA: Diagnosis present

## 2021-05-23 DIAGNOSIS — R2681 Unsteadiness on feet: Secondary | ICD-10-CM | POA: Insufficient documentation

## 2021-05-23 DIAGNOSIS — R262 Difficulty in walking, not elsewhere classified: Secondary | ICD-10-CM | POA: Diagnosis present

## 2021-05-23 NOTE — Therapy (Signed)
[facility] [address] [place], [place], [address] Phone: [phone]   Fax:  [phone]  Physical Therapy Treatment  Patient Details  Name: Paul Kane MRN: [id number] Date of Birth: 04-20-1986 Referring Provider (PT): Alger Simons MD   Encounter Date: 05/23/2021   PT End of Session - 05/23/21 1536     Visit Number 15    Number of Visits 29    Date for PT Re-Evaluation 07/11/21    Authorization Type BCBS    Authorization Time Period 06/16/21-07/12/21    Progress Note Due on Visit 13    PT Start Time 1325    PT Stop Time 1400    PT Time Calculation (min) 35 min    Equipment Utilized During Treatment Gait belt    Activity Tolerance Patient tolerated treatment well    Behavior During Therapy WFL for tasks assessed/performed             Past Medical History:  Diagnosis Date   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[place])    Chronic hepatitis B ([place])    Fixed pupils    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   Subjective Assessment - 05/23/21 1325     Subjective No changes to note, braces still being fabrication    Patient is accompained by: Family member    Pertinent History Hx of CP    How long can you sit comfortably? unlimited    How long can you stand comfortably? <10 min    How long can you walk comfortably? <5 min    Pain Onset Yesterday                 [facility] PT Assessment - 05/23/21 0001       Transfers   Transfers Sit to Stand;Stand to Sit;Stand Pivot Transfers    Sit to Stand 6: Modified independent (Device/Increase time);5: Supervision    Five time sit to stand comments  9.72    Stand to Sit 6: Modified independent (Device/Increase time);5: Supervision    Stand Pivot Transfers 5: Supervision;6: Modified independent (Device/Increase time)      Ambulation/Gait   Ambulation/Gait Yes    Ambulation/Gait Assistance 4: Min guard    Ambulation Distance (Feet) 300 Feet    Assistive device None    Gait Pattern Abducted - left;Abducted- right;Wide base of support;Decreased trunk rotation;Ataxic;Right genu recurvatum      Dynamic Gait Index   Level Surface Mild Impairment    Change in Gait Speed Mild Impairment    Gait with Horizontal Head Turns Normal    Gait with Vertical Head Turns Normal    Gait and Pivot Turn Mild Impairment    Step Over Obstacle Mild Impairment    Step Around Obstacles Mild Impairment    Steps Mild Impairment    Total Score  18      Timed Up and Go Test   Normal TUG (seconds) 12                           [facility] Adult PT Treatment/Exercise - 05/23/21 0001       Manual Therapy   Manual Therapy Soft tissue mobilization    Soft tissue mobilization assessment of R hip mobilty as well as quad/hip flexor stretch                 Balance Exercises - 05/23/21 0001       Balance Exercises: Standing   Step Ups Forward;Lateral;6 inch;UE support 1;Limitations    Step Ups Limitations 10x with R foot stabilized on step                  PT Short Term Goals - 05/23/21 1548       PT SHORT TERM GOAL #1   Title Patient to demo I in initial HEP    Baseline TBD; 04/17/21 Instructed in walking program with new AFO    Time 4    Period Weeks    Status Achieved    Target Date 05/09/21      PT SHORT TERM GOAL #2   Title Patient to perform 5x STS in 12 s or less    Baseline  15.9s; 05/09/21 12.9s; 05/16/21 5x STS 17.2s; 05/23/21 9.72s    Time 4    Period Weeks    Status Achieved    Target Date 05/09/21      PT SHORT TERM GOAL #3   Title Assess gait velocity and set goal    Baseline TBD; 04/22/21 Gait velocity 0.49 m/s, goal is 0.6 m/s    Time 4    Period Weeks    Status Achieved    Target Date 05/09/21      PT SHORT TERM GOAL #4   Title Assess DGI and set Goal    Baseline TBD; 04/17/21 DGI score 18/24, goal is 20; 05/23/21 DGI score 18    Time 4    Period Weeks    Status Achieved    Target Date 05/09/21               PT Long Term Goals - 05/23/21 1550       PT LONG TERM GOAL #1   Title The patient will demonstrate initiation and completion of HEP with supervision from caregiver at home    Baseline TBD; [id number]; 10/28/2 patient reluctant to perform HEP consistently    Time 8    Period Weeks    Status On-going      PT LONG TERM GOAL #2   Title Patient to regain 5d R DF AROM    Baseline 0d AROM R DF; 05/09/21 4d AROM DF; 05/16/21 8d AROM, 14d PROM R DF    Time 8    Period Weeks    Status Achieved      PT LONG TERM GOAL #3   Title The patient will ambulate 500' outdoors mod I over varying surfaces and surface heights including grass, curbs, and inclines to demonstrate improved functional mobility with community ambulation.    Baseline 164f in clinic with CGA; 05/16/21 UTA due to time constraints    Time 8    Period Weeks    Status On-going      PT LONG TERM GOAL #4   Title Assess DGI goal; 05/09/21 Goal is  20    Baseline TBD; DGI 17; 05/16/21 UTA due to time constarints; 05/23/21 DGI score 18    Time 8    Period Weeks    Status On-going      PT LONG TERM GOAL #5   Title Decrease TUG score to 12s    Baseline 15.9s initially; 05/16/21 17.2s; 05/23/21 TUG 12.0s    Time 8    Period Weeks    Status Partially Met      PT LONG TERM GOAL #6   Title Increase R DF, Knee extension, hip flexion and abduction to 4/5    Baseline 3+/5  throughout; 05/16/21 4/5 R DF, 3+/5 r hip flexion, 4-/5 R knee ext.    Time 8    Period Weeks    Status On-going                   Plan - 05/23/21 1538     Clinical Impression Statement Todays session assessed progress towards DGI, 5x STS and TUG with patient meeting TUG and STS goals.  DGI score improved but goal not yet met.  DGI score may have plateaued due to diagnosis but benefit from reassessment with new braces.    Personal Factors and Comorbidities Comorbidity 1    Comorbidities CP    Examination-Activity Limitations Locomotion Level    Stability/Clinical Decision Making Stable/Uncomplicated    Rehab Potential Good    PT Frequency 2x / week    PT Duration 8 weeks    PT Treatment/Interventions ADLs/Self Care Home Management;Aquatic Therapy;DME Instruction;Gait training;Stair training;Functional mobility training;Therapeutic activities;Therapeutic exercise;Balance training;Neuromuscular re-education;Patient/family education;Orthotic Fit/Training;Manual techniques    PT Next Visit Plan continue stepping tasks for R ankle strength and proprioception, R hip strengthening,  R hip stability drills    PT Home Exercise Plan [place]             Patient will benefit from skilled therapeutic intervention in order to improve the following deficits and impairments:  Abnormal gait, Decreased endurance, Decreased activity tolerance, Decreased strength, Decreased balance, Decreased mobility, Difficulty walking, Increased muscle spasms, Decreased range of motion, Decreased coordination  Visit Diagnosis: Unsteadiness on feet  Difficulty in walking, not elsewhere classified  Muscle weakness (generalized)     Problem List Patient Active Problem List   Diagnosis Date Noted   SIRS due to infectious process with acute organ dysfunction ([place]) 03/14/2021   SIRS (systemic inflammatory response syndrome) ([place]) 03/12/2021   Syncope 03/12/2021   Traumatic brain injury with persistent  deficit 03/12/2021   Hypokalemia 03/12/2021   Lactic acidosis 03/12/2021   Mixed hyperlipidemia 04/25/2018   Seizure disorder ([place]) 04/11/2018   Mechanical low back pain 05/12/2017   Dysarthria 12/20/2015   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/20/2012   Deafness, sensorineural 04/20/2012   Buzzing in ear 04/20/2012   Leaking percutaneous endoscopic gastrostomy (PEG) tube ([place]) 02/04/2012   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 pre-existing conscious levels, sequela ([place]) 10/12/2011   Cerebral infarct ([place]) 10/12/2011   Spastic hemiplegia of right dominant side due to noncerebrovascular etiology ([place]) 10/12/2011   Dysphagia 10/12/2011   Functioning G-tube 03/24/2011   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 [place], [place], [address] Phone: [phone]   Fax:  [phone]  Name: Paul Kane MRN: [id number] Date of Birth: 21-Jul-1985

## 2021-05-26 ENCOUNTER — Other Ambulatory Visit: Payer: Self-pay

## 2021-05-26 ENCOUNTER — Ambulatory Visit (HOSPITAL_BASED_OUTPATIENT_CLINIC_OR_DEPARTMENT_OTHER): Payer: BC Managed Care – PPO | Admitting: Physical Therapy

## 2021-05-26 ENCOUNTER — Encounter (HOSPITAL_BASED_OUTPATIENT_CLINIC_OR_DEPARTMENT_OTHER): Payer: Self-pay | Admitting: Physical Therapy

## 2021-05-26 DIAGNOSIS — M6281 Muscle weakness (generalized): Secondary | ICD-10-CM

## 2021-05-26 DIAGNOSIS — R293 Abnormal posture: Secondary | ICD-10-CM | POA: Diagnosis not present

## 2021-05-26 DIAGNOSIS — R262 Difficulty in walking, not elsewhere classified: Secondary | ICD-10-CM

## 2021-05-26 DIAGNOSIS — G8929 Other chronic pain: Secondary | ICD-10-CM

## 2021-05-26 DIAGNOSIS — R2681 Unsteadiness on feet: Secondary | ICD-10-CM

## 2021-05-26 DIAGNOSIS — R2689 Other abnormalities of gait and mobility: Secondary | ICD-10-CM

## 2021-05-26 NOTE — Therapy (Signed)
[facility] [place], [place], [address] Phone: [phone]   Fax:  [phone]  Physical Therapy Treatment  Patient Details  Name: Paul Kane MRN: [id number] Date of Birth: [birth year] Referring Provider (PT): Alger Simons MD   Encounter Date: 05/26/2021   PT End of Session - 05/26/21 1606     Visit Number 16    Number of Visits 29    Date for PT Re-Evaluation 07/11/21    Authorization Type BCBS    Authorization Time Period 06/16/21-07/12/21    Progress Note Due on Visit 50    PT Start Time 0905    PT Stop Time 0945    PT Time Calculation (min) 40 min    Equipment Utilized During Treatment Gait belt    Activity Tolerance Patient tolerated treatment well    Behavior During Therapy WFL for tasks assessed/performed             Past Medical History:  Diagnosis Date   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[place])    Chronic hepatitis B ([place])    Fixed pupils    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                                 PT Short Term Goals - 05/23/21 1548       PT SHORT TERM GOAL #1   Title Patient to demo I in initial HEP    Baseline TBD; 04/17/21 Instructed in walking program with new AFO    Time 4    Period Weeks    Status Achieved    Target Date 05/09/21      PT SHORT TERM GOAL #2   Title Patient to perform 5x  STS in 12 s or less    Baseline 15.9s; 05/09/21 12.9s; 05/16/21 5x STS 17.2s; 05/23/21 9.72s    Time 4    Period Weeks    Status Achieved    Target Date 05/09/21      PT SHORT TERM GOAL #3   Title Assess gait velocity and set goal    Baseline TBD; 04/22/21 Gait velocity 0.49 m/s, goal is 0.6 m/s    Time 4    Period Weeks    Status Achieved    Target Date 05/09/21      PT SHORT TERM GOAL #4   Title Assess DGI and set Goal    Baseline TBD; 04/17/21 DGI score 18/24, goal is 20; 05/23/21 DGI score 18    Time 4    Period Weeks    Status Achieved    Target Date 05/09/21               PT Long Term Goals - 05/23/21 1550  PT LONG TERM GOAL #1   Title The patient will demonstrate initiation and completion of HEP with supervision from caregiver at home    Baseline TBD; [id number]; 10/28/2 patient reluctant to perform HEP consistently    Time 8    Period Weeks    Status On-going      PT LONG TERM GOAL #2   Title Patient to regain 5d R DF AROM    Baseline 0d AROM R DF; 05/09/21 4d AROM DF; 05/16/21 8d AROM, 14d PROM R DF    Time 8    Period Weeks    Status Achieved      PT LONG TERM GOAL #3   Title The patient will ambulate 500' outdoors mod I over varying surfaces and surface heights including grass, curbs, and inclines to demonstrate improved functional mobility with community ambulation.    Baseline 161f in clinic with CGA; 05/16/21 UTA due to time constraints    Time 8    Period Weeks    Status On-going      PT LONG TERM GOAL #4   Title Assess DGI goal; 05/09/21 Goal is 20    Baseline TBD; DGI 17; 05/16/21 UTA due to time constarints; 05/23/21 DGI score 18    Time 8    Period Weeks    Status On-going      PT LONG TERM GOAL #5   Title Decrease TUG score to 12s    Baseline 15.9s initially; 05/16/21 17.2s; 05/23/21 TUG 12.0s    Time 8    Period Weeks    Status Partially Met      PT LONG TERM GOAL #6   Title Increase R DF, Knee extension, hip flexion and  abduction to 4/5    Baseline 3+/5 throughout; 05/16/21 4/5 R DF, 3+/5 r hip flexion, 4-/5 R knee ext.    Time 8    Period Weeks    Status On-going           Pt seen for aquatic therapy today.  Treatment took place in water 3.25-4.8 ft [facility] Temp of water was 91.  Pt entered/exited the pool via stairs step to pattern cga with bilat rail.   Warm up: water walking unsupported 6 lengths forward, backward and side stepping. Pt complete with distant supervision -VC for  straight path, balance, and cadence     Step ups completed submerged to chest, leading with each LE onto water step x 10 reps without ue support, cues to reach for wall if needed to stabilize.   Balance/proprioception/coordination Using water balloon: pt coordinates kicking/batting R/L ue and LE and following submerged balloon through water, walking in all directions, SLS, reaching and grabbing balloon.  Pt with some LOB but recovers indep.   Supine suspension supported by squoodle, cervical buoy, buoy belt and therapist: knees to chest 4 x 10 reps lower abd/core strengthening -kicking and scissoring multiple lengths of pool - Pushing off wall bilateral then R and L multiple trials/reps. Decreased assistance needed with added belt. Cues for rle coordination improving activation.   Pt requires buoyancy for support and to offload joints with strengthening exercises. Viscosity of the water is needed for resistance of strengthening; water current perturbations provides challenge to standing balance unsupported, requiring increased core activation.         Plan - 05/26/21 1607     Clinical Impression Statement Pt initiated treatment getting into pool indep. Able to walk forward and backward length of pool  with improved control, able to increase and decrease cadence as called out.  Worked more on le coordination and proprioception. Aerobic capacity challenges with 10 minutes of continuous  kicking and pushing off wall. Improved musle activation of RLE.    Stability/Clinical Decision Making Stable/Uncomplicated    Rehab Potential Good    PT Frequency 2x / week    PT Duration 8 weeks    PT Treatment/Interventions ADLs/Self Care Home Management;Aquatic Therapy;DME Instruction;Gait training;Stair training;Functional mobility training;Therapeutic activities;Therapeutic exercise;Balance training;Neuromuscular re-education;Patient/family education;Orthotic Fit/Training;Manual techniques    PT Next Visit Plan continue stepping tasks for R ankle strength and proprioception, R hip strengthening,  R hip stability drills    PT Home Exercise Plan [id number]             Patient will benefit from skilled therapeutic intervention in order to improve the following deficits and impairments:  Abnormal gait, Decreased endurance, Decreased activity tolerance, Decreased strength, Decreased balance, Decreased mobility, Difficulty walking, Increased muscle spasms, Decreased range of motion, Decreased coordination  Visit Diagnosis: Abnormal posture  Other abnormalities of gait and mobility  Unsteadiness on feet  Chronic pain of right knee  Difficulty in walking, not elsewhere classified  Muscle weakness (generalized)     Problem List Patient Active Problem List   Diagnosis Date Noted   SIRS due to infectious process with acute organ dysfunction ([place]) 03/14/2021   SIRS (systemic inflammatory response syndrome) ([place]) 03/12/2021   Syncope 03/12/2021   Traumatic brain injury with persistent deficit 03/12/2021   Hypokalemia 03/12/2021   Lactic acidosis 03/12/2021   Mixed hyperlipidemia 04/25/2018   Seizure disorder ([place]) 04/11/2018   Mechanical low back pain 05/12/2017   Dysarthria 12/20/2015   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/20/2012   Deafness, sensorineural 04/20/2012   Buzzing in ear 04/20/2012   Leaking percutaneous endoscopic gastrostomy (PEG) tube ([place]) 02/04/2012   Diffuse  traumatic brain injury with loss of consciousness greater than 24 hours with return to pre-existing conscious levels, sequela ([place]) 10/12/2011   Cerebral infarct ([place]) 10/12/2011   Spastic hemiplegia of right dominant side due to noncerebrovascular etiology ([place]) 10/12/2011   Dysphagia 10/12/2011   Functioning G-tube 03/24/2011   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

## 2021-05-28 ENCOUNTER — Ambulatory Visit: Payer: BC Managed Care – PPO | Admitting: Physical Medicine & Rehabilitation

## 2021-05-30 ENCOUNTER — Other Ambulatory Visit: Payer: Self-pay

## 2021-05-30 ENCOUNTER — Ambulatory Visit: Payer: BC Managed Care – PPO

## 2021-05-30 DIAGNOSIS — R2681 Unsteadiness on feet: Secondary | ICD-10-CM

## 2021-05-30 DIAGNOSIS — R293 Abnormal posture: Secondary | ICD-10-CM

## 2021-05-30 DIAGNOSIS — R2689 Other abnormalities of gait and mobility: Secondary | ICD-10-CM

## 2021-05-30 DIAGNOSIS — R262 Difficulty in walking, not elsewhere classified: Secondary | ICD-10-CM

## 2021-05-30 NOTE — Therapy (Signed)
[facility] [address] [place], [place], [address] Phone: [phone]   Fax:  [phone]  Physical Therapy Treatment  Patient Details  Name: Paul Kane MRN: [id number] Date of Birth: Apr 21, 1986 Referring Provider (PT): Alger Simons MD   Encounter Date: 05/30/2021   PT End of Session - 05/30/21 1419     Visit Number 17    Number of Visits 29    Date for PT Re-Evaluation 07/11/21    Authorization Type BCBS    Authorization Time Period 06/16/21-07/12/21    Progress Note Due on Visit 13    PT Start Time 1320    PT Stop Time 1400    PT Time Calculation (min) 40 min    Equipment Utilized During Treatment Gait belt    Activity Tolerance Patient tolerated treatment well    Behavior During Therapy WFL for tasks assessed/performed             Past Medical History:  Diagnosis Date   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[place])    Chronic hepatitis B ([place])    Fixed pupils    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   Subjective Assessment - 05/30/21 1330     Subjective No changes to note, has f/u with [facility] in 3 weeks.  Has not mentioned R knee pain over last several sessions    Patient is accompained by: Family member    Pertinent History Hx of CP    How long can you sit comfortably? unlimited    How long can you stand comfortably? <10 min    How long can  you walk comfortably? <5 min    Pain Onset Yesterday                               [facility] Adult PT Treatment/Exercise - 05/30/21 0001       Transfers   Transfers Sit to Stand;Stand to Sit;Stand Pivot Transfers    Sit to Stand 5: Supervision    Stand to Sit 6: Modified independent (Device/Increase time);5: Supervision    Stand Pivot Transfers 5: Supervision    Number of Reps 10 reps    Comments 10 STS from airex      Ambulation/Gait   Ambulation/Gait Yes    Ambulation/Gait Assistance 4: Min guard    Ambulation Distance (Feet) 100 Feet    Assistive device None    Gait Comments supinated R foot      Lumbar Exercises: Supine   Bridge with Ball Squeeze Non-compliant;Limitations    Bridge with [employer] Limitations 30x with manual assist      Knee/Hip Exercises: Aerobic   Stepper Scifit seat 16, UEs 7, L4 x8'      Knee/Hip Exercises: Sidelying   Hip ABduction  Right    Hip ABduction Limitations 30x with PT maintaining neutral hip position to avoid activiating flexors    Clams R 30x against light manual resistance                 Balance Exercises - 05/30/21 0001       Balance Exercises: Standing   Rockerboard Anterior/posterior;UE support;Limitations    Rockerboard Limitations single UE support, rocking AP 30x with PT facilitation into DF    Marching Foam/compliant surface;Upper extremity assist 1;10 reps;Limitations    Marching Limitations 10x each LE alternating                  PT Short Term Goals - 05/23/21 1548       PT SHORT TERM GOAL #1   Title Patient to demo I in initial HEP    Baseline TBD; 04/17/21 Instructed in walking program with new AFO    Time 4    Period Weeks    Status Achieved    Target Date 05/09/21      PT SHORT TERM GOAL #2   Title Patient to perform 5x STS in 12 s or less    Baseline 15.9s; 05/09/21 12.9s; 05/16/21 5x STS 17.2s; 05/23/21 9.72s    Time 4    Period Weeks    Status Achieved    Target Date  05/09/21      PT SHORT TERM GOAL #3   Title Assess gait velocity and set goal    Baseline TBD; 04/22/21 Gait velocity 0.49 m/s, goal is 0.6 m/s    Time 4    Period Weeks    Status Achieved    Target Date 05/09/21      PT SHORT TERM GOAL #4   Title Assess DGI and set Goal    Baseline TBD; 04/17/21 DGI score 18/24, goal is 20; 05/23/21 DGI score 18    Time 4    Period Weeks    Status Achieved    Target Date 05/09/21               PT Long Term Goals - 05/23/21 1550       PT LONG TERM GOAL #1   Title The patient will demonstrate initiation and completion of HEP with supervision from caregiver at home    Baseline TBD; [id number]; 10/28/2 patient reluctant to perform HEP consistently    Time 8    Period Weeks    Status On-going      PT LONG TERM GOAL #2   Title Patient to regain 5d R DF AROM    Baseline 0d AROM R DF; 05/09/21 4d AROM DF; 05/16/21 8d AROM, 14d PROM R DF    Time 8    Period Weeks    Status Achieved      PT LONG TERM GOAL #3   Title The patient will ambulate 500' outdoors mod I over varying surfaces and surface heights including grass, curbs, and inclines to demonstrate improved functional mobility with community ambulation.    Baseline 114f in clinic with CGA; 05/16/21 UTA due to time constraints    Time 8    Period Weeks    Status On-going      PT LONG TERM GOAL #4   Title Assess DGI goal; 05/09/21 Goal is 20    Baseline TBD; DGI 17; 05/16/21 UTA due to time constarints; 05/23/21 DGI score 18    Time 8    Period Weeks    Status On-going  PT LONG TERM GOAL #5   Title Decrease TUG score to 12s    Baseline 15.9s initially; 05/16/21 17.2s; 05/23/21 TUG 12.0s    Time 8    Period Weeks    Status Partially Met      PT LONG TERM GOAL #6   Title Increase R DF, Knee extension, hip flexion and abduction to 4/5    Baseline 3+/5 throughout; 05/16/21 4/5 R DF, 3+/5 r hip flexion, 4-/5 R knee ext.    Time 8    Period Weeks    Status On-going                    Plan - 05/30/21 1420     Clinical Impression Statement Todays session focused on R hip strengthening and stabilization techniques.  Manual resistance to sidelie clams and abduction with extension bias to provide resistance and assess strength which is increasing.  Incorporated compliant surfaces into STS and marching tasks.  Assisted with bridgingwith ball squeeze.  Good carryover into gait pattern with no R toe scuff observed today    Personal Factors and Comorbidities Comorbidity 1    Stability/Clinical Decision Making Stable/Uncomplicated    Rehab Potential Good    PT Frequency 2x / week    PT Duration 8 weeks    PT Treatment/Interventions ADLs/Self Care Home Management;Aquatic Therapy;DME Instruction;Gait training;Stair training;Functional mobility training;Therapeutic activities;Therapeutic exercise;Balance training;Neuromuscular re-education;Patient/family education;Orthotic Fit/Training;Manual techniques    PT Next Visit Plan continue stepping tasks for R ankle strength and proprioception, R hip strengthening,  R hip stability drills, compliant surfaces SLS    PT Home Exercise Plan [id number]             Patient will benefit from skilled therapeutic intervention in order to improve the following deficits and impairments:  Abnormal gait, Decreased endurance, Decreased activity tolerance, Decreased strength, Decreased balance, Decreased mobility, Difficulty walking, Increased muscle spasms, Decreased range of motion, Decreased coordination  Visit Diagnosis: Abnormal posture  Other abnormalities of gait and mobility  Unsteadiness on feet  Difficulty in walking, not elsewhere classified     Problem List Patient Active Problem List   Diagnosis Date Noted   SIRS due to infectious process with acute organ dysfunction ([facility]) 03/14/2021   SIRS (systemic inflammatory response syndrome) ([facility]) 03/12/2021   Syncope 03/12/2021   Traumatic brain injury with persistent  deficit 03/12/2021   Hypokalemia 03/12/2021   Lactic acidosis 03/12/2021   Mixed hyperlipidemia 04/25/2018   Seizure disorder ([place]) 04/11/2018   Mechanical low back pain 05/12/2017   Dysarthria 12/20/2015   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/20/2012   Deafness, sensorineural 04/20/2012   Buzzing in ear 04/20/2012   Leaking percutaneous endoscopic gastrostomy (PEG) tube ([place]) 02/04/2012   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 pre-existing conscious levels, sequela ([place]) 10/12/2011   Cerebral infarct ([place]) 10/12/2011   Spastic hemiplegia of right dominant side due to noncerebrovascular etiology ([place]) 10/12/2011   Dysphagia 10/12/2011   Functioning G-tube 03/24/2011   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   Hemiplegia of dominant side ([place]) 07/28/2010   CEREBRAL HEMORRHAGE 07/28/2010   PERSONAL HISTORY OF TRAUMATIC BRAIN INJURY 07/02/2010   ALLERGIC RHINITIS CAUSE UNSPECIFIED 10/25/2008   GASTROENTERITIS 08/09/2008   CONTACT DERMATITIS&OTHER ECZEMA DUE DETERGENTS 08/18/2007   HEPATITIS B, CHRONIC 06/28/2007   Chronic viral hepatitis B without delta-agent ([place]) 06/28/2007    Lanice Shirts, PT 05/30/2021, 2:48 PM  [facility] [address]  [place], [place], [address] Phone: [phone]   Fax:  [phone]  Name: COSMO TETREAULT MRN: [id number] Date of Birth: August 30, 1985

## 2021-06-02 ENCOUNTER — Ambulatory Visit: Payer: BC Managed Care – PPO

## 2021-06-02 ENCOUNTER — Other Ambulatory Visit: Payer: Self-pay

## 2021-06-02 DIAGNOSIS — R2689 Other abnormalities of gait and mobility: Secondary | ICD-10-CM

## 2021-06-02 DIAGNOSIS — R2681 Unsteadiness on feet: Secondary | ICD-10-CM

## 2021-06-02 DIAGNOSIS — M6281 Muscle weakness (generalized): Secondary | ICD-10-CM

## 2021-06-02 DIAGNOSIS — R262 Difficulty in walking, not elsewhere classified: Secondary | ICD-10-CM

## 2021-06-02 NOTE — Therapy (Signed)
[facility] [address] [place], [place], [address] Phone: [phone]   Fax:  [phone]  Physical Therapy Treatment  Patient Details  Name: Paul Kane MRN: [id number] Date of Birth: Apr 09, 1986 Referring Provider (PT): Alger Simons MD   Encounter Date: 06/02/2021   PT End of Session - 06/02/21 1430     Visit Number 18    Number of Visits 29    Date for PT Re-Evaluation 07/11/21    Authorization Type BCBS    Authorization Time Period 06/16/21-07/12/21    Progress Note Due on Visit 20    PT Start Time 1320    PT Stop Time 1400    PT Time Calculation (min) 40 min    Equipment Utilized During Treatment Gait belt    Activity Tolerance Patient tolerated treatment well    Behavior During Therapy WFL for tasks assessed/performed             Past Medical History:  Diagnosis Date   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[place])    Chronic hepatitis B ([facility])    Fixed pupils    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   Subjective Assessment - 06/02/21 1324     Subjective Nothing to report.  Lost his balance on carpeting while barefoot but did not fall.    Patient is accompained by: Family member    Pertinent History Hx of CP    How long can you sit comfortably? unlimited    How long can you stand comfortably? <10 min    How long can you walk comfortably? <5 min     Pain Onset --                               [facility] Adult PT Treatment/Exercise - 06/02/21 0001       Transfers   Transfers Sit to Stand;Stand to Sit;Stand Pivot Transfers    Sit to Stand 5: Supervision    Stand to Sit 6: Modified independent (Device/Increase time);5: Supervision    Stand Pivot Transfers 5: Supervision    Number of Reps 10 reps    Comments 10 STS from airex      Ambulation/Gait   Ambulation/Gait Yes    Ambulation/Gait Assistance 4: Min guard    Ambulation Distance (Feet) 200 Feet    Assistive device None    Gait Comments supinated R foot, encouraged RLE marching to overcorrect for limited hip flexion in swing through      Knee/Hip Exercises: Aerobic   Stepper Scifit seat 16, UEs 7, L5 x8'      Knee/Hip Exercises: Supine   Other Supine Knee/Hip Exercises manually resisted hip fllouts R, 30x    Other Supine Knee/Hip Exercises manually resisted/directed flexion/ext in PNF patterns resustance at  foot to promote DF and stepping mechanics      Knee/Hip Exercises: Sidelying   Hip ABduction Right    Hip ABduction Limitations 30x with PT maintaining neutral hip position to avoid activiating flexors    Clams R 30x against light manual resistance                 Balance Exercises - 06/02/21 0001       Balance Exercises: Standing   Step Over Hurdles / Cones 3 trips in // bars, LUE support, short hurdles, 3 trips                  PT Short Term Goals - 05/23/21 1548       PT SHORT TERM GOAL #1   Title Patient to demo I in initial HEP    Baseline TBD; 04/17/21 Instructed in walking program with new AFO    Time 4    Period Weeks    Status Achieved    Target Date 05/09/21      PT SHORT TERM GOAL #2   Title Patient to perform 5x STS in 12 s or less    Baseline 15.9s; 05/09/21 12.9s; 05/16/21 5x STS 17.2s; 05/23/21 9.72s    Time 4    Period Weeks    Status Achieved    Target Date 05/09/21      PT SHORT TERM GOAL #3   Title  Assess gait velocity and set goal    Baseline TBD; 04/22/21 Gait velocity 0.49 m/s, goal is 0.6 m/s    Time 4    Period Weeks    Status Achieved    Target Date 05/09/21      PT SHORT TERM GOAL #4   Title Assess DGI and set Goal    Baseline TBD; 04/17/21 DGI score 18/24, goal is 20; 05/23/21 DGI score 18    Time 4    Period Weeks    Status Achieved    Target Date 05/09/21               PT Long Term Goals - 05/23/21 1550       PT LONG TERM GOAL #1   Title The patient will demonstrate initiation and completion of HEP with supervision from caregiver at home    Baseline TBD; [id number]; 10/28/2 patient reluctant to perform HEP consistently    Time 8    Period Weeks    Status On-going      PT LONG TERM GOAL #2   Title Patient to regain 5d R DF AROM    Baseline 0d AROM R DF; 05/09/21 4d AROM DF; 05/16/21 8d AROM, 14d PROM R DF    Time 8    Period Weeks    Status Achieved      PT LONG TERM GOAL #3   Title The patient will ambulate 500' outdoors mod I over varying surfaces and surface heights including grass, curbs, and inclines to demonstrate improved functional mobility with community ambulation.    Baseline 165f in clinic with CGA; 05/16/21 UTA due to time constraints    Time 8    Period Weeks    Status On-going      PT LONG TERM GOAL #4   Title Assess DGI goal; 05/09/21 Goal is 20    Baseline TBD; DGI 17; 05/16/21 UTA due to time constarints; 05/23/21 DGI score 18    Time 8    Period Weeks    Status On-going  PT LONG TERM GOAL #5   Title Decrease TUG score to 12s    Baseline 15.9s initially; 05/16/21 17.2s; 05/23/21 TUG 12.0s    Time 8    Period Weeks    Status Partially Met      PT LONG TERM GOAL #6   Title Increase R DF, Knee extension, hip flexion and abduction to 4/5    Baseline 3+/5 throughout; 05/16/21 4/5 R DF, 3+/5 r hip flexion, 4-/5 R knee ext.    Time 8    Period Weeks    Status On-going                   Plan - 06/02/21 1430      Clinical Impression Statement Continued with R hip strengthening to improve control and gait pattern.  Added supine RLE PNF against manual resistance at ball an dorsum of R foot to simulate stepping pattern.  Sdvance to hurdles in // bars, 3 trips with UE support and no missed obstacles.  Gait training in gym w/o AD encouraging overcorrection of R step through patter by matching.    Personal Factors and Comorbidities Comorbidity 1    Comorbidities CP    Examination-Activity Limitations Locomotion Level    Stability/Clinical Decision Making Stable/Uncomplicated    Rehab Potential Good    PT Frequency 2x / week    PT Duration 8 weeks    PT Treatment/Interventions ADLs/Self Care Home Management;Aquatic Therapy;DME Instruction;Gait training;Stair training;Functional mobility training;Therapeutic activities;Therapeutic exercise;Balance training;Neuromuscular re-education;Patient/family education;Orthotic Fit/Training;Manual techniques    PT Next Visit Plan runners step for R hip strength/proprioception, R hip stability tasks, compliant surfaces SLS, hurdles,    PT Home Exercise Plan [id number]             Patient will benefit from skilled therapeutic intervention in order to improve the following deficits and impairments:  Abnormal gait, Decreased endurance, Decreased activity tolerance, Decreased strength, Decreased balance, Decreased mobility, Difficulty walking, Increased muscle spasms, Decreased range of motion, Decreased coordination  Visit Diagnosis: Other abnormalities of gait and mobility  Unsteadiness on feet  Difficulty in walking, not elsewhere classified  Muscle weakness (generalized)     Problem List Patient Active Problem List   Diagnosis Date Noted   SIRS due to infectious process with acute organ dysfunction ([place]) 03/14/2021   SIRS (systemic inflammatory response syndrome) ([place]) 03/12/2021   Syncope 03/12/2021   Traumatic brain injury with persistent deficit 03/12/2021    Hypokalemia 03/12/2021   Lactic acidosis 03/12/2021   Mixed hyperlipidemia 04/25/2018   Seizure disorder ([place]) 04/11/2018   Mechanical low back pain 05/12/2017   Dysarthria 12/20/2015   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/20/2012   Deafness, sensorineural 04/20/2012   Buzzing in ear 04/20/2012   Leaking percutaneous endoscopic gastrostomy (PEG) tube ([place]) 02/04/2012   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 pre-existing conscious levels, sequela ([place]) 10/12/2011   Cerebral infarct ([place]) 10/12/2011   Spastic hemiplegia of right dominant side due to noncerebrovascular etiology ([place]) 10/12/2011   Dysphagia 10/12/2011   Functioning G-tube 03/24/2011   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   Hemiplegia of dominant side ([place]) 07/28/2010   CEREBRAL HEMORRHAGE 07/28/2010   PERSONAL HISTORY OF TRAUMATIC BRAIN INJURY 07/02/2010   ALLERGIC RHINITIS CAUSE UNSPECIFIED 10/25/2008   GASTROENTERITIS 08/09/2008   CONTACT DERMATITIS&OTHER ECZEMA DUE DETERGENTS 08/18/2007   HEPATITIS B, CHRONIC 06/28/2007   Chronic viral hepatitis B without delta-agent ([place]) 06/28/2007    Lanice Shirts, PT 06/02/2021, 2:41 PM  [facility] [address] [place], [place], [address] Phone: [phone]   Fax:  [phone]  Name: Paul Kane MRN: [id number] Date of Birth: 04-06-1986

## 2021-06-04 ENCOUNTER — Encounter: Payer: Self-pay | Admitting: Physical Medicine & Rehabilitation

## 2021-06-04 ENCOUNTER — Encounter
Payer: BC Managed Care – PPO | Attending: Physical Medicine & Rehabilitation | Admitting: Physical Medicine & Rehabilitation

## 2021-06-04 ENCOUNTER — Other Ambulatory Visit: Payer: Self-pay

## 2021-06-04 VITALS — BP 107/70 | HR 66 | Temp 97.7°F | Ht 66.0 in | Wt 160.4 lb

## 2021-06-04 DIAGNOSIS — S062X5S 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 pre-existing conscious levels, sequela: Secondary | ICD-10-CM

## 2021-06-04 DIAGNOSIS — G8111 Spastic hemiplegia affecting right dominant side: Secondary | ICD-10-CM

## 2021-06-04 NOTE — Progress Notes (Signed)
Subjective:    Patient ID: Paul Kane, male    DOB: December 27, 1985, 35 y.o.   MRN: [id number]  HPI  Paul Kane is here in follow up his spastic right hemiparesis and associated gait disorder.  He has been involved in some therapy both in land and aquatic to work on his gait.  He continues have some pain in his right knee due to hyperextension of the right knee.  He is in the process of receiving the new brace we discussed at last visit.  Father brought with him a hyperextension brace today which they had used in the past.  Patient reports pain after he walks longer distances or when he works out.  Father reports that he can frequently lift too much weight which can cause him problems and pain the following day.  Otherwise he is doing well without any new problems or issues..    Pain Inventory Average Pain 3 Pain Right Now 3 My pain is intermittent  LOCATION OF PAIN  RIGHT KNEE  BOWEL Number of stools per week: 3  BLADDER Normal   Mobility walk without assistance how many minutes can you walk? 15 ability to climb steps?  yes do you drive?  no transfers alone Do you have any goals in this area?  yes  Function I need assistance with the following:  feeding, bathing, meal prep, household duties, and shopping Do you have any goals in this area?  yes  Neuro/Psych weakness  Prior Studies Any changes since last visit?  no  Physicians involved in your care Any changes since last visit?  no   Family History  Problem Relation Age of Onset   Diabetes Other    Hyperlipidemia Other    Hypertension Other    Social History   Socioeconomic History   Marital status: Single    Spouse name: Not on file   Number of children: Not on file   Years of education: Not on file   Highest education level: Not on file  Occupational History   Not on file  Tobacco Use   Smoking status: Never   Smokeless tobacco: Never  Vaping Use   Vaping Use: Never used  Substance and Sexual Activity    Alcohol use: No   Drug use: No   Sexual activity: Never  Other Topics Concern   Not on file  Social History Narrative   Not on file   Social Determinants of Health   Financial Resource Strain: Not on file  Food Insecurity: Not on file  Transportation Needs: Not on file  Physical Activity: Not on file  Stress: Not on file  Social Connections: Not on file   Past Surgical History:  Procedure Laterality Date   cramiectomy     CRANIOPLASTY     REMOVAL OF GASTROSTOMY TUBE     VENTRICULOPERITONEAL SHUNT     Past Medical History:  Diagnosis Date   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[facility])    Chronic hepatitis B (HCC)    Fixed pupils    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HCC)    Hypertension    Intracranial injury of other and unspecified nature, without mention of open intracranial wound, unspecified state of consciousness    Intracranial shunt    Paralysis (HCC)    Spastic hemiplegia affecting dominant side (H[facility])    Stroke (HCC)    Traumatic brain injury    Trouble swallowing    Visual disturbance    Weakness    BP 107/70  Pulse 66   Temp 97.7 F (36.5 C)   Ht 5\' 6"  (1.676 m)   Wt 160 lb 6.4 oz (72.8 kg)   SpO2 99%   BMI 25.89 kg/m   Opioid Risk Score:   Fall Risk Score:  `1  Depression screen PHQ 2/9  Depression screen [facility] 2/9 03/19/2021 10/02/2020 06/05/2020 04/03/2020 01/03/2020 11/29/2017 03/16/2017  Decreased Interest 1 0 0 0 0 0 1  Down, Depressed, Hopeless 1 0 0 0 0 0 1  PHQ - 2 Score 2 0 0 0 0 0 2  Altered sleeping - - - - 0 - -  Tired, decreased energy - - - - 0 - -  Change in appetite - - - - 0 - -  Feeling bad or failure about yourself  - - - - 0 - -  Trouble concentrating - - - - 0 - -  Moving slowly or fidgety/restless - - - - 0 - -  Suicidal thoughts - - - - 0 - -  PHQ-9 Score - - - - 0 - -  Some recent data might be hidden    Review of Systems  Musculoskeletal:  Positive for gait problem.  All other systems reviewed and are negative.      Objective:   Physical Exam  General: No acute distress HEENT: NCAT, EOMI, oral membranes moist Cards: reg rate  Chest: normal effort Abdomen: Soft, NT, ND Skin: dry, intact Extremities: no edema Psych: pleasant and appropriate  Skin: intact Neuro: Patient is alert and appropriate.  Speech is dysarthric.  Spastic right hemiparesis.  Continued equinovarus deformity right ankle.  Right knee hyperextends as well in stance even with KI. There is a mild effusion at the right lateral ankle.  He is somewhat tender with weightbearing.  Spasticity is generally controlled although he is hyperreflexic on the right. Musculoskeletal: no right knee effusion, no pain with AROM/PROM. No ligamentous laxity.            Assessment & Plan:  ASSESSMENT:   1. History of traumatic brain injury with hydrocephalus, meningitis,   and right pontine stroke.   2. Spastic tetraplegia, dominant side more affected 3. severe oropharyngeal dysphagia--improved--tolerating diet well.  Continues on thickened liquids 4. Absence seizures 5. Mechanical Low back pain: due to posture/altered gait patterns       PLAN:   1.  Right AFO delivery/fitting this week. Will ask [facility] to assess for knee hyperextension brace. Order written for knee brace.  We also discussed the fact that he needs to be realistic about his activities.  He should not be over lifting weights.  He needs to reduce repetitions at times as well and really focus on mechanics.  Hopefully another knee brace will provide better knee control in stance. 2.  Spasticity has improved after Dysport injections.  Probably could use more to his biceps and shoulder.  We will discuss at his follow-up in the spring 3.  Vimpat and keppra for seizure  4. tylenol or voltaren for pain     15 minutes of face to face patient care time were spent during this visit. All questions were encouraged and answered.  Follow up with me in 4 mos .

## 2021-06-04 NOTE — Patient Instructions (Signed)
PLEASE FEEL FREE TO CALL OUR OFFICE WITH ANY PROBLEMS OR QUESTIONS ([phone])      

## 2021-06-06 ENCOUNTER — Ambulatory Visit (HOSPITAL_BASED_OUTPATIENT_CLINIC_OR_DEPARTMENT_OTHER): Payer: BC Managed Care – PPO | Admitting: Physical Therapy

## 2021-06-06 ENCOUNTER — Encounter (HOSPITAL_BASED_OUTPATIENT_CLINIC_OR_DEPARTMENT_OTHER): Payer: Self-pay | Admitting: Physical Therapy

## 2021-06-06 ENCOUNTER — Other Ambulatory Visit: Payer: Self-pay

## 2021-06-06 DIAGNOSIS — R293 Abnormal posture: Secondary | ICD-10-CM

## 2021-06-06 DIAGNOSIS — R262 Difficulty in walking, not elsewhere classified: Secondary | ICD-10-CM

## 2021-06-06 DIAGNOSIS — M6281 Muscle weakness (generalized): Secondary | ICD-10-CM

## 2021-06-06 DIAGNOSIS — G8929 Other chronic pain: Secondary | ICD-10-CM

## 2021-06-06 DIAGNOSIS — R2681 Unsteadiness on feet: Secondary | ICD-10-CM

## 2021-06-06 DIAGNOSIS — R2689 Other abnormalities of gait and mobility: Secondary | ICD-10-CM

## 2021-06-06 NOTE — Therapy (Signed)
[place], [place], [address] Phone: [phone]   Fax:  [phone]  Physical Therapy Treatment  Patient Details  Name: Paul Kane MRN: [id number] Date of Birth: [birth year] Referring Provider (PT): Alger Simons MD   Encounter Date: 06/06/2021   PT End of Session - 06/06/21 0910     Visit Number 19    Number of Visits 29    Date for PT Re-Evaluation 07/11/21    Authorization Type BCBS    Authorization Time Period 06/16/21-07/12/21    Progress Note Due on Visit 29    PT Start Time 0907    PT Stop Time 0945    PT Time Calculation (min) 38 min    Equipment Utilized During Treatment Gait belt    Activity Tolerance Patient tolerated treatment well    Behavior During Therapy WFL for tasks assessed/performed             Past Medical History:  Diagnosis Date   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[place])    Chronic hepatitis B ([place])    Fixed pupils    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   Subjective Assessment - 06/06/21 1455     Subjective No falls                                          PT Short Term Goals - 05/23/21 1548       PT SHORT TERM GOAL #1   Title Patient to demo I in initial HEP    Baseline TBD; 04/17/21 Instructed in walking program with new AFO    Time 4    Period Weeks    Status Achieved     Target Date 05/09/21      PT SHORT TERM GOAL #2   Title Patient to perform 5x STS in 12 s or less    Baseline 15.9s; 05/09/21 12.9s; 05/16/21 5x STS 17.2s; 05/23/21 9.72s    Time 4    Period Weeks    Status Achieved    Target Date 05/09/21      PT SHORT TERM GOAL #3   Title Assess gait velocity and set goal    Baseline TBD; 04/22/21 Gait velocity 0.49 m/s, goal is 0.6 m/s    Time 4    Period Weeks    Status Achieved    Target Date 05/09/21      PT SHORT TERM GOAL #4   Title Assess DGI and set Goal    Baseline TBD; 04/17/21 DGI score 18/24, goal is 20; 05/23/21 DGI score 18    Time 4    Period Weeks    Status Achieved    Target Date 05/09/21  PT Long Term Goals - 05/23/21 1550       PT LONG TERM GOAL #1   Title The patient will demonstrate initiation and completion of HEP with supervision from caregiver at home    Baseline TBD; [id number]; 10/28/2 patient reluctant to perform HEP consistently    Time 8    Period Weeks    Status On-going      PT LONG TERM GOAL #2   Title Patient to regain 5d R DF AROM    Baseline 0d AROM R DF; 05/09/21 4d AROM DF; 05/16/21 8d AROM, 14d PROM R DF    Time 8    Period Weeks    Status Achieved      PT LONG TERM GOAL #3   Title The patient will ambulate 500' outdoors mod I over varying surfaces and surface heights including grass, curbs, and inclines to demonstrate improved functional mobility with community ambulation.    Baseline 129f in clinic with CGA; 05/16/21 UTA due to time constraints    Time 8    Period Weeks    Status On-going      PT LONG TERM GOAL #4   Title Assess DGI goal; 05/09/21 Goal is 20    Baseline TBD; DGI 17; 05/16/21 UTA due to time constarints; 05/23/21 DGI score 18    Time 8    Period Weeks    Status On-going      PT LONG TERM GOAL #5   Title Decrease TUG score to 12s    Baseline 15.9s initially; 05/16/21 17.2s; 05/23/21 TUG 12.0s    Time 8    Period Weeks    Status Partially Met      PT  LONG TERM GOAL #6   Title Increase R DF, Knee extension, hip flexion and abduction to 4/5    Baseline 3+/5 throughout; 05/16/21 4/5 R DF, 3+/5 r hip flexion, 4-/5 R knee ext.    Time 8    Period Weeks    Status On-going           Pt seen for aquatic therapy today.  Treatment took place in water 3.25-4.8 ft [facility] Temp of water was 91.  Pt entered/exited the pool via stairs step to pattern cga with bilat rail.   Warm up: water walking unsupported 6 lengths forward, backward and side stepping. Pt complete with distant supervision -VC for  straight path, balance, and cadence  Seated stretching Flutter (at hips) 5 trials x 1 min  Flutter (at knees) 5 x 1 min  Standing (proprioception and balance) 1 point step forward then return, then back/return 2 point step forward then back/return 3 point step forward side then back/return Pt holding to wall. Given constant vc and demonstration for time on task and execution Squats 2 x 10 reps on bottom water step    Pt requires buoyancy for support and to offload joints with strengthening exercises. Viscosity of the water is needed for resistance of strengthening; water current perturbations provides challenge to standing balance unsupported, requiring increased core activation.        Plan - 06/06/21 1501     Clinical Impression Statement Aerobic capacity training with seated kicking with focus on hip flex/ext then knee flex/ext. Cues for increase right LE movement.  1,2 and 3 point stepping for proprioception. Cues to focus on decrease overstepping of RLE. Pt with little success although improves with repitition.    Stability/Clinical Decision Making Stable/Uncomplicated    Rehab  Potential Good    PT Frequency 2x / week    PT Duration 8 weeks    PT Treatment/Interventions ADLs/Self Care Home Management;Aquatic Therapy;DME Instruction;Gait training;Stair training;Functional mobility training;Therapeutic  activities;Therapeutic exercise;Balance training;Neuromuscular re-education;Patient/family education;Orthotic Fit/Training;Manual techniques    PT Next Visit Plan aerobic capacity/proprioception             Patient will benefit from skilled therapeutic intervention in order to improve the following deficits and impairments:  Abnormal gait, Decreased endurance, Decreased activity tolerance, Decreased strength, Decreased balance, Decreased mobility, Difficulty walking, Increased muscle spasms, Decreased range of motion, Decreased coordination  Visit Diagnosis: Abnormal posture  Chronic pain of right knee  Difficulty in walking, not elsewhere classified  Muscle weakness (generalized)  Unsteadiness on feet  Other abnormalities of gait and mobility     Problem List Patient Active Problem List   Diagnosis Date Noted   SIRS due to infectious process with acute organ dysfunction ([place]) 03/14/2021   SIRS (systemic inflammatory response syndrome) ([place]) 03/12/2021   Syncope 03/12/2021   Traumatic brain injury with persistent deficit 03/12/2021   Hypokalemia 03/12/2021   Lactic acidosis 03/12/2021   Mixed hyperlipidemia 04/25/2018   Seizure disorder ([place]) 04/11/2018   Mechanical low back pain 05/12/2017   Dysarthria 12/20/2015   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/20/2012   Deafness, sensorineural 04/20/2012   Buzzing in ear 04/20/2012   Leaking percutaneous endoscopic gastrostomy (PEG) tube ([place]) 02/04/2012   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 pre-existing conscious levels, sequela ([place]) 10/12/2011   Cerebral infarct ([place]) 10/12/2011   Spastic hemiplegia of right dominant side due to noncerebrovascular etiology ([place]) 10/12/2011   Dysphagia 10/12/2011   Functioning G-tube 03/24/2011   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   Hemiplegia of dominant side ([place]) 07/28/2010   CEREBRAL HEMORRHAGE 07/28/2010   PERSONAL HISTORY OF  TRAUMATIC BRAIN INJURY 07/02/2010   ALLERGIC RHINITIS CAUSE UNSPECIFIED 10/25/2008   GASTROENTERITIS 08/09/2008   CONTACT DERMATITIS&OTHER ECZEMA DUE DETERGENTS 08/18/2007   HEPATITIS B, CHRONIC 06/28/2007   Chronic viral hepatitis B without delta-agent ([place]) 06/28/2007    Annamarie Major) Milisa Kimbell MPT 06/06/2021, 3:05 PM  [facility] [address] [place], [place], [address] Phone: [phone]   Fax:  [phone]  Name: Paul Kane MRN: [id number] Date of Birth: Jul 29, 1985

## 2021-06-10 ENCOUNTER — Other Ambulatory Visit: Payer: Self-pay

## 2021-06-10 ENCOUNTER — Ambulatory Visit (HOSPITAL_BASED_OUTPATIENT_CLINIC_OR_DEPARTMENT_OTHER): Payer: BC Managed Care – PPO | Admitting: Physical Therapy

## 2021-06-10 ENCOUNTER — Encounter (HOSPITAL_BASED_OUTPATIENT_CLINIC_OR_DEPARTMENT_OTHER): Payer: Self-pay | Admitting: Physical Therapy

## 2021-06-10 DIAGNOSIS — R262 Difficulty in walking, not elsewhere classified: Secondary | ICD-10-CM

## 2021-06-10 DIAGNOSIS — M6281 Muscle weakness (generalized): Secondary | ICD-10-CM

## 2021-06-10 DIAGNOSIS — R293 Abnormal posture: Secondary | ICD-10-CM | POA: Diagnosis not present

## 2021-06-10 DIAGNOSIS — G8929 Other chronic pain: Secondary | ICD-10-CM

## 2021-06-10 DIAGNOSIS — R2681 Unsteadiness on feet: Secondary | ICD-10-CM

## 2021-06-10 DIAGNOSIS — R2689 Other abnormalities of gait and mobility: Secondary | ICD-10-CM

## 2021-06-10 NOTE — Therapy (Signed)
[place], [place], [address] Phone: [phone]   Fax:  [phone]  Physical Therapy Treatment  Patient Details  Name: Paul Kane MRN: [id number] Date of Birth: November 14, 1985 Referring Provider (PT): Alger Simons MD   Encounter Date: 06/10/2021   PT End of Session - 06/10/21 1836     Visit Number 20    Number of Visits 29    Date for PT Re-Evaluation 07/11/21    Authorization Type BCBS    Authorization Time Period 06/16/21-07/12/21    PT Start Time 1701    PT Stop Time 1745    PT Time Calculation (min) 44 min    Equipment Utilized During Treatment Gait belt    Activity Tolerance Patient tolerated treatment well    Behavior During Therapy WFL for tasks assessed/performed             Past Medical History:  Diagnosis Date   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[place])    Chronic hepatitis B ([place])    Fixed pupils    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   Subjective Assessment - 06/10/21 1835     Subjective No questions or concerns.    Currently in Pain? No/denies                                          PT Short Term Goals - 05/23/21 1548       PT SHORT TERM GOAL #1   Title Patient to demo I in initial HEP    Baseline TBD; 04/17/21 Instructed in walking program with new AFO    Time 4    Period Weeks     Status Achieved    Target Date 05/09/21      PT SHORT TERM GOAL #2   Title Patient to perform 5x STS in 12 s or less    Baseline 15.9s; 05/09/21 12.9s; 05/16/21 5x STS 17.2s; 05/23/21 9.72s    Time 4    Period Weeks    Status Achieved    Target Date 05/09/21      PT SHORT TERM GOAL #3   Title Assess gait velocity and set goal    Baseline TBD; 04/22/21 Gait velocity 0.49 m/s, goal is 0.6 m/s    Time 4    Period Weeks    Status Achieved    Target Date 05/09/21      PT SHORT TERM GOAL #4   Title Assess DGI and set Goal    Baseline TBD; 04/17/21 DGI score 18/24, goal is 20; 05/23/21 DGI score 18    Time 4    Period Weeks    Status Achieved    Target Date 05/09/21  PT Long Term Goals - 05/23/21 1550       PT LONG TERM GOAL #1   Title The patient will demonstrate initiation and completion of HEP with supervision from caregiver at home    Baseline TBD; [id number]; 10/28/2 patient reluctant to perform HEP consistently    Time 8    Period Weeks    Status On-going      PT LONG TERM GOAL #2   Title Patient to regain 5d R DF AROM    Baseline 0d AROM R DF; 05/09/21 4d AROM DF; 05/16/21 8d AROM, 14d PROM R DF    Time 8    Period Weeks    Status Achieved      PT LONG TERM GOAL #3   Title The patient will ambulate 500' outdoors mod I over varying surfaces and surface heights including grass, curbs, and inclines to demonstrate improved functional mobility with community ambulation.    Baseline 174f in clinic with CGA; 05/16/21 UTA due to time constraints    Time 8    Period Weeks    Status On-going      PT LONG TERM GOAL #4   Title Assess DGI goal; 05/09/21 Goal is 20    Baseline TBD; DGI 17; 05/16/21 UTA due to time constarints; 05/23/21 DGI score 18    Time 8    Period Weeks    Status On-going      PT LONG TERM GOAL #5   Title Decrease TUG score to 12s    Baseline 15.9s initially; 05/16/21 17.2s; 05/23/21 TUG 12.0s    Time 8    Period Weeks    Status  Partially Met      PT LONG TERM GOAL #6   Title Increase R DF, Knee extension, hip flexion and abduction to 4/5    Baseline 3+/5 throughout; 05/16/21 4/5 R DF, 3+/5 r hip flexion, 4-/5 R knee ext.    Time 8    Period Weeks    Status On-going             Pt seen for aquatic therapy today.  Treatment took place in water 3.25-4.8 ft [facility] Temp of water was 91.  Pt entered/exited the pool via stairs step to pattern cga with bilat rail.   Warm up: water walking unsupported 6 lengths forward, backward and side stepping. Pt complete with distant supervision -VC for  straight path, balance, and cadence   Seated stretching manual assist hamstring, gastroc and adductos   Bad Ragaz, Pt with lumbar belt around hips, noodles and nek doodle for neck support.  .  Pt assisted into supine floating. . PT at torso and assisting with trunk left to right and vice versa to engage trunk muscles. PT then rotated trunk in order to engage abdomnal (internal and external obliques) -knee flex x10 R/L.  Some assistance with rle -hip extension x10.  Assistance with rle for proper execution.  Supine suspension: kicking at hip, knee, add/abd. Kicking off wall. Facilitated by PT unilateral hip/knee flex then extension pushing away then pulling back    Pt requires buoyancy for support and to offload joints with strengthening exercises. Viscosity of the water is needed for resistance of strengthening; water current perturbations provides challenge to standing balance unsupported, requiring increased core activation.       Plan - 06/10/21 1836     Clinical Impression Statement Changed gears today. Bad Ragaz technique used for decreased tone and then used supine  suspension positioning for posterior chain strengthening. Good lateral core engagement with side to side movements and cues for sidebending in position resisted by perturbations. Pt able to gain good glute activation  bilaterally(well coordinated) for strengtheing of hip extension. Rle tone decreased considerably allowing for knee flex resisted by water.    Stability/Clinical Decision Making Stable/Uncomplicated    Rehab Potential Good    PT Frequency 2x / week    PT Duration 8 weeks    PT Treatment/Interventions ADLs/Self Care Home Management;Aquatic Therapy;DME Instruction;Gait training;Stair training;Functional mobility training;Therapeutic activities;Therapeutic exercise;Balance training;Neuromuscular re-education;Patient/family education;Orthotic Fit/Training;Manual techniques    PT Next Visit Plan posterior chain strengthening    PT Home Exercise Plan [id number]             Patient will benefit from skilled therapeutic intervention in order to improve the following deficits and impairments:  Abnormal gait, Decreased endurance, Decreased activity tolerance, Decreased strength, Decreased balance, Decreased mobility, Difficulty walking, Increased muscle spasms, Decreased range of motion, Decreased coordination  Visit Diagnosis: Abnormal posture  Other abnormalities of gait and mobility  Unsteadiness on feet  Chronic pain of right knee  Difficulty in walking, not elsewhere classified  Muscle weakness (generalized)     Problem List Patient Active Problem List   Diagnosis Date Noted   SIRS due to infectious process with acute organ dysfunction ([place]) 03/14/2021   SIRS (systemic inflammatory response syndrome) ([place]) 03/12/2021   Syncope 03/12/2021   Traumatic brain injury with persistent deficit 03/12/2021   Hypokalemia 03/12/2021   Lactic acidosis 03/12/2021   Mixed hyperlipidemia 04/25/2018   Seizure disorder ([place]) 04/11/2018   Mechanical low back pain 05/12/2017   Dysarthria 12/20/2015   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/20/2012   Deafness, sensorineural 04/20/2012   Buzzing in ear 04/20/2012   Leaking percutaneous endoscopic gastrostomy (PEG) tube ([place]) 02/04/2012   Diffuse traumatic  brain injury with loss of consciousness greater than 24 hours with return to pre-existing conscious levels, sequela ([place]) 10/12/2011   Cerebral infarct ([place]) 10/12/2011   Spastic hemiplegia of right dominant side due to noncerebrovascular etiology ([place]) 10/12/2011   Dysphagia 10/12/2011   Functioning G-tube 03/24/2011   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

## 2021-06-11 ENCOUNTER — Ambulatory Visit: Payer: BC Managed Care – PPO

## 2021-06-11 DIAGNOSIS — R2689 Other abnormalities of gait and mobility: Secondary | ICD-10-CM

## 2021-06-11 DIAGNOSIS — R2681 Unsteadiness on feet: Secondary | ICD-10-CM

## 2021-06-11 DIAGNOSIS — R293 Abnormal posture: Secondary | ICD-10-CM

## 2021-06-11 DIAGNOSIS — R262 Difficulty in walking, not elsewhere classified: Secondary | ICD-10-CM

## 2021-06-11 NOTE — Therapy (Signed)
[facility] [address] [place], [place], [address] Phone: [phone]   Fax:  [phone]  Physical Therapy Treatment  Patient Details  Name: Paul Kane MRN: [id number] Date of Birth: 15-Aug-1985 Referring Provider (PT): Alger Simons MD   Encounter Date: 06/11/2021   PT End of Session - 06/11/21 1620     Visit Number 21    Number of Visits 29    Date for PT Re-Evaluation 07/11/21    Authorization Type BCBS    Authorization Time Period 06/16/21-07/12/21    Progress Note Due on Visit 20    PT Start Time 1620    PT Stop Time 1700    PT Time Calculation (min) 40 min    Equipment Utilized During Treatment Gait belt    Activity Tolerance Patient tolerated treatment well    Behavior During Therapy WFL for tasks assessed/performed             Past Medical History:  Diagnosis Date   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[place])    Chronic hepatitis B ([place])    Fixed pupils    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   Subjective Assessment - 06/11/21 1619     Subjective No falls, will f/u on AFOs 06/18/21    Patient is accompained by: Family member    Pertinent History Hx of CP    How long can you sit comfortably? unlimited    How long can you stand comfortably? <10 min    How long can you walk comfortably? <5 min    Pain Onset Yesterday                                [facility] Adult PT Treatment/Exercise - 06/11/21 0001       Knee/Hip Exercises: Aerobic   Stepper Scifit seat 16, UEs 7, L5 x8'      Knee/Hip Exercises: Supine   Other Supine Knee/Hip Exercises manually resisted hip fallouts R, 30x    Other Supine Knee/Hip Exercises manually resisted/directed flexion/ext in PNF patterns resustance at foot to promote DF and stepping mechanics, 30x      Knee/Hip Exercises: Sidelying   Hip ABduction Right    Hip ABduction Limitations 30x with PT maintaining neutral hip position to avoid activiating flexors    Clams R 30x against light manual resistance                 Balance Exercises - 06/11/21 0001       Balance Exercises: Standing   Rockerboard Anterior/posterior;UE support;Limitations    Rockerboard Limitations Single UE support for static standing 60s fllowed by unsupported rocking fro 60s with CGA    Sidestepping Foam/compliant support;3 reps;Limitations    Sidestepping Limitations 3 trips in bars LUE  support    Step Over Hurdles / Cones 3 trips in // bars, LUE support, short hurdles, across blue mat LUE support    Other Standing Exercises runners step from 4" block stepping with RLE to promote ankle stability                  PT Short Term Goals - 05/23/21 1548       PT SHORT TERM GOAL #1   Title Patient to demo I in initial HEP    Baseline TBD; 04/17/21 Instructed in walking program with new AFO    Time 4    Period Weeks    Status Achieved    Target Date 05/09/21      PT SHORT TERM GOAL #2   Title Patient to perform 5x STS in 12 s or less    Baseline 15.9s; 05/09/21 12.9s; 05/16/21 5x STS 17.2s; 05/23/21 9.72s    Time 4    Period Weeks    Status Achieved    Target Date 05/09/21      PT SHORT TERM GOAL #3   Title Assess gait velocity and set goal    Baseline TBD; 04/22/21 Gait velocity 0.49 m/s, goal is 0.6 m/s    Time 4    Period Weeks    Status Achieved    Target Date  05/09/21      PT SHORT TERM GOAL #4   Title Assess DGI and set Goal    Baseline TBD; 04/17/21 DGI score 18/24, goal is 20; 05/23/21 DGI score 18    Time 4    Period Weeks    Status Achieved    Target Date 05/09/21               PT Long Term Goals - 05/23/21 1550       PT LONG TERM GOAL #1   Title The patient will demonstrate initiation and completion of HEP with supervision from caregiver at home    Baseline TBD; [id number]; 10/28/2 patient reluctant to perform HEP consistently    Time 8    Period Weeks    Status On-going      PT LONG TERM GOAL #2   Title Patient to regain 5d R DF AROM    Baseline 0d AROM R DF; 05/09/21 4d AROM DF; 05/16/21 8d AROM, 14d PROM R DF    Time 8    Period Weeks    Status Achieved      PT LONG TERM GOAL #3   Title The patient will ambulate 500' outdoors mod I over varying surfaces and surface heights including grass, curbs, and inclines to demonstrate improved functional mobility with community ambulation.    Baseline 159f in clinic with CGA; 05/16/21 UTA due to time constraints    Time 8    Period Weeks    Status On-going      PT LONG TERM GOAL #4   Title Assess DGI goal; 05/09/21 Goal is 20    Baseline TBD; DGI 17; 05/16/21 UTA due to time constarints; 05/23/21 DGI score 18    Time 8    Period Weeks    Status On-going      PT LONG TERM GOAL #5   Title Decrease TUG score to 12s    Baseline 15.9s initially; 05/16/21 17.2s; 05/23/21 TUG 12.0s    Time 8    Period Weeks    Status Partially Met      PT LONG TERM GOAL #6   Title Increase  R DF, Knee extension, hip flexion and abduction to 4/5    Baseline 3+/5 throughout; 05/16/21 4/5 R DF, 3+/5 r hip flexion, 4-/5 R knee ext.    Time 8    Period Weeks    Status On-going                   Plan - 06/11/21 1654     Clinical Impression Statement Continued with manual strengthening of R hip to ensure proper muscle isolation followed by balance tasks across compliant surfaces.   Added runners step and rocker board tasks to isolate ankle stability and ensure proper neutral positioning.    Personal Factors and Comorbidities Comorbidity 1    Comorbidities CP    Examination-Activity Limitations Locomotion Level    Stability/Clinical Decision Making Stable/Uncomplicated    Rehab Potential Good    PT Frequency 2x / week    PT Duration 8 weeks    PT Treatment/Interventions ADLs/Self Care Home Management;Aquatic Therapy;DME Instruction;Gait training;Stair training;Functional mobility training;Therapeutic activities;Therapeutic exercise;Balance training;Neuromuscular re-education;Patient/family education;Orthotic Fit/Training;Manual techniques    PT Next Visit Plan posterior chain strengthening, continue ankle streatgies to facilitae DF and stability    PT Home Exercise Plan [id number]             Patient will benefit from skilled therapeutic intervention in order to improve the following deficits and impairments:  Abnormal gait, Decreased endurance, Decreased activity tolerance, Decreased strength, Decreased balance, Decreased mobility, Difficulty walking, Increased muscle spasms, Decreased range of motion, Decreased coordination  Visit Diagnosis: Abnormal posture  Other abnormalities of gait and mobility  Unsteadiness on feet  Difficulty in walking, not elsewhere classified     Problem List Patient Active Problem List   Diagnosis Date Noted   SIRS due to infectious process with acute organ dysfunction ([place]) 03/14/2021   SIRS (systemic inflammatory response syndrome) ([place]) 03/12/2021   Syncope 03/12/2021   Traumatic brain injury with persistent deficit 03/12/2021   Hypokalemia 03/12/2021   Lactic acidosis 03/12/2021   Mixed hyperlipidemia 04/25/2018   Seizure disorder ([place]) 04/11/2018   Mechanical low back pain 05/12/2017   Dysarthria 12/20/2015   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/20/2012   Deafness, sensorineural 04/20/2012   Buzzing in ear 04/20/2012    Leaking percutaneous endoscopic gastrostomy (PEG) tube ([place]) 02/04/2012   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 pre-existing conscious levels, sequela ([place]) 10/12/2011   Cerebral infarct ([place]) 10/12/2011   Spastic hemiplegia of right dominant side due to noncerebrovascular etiology ([place]) 10/12/2011   Dysphagia 10/12/2011   Functioning G-tube 03/24/2011   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 [place], [place], [address] Phone: [phone]   Fax:  [phone]  Name: CAIDIN HEIDENREICH MRN: [id number] Date of Birth: 1985/12/01

## 2021-06-17 ENCOUNTER — Other Ambulatory Visit: Payer: Self-pay

## 2021-06-17 ENCOUNTER — Ambulatory Visit: Payer: BC Managed Care – PPO

## 2021-06-17 DIAGNOSIS — R293 Abnormal posture: Secondary | ICD-10-CM

## 2021-06-17 DIAGNOSIS — R2689 Other abnormalities of gait and mobility: Secondary | ICD-10-CM

## 2021-06-17 DIAGNOSIS — R2681 Unsteadiness on feet: Secondary | ICD-10-CM | POA: Diagnosis not present

## 2021-06-17 DIAGNOSIS — R262 Difficulty in walking, not elsewhere classified: Secondary | ICD-10-CM

## 2021-06-17 NOTE — Therapy (Signed)
[facility] [address] [place], [place], [address] Phone: [phone]   Fax:  [phone]  Physical Therapy Treatment  Patient Details  Name: ABDULKADIR Kane MRN: [id number] Date of Birth: 08/21/1985 Referring Provider (PT): Alger Simons MD   Encounter Date: 06/17/2021   PT End of Session - 06/17/21 1309     Visit Number 22    Number of Visits 29    Date for PT Re-Evaluation 07/11/21    Authorization Type BCBS    Authorization Time Period 06/16/21-07/12/21    Progress Note Due on Visit 20    PT Start Time 1235    PT Stop Time 1315    PT Time Calculation (min) 40 min    Equipment Utilized During Treatment Gait belt    Activity Tolerance Patient tolerated treatment well    Behavior During Therapy WFL for tasks assessed/performed             Past Medical History:  Diagnosis Date   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[place])    Chronic hepatitis B ([place])    Fixed pupils    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   Subjective Assessment - 06/17/21 1235     Subjective No falls to report, will f/u with orthotics tomorrow.    Patient is accompained by: Family member    Pertinent History Hx of CP    How long can you sit comfortably? unlimited    How long can you stand comfortably? <10 min    How long can you walk comfortably? <5 min    Currently in Pain? No/denies     Pain Onset [facility] Adult PT Treatment/Exercise - 06/17/21 0001       Ambulation/Gait   Ambulation/Gait Yes    Ambulation/Gait Assistance 4: Min guard    Ambulation Distance (Feet) 150 Feet    Assistive device None    Gait Comments supinated R foot, encouraged RLE marching to overcorrect for limited hip flexion in swing through      Lumbar Exercises: Supine   Bridge Non-compliant;Limitations    Bridge Limitations 30 reps      Knee/Hip Exercises: Supine   Other Supine Knee/Hip Exercises manually resisted hip fallouts R, 30x    Other Supine Knee/Hip Exercises manually resisted/directed flexion/ext in PNF patterns resustance at foot to promote DF and stepping mechanics, 30x      Knee/Hip Exercises: Sidelying   Hip ABduction Right    Hip ABduction Limitations 30x with PT maintaining neutral hip position to avoid activiating flexors    Clams R 30x against light manual resistance  Balance Exercises - 06/17/21 0001       Balance Exercises: Standing   Rockerboard Anterior/posterior;UE support;Limitations    Rockerboard Limitations Single UE support for static standing 60s fllowed by unsupported rocking for 60s with CGA    Other Standing Exercises runners step from 4" block stepping with RLE to promote ankle stability, 10x each leg                  PT Short Term Goals - 05/23/21 1548       PT SHORT TERM GOAL #1   Title Patient to demo I in initial HEP    Baseline TBD; 04/17/21 Instructed in walking program with new AFO    Time 4    Period Weeks    Status Achieved    Target Date 05/09/21      PT SHORT TERM GOAL #2   Title Patient to perform 5x STS in 12 s or less    Baseline 15.9s; 05/09/21 12.9s; 05/16/21 5x STS 17.2s; 05/23/21 9.72s    Time 4    Period Weeks    Status Achieved    Target Date 05/09/21      PT SHORT TERM GOAL #3   Title Assess gait velocity and set goal    Baseline  TBD; 04/22/21 Gait velocity 0.49 m/s, goal is 0.6 m/s    Time 4    Period Weeks    Status Achieved    Target Date 05/09/21      PT SHORT TERM GOAL #4   Title Assess DGI and set Goal    Baseline TBD; 04/17/21 DGI score 18/24, goal is 20; 05/23/21 DGI score 18    Time 4    Period Weeks    Status Achieved    Target Date 05/09/21               PT Long Term Goals - 05/23/21 1550       PT LONG TERM GOAL #1   Title The patient will demonstrate initiation and completion of HEP with supervision from caregiver at home    Baseline TBD; [id number]; 10/28/2 patient reluctant to perform HEP consistently    Time 8    Period Weeks    Status On-going      PT LONG TERM GOAL #2   Title Patient to regain 5d R DF AROM    Baseline 0d AROM R DF; 05/09/21 4d AROM DF; 05/16/21 8d AROM, 14d PROM R DF    Time 8    Period Weeks    Status Achieved      PT LONG TERM GOAL #3   Title The patient will ambulate 500' outdoors mod I over varying surfaces and surface heights including grass, curbs, and inclines to demonstrate improved functional mobility with community ambulation.    Baseline 112f in clinic with CGA; 05/16/21 UTA due to time constraints    Time 8    Period Weeks    Status On-going      PT LONG TERM GOAL #4   Title Assess DGI goal; 05/09/21 Goal is 20    Baseline TBD; DGI 17; 05/16/21 UTA due to time constarints; 05/23/21 DGI score 18    Time 8    Period Weeks    Status On-going      PT LONG TERM GOAL #5   Title Decrease TUG score to 12s    Baseline 15.9s initially; 05/16/21 17.2s; 05/23/21 TUG 12.0s    Time 8    Period Weeks  Status Partially Met      PT LONG TERM GOAL #6   Title Increase R DF, Knee extension, hip flexion and abduction to 4/5    Baseline 3+/5 throughout; 05/16/21 4/5 R DF, 3+/5 r hip flexion, 4-/5 R knee ext.    Time 8    Period Weeks    Status On-going                   Plan - 06/17/21 1309     Clinical Impression Statement Continued with R hip  strenghteningfocused on stabilization.  Attempted single leg bridge but patient too fatigued.  Fatigue played a roll today as performance was not his usual effort.  Will be seen for orthotic f/u tomorrow 11/30    Personal Factors and Comorbidities Comorbidity 1    Comorbidities CP    Examination-Activity Limitations Locomotion Level    Stability/Clinical Decision Making Stable/Uncomplicated    Rehab Potential Good    PT Frequency 2x / week    PT Duration 8 weeks    PT Treatment/Interventions ADLs/Self Care Home Management;Aquatic Therapy;DME Instruction;Gait training;Stair training;Functional mobility training;Therapeutic activities;Therapeutic exercise;Balance training;Neuromuscular re-education;Patient/family education;Orthotic Fit/Training;Manual techniques    PT Next Visit Plan posterior chain strengthening, continue ankle streatgies to facilitae DF and stability, AFOs?, R hip strength and stabilization tasks    PT Home Exercise Plan [id number]             Patient will benefit from skilled therapeutic intervention in order to improve the following deficits and impairments:  Abnormal gait, Decreased endurance, Decreased activity tolerance, Decreased strength, Decreased balance, Decreased mobility, Difficulty walking, Increased muscle spasms, Decreased range of motion, Decreased coordination  Visit Diagnosis: Abnormal posture  Other abnormalities of gait and mobility  Unsteadiness on feet  Difficulty in walking, not elsewhere classified     Problem List Patient Active Problem List   Diagnosis Date Noted   SIRS due to infectious process with acute organ dysfunction ([place]) 03/14/2021   SIRS (systemic inflammatory response syndrome) ([place]) 03/12/2021   Syncope 03/12/2021   Traumatic brain injury with persistent deficit 03/12/2021   Hypokalemia 03/12/2021   Lactic acidosis 03/12/2021   Mixed hyperlipidemia 04/25/2018   Seizure disorder ([place]) 04/11/2018   Mechanical low back pain  05/12/2017   Dysarthria 12/20/2015   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/20/2012   Deafness, sensorineural 04/20/2012   Buzzing in ear 04/20/2012   Leaking percutaneous endoscopic gastrostomy (PEG) tube ([place]) 02/04/2012   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 pre-existing conscious levels, sequela ([place]) 10/12/2011   Cerebral infarct ([place]) 10/12/2011   Spastic hemiplegia of right dominant side due to noncerebrovascular etiology ([place]) 10/12/2011   Dysphagia 10/12/2011   Functioning G-tube 03/24/2011   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 [place], [place], [address] Phone: [phone]   Fax:  [phone]  Name: Paul Kane MRN: [id number] Date of Birth: 05-02-1986

## 2021-06-19 ENCOUNTER — Ambulatory Visit (HOSPITAL_BASED_OUTPATIENT_CLINIC_OR_DEPARTMENT_OTHER): Payer: BC Managed Care – PPO | Attending: Physical Medicine & Rehabilitation | Admitting: Physical Therapy

## 2021-06-19 ENCOUNTER — Other Ambulatory Visit: Payer: Self-pay

## 2021-06-19 ENCOUNTER — Encounter (HOSPITAL_BASED_OUTPATIENT_CLINIC_OR_DEPARTMENT_OTHER): Payer: Self-pay | Admitting: Physical Therapy

## 2021-06-19 DIAGNOSIS — R262 Difficulty in walking, not elsewhere classified: Secondary | ICD-10-CM

## 2021-06-19 DIAGNOSIS — R2681 Unsteadiness on feet: Secondary | ICD-10-CM

## 2021-06-19 DIAGNOSIS — R2689 Other abnormalities of gait and mobility: Secondary | ICD-10-CM

## 2021-06-19 DIAGNOSIS — M6281 Muscle weakness (generalized): Secondary | ICD-10-CM | POA: Insufficient documentation

## 2021-06-19 DIAGNOSIS — R293 Abnormal posture: Secondary | ICD-10-CM | POA: Insufficient documentation

## 2021-06-19 NOTE — Therapy (Signed)
[facility] [place], [place], [address] Phone: [phone]   Fax:  [phone]  Physical Therapy Treatment  Patient Details  Name: Paul Kane MRN: [id number] Date of Birth: 05-16-1986 Referring Provider (PT): Alger Simons MD   Encounter Date: 06/19/2021   PT End of Session - 06/19/21 1003     Visit Number 23    Number of Visits 29    Date for PT Re-Evaluation 07/11/21    Authorization Type BCBS    Authorization Time Period 06/16/21-07/12/21    Progress Note Due on Visit 20    PT Start Time 0951    PT Stop Time 1030    PT Time Calculation (min) 39 min    Equipment Utilized During Treatment Gait belt    Activity Tolerance Patient tolerated treatment well    Behavior During Therapy WFL for tasks assessed/performed             Past Medical History:  Diagnosis Date   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[place])    Chronic hepatitis B ([place])    Fixed pupils    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   Subjective Assessment - 06/19/21 1320     Subjective Orthotic not yet in                                          PT Short Term Goals - 05/23/21 1548       PT SHORT TERM GOAL #1   Title Patient to demo I in initial HEP    Baseline TBD; 04/17/21 Instructed in walking program with new AFO    Time 4    Period Weeks    Status  Achieved    Target Date 05/09/21      PT SHORT TERM GOAL #2   Title Patient to perform 5x STS in 12 s or less    Baseline 15.9s; 05/09/21 12.9s; 05/16/21 5x STS 17.2s; 05/23/21 9.72s    Time 4    Period Weeks    Status Achieved    Target Date 05/09/21      PT SHORT TERM GOAL #3   Title Assess gait velocity and set goal    Baseline TBD; 04/22/21 Gait velocity 0.49 m/s, goal is 0.6 m/s    Time 4    Period Weeks    Status Achieved    Target Date 05/09/21      PT SHORT TERM GOAL #4   Title Assess DGI and set Goal    Baseline TBD; 04/17/21 DGI score 18/24, goal is 20; 05/23/21 DGI score 18    Time 4    Period Weeks    Status Achieved    Target Date 05/09/21  PT Long Term Goals - 05/23/21 1550       PT LONG TERM GOAL #1   Title The patient will demonstrate initiation and completion of HEP with supervision from caregiver at home    Baseline TBD; [id number]; 10/28/2 patient reluctant to perform HEP consistently    Time 8    Period Weeks    Status On-going      PT LONG TERM GOAL #2   Title Patient to regain 5d R DF AROM    Baseline 0d AROM R DF; 05/09/21 4d AROM DF; 05/16/21 8d AROM, 14d PROM R DF    Time 8    Period Weeks    Status Achieved      PT LONG TERM GOAL #3   Title The patient will ambulate 500' outdoors mod I over varying surfaces and surface heights including grass, curbs, and inclines to demonstrate improved functional mobility with community ambulation.    Baseline 155f in clinic with CGA; 05/16/21 UTA due to time constraints    Time 8    Period Weeks    Status On-going      PT LONG TERM GOAL #4   Title Assess DGI goal; 05/09/21 Goal is 20    Baseline TBD; DGI 17; 05/16/21 UTA due to time constarints; 05/23/21 DGI score 18    Time 8    Period Weeks    Status On-going      PT LONG TERM GOAL #5   Title Decrease TUG score to 12s    Baseline 15.9s initially; 05/16/21 17.2s; 05/23/21 TUG 12.0s    Time 8    Period Weeks    Status Partially  Met      PT LONG TERM GOAL #6   Title Increase R DF, Knee extension, hip flexion and abduction to 4/5    Baseline 3+/5 throughout; 05/16/21 4/5 R DF, 3+/5 r hip flexion, 4-/5 R knee ext.    Time 8    Period Weeks    Status On-going             Pt seen for aquatic therapy today.  Treatment took place in water 3.25-4.[facility] Temp of water was 91.  Pt entered/exited the pool via stairs step to pattern cga with bilat rail.   Warm up: water walking unsupported 6 lengths forward, backward and side stepping. Pt complete with distant supervision -VC for  straight path, balance, and cadence   Seated stretching manual assist hamstring, gastroc and adductos   Bad Ragaz, Pt with lumbar belt around hips, noodles and nek doodle for neck support.  .  Pt assisted into supine floating. . PT at torso and assisting with trunk left to right and vice versa to engage trunk muscles. PT then rotated trunk in order to engage abdomnal (internal and external obliques) -RUE stretching -RLE stretching -posterior chain ex: hip extension and knee flex   Supine suspension: kicking at hip, knee, add/abd. Kicking off wall. Facilitated by PT unilateral hip/knee flex then extension pushing away then pulling back     Pt requires buoyancy for support and to offload joints with strengthening exercises. Viscosity of the water is needed for resistance of strengthening; water current perturbations provides challenge to standing balance unsupported, requiring increased core activation.      Plan - 06/19/21 1332     Clinical Impression Statement Stretching of rle and UE today in suspended supine as well as posterior chain exercises. Mother reports he is draging  right foot more and tripping.  Orthotic not yet recieved. Pt with good effort with todays session.  RLE with improved movement and strength after stretching with kicking off wall.  No complaints of discomfort.     Stability/Clinical Decision Making Stable/Uncomplicated    Rehab Potential Good    PT Frequency 2x / week    PT Duration 8 weeks    PT Treatment/Interventions ADLs/Self Care Home Management;Aquatic Therapy;DME Instruction;Gait training;Stair training;Functional mobility training;Therapeutic activities;Therapeutic exercise;Balance training;Neuromuscular re-education;Patient/family education;Orthotic Fit/Training;Manual techniques    PT Next Visit Plan posterior chain strengthening, continue ankle streatgies to facilitae DF and stability, AFOs?, R hip strength and stabilization tasks    PT Home Exercise Plan [id number]             Patient will benefit from skilled therapeutic intervention in order to improve the following deficits and impairments:  Abnormal gait, Decreased endurance, Decreased activity tolerance, Decreased strength, Decreased balance, Decreased mobility, Difficulty walking, Increased muscle spasms, Decreased range of motion, Decreased coordination  Visit Diagnosis: Unsteadiness on feet  Other abnormalities of gait and mobility  Difficulty in walking, not elsewhere classified     Problem List Patient Active Problem List   Diagnosis Date Noted   SIRS due to infectious process with acute organ dysfunction ([place]) 03/14/2021   SIRS (systemic inflammatory response syndrome) ([place]) 03/12/2021   Syncope 03/12/2021   Traumatic brain injury with persistent deficit 03/12/2021   Hypokalemia 03/12/2021   Lactic acidosis 03/12/2021   Mixed hyperlipidemia 04/25/2018   Seizure disorder ([place]) 04/11/2018   Mechanical low back pain 05/12/2017   Dysarthria 12/20/2015   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/20/2012   Deafness, sensorineural 04/20/2012   Buzzing in ear 04/20/2012   Leaking percutaneous endoscopic gastrostomy (PEG) tube ([place]) 02/04/2012   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 pre-existing conscious levels, sequela ([place])  10/12/2011   Cerebral infarct ([place]) 10/12/2011   Spastic hemiplegia of right dominant side due to noncerebrovascular etiology ([place]) 10/12/2011   Dysphagia 10/12/2011   Functioning G-tube 03/24/2011   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   Hemiplegia of dominant side ([place]) 07/28/2010   CEREBRAL HEMORRHAGE 07/28/2010   PERSONAL HISTORY OF TRAUMATIC BRAIN INJURY 07/02/2010   ALLERGIC RHINITIS CAUSE UNSPECIFIED 10/25/2008   GASTROENTERITIS 08/09/2008   CONTACT DERMATITIS&OTHER ECZEMA DUE DETERGENTS 08/18/2007   HEPATITIS B, CHRONIC 06/28/2007   Chronic viral hepatitis B without delta-agent ([place]) 06/28/2007    Annamarie Major) Paul Kane MPT 06/19/2021, 1:37 PM  [facility] [place], [place], [address] Phone: [phone]   Fax:  [phone]  Name: Paul Kane MRN: [id number] Date of Birth: [birth year]

## 2021-06-24 ENCOUNTER — Ambulatory Visit: Payer: BC Managed Care – PPO

## 2021-06-25 ENCOUNTER — Encounter (HOSPITAL_BASED_OUTPATIENT_CLINIC_OR_DEPARTMENT_OTHER): Payer: Self-pay | Admitting: Physical Therapy

## 2021-06-25 ENCOUNTER — Other Ambulatory Visit: Payer: Self-pay

## 2021-06-25 ENCOUNTER — Ambulatory Visit (HOSPITAL_BASED_OUTPATIENT_CLINIC_OR_DEPARTMENT_OTHER): Payer: BC Managed Care – PPO | Admitting: Physical Therapy

## 2021-06-25 DIAGNOSIS — R2689 Other abnormalities of gait and mobility: Secondary | ICD-10-CM

## 2021-06-25 DIAGNOSIS — R2681 Unsteadiness on feet: Secondary | ICD-10-CM | POA: Diagnosis not present

## 2021-06-25 DIAGNOSIS — R293 Abnormal posture: Secondary | ICD-10-CM

## 2021-06-25 NOTE — Therapy (Signed)
[place], [place], [address] Phone: [phone]   Fax:  [phone]  Physical Therapy Treatment  Patient Details  Name: Paul Kane MRN: [id number] Date of Birth: August 13, 1985 Referring Provider (PT): Alger Simons MD   Encounter Date: 06/25/2021   PT End of Session - 06/25/21 0958     Visit Number 24    Number of Visits 29    Date for PT Re-Evaluation 07/11/21    Authorization Type BCBS    Authorization Time Period 06/16/21-07/12/21    Progress Note Due on Visit 19    PT Start Time 0958    PT Stop Time 1032    PT Time Calculation (min) 34 min    Equipment Utilized During Treatment Gait belt    Activity Tolerance Patient tolerated treatment well    Behavior During Therapy WFL for tasks assessed/performed             Past Medical History:  Diagnosis Date   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[place])    Chronic hepatitis B ([place])    Fixed pupils    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   Subjective Assessment - 06/25/21 1359     Subjective "Pt reports almost falling when arriving today"                                          PT Short Term Goals - 05/23/21 1548       PT SHORT TERM GOAL #1   Title Patient to demo I in initial HEP    Baseline TBD; 04/17/21 Instructed in walking program with new AFO    Time 4     Period Weeks    Status Achieved    Target Date 05/09/21      PT SHORT TERM GOAL #2   Title Patient to perform 5x STS in 12 s or less    Baseline 15.9s; 05/09/21 12.9s; 05/16/21 5x STS 17.2s; 05/23/21 9.72s    Time 4    Period Weeks    Status Achieved    Target Date 05/09/21      PT SHORT TERM GOAL #3   Title Assess gait velocity and set goal    Baseline TBD; 04/22/21 Gait velocity 0.49 m/s, goal is 0.6 m/s    Time 4    Period Weeks    Status Achieved    Target Date 05/09/21      PT SHORT TERM GOAL #4   Title Assess DGI and set Goal    Baseline TBD; 04/17/21 DGI score 18/24, goal is 20; 05/23/21 DGI score 18    Time 4    Period Weeks    Status Achieved    Target  Date 05/09/21               PT Long Term Goals - 05/23/21 1550       PT LONG TERM GOAL #1   Title The patient will demonstrate initiation and completion of HEP with supervision from caregiver at home    Baseline TBD; [place]; 10/28/2 patient reluctant to perform HEP consistently    Time 8    Period Weeks    Status On-going      PT LONG TERM GOAL #2   Title Patient to regain 5d R DF AROM    Baseline 0d AROM R DF; 05/09/21 4d AROM DF; 05/16/21 8d AROM, 14d PROM R DF    Time 8    Period Weeks    Status Achieved      PT LONG TERM GOAL #3   Title The patient will ambulate 500' outdoors mod I over varying surfaces and surface heights including grass, curbs, and inclines to demonstrate improved functional mobility with community ambulation.    Baseline 151f in clinic with CGA; 05/16/21 UTA due to time constraints    Time 8    Period Weeks    Status On-going      PT LONG TERM GOAL #4   Title Assess DGI goal; 05/09/21 Goal is 20    Baseline TBD; DGI 17; 05/16/21 UTA due to time constarints; 05/23/21 DGI score 18    Time 8    Period Weeks    Status On-going      PT LONG TERM GOAL #5   Title Decrease TUG score to 12s    Baseline 15.9s initially; 05/16/21 17.2s; 05/23/21 TUG 12.0s    Time 8    Period Weeks     Status Partially Met      PT LONG TERM GOAL #6   Title Increase R DF, Knee extension, hip flexion and abduction to 4/5    Baseline 3+/5 throughout; 05/16/21 4/5 R DF, 3+/5 r hip flexion, 4-/5 R knee ext.    Time 8    Period Weeks    Status On-going            Pt seen for aquatic therapy today.  Treatment took place in water 3.25-4.8 ft [facility] Temp of water was 91.  Pt entered/exited the pool via stairs step to pattern cga with bilat rail.   Warm up: water walking unsupported 6 lengths forward, backward and side stepping. Pt complete with distant supervision -VC for  straight path, balance, and cadence. No LOB today     Bad Ragaz, Pt with lumbar belt around hips, noodles and nek doodle for neck support.  .  Pt assisted into supine floating. . PT at torso and assisting with trunk left to right and vice versa to engage trunk muscles. PT then rotated trunk in order to engage abdomnal (internal and external obliques) -RUE stretching -RLE stretching -posterior chain ex: hip extension and knee flex   Supine suspension: kicking at hip, knee, add/abd. Kicking off wall. Facilitated by PT unilateral hip/knee flex then extension pushing away then pulling back       Plan - 06/25/21 1034     Clinical Impression Statement Pt late for appointment. Focused on strengthening in suspended supine post chain. Good strength  and improving coordination with kicking off of wall, improving balance as evidenced by amb submerged without LOB.    Stability/Clinical Decision Making Stable/Uncomplicated    Rehab Potential Good  PT Frequency 2x / week    PT Duration 8 weeks    PT Treatment/Interventions ADLs/Self Care Home Management;Aquatic Therapy;DME Instruction;Gait training;Stair training;Functional mobility training;Therapeutic activities;Therapeutic exercise;Balance training;Neuromuscular re-education;Patient/family education;Orthotic Fit/Training;Manual  techniques    PT Next Visit Plan posterior chain strengthening, continue ankle streatgies to facilitae DF and stability, AFOs?, R hip strength and stabilization tasks             Patient will benefit from skilled therapeutic intervention in order to improve the following deficits and impairments:  Abnormal gait, Decreased endurance, Decreased activity tolerance, Decreased strength, Decreased balance, Decreased mobility, Difficulty walking, Increased muscle spasms, Decreased range of motion, Decreased coordination  Visit Diagnosis: Abnormal posture  Other abnormalities of gait and mobility  Unsteadiness on feet     Problem List Patient Active Problem List   Diagnosis Date Noted   SIRS due to infectious process with acute organ dysfunction ([place]) 03/14/2021   SIRS (systemic inflammatory response syndrome) ([place]) 03/12/2021   Syncope 03/12/2021   Traumatic brain injury with persistent deficit 03/12/2021   Hypokalemia 03/12/2021   Lactic acidosis 03/12/2021   Mixed hyperlipidemia 04/25/2018   Seizure disorder ([employer] [facility]) 04/11/2018   Mechanical low back pain 05/12/2017   Dysarthria 12/20/2015   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/20/2012   Deafness, sensorineural 04/20/2012   Buzzing in ear 04/20/2012   Leaking percutaneous endoscopic gastrostomy (PEG) tube ([place]) 02/04/2012   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 pre-existing conscious levels, sequela ([place]) 10/12/2011   Cerebral infarct ([place]) 10/12/2011   Spastic hemiplegia of right dominant side due to noncerebrovascular etiology ([place]) 10/12/2011   Dysphagia 10/12/2011   Functioning G-tube 03/24/2011   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 [place], [place], [address] Phone: [phone]   Fax:  [phone]  Name: Paul Kane MRN: [id number] Date of Birth: 1986/05/09

## 2021-07-01 ENCOUNTER — Other Ambulatory Visit: Payer: Self-pay

## 2021-07-01 ENCOUNTER — Ambulatory Visit: Payer: BC Managed Care – PPO | Attending: Physical Medicine & Rehabilitation

## 2021-07-01 DIAGNOSIS — R2689 Other abnormalities of gait and mobility: Secondary | ICD-10-CM | POA: Insufficient documentation

## 2021-07-01 DIAGNOSIS — M6281 Muscle weakness (generalized): Secondary | ICD-10-CM | POA: Insufficient documentation

## 2021-07-01 DIAGNOSIS — R293 Abnormal posture: Secondary | ICD-10-CM | POA: Insufficient documentation

## 2021-07-01 DIAGNOSIS — R262 Difficulty in walking, not elsewhere classified: Secondary | ICD-10-CM | POA: Diagnosis present

## 2021-07-01 DIAGNOSIS — R2681 Unsteadiness on feet: Secondary | ICD-10-CM | POA: Insufficient documentation

## 2021-07-01 NOTE — Therapy (Signed)
[facility] [address] [place], [place], [address] Phone: [phone]   Fax:  [phone]  Physical Therapy Treatment  Patient Details  Name: Paul Kane MRN: [id number] Date of Birth: 1986-01-23 Referring Provider (PT): Alger Simons MD   Encounter Date: 07/01/2021   PT End of Session - 07/01/21 1406     Visit Number 25    Number of Visits 29    Date for PT Re-Evaluation 07/11/21    Authorization Type BCBS    Authorization Time Period 06/16/21-07/12/21    Progress Note Due on Visit 20    PT Start Time 1402    PT Stop Time 1443    PT Time Calculation (min) 41 min    Equipment Utilized During Treatment Gait belt    Activity Tolerance Patient tolerated treatment well    Behavior During Therapy WFL for tasks assessed/performed             Past Medical History:  Diagnosis Date   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[place])    Chronic hepatitis B ([place])    Fixed pupils    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   Subjective Assessment - 07/01/21 1405     Subjective Has recieved his AFO. Will also be getting a new brace for the knee.    Currently in Pain? No/denies              [facility] Adult PT Treatment/Exercise - 07/01/21 0001       Transfers   Transfers Sit to Stand;Stand to Sit;Stand Pivot Transfers    Sit to Stand 5: Supervision    Stand to Sit 5:  Supervision      Ambulation/Gait   Ambulation/Gait Yes    Ambulation/Gait Assistance 5: Supervision;4: Min guard    Ambulation/Gait Assistance Details completed ambulation outdoors over unlevel/paved surfaces x 500 ft supervision with intermittent CGA    Ambulation Distance (Feet) 500 Feet    Assistive device None    Ambulation Surface Unlevel;Outdoor;Paved    Gait Comments Pt recieved new AFO. PT assessed fit of new bracing. Pt denies pain or discomfort with bracing.      Neuro Re-ed    Neuro Re-ed Details  At stairs: completed forward step ups x 10 reps with RLE, then progressed to lateral step up x 5 reps. Single UE support required.      Exercises   Exercises Other Exercises    Other Exercises  Supine on mat completed the following: bridges with hip adduction (ball squeeze) 2 x 10 reps focused on control of movement. then with green theraband, completed alteranting marching in hooklying, x 10 reps bilaterally. Seated at EOB with RLE completed hamstring curl x 10 reps, focus on control with completion.  PT Education - 07/01/21 1606     Education Details Plan to finish up POC in aquatic per primary PT report Merry Proud). Continue to wear AFO.    Person(s) Educated Patient;Parent(s)    Methods Explanation    Comprehension Verbalized understanding              PT Short Term Goals - 05/23/21 1548       PT SHORT TERM GOAL #1   Title Patient to demo I in initial HEP    Baseline TBD; 04/17/21 Instructed in walking program with new AFO    Time 4    Period Weeks    Status Achieved    Target Date 05/09/21      PT SHORT TERM GOAL #2   Title Patient to perform 5x STS in 12 s or less    Baseline 15.9s; 05/09/21 12.9s; 05/16/21 5x STS 17.2s; 05/23/21 9.72s    Time 4    Period Weeks    Status Achieved    Target Date 05/09/21      PT SHORT TERM GOAL #3   Title Assess gait velocity and set goal    Baseline TBD; 04/22/21 Gait velocity 0.49 m/s, goal is 0.6 m/s     Time 4    Period Weeks    Status Achieved    Target Date 05/09/21      PT SHORT TERM GOAL #4   Title Assess DGI and set Goal    Baseline TBD; 04/17/21 DGI score 18/24, goal is 20; 05/23/21 DGI score 18    Time 4    Period Weeks    Status Achieved    Target Date 05/09/21               PT Long Term Goals - 07/01/21 1607       PT LONG TERM GOAL #1   Title The patient will demonstrate initiation and completion of HEP with supervision from caregiver at home    Baseline TBD; [id number]; 10/28/2 patient reluctant to perform HEP consistently; 12/13, still perform inconsistently, but report understanding.    Time 8    Period Weeks    Status Partially Met      PT LONG TERM GOAL #2   Title Patient to regain 5d R DF AROM    Baseline 0d AROM R DF; 05/09/21 4d AROM DF; 05/16/21 8d AROM, 14d PROM R DF    Time 8    Period Weeks    Status Achieved      PT LONG TERM GOAL #3   Title The patient will ambulate 500' outdoors mod I over varying surfaces and surface heights including grass, curbs, and inclines to demonstrate improved functional mobility with community ambulation.    Baseline 158f in clinic with CGA; 05/16/21 UTA due to time constraints; 500' outdoors, intermittent supervision/CGA    Time 8    Period Weeks    Status Partially Met      PT LONG TERM GOAL #4   Title Assess DGI goal; 05/09/21 Goal is 20    Baseline TBD; DGI 17; 05/16/21 UTA due to time constarints; 05/23/21 DGI score 18    Time 8    Period Weeks    Status On-going      PT LONG TERM GOAL #5   Title Decrease TUG score to 12s    Baseline 15.9s initially; 05/16/21 17.2s; 05/23/21 TUG 12.0s    Time 8    Period Weeks    Status Partially Met  PT LONG TERM GOAL #6   Title Increase R DF, Knee extension, hip flexion and abduction to 4/5    Baseline 3+/5 throughout; 05/16/21 4/5 R DF, 3+/5 r hip flexion, 4-/5 R knee ext.    Time 8    Period Weeks    Status Not Met                   Plan - 07/01/21  1608     Clinical Impression Statement Continued foucused on BLE strengthening to patient's tolerance. As well as continue to work on coordination and stability exercises. Completed gait training x 500 ft outdoors with intermittent CGA required. PT and pt/parent agreeable to finish POC in aquatic due to benefit. No more land appointments needed at this time. Patient has made significant progres toward LTGs with PT services.    Stability/Clinical Decision Making Stable/Uncomplicated    Rehab Potential Good    PT Frequency 2x / week    PT Duration 8 weeks    PT Treatment/Interventions ADLs/Self Care Home Management;Aquatic Therapy;DME Instruction;Gait training;Stair training;Functional mobility training;Therapeutic activities;Therapeutic exercise;Balance training;Neuromuscular re-education;Patient/family education;Orthotic Fit/Training;Manual techniques    PT Next Visit Plan continue aquatics. posterior chain strengthening, continue ankle streatgies to facilitae DF and stability, AFOs?, R hip strength and stabilization tasks             Patient will benefit from skilled therapeutic intervention in order to improve the following deficits and impairments:  Abnormal gait, Decreased endurance, Decreased activity tolerance, Decreased strength, Decreased balance, Decreased mobility, Difficulty walking, Increased muscle spasms, Decreased range of motion, Decreased coordination  Visit Diagnosis: Abnormal posture  Other abnormalities of gait and mobility  Unsteadiness on feet  Difficulty in walking, not elsewhere classified  Muscle weakness (generalized)     Problem List Patient Active Problem List   Diagnosis Date Noted   SIRS due to infectious process with acute organ dysfunction ([place]) 03/14/2021   SIRS (systemic inflammatory response syndrome) ([place]) 03/12/2021   Syncope 03/12/2021   Traumatic brain injury with persistent deficit 03/12/2021   Hypokalemia 03/12/2021   Lactic acidosis  03/12/2021   Mixed hyperlipidemia 04/25/2018   Seizure disorder ([place]) 04/11/2018   Mechanical low back pain 05/12/2017   Dysarthria 12/20/2015   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/20/2012   Deafness, sensorineural 04/20/2012   Buzzing in ear 04/20/2012   Leaking percutaneous endoscopic gastrostomy (PEG) tube ([place]) 02/04/2012   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 pre-existing conscious levels, sequela ([place]) 10/12/2011   Cerebral infarct ([place]) 10/12/2011   Spastic hemiplegia of right dominant side due to noncerebrovascular etiology ([place]) 10/12/2011   Dysphagia 10/12/2011   Functioning G-tube 03/24/2011   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 [place], [place], [address] Phone: [phone]   Fax:  [phone]  Name: Paul Kane MRN: [id number] Date of Birth: [birth year]

## 2021-07-04 ENCOUNTER — Encounter (HOSPITAL_BASED_OUTPATIENT_CLINIC_OR_DEPARTMENT_OTHER): Payer: Self-pay | Admitting: Physical Therapy

## 2021-07-04 ENCOUNTER — Ambulatory Visit (HOSPITAL_BASED_OUTPATIENT_CLINIC_OR_DEPARTMENT_OTHER): Payer: BC Managed Care – PPO | Admitting: Physical Therapy

## 2021-07-04 ENCOUNTER — Other Ambulatory Visit: Payer: Self-pay

## 2021-07-04 DIAGNOSIS — R293 Abnormal posture: Secondary | ICD-10-CM

## 2021-07-04 DIAGNOSIS — R2681 Unsteadiness on feet: Secondary | ICD-10-CM | POA: Diagnosis not present

## 2021-07-04 DIAGNOSIS — R2689 Other abnormalities of gait and mobility: Secondary | ICD-10-CM

## 2021-07-04 NOTE — Therapy (Signed)
[facility] [place], [place], [address] Phone: [phone]   Fax:  [phone]  Physical Therapy Treatment  Patient Details  Name: Paul Kane MRN: [id number] Date of Birth: 07-22-1985 Referring Provider (PT): Alger Simons MD   Encounter Date: 07/04/2021   PT End of Session - 07/04/21 1310     Visit Number 26    Number of Visits 29    Date for PT Re-Evaluation 07/11/21    Authorization Type BCBS    Authorization Time Period 06/16/21-07/12/21    Progress Note Due on Visit 20    PT Start Time 0946    PT Stop Time 1030    PT Time Calculation (min) 44 min    Equipment Utilized During Treatment Gait belt    Activity Tolerance Patient tolerated treatment well    Behavior During Therapy WFL for tasks assessed/performed             Past Medical History:  Diagnosis Date   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[place])    Chronic hepatitis B ([place])    Fixed pupils    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   Subjective Assessment - 07/04/21 1349     Subjective FAther repsots he has made appointment through [facility]    Currently in Pain? No/denies                                          PT Short Term Goals - 05/23/21 1548       PT SHORT TERM GOAL #1   Title Patient to demo I in initial HEP    Baseline TBD; 04/17/21 Instructed in  walking program with new AFO    Time 4    Period Weeks    Status Achieved    Target Date 05/09/21      PT SHORT TERM GOAL #2   Title Patient to perform 5x STS in 12 s or less    Baseline 15.9s; 05/09/21 12.9s; 05/16/21 5x STS 17.2s; 05/23/21 9.72s    Time 4    Period Weeks    Status Achieved    Target Date 05/09/21      PT SHORT TERM GOAL #3   Title Assess gait velocity and set goal    Baseline TBD; 04/22/21 Gait velocity 0.49 m/s, goal is 0.6 m/s    Time 4    Period Weeks    Status Achieved    Target Date 05/09/21      PT SHORT TERM GOAL #4   Title Assess DGI and set Goal    Baseline TBD; 04/17/21 DGI score 18/24, goal is 20; 05/23/21 DGI score 18    Time 4    Period Weeks  Status Achieved    Target Date 05/09/21               PT Long Term Goals - 07/01/21 1607       PT LONG TERM GOAL #1   Title The patient will demonstrate initiation and completion of HEP with supervision from caregiver at home    Baseline TBD; [id number]; 10/28/2 patient reluctant to perform HEP consistently; 12/13, still perform inconsistently, but report understanding.    Time 8    Period Weeks    Status Partially Met      PT LONG TERM GOAL #2   Title Patient to regain 5d R DF AROM    Baseline 0d AROM R DF; 05/09/21 4d AROM DF; 05/16/21 8d AROM, 14d PROM R DF    Time 8    Period Weeks    Status Achieved      PT LONG TERM GOAL #3   Title The patient will ambulate 500' outdoors mod I over varying surfaces and surface heights including grass, curbs, and inclines to demonstrate improved functional mobility with community ambulation.    Baseline 131f in clinic with CGA; 05/16/21 UTA due to time constraints; 500' outdoors, intermittent supervision/CGA    Time 8    Period Weeks    Status Partially Met      PT LONG TERM GOAL #4   Title Assess DGI goal; 05/09/21 Goal is 20    Baseline TBD; DGI 17; 05/16/21 UTA due to time constarints; 05/23/21 DGI score 18    Time 8    Period Weeks    Status  On-going      PT LONG TERM GOAL #5   Title Decrease TUG score to 12s    Baseline 15.9s initially; 05/16/21 17.2s; 05/23/21 TUG 12.0s    Time 8    Period Weeks    Status Partially Met      PT LONG TERM GOAL #6   Title Increase R DF, Knee extension, hip flexion and abduction to 4/5    Baseline 3+/5 throughout; 05/16/21 4/5 R DF, 3+/5 r hip flexion, 4-/5 R knee ext.    Time 8    Period Weeks    Status Not Met            Pt seen for aquatic therapy today.  Treatment took place in water 3.25-4.8 ft [facility] Temp of water was 91.  Pt entered/exited the pool via stairs step to pattern cga with bilat rail.   Warm up: water walking unsupported 6 lengths forward, backward and side stepping. Pt complete with distant supervision -VC for  straight path, balance, and cadence. No LOB today   Supine suspension Rhythmic movement for decreased LE tone -RUE stretching -RLE stretching -posterior chain ex: hip extension and knee flex -push-pull R/L LE then bilateral   Supine suspension: kicking at hip, knee, add/abd. Kicking off wall. Facilitated by PT unilateral hip/knee flex then extension pushing away then pulling back       Plan - 07/04/21 1349     Clinical Impression Statement Pt directed through strengtheing exercises mainly in supine position. Focus on LE's. Stretching and rhyhmic movements to decrease right extremity tone.  In review of on land assessments pt has met his max potential functionally in that setting. He has progressed well with strength in pool but is appraoching his maximum potential in this setting as well. I called and left message with pts father informing him of DC planning and  that appointments he scheduled through [facility] will be canclled.    PT Treatment/Interventions ADLs/Self Care Home Management;Aquatic Therapy;DME Instruction;Gait training;Stair training;Functional mobility training;Therapeutic activities;Therapeutic  exercise;Balance training;Neuromuscular re-education;Patient/family education;Orthotic Fit/Training;Manual techniques    PT Next Visit Plan DC             Patient will benefit from skilled therapeutic intervention in order to improve the following deficits and impairments:  Abnormal gait, Decreased endurance, Decreased activity tolerance, Decreased strength, Decreased balance, Decreased mobility, Difficulty walking, Increased muscle spasms, Decreased range of motion, Decreased coordination  Visit Diagnosis: Abnormal posture  Other abnormalities of gait and mobility  Unsteadiness on feet     Problem List Patient Active Problem List   Diagnosis Date Noted   SIRS due to infectious process with acute organ dysfunction ([place]) 03/14/2021   SIRS (systemic inflammatory response syndrome) ([place]) 03/12/2021   Syncope 03/12/2021   Traumatic brain injury with persistent deficit 03/12/2021   Hypokalemia 03/12/2021   Lactic acidosis 03/12/2021   Mixed hyperlipidemia 04/25/2018   Seizure disorder ([place]) 04/11/2018   Mechanical low back pain 05/12/2017   Dysarthria 12/20/2015   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/20/2012   Deafness, sensorineural 04/20/2012   Buzzing in ear 04/20/2012   Leaking percutaneous endoscopic gastrostomy (PEG) tube ([place]) 02/04/2012   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 pre-existing conscious levels, sequela ([place]) 10/12/2011   Cerebral infarct ([place]) 10/12/2011   Spastic hemiplegia of right dominant side due to noncerebrovascular etiology ([place]) 10/12/2011   Dysphagia 10/12/2011   Functioning G-tube 03/24/2011   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   Hemiplegia of dominant side ([place]) 07/28/2010   CEREBRAL HEMORRHAGE 07/28/2010   PERSONAL HISTORY OF TRAUMATIC BRAIN INJURY 07/02/2010   ALLERGIC RHINITIS CAUSE UNSPECIFIED 10/25/2008   GASTROENTERITIS 08/09/2008   CONTACT DERMATITIS&OTHER ECZEMA DUE DETERGENTS  08/18/2007   HEPATITIS B, CHRONIC 06/28/2007   Chronic viral hepatitis B without delta-agent ([place]) 06/28/2007    Annamarie Major) [place] MPT 07/04/2021, 1:55 PM  [facility] [address] [place], [place], [address] Phone: [phone]   Fax:  [phone]  Name: ANTE ARREDONDO MRN: [id number] Date of Birth: [birth year]

## 2021-07-08 ENCOUNTER — Ambulatory Visit: Payer: BC Managed Care – PPO

## 2021-07-10 ENCOUNTER — Ambulatory Visit (HOSPITAL_BASED_OUTPATIENT_CLINIC_OR_DEPARTMENT_OTHER): Payer: BC Managed Care – PPO | Admitting: Physical Therapy

## 2021-07-18 ENCOUNTER — Ambulatory Visit (HOSPITAL_BASED_OUTPATIENT_CLINIC_OR_DEPARTMENT_OTHER): Payer: BC Managed Care – PPO | Admitting: Physical Therapy

## 2021-07-18 ENCOUNTER — Other Ambulatory Visit: Payer: Self-pay

## 2021-07-18 ENCOUNTER — Encounter (HOSPITAL_BASED_OUTPATIENT_CLINIC_OR_DEPARTMENT_OTHER): Payer: Self-pay | Admitting: Physical Therapy

## 2021-07-18 DIAGNOSIS — R2681 Unsteadiness on feet: Secondary | ICD-10-CM | POA: Diagnosis not present

## 2021-07-18 DIAGNOSIS — R262 Difficulty in walking, not elsewhere classified: Secondary | ICD-10-CM

## 2021-07-18 DIAGNOSIS — M6281 Muscle weakness (generalized): Secondary | ICD-10-CM

## 2021-07-18 DIAGNOSIS — R293 Abnormal posture: Secondary | ICD-10-CM

## 2021-07-18 DIAGNOSIS — R2689 Other abnormalities of gait and mobility: Secondary | ICD-10-CM

## 2021-07-18 NOTE — Therapy (Signed)
[place], [place], [address] Phone: [phone]   Fax:  [phone]  Physical Therapy Treatment  Patient Details  Name: REDELL NAZIR MRN: [id number] Date of Birth: 27-Dec-1985 Referring Provider (PT): Alger Simons MD   Encounter Date: 07/18/2021   PT End of Session - 07/18/21 1501     Visit Number 27    Number of Visits 29    Date for PT Re-Evaluation 07/11/21    Authorization Type BCBS    Authorization Time Period 06/16/21-07/12/21    Progress Note Due on Visit 20    PT Start Time 1450    PT Stop Time 1535    PT Time Calculation (min) 45 min    Equipment Utilized During Treatment Gait belt    Activity Tolerance Patient tolerated treatment well    Behavior During Therapy WFL for tasks assessed/performed             Past Medical History:  Diagnosis Date   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[place])    Chronic hepatitis B ([place])    Fixed pupils    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   Subjective Assessment - 07/18/21 1610     Subjective Pt and father report a fall as pt was walking in the dark, fell and scraped his right elbow.    Currently in Pain? No/denies                                        PT Education - 07/18/21 1611     Education Details Father encouraged to bring pt to wellness center and continue  working on hte machines and getting into the pool with a family member having pt walk/complete HEP.              PT Short Term Goals - 05/23/21 1548       PT SHORT TERM GOAL #1   Title Patient to demo I in initial HEP    Baseline TBD; 04/17/21 Instructed in walking program with new AFO    Time 4    Period Weeks    Status Achieved    Target Date 05/09/21      PT SHORT TERM GOAL #2   Title Patient to perform 5x STS in 12 s or less    Baseline 15.9s; 05/09/21 12.9s; 05/16/21 5x STS 17.2s; 05/23/21 9.72s    Time 4    Period Weeks    Status Achieved    Target Date 05/09/21      PT SHORT TERM GOAL #3   Title Assess gait velocity and set goal    Baseline TBD; 04/22/21 Gait velocity 0.49 m/s, goal is 0.6 m/s    Time 4  Period Weeks    Status Achieved    Target Date 05/09/21      PT SHORT TERM GOAL #4   Title Assess DGI and set Goal    Baseline TBD; 04/17/21 DGI score 18/24, goal is 20; 05/23/21 DGI score 18    Time 4    Period Weeks    Status Achieved    Target Date 05/09/21               PT Long Term Goals - 07/18/21 1618       PT LONG TERM GOAL #1   Baseline added aquatics.  Uncertain if family will assist pt.    Status Partially Met           Pt seen for aquatic therapy today.  Treatment took place in water 3.25-4.8 ft in depth at the [facility] pool. Temp of water was 91.  Pt entered/exited the pool via stairs step to pattern cga with bilat rail.   Warm up: water walking unsupported 6 lengths forward, backward and side stepping. Pt complete with distant supervision changing directions without VC -VC for  straight path, balance, and cadence.    Supine suspension Rhythmic movement for decreased LE tone -RUE stretching -RLE stretching -posterior chain ex: hip extension and knee flex resisted by ankle buoy 2x10 R/L   Supine suspension: kicking at hip, knee, add/abd. Kicking off wall bilaterally then unilaterally facilitated by  therapist        Plan - 07/18/21 1613     Clinical Impression Statement Spoke with land therapist after last visit.  Pt has reached his maximal potential on land as well as in aquatics.  Father infomred via telephone call after last session informing of pending DC.  Father in agreement.  Pt directed through LE strengthening and coordination exercises.  DC instructions given to father to continue with use of wellness center to maintain and progress gains made in therapy. Pt did not met all goals but improved in all areas.    Stability/Clinical Decision Making Stable/Uncomplicated    Rehab Potential Good    PT Frequency 2x / week    PT Duration 8 weeks    PT Treatment/Interventions ADLs/Self Care Home Management;Aquatic Therapy;DME Instruction;Gait training;Stair training;Functional mobility training;Therapeutic activities;Therapeutic exercise;Balance training;Neuromuscular re-education;Patient/family education;Orthotic Fit/Training;Manual techniques    PT Home Exercise Plan [id number] aquatics added. Forward Walking - 1 x daily - 7 x weekly - 3 sets - 10 reps  Side Stepping - 1 x daily - 7 x weekly - 3 sets - 10 reps  Backward Walking - 1 x daily - 7 x weekly - 3 sets - 10 reps  Standing Hip Abduction Adduction at [employer] - 1 x daily - 7 x weekly - 3 sets - 10 reps  Standing Hip Flexion Extension at [employer] - 1 x daily - 7 x weekly - 3 sets - 10 reps  Standing March at [facility] - 1 x daily - 7 x weekly - 3 sets - 10 reps             Patient will benefit from skilled therapeutic intervention in order to improve the following deficits and impairments:  Abnormal gait, Decreased endurance, Decreased activity tolerance, Decreased strength, Decreased balance, Decreased mobility, Difficulty walking, Increased muscle spasms, Decreased range of motion, Decreased coordination  Visit Diagnosis: Abnormal posture  Other abnormalities of gait and mobility  Unsteadiness on feet  Difficulty in  walking, not elsewhere classified  Muscle weakness (generalized)  Problem List Patient Active Problem List   Diagnosis Date Noted   SIRS due to infectious process with acute organ dysfunction ([place]) 03/14/2021   SIRS (systemic inflammatory response syndrome) ([place]) 03/12/2021   Syncope 03/12/2021   Traumatic brain injury with persistent deficit 03/12/2021   Hypokalemia 03/12/2021   Lactic acidosis 03/12/2021   Mixed hyperlipidemia 04/25/2018   Seizure disorder ([place]) 04/11/2018   Mechanical low back pain 05/12/2017   Dysarthria 12/20/2015   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/20/2012   Deafness, sensorineural 04/20/2012   Buzzing in ear 04/20/2012   Leaking percutaneous endoscopic gastrostomy (PEG) tube ([place]) 02/04/2012   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 pre-existing conscious levels, sequela ([place]) 10/12/2011   Cerebral infarct ([place]) 10/12/2011   Spastic hemiplegia of right dominant side due to noncerebrovascular etiology ([place]) 10/12/2011   Dysphagia 10/12/2011   Functioning G-tube 03/24/2011   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   Hemiplegia of dominant side ([place]) 07/28/2010   CEREBRAL HEMORRHAGE 07/28/2010   PERSONAL HISTORY OF TRAUMATIC BRAIN INJURY 07/02/2010   ALLERGIC RHINITIS CAUSE UNSPECIFIED 10/25/2008   GASTROENTERITIS 08/09/2008   CONTACT DERMATITIS&OTHER ECZEMA DUE DETERGENTS 08/18/2007   HEPATITIS B, CHRONIC 06/28/2007   Chronic viral hepatitis B without delta-agent ([place]) 06/28/2007  PHYSICAL THERAPY DISCHARGE SUMMARY  Visits from Start of Care: 04/04/21  Current functional level related to goals / functional outcomes: Continues to need assist with all ADL's and functional mobility but has improved in strength of core and balance decreasing fall risk   Remaining deficits: Right Hemi -paresis, fall risk   Education / Equipment: Management of condition, safety, HEP   Patient agrees to discharge.  Patient goals were partially met. Patient is being discharged due to maximized rehab potential.    Vedia Pereyra, PT 07/18/2021, 4:26 PM  [employer] [place], [place], [address] Phone: [phone]   Fax:  [phone]  Name: PHILLIPPE ORLICK MRN: [id number] Date of Birth: [birth year]

## 2021-07-22 ENCOUNTER — Ambulatory Visit: Payer: BC Managed Care – PPO | Admitting: Rehabilitation

## 2021-07-29 ENCOUNTER — Ambulatory Visit: Payer: BC Managed Care – PPO

## 2021-08-05 ENCOUNTER — Ambulatory Visit (HOSPITAL_BASED_OUTPATIENT_CLINIC_OR_DEPARTMENT_OTHER): Payer: BC Managed Care – PPO | Admitting: Physical Therapy

## 2021-08-08 ENCOUNTER — Ambulatory Visit (HOSPITAL_BASED_OUTPATIENT_CLINIC_OR_DEPARTMENT_OTHER): Payer: BC Managed Care – PPO | Admitting: Physical Therapy

## 2021-08-13 ENCOUNTER — Ambulatory Visit (HOSPITAL_BASED_OUTPATIENT_CLINIC_OR_DEPARTMENT_OTHER): Payer: BC Managed Care – PPO | Admitting: Physical Therapy

## 2021-08-19 ENCOUNTER — Ambulatory Visit (HOSPITAL_BASED_OUTPATIENT_CLINIC_OR_DEPARTMENT_OTHER): Payer: BC Managed Care – PPO | Admitting: Physical Therapy

## 2021-09-24 ENCOUNTER — Emergency Department (HOSPITAL_BASED_OUTPATIENT_CLINIC_OR_DEPARTMENT_OTHER)
Admission: EM | Admit: 2021-09-24 | Discharge: 2021-09-24 | Disposition: A | Discharge status: Home / Self Care | Payer: BC Managed Care – PPO | Attending: Emergency Medicine | Admitting: Emergency Medicine

## 2021-09-24 ENCOUNTER — Emergency Department (HOSPITAL_BASED_OUTPATIENT_CLINIC_OR_DEPARTMENT_OTHER): Payer: BC Managed Care – PPO

## 2021-09-24 ENCOUNTER — Other Ambulatory Visit: Payer: Self-pay

## 2021-09-24 ENCOUNTER — Encounter (HOSPITAL_BASED_OUTPATIENT_CLINIC_OR_DEPARTMENT_OTHER): Payer: Self-pay | Admitting: Emergency Medicine

## 2021-09-24 DIAGNOSIS — L03032 Cellulitis of left toe: Secondary | ICD-10-CM | POA: Diagnosis not present

## 2021-09-24 DIAGNOSIS — M79675 Pain in left toe(s): Secondary | ICD-10-CM | POA: Diagnosis present

## 2021-09-24 MED ORDER — LIDOCAINE HCL 2 % IJ SOLN
10.0000 mL | Freq: Once | INTRAMUSCULAR | Status: AC
  Administered 2021-09-24: 200 mg
  Filled 2021-09-24: qty 20

## 2021-09-24 MED ORDER — BACITRACIN ZINC 500 UNIT/GM EX OINT: 1.0000 "application " | TOPICAL_OINTMENT | Freq: Two times a day (BID) | CUTANEOUS | 0 refills | Status: AC

## 2021-09-24 MED ORDER — BACITRACIN ZINC 500 UNIT/GM EX OINT
TOPICAL_OINTMENT | Freq: Once | CUTANEOUS | Status: AC
  Filled 2021-09-24: qty 28.35

## 2021-09-24 NOTE — ED Provider Notes (Signed)
?[facility] ?Provider Note ? ? ?CSN: [id number] ?Arrival date & time: 09/24/21  1014 ? ?  ? ?History ? ?Chief Complaint  ?Patient presents with  ? Toe Injury  ? ? ?CLYDELL MAHANNAH is a 36 y.o. male. ? ?HPI ?36 year old male with a history of TBI and subsequent intracranial shunt presents with left great toe pain.  Patient dropped a soda can on it at the end of January.  Had some pain and swelling but seemed to get better.  He recently went to the pool about a week ago and now over the last couple days has had pain to that toe.  No new trauma.  History is primarily from the mom and dad. ? ?Home Medications ?Prior to Admission medications   ?Medication Sig Start Date End Date Taking? Authorizing Provider  ?azelastine (ASTELIN) 0.1 % nasal spray Place into the nose. 04/25/21 04/25/22  [provider]  ?Cholecalciferol (VITAMIN D-3) 25 MCG (1000 UT) CAPS Take 1,000-2,000 Units by mouth daily.    [provider]  ?[employer] INSTANT FOOD THICKENER POWD TAKE AS DIRECTED ?Patient taking differently: as directed. 10/31/18   Meredith Staggers, MD  ?DENTA 5000 PLUS 1.1 % CREA dental cream Place 1 application onto teeth at bedtime. 06/05/20   [provider]  ?lacosamide (VIMPAT) 10 MG/ML oral solution 20 mg 2 times daily. 09/28/15   [provider]  ?levETIRAcetam (KEPPRA) 100 MG/ML solution 5 mg 2 times daily. 08/04/15   [provider]  ?loratadine (CLARITIN) 10 MG tablet Take 10 mg by mouth daily as needed for allergies or rhinitis.    [provider]  ?VIMPAT 10 MG/ML oral solution Take 200 mg by mouth 2 (two) times daily.    [provider]  ?   ? ?Allergies    ?Patient has no known allergies.   ? ?Review of Systems   ?Review of Systems  ?Musculoskeletal:  Positive for arthralgias.  ?Skin:  Positive for color change.  ? ?Physical Exam ?Updated Vital Signs ?BP 108/76 (BP Location: Right Arm)   Pulse 76   Temp 97.6 ?F (36.4 ?C) (Oral)   Resp 20    Ht 5\' 7"  (1.702 m)   Wt 69.9 kg   SpO2 100%   BMI 24.12 kg/m?  ?Physical Exam ?Vitals and nursing note reviewed.  ?Constitutional:   ?   Appearance: He is well-developed.  ?Cardiovascular:  ?   Rate and Rhythm: Normal rate and regular rhythm.  ?   Pulses:     ?     Dorsalis pedis pulses are 2+ on the left side.  ?Pulmonary:  ?   Effort: Pulmonary effort is normal.  ?Abdominal:  ?   General: There is no distension.  ?Musculoskeletal:  ?   Left foot: Tenderness present.  ?     Feet: ? ?Feet:  ?   Comments: Normal ROM of left great toe ?Skin: ?   General: Skin is warm and dry.  ?Neurological:  ?   Mental Status: He is alert.  ? ? ?ED Results / Procedures / Treatments   ?Labs ?(all labs ordered are listed, but only abnormal results are displayed) ?Labs Reviewed - No data to display ? ?EKG ?None ? ?Radiology ?No results found. ? ?Procedures ?Marland Kitchen.Incision and Drainage ? ?Date/Time: 09/24/2021 11:37 AM ?Performed by: Sherwood Gambler, MD ?Authorized by: Sherwood Gambler, MD  ? ?Consent:  ?  Consent obtained:  Verbal ?  Consent given by:  Parent ?Location:  ?  Type:  Abscess ?  Size:  1 cm ?  Location:  Lower extremity ?  Lower extremity location:  Toe ?  Toe location:  L big toe ?Pre-procedure details:  ?  Skin preparation:  Povidone-iodine ?Sedation:  ?  Sedation type:  None ?Anesthesia:  ?  Anesthesia method:  Local infiltration (digital block) ?  Local anesthetic:  Lidocaine 1% w/o epi ?Procedure details:  ?  Incision types:  Single straight ?  Drainage:  Purulent ?  Drainage amount:  Moderate ?  Wound treatment:  Wound left open ?Post-procedure details:  ?  Procedure completion:  Tolerated well, no immediate complications  ? ? ?Medications Ordered in ED ?Medications  ?lidocaine (XYLOCAINE) 2 % (with pres) injection 200 mg (has no administration in time range)  ? ? ?ED Course/ Medical Decision Making/ A&P ?  ?                        ?Medical Decision Making ?Amount and/or Complexity of Data Reviewed ?Radiology:  ordered. ? ?Risk ?OTC drugs. ?Prescription drug management. ? ? ?Patient's x-ray images were personally reviewed and show no obvious fracture.  However on exam he clinically has a small abscess/paronychia.  The nail looks like it is recovering from what ever injury happened a month ago but I do not think he needs immediate nail removal.  After a digital block and cleansing, it was incised as above with complete relief of the fluctuance.  Is much as could be expressed was expressed out.  However this is relatively small in general and so I do not think he needs systemic antibiotics and we will apply bacitracin and discussed wound care.  Follow-up with podiatry. ? ? ? ? ? ? ? ?Final Clinical Impression(s) / ED Diagnoses ?Final diagnoses:  ?None  ? ? ?Rx / DC Orders ?ED Discharge Orders   ? ? None  ? ?  ? ? ?  ?Sherwood Gambler, MD ?09/24/21 1139 ? ?

## 2021-09-24 NOTE — Discharge Instructions (Addendum)
Be sure to soak the toe in warm, soapy water at least twice per day.  Apply the antibiotic ointment after doing this.  Follow-up with the podiatrist as needed. ?

## 2021-09-24 NOTE — ED Triage Notes (Signed)
Left great  toe pain after dropping a soda can on it a month ago, toe has bruise and redness at the base,  pt has hx of TBI has a shunt ?

## 2021-10-01 ENCOUNTER — Other Ambulatory Visit: Payer: Self-pay

## 2021-10-01 ENCOUNTER — Ambulatory Visit (INDEPENDENT_AMBULATORY_CARE_PROVIDER_SITE_OTHER): Payer: BC Managed Care – PPO | Admitting: Podiatry

## 2021-10-01 ENCOUNTER — Encounter: Payer: Self-pay | Admitting: Physical Medicine & Rehabilitation

## 2021-10-01 ENCOUNTER — Encounter: Payer: Self-pay | Admitting: Podiatry

## 2021-10-01 ENCOUNTER — Ambulatory Visit (INDEPENDENT_AMBULATORY_CARE_PROVIDER_SITE_OTHER): Payer: BC Managed Care – PPO

## 2021-10-01 ENCOUNTER — Encounter
Payer: BC Managed Care – PPO | Attending: Physical Medicine & Rehabilitation | Admitting: Physical Medicine & Rehabilitation

## 2021-10-01 VITALS — BP 106/69 | HR 79 | Ht 67.0 in | Wt 160.0 lb

## 2021-10-01 DIAGNOSIS — S99922A Unspecified injury of left foot, initial encounter: Secondary | ICD-10-CM | POA: Diagnosis not present

## 2021-10-01 DIAGNOSIS — L03032 Cellulitis of left toe: Secondary | ICD-10-CM | POA: Diagnosis not present

## 2021-10-01 DIAGNOSIS — L84 Corns and callosities: Secondary | ICD-10-CM | POA: Diagnosis not present

## 2021-10-01 DIAGNOSIS — G8111 Spastic hemiplegia affecting right dominant side: Secondary | ICD-10-CM | POA: Insufficient documentation

## 2021-10-01 NOTE — Progress Notes (Signed)
? ?Subjective:  ? ? Patient ID: Paul Kane, male    DOB: 1986-06-20, 35 y.o.   MRN: [id number] ? ?HPI ? ?Paul Kane is here in follow up of his TBI and spastic right hemiparesis. He dropped a soda can on his left big toe and ended up getting an infection.  ? ?He completed another course of therapy and plateaued. He has been getting in the pool with his dad until the issue with his left great toe. ? ?Dad is with him today and feels that overall he has made a lot of progress since completing therapy and being able to get back to the wellness center and pool.  His strength is improved as well as his gait patterns in general. ? ?He is doing stretching every day at home especially in the morning.  He showed me some of these today.  He is not really stretching through his tone however. ? ? ?Pain Inventory ?Average Pain 3 ?Pain Right Now 3 ?My pain is intermittent and sharp ? ?LOCATION OF PAIN  Left greater toe ? ?BOWEL ?Number of stools per week: 3 ? ?BLADDER ?Normal ? ?Mobility ?walk without assistance ?how many minutes can you walk? 10 ?ability to climb steps?  yes ?do you drive?  no ?use a wheelchair ? ?Function ?disabled: date disabled 2011 ? ?Neuro/Psych ?numbness ? ?Prior Studies ?Any changes since last visit?  no, left foot xray ? ?Physicians involved in your care ?Any changes since last visit?  no ? ? ?Family History  ?Problem Relation Age of Onset  ? Diabetes Other   ? Hyperlipidemia Other   ? Hypertension Other   ? ?Social History  ? ?Socioeconomic History  ? Marital status: Single  ?  Spouse name: Not on file  ? Number of children: Not on file  ? Years of education: Not on file  ? Highest education level: Not on file  ?Occupational History  ? Not on file  ?Tobacco Use  ? Smoking status: Never  ? Smokeless tobacco: Never  ?Vaping Use  ? Vaping Use: Never used  ?Substance and Sexual Activity  ? Alcohol use: No  ? Drug use: No  ? Sexual activity: Never  ?Other Topics Concern  ? Not on file  ?Social History Narrative   ? Not on file  ? ?Social Determinants of Health  ? ?Financial Resource Strain: Not on file  ?Food Insecurity: Not on file  ?Transportation Needs: Not on file  ?Physical Activity: Not on file  ?Stress: Not on file  ?Social Connections: Not on file  ? ?Past Surgical History:  ?Procedure Laterality Date  ? cramiectomy    ? CRANIOPLASTY    ? REMOVAL OF GASTROSTOMY TUBE    ? VENTRICULOPERITONEAL SHUNT    ? ?Past Medical History:  ?Diagnosis Date  ? Cerebral thrombosis with cerebral infarction [facility])   ? Chronic hepatitis B ([facility])   ? Fixed pupils   ? 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[facility])   ? Hypertension   ? Intracranial injury of other and unspecified nature, without mention of open intracranial wound, unspecified state of consciousness   ? Intracranial shunt   ? Paralysis ([facility])   ? Spastic hemiplegia affecting dominant side ([facility])   ? Stroke [facility])   ? Traumatic brain injury   ? Trouble swallowing   ? Visual disturbance   ? Weakness   ? ?BP 106/69   Pulse 79   Ht 5\' 7"  (1.702 m)   Wt 160 lb (72.6 kg)   SpO2  98%   BMI 25.06 kg/m?  ? ?Opioid Risk Score:   ?Fall Risk Score:  `1 ? ?Depression screen PHQ 2/9 ? ?Depression screen [facility] 2/9 06/04/2021 03/19/2021 10/02/2020 06/05/2020 04/03/2020 01/03/2020 11/29/2017  ?Decreased Interest 0 1 0 0 0 0 0  ?Down, Depressed, Hopeless 0 1 0 0 0 0 0  ?PHQ - 2 Score 0 2 0 0 0 0 0  ?Altered sleeping - - - - - 0 -  ?Tired, decreased energy - - - - - 0 -  ?Change in appetite - - - - - 0 -  ?Feeling bad or failure about yourself  - - - - - 0 -  ?Trouble concentrating - - - - - 0 -  ?Moving slowly or fidgety/restless - - - - - 0 -  ?Suicidal thoughts - - - - - 0 -  ?PHQ-9 Score - - - - - 0 -  ?Some recent data might be hidden  ?  ?Review of Systems  ?Musculoskeletal:  Positive for gait problem.  ?     Left toe pain  ?All other systems reviewed and are negative. ? ?   ?Objective:  ? Physical Exam ? ?General: No acute distress ?HEENT: NCAT, EOMI, oral membranes moist ?Cards: reg rate  ?Chest: normal  effort ?Abdomen: Soft, NT, ND ?Skin: dry, intact ?Extremities: no edema ?Psych: pleasant and appropriate  ?Skin: intact, left great toe ?Neuro: alert, attentive. Spastic right hemiparesis. Continued equinovarus deformity right ankle..  Right knee hyperextends as well in stance even with KI. There is a mild effusion at the right lateral ankle.  He is somewhat tender with weightbearing.  Spasticity is generally controlled although he is hyperreflexic on the right. I can range right elbow wrist/fingers thru tone--3/4 ?Musculoskeletal: no knee pain  ?  ?  ?  ?  ?  ?Assessment & Plan:  ?ASSESSMENT:   ?1. History of traumatic brain injury with hydrocephalus, meningitis,   ?and right pontine stroke.   ?2. Spastic tetraplegia, dominant side more affected ?3. severe oropharyngeal dysphagia--improved--tolerating diet well.  Continues on thickened liquids ?4. Absence seizures ?5. Mechanical Low back pain: due to posture/altered gait patterns--improved ?6. Left great toe infection--podiatry f/u ?  ?  ?  ?PLAN:   ?1.  continued HEP.  ?2.  Spasticity has improved after Dysport injections.   ?-needs better ROM program at home.  We discussed the fact that he needs to stretch through his spastic joint and muscle not up to the point where he meets resistance. ?3.  Vimpat and keppra for seizure prophylaxis ? 4. tylenol or voltaren for pain ? ? ?Fifteen minutes of face to face patient care time were spent during this visit. All questions were encouraged and answered.  Follow up with me in 6 mos .  ?    ? ? ?

## 2021-10-01 NOTE — Patient Instructions (Signed)
PLEASE FEEL FREE TO CALL OUR OFFICE WITH ANY PROBLEMS OR QUESTIONS ([phone])      

## 2021-10-01 NOTE — Progress Notes (Signed)
G

## 2021-10-02 NOTE — Progress Notes (Signed)
Subjective:  ? ?Patient ID: Paul Kane, male   DOB: 36 y.o.   MRN: [id number]  ? ?HPI ?Patient presents stating that he suffered a right big toe injury a month ago and presents with his parents.  There he does have a lot of mental deficits secondary to injury 12 years ago and is not communicative with [place] currently.  Patient does live with his caregivers and does not smoke and is not significantly active ? ? ?Review of Systems  ?All other systems reviewed and are negative. ? ? ?   ?Objective:  ?Physical Exam ?Vitals and nursing note reviewed.  ?Constitutional:   ?   Appearance: He is well-developed.  ?Pulmonary:  ?   Effort: Pulmonary effort is normal.  ?Musculoskeletal:     ?   General: Normal range of motion.  ?Skin: ?   General: Skin is warm.  ?Neurological:  ?   Mental Status: He is alert.  ?  ?Neurovascular status was found to be intact muscle strength is found to be adequate range of motion adequate.  Patient does have discomfort in the right big toe with some redness was on an antibiotic and the redness has diminished and it is localized to the proximal nail fold with no current pain noted.  Difficult to tell as patient is not communicative and does not have normal gait pattern ? ?   ?Assessment:  ?Trauma to the right hallux which is created stress on the nailbed with low-grade redness consistent with trauma ? ?   ?Plan:  ?H&P reviewed condition recommended soaks bandage usage and it should heal uneventfully even though the nail may loosen and eventually I might have to remove it.  Patient will be seen back as needed and all questions answered as the parents are quite concerned about his condition and structure ?   ? ? ?

## 2021-12-03 ENCOUNTER — Telehealth: Payer: Self-pay

## 2021-12-03 NOTE — Telephone Encounter (Signed)
Paul Kane would like Paul Kane to do "pool" therapy again. To work on his gait, balance and right-sided muscle functions. It was very beneficial in the past.  If granted please place the referral to [facility].  ? ?Call back phone [phone]. ?

## 2021-12-10 NOTE — Telephone Encounter (Signed)
Dad informed.

## 2021-12-19 ENCOUNTER — Encounter: Payer: Self-pay | Admitting: Podiatry

## 2021-12-19 ENCOUNTER — Ambulatory Visit (INDEPENDENT_AMBULATORY_CARE_PROVIDER_SITE_OTHER): Payer: BC Managed Care – PPO | Admitting: Podiatry

## 2021-12-19 DIAGNOSIS — L603 Nail dystrophy: Secondary | ICD-10-CM | POA: Diagnosis not present

## 2021-12-19 NOTE — Progress Notes (Signed)
  Subjective:  Patient ID: Paul Kane, male    DOB: 23-Feb-1986,   MRN: [id number]  Chief Complaint  Patient presents with   Nail Problem     Left great toe- painful    36 y.o. male presents for concern of  left great toe pain that started three months ago. He was seen by Dr. Charlsie Merles after dropping a coke bottle on the toe. Had some pain then and were waiting for nail to fall off. Here today with caregiver as he was complaining of some pain around the nail today and concerned. . Denies any other pedal complaints. Denies n/v/f/c.   Past Medical History:  Diagnosis Date   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[facility])    Chronic hepatitis B ([facility])    Fixed pupils    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[facility])    Hypertension    Intracranial injury of other and unspecified nature, without mention of open intracranial wound, unspecified state of consciousness    Intracranial shunt    Paralysis ([facility])    Spastic hemiplegia affecting dominant side ([facility])    Stroke ([facility])    Traumatic brain injury    Trouble swallowing    Visual disturbance    Weakness     Objective:  Physical Exam: Vascular: DP/PT pulses 2/4 bilateral. CFT <3 seconds. Normal hair growth on digits. No edema.  Skin. No lacerations or abrasions bilateral feet. Left hallux nail distal aspect fairly loose and unattached to nail bed. New nail growing in appears well healed. No erythema edema or purulence noted.  Hyperkeratotic tissue to plantar fifth metatarsals bilateral.   Musculoskeletal: MMT 5/5 bilateral lower extremities in DF, PF, Inversion and Eversion. Deceased ROM in DF of ankle joint.   Neurological: Sensation intact to light touch.   Assessment:   1. Onychodystrophy      Plan:  Patient was evaluated and treated and all questions answered. Discussed injury to the nail bed and nail and treatment options.  Debrided hallux nail back as courtesy today to patient comfort. Was a small area of bleeding covered with neosporin  and bandaid. Advised to keep eye on this area for any redness swelling or increased pain and to return if that is the case.  Hyperkeratotic tissue was debrided without incident as courtesy.  Patient to return as needed for any continued pain.   Louann Sjogren, DPM

## 2022-01-05 ENCOUNTER — Ambulatory Visit (INDEPENDENT_AMBULATORY_CARE_PROVIDER_SITE_OTHER): Payer: BC Managed Care – PPO | Admitting: Podiatry

## 2022-01-05 ENCOUNTER — Encounter: Payer: Self-pay | Admitting: Podiatry

## 2022-01-05 DIAGNOSIS — L84 Corns and callosities: Secondary | ICD-10-CM

## 2022-01-05 NOTE — Progress Notes (Signed)
Subjective:   Patient ID: Paul Kane, male   DOB: 36 y.o.   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

## 2022-03-04 ENCOUNTER — Encounter: Payer: Self-pay | Admitting: Physical Medicine & Rehabilitation

## 2022-03-04 ENCOUNTER — Encounter
Payer: BC Managed Care – PPO | Attending: Physical Medicine & Rehabilitation | Admitting: Physical Medicine & Rehabilitation

## 2022-03-04 VITALS — BP 99/65 | HR 73 | Ht 67.0 in | Wt 159.0 lb

## 2022-03-04 DIAGNOSIS — G8111 Spastic hemiplegia affecting right dominant side: Secondary | ICD-10-CM | POA: Diagnosis present

## 2022-03-04 NOTE — Patient Instructions (Signed)
PLEASE FEEL FREE TO CALL OUR OFFICE WITH ANY PROBLEMS OR QUESTIONS ([phone])      

## 2022-03-04 NOTE — Progress Notes (Signed)
Subjective:    Patient ID: Paul Kane, male    DOB: [birth year], 36 y.o.   MRN: [id number]  HPI  [name] is here in follow up of his TBI/CVA. He fell about 3 weeks in the bathroom when his right foot ?gave out. He injured his back and elbow but seems to be recovering.   He is experiencing more tightness in his elbow and right heel over the last few months in general. His balance has worsened over this time as well. He struggled to walk from parking lot into the office today.  He still walks without any adaptive device other than his AFO.   Pain Inventory Average Pain 0 Pain Right Now 0 My pain is intermittent and sharp  LOCATION OF PAIN  elbow  BOWEL Number of stools per week: 7   BLADDER Normal    Mobility walk with assistance ability to climb steps?  yes  Function disabled: date disabled .  Neuro/Psych No problems in this area  Prior Studies Any changes since last visit?  no  Physicians involved in your care Any changes since last visit?  no   Family History  Problem Relation Age of Onset   Diabetes Other    Hyperlipidemia Other    Hypertension Other    Social History   Socioeconomic History   Marital status: Single    Spouse name: Not on file   Number of children: Not on file   Years of education: Not on file   Highest education level: Not on file  Occupational History   Not on file  Tobacco Use   Smoking status: Never   Smokeless tobacco: Never  Vaping Use   Vaping Use: Never used  Substance and Sexual Activity   Alcohol use: No   Drug use: No   Sexual activity: Never  Other Topics Concern   Not on file  Social History Narrative   Not on file   Social Determinants of Health   Financial Resource Strain: Not on file  Food Insecurity: Not on file  Transportation Needs: Not on file  Physical Activity: Not on file  Stress: Not on file  Social Connections: Not on file   Past Surgical History:  Procedure Laterality Date   cramiectomy      CRANIOPLASTY     REMOVAL OF GASTROSTOMY TUBE     VENTRICULOPERITONEAL SHUNT     Past Medical History:  Diagnosis Date   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HCC)    Chronic hepatitis B (HCC)    Fixed pupils    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HCC)    Hypertension    Intracranial injury of other and unspecified nature, without mention of open intracranial wound, unspecified state of consciousness    Intracranial shunt    Paralysis (HCC)    Spastic hemiplegia affecting dominant side (HCC)    Stroke (HCC)    Traumatic brain injury (HCC)    Trouble swallowing    Visual disturbance    Weakness    Ht 5\' 7"  (1.702 m)   Wt 159 lb (72.1 kg)   BMI 24.90 kg/m   Opioid Risk Score:   Fall Risk Score:  `1  Depression screen PHQ 2/9     03/04/2022    3:40 PM 10/01/2021    1:09 PM 06/04/2021    1:32 PM 03/19/2021   11:32 AM 10/02/2020   10:31 AM 06/05/2020   10:57 AM 04/03/2020   11:44 AM  Depression screen PHQ 2/9  Decreased Interest 0 0 0 1 0 0 0  Down, Depressed, Hopeless 0 0 0 1 0 0 0  PHQ - 2 Score 0 0 0 2 0 0 0     Review of Systems  All other systems reviewed and are negative.     Objective:   Physical Exam Gen: no distress, normal appearing HEENT: oral mucosa pink and moist, NCAT Cardio: Reg rate Chest: normal effort, normal rate of breathing Abd: soft, non-distended Ext: no edema Psych: pleasant, normal affect Skin: intact Neuro: Patient is alert and oriented.  Speech remains very dysarthric.  Has reasonable insight and awareness.  Left-sided motor is 4 out of 5 proximal distal.  Right upper extremity is 2-3 out of 5 and limited by tone especially in his elbow and wrist which is 2-3 out of 4.  Right lower extremity is 4 out of 5 proximal to 1-2 out of 5 distally with significant equinovarus deformity noted at the right ankle.  I am able to reduce the ankle back into neutral with some force.  Grade is tone at the ankle at 3 out of 4.  Patient ambulates with  significant inversion of the foot on the right side. Musculoskeletal: Mild pain at the right elbow with improvement of effusion already.  Minimal low back pain notable.        1. History of traumatic brain injury with hydrocephalus, meningitis,   and right pontine stroke.   2. Spastic tetraplegia, dominant side more affected 3. severe oropharyngeal dysphagia--improved--tolerating diet well.  Continues on thickened liquids 4. Absence seizures 5. Mechanical Low back pain: due to posture/altered gait patterns--improved 6. Left great toe infection--podiatry f/u       PLAN:   1.  Refer to outpatient physical therapy including pool therapy for gait and spasticity.  2.  need to revisit botulinum toxin injections.  We will schedule for Xeomin injections 500 units to the right biceps as well as right gastrocs and 3.  Vimpat and keppra for seizure prophylaxis  4. tylenol or voltaren for pain     Fifteen minutes of face to face patient care time were spent during this visit. All questions were encouraged and answered.  Follow up with me in about 6 to 8 weeks

## 2022-03-26 ENCOUNTER — Ambulatory Visit: Payer: BC Managed Care – PPO | Attending: Physical Medicine & Rehabilitation | Admitting: Occupational Therapy

## 2022-03-26 ENCOUNTER — Ambulatory Visit: Payer: BC Managed Care – PPO

## 2022-03-26 DIAGNOSIS — M6281 Muscle weakness (generalized): Secondary | ICD-10-CM | POA: Insufficient documentation

## 2022-03-26 DIAGNOSIS — R293 Abnormal posture: Secondary | ICD-10-CM | POA: Diagnosis present

## 2022-03-26 DIAGNOSIS — G8111 Spastic hemiplegia affecting right dominant side: Secondary | ICD-10-CM | POA: Diagnosis present

## 2022-03-26 DIAGNOSIS — R262 Difficulty in walking, not elsewhere classified: Secondary | ICD-10-CM | POA: Insufficient documentation

## 2022-03-26 DIAGNOSIS — R2681 Unsteadiness on feet: Secondary | ICD-10-CM | POA: Insufficient documentation

## 2022-03-26 DIAGNOSIS — R2689 Other abnormalities of gait and mobility: Secondary | ICD-10-CM | POA: Insufficient documentation

## 2022-03-26 DIAGNOSIS — M25611 Stiffness of right shoulder, not elsewhere classified: Secondary | ICD-10-CM | POA: Insufficient documentation

## 2022-03-26 DIAGNOSIS — M25621 Stiffness of right elbow, not elsewhere classified: Secondary | ICD-10-CM | POA: Insufficient documentation

## 2022-03-26 DIAGNOSIS — M25641 Stiffness of right hand, not elsewhere classified: Secondary | ICD-10-CM | POA: Insufficient documentation

## 2022-03-26 NOTE — Therapy (Signed)
OUTPATIENT OCCUPATIONAL THERAPY NEURO EVALUATION  Patient Name: Paul Kane MRN: [id number] DOB:1985/07/21, 36 y.o., male Today's Date: 03/26/2022  PCP: Bailey Mech, PA- C REFERRING PROVIDER: Ranelle Oyster, MD   OT End of Session - 03/26/22 1348     Visit Number 1    Number of Visits 9    Date for OT Re-Evaluation 06/19/22    Authorization Type [employer]BS primary/ [place] Medicaid secondary    OT Start Time 0935    OT Stop Time 1017    OT Time Calculation (min) 42 min             Past Medical History:  Diagnosis Date   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HCC)    Chronic hepatitis B (HCC)    Fixed pupils    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HCC)    Hypertension    Intracranial injury of other and unspecified nature, without mention of open intracranial wound, unspecified state of consciousness    Intracranial shunt    Paralysis (HCC)    Spastic hemiplegia affecting dominant side (HCC)    Stroke (HCC)    Traumatic brain injury (HCC)    Trouble swallowing    Visual disturbance    Weakness    Past Surgical History:  Procedure Laterality Date   cramiectomy     CRANIOPLASTY     REMOVAL OF GASTROSTOMY TUBE     VENTRICULOPERITONEAL SHUNT     Patient Active Problem List   Diagnosis Date Noted   SIRS due to infectious process with acute organ dysfunction (HCC) 03/14/2021   SIRS (systemic inflammatory response syndrome) (HCC) 03/12/2021   Syncope 03/12/2021   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) 03/12/2021   Hypokalemia 03/12/2021   Lactic acidosis 03/12/2021   Mixed hyperlipidemia 04/25/2018   Seizure disorder (HCC) 04/11/2018   Mechanical low back pain 05/12/2017   Dysarthria 12/20/2015   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/20/2012   Deafness, sensorineural 04/20/2012   Buzzing in ear 04/20/2012   Leaking percutaneous endoscopic gastrostomy (PEG) tube (HCC) 02/04/2012   Diffuse traumatic brain injury with loss of consciousness greater than 24  hours with return to pre-existing conscious levels, sequela (HCC) 10/12/2011   Cerebral infarct (HCC) 10/12/2011   Spastic hemiplegia of right dominant side due to noncerebrovascular etiology (HCC) 10/12/2011   Dysphagia 10/12/2011   Functioning G-tube 03/24/2011   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   Hemiplegia of dominant side (HCC) 07/28/2010   CEREBRAL HEMORRHAGE 07/28/2010   PERSONAL HISTORY OF TRAUMATIC BRAIN INJURY 07/02/2010   ALLERGIC RHINITIS CAUSE UNSPECIFIED 10/25/2008   GASTROENTERITIS 08/09/2008   CONTACT DERMATITIS&OTHER ECZEMA DUE DETERGENTS 08/18/2007   HEPATITIS B, CHRONIC 06/28/2007   Chronic viral hepatitis B without delta-agent (HCC) 06/28/2007    ONSET DATE: 03/04/22 (referral date)  REFERRING DIAG: G81.11 (ICD-10-CM) - Spastic hemiplegia of right dominant side due to noncerebrovascular etiology   THERAPY DIAG:  Spastic hemiplegia affecting right dominant side, unspecified etiology (HCC)  Muscle weakness (generalized)  Stiffness of right shoulder, not elsewhere classified  Stiffness of right elbow, not elsewhere classified  Stiffness of right hand, not elsewhere classified  Rationale for Evaluation and Treatment Rehabilitation  SUBJECTIVE:   SUBJECTIVE STATEMENT: Pt had a fall a few weeks ago in the bathroom when his right foot gave out. He injured his back and right elbow (ruptured bursa). but seems to be recovering. He is experiencing more tightness in his elbow and right heel over the last few months in general. His balance has worsened over this  time as well.   Pt accompanied by: self and family member (dad)  PERTINENT HISTORY: 1. History of traumatic brain injury with hydrocephalus, meningitis,   and right pontine stroke (2011) 2. Spastic tetraplegia, dominant side more affected 3. severe oropharyngeal dysphagia--improved--tolerating diet well.  Continues on thickened liquids 4. Absence seizures 5. Mechanical Low back pain: due to  posture/altered gait patterns--improved 6. Left great toe infection--podiatry f/u    PRECAUTIONS: Fall and Other: h/o seizures  WEIGHT BEARING RESTRICTIONS No  PAIN:  Are you having pain? No and does report pain in R elbow when he touches it, but does not hurt at rest  FALLS: Has patient fallen in last 6 months? Yes. Number of falls 4-5  LIVING ENVIRONMENT: Lives with: lives with their family Lives in: House/apartment Stairs: Yes: Internal: full flight to 2nd floor, but bedroom and bathroom on main floor and does not need to go up steps; can reach both and External: 3 steps; can reach both Has following equipment at home: shower chair and Grab bars  PLOF: Needs assistance with ADLs  PATIENT GOALS "arm" stronger and flexible  OBJECTIVE:   HAND DOMINANCE: Right  ADLs: Mobility: Mod I around the house, hand held assist when in the community Grooming: Mod I  UB Dressing: Mod I LB Dressing: family assists with donning AFO and tying shoes Toileting: Mod I - utilizes bidet for hygiene Bathing: family assists with bathing, he completes ~90% Tub Shower transfers: Supervision/assist with shower transfers Equipment: Shower seat with back, Grab bars, and Walk in shower  UPPER EXTREMITY ROM     Active ROM Right eval Left eval  Shoulder flexion 106   Shoulder abduction    Shoulder adduction    Shoulder extension    Shoulder internal rotation    Shoulder external rotation    Elbow flexion 123   Elbow extension -34   Wrist flexion 12   Wrist extension -12   Wrist ulnar deviation    Wrist radial deviation    Wrist pronation    Wrist supination    (Blank rows = not tested)  Passive ROM Right eval Left eval  Shoulder flexion 110   Shoulder abduction    Shoulder adduction    Shoulder extension    Shoulder internal rotation    Shoulder external rotation    Elbow flexion 143   Elbow extension -32   Wrist flexion 40   Wrist extension 34   Wrist ulnar deviation     Wrist radial deviation    Wrist pronation    Wrist supination    (Blank rows = not tested)  HAND FUNCTION: Pt is able to manually open hand with great effort  MUSCLE TONE: RUE: Moderate, Hypertonic, and Modifed Ashworth Scale 3 = Considerable increase in muscle tone, passive movement difficult  COGNITION: Overall cognitive status: Impaired, History of cognitive impairments - at baseline, and father present providing majority of information   TODAY'S TREATMENT:  PROM to R shoulder and elbow with focus on increased ROM.   Therapist instructed in shoulder ROM and elbow extension with SPC as dowel with focus on elbow flexion/extension and shoulder circles to facilitate increased shoulder flexion/extension.    PATIENT EDUCATION: Education details: Educated on role and purpose of OT as well as potential interventions and goals for therapy based on initial evaluation findings. Person educated: Patient and Parent Education method: Explanation, Demonstration, Actor cues, and Handouts Education comprehension: needs further education   HOME EXERCISE PROGRAM: Access Code: [email or web address] URL: [email or web address] Date:  03/26/2022 Prepared by: [facility] - Outpatient  Rehab - [facility]  Exercises - Seated Shoulder Circles AAROM with Dowel into Wall  - 1 x daily - 2 sets - 10 reps - Seated Shoulder Flexion Extension AAROM with Dowel into Wall  - 1 x daily - 2 sets - 10 reps    GOALS: Goals reviewed with patient? No  SHORT TERM GOALS: Target date: 04/24/22  Pt and caregiver will be independent with HEP for shoulder and elbow ROM. Baseline: Pt not currently engaging in any ROM/stretching program Goal status: INITIAL  2.  Pt will demonstrate improved active elbow extension to -30* to increase functional use Baseline: -34* Goal status: INITIAL  3.  Pt will demonstrate improved shoulder flexion to 110* to increase functional use Baseline: 106* Goal status:  INITIAL   LONG TERM GOALS: Target date: 06/19/22  Pt will tolerate advanced HEP/stretching program for RUE. Baseline: No HEP at this time Goal status: INITIAL  2.  Pt will demonstrate improved active elbow extension to -20* to increase functional use Baseline: -34* Goal status: INITIAL  3.  Pt will demonstrate improved shoulder flexion to 115* to allow for improved ease with ADLs. Baseline: 106* Goal status: INITIAL  4.  Pt will be independent with splint wear and care PRN Baseline: splinting needs TBD Goal status: INITIAL  ASSESSMENT:  CLINICAL IMPRESSION: Patient is a 36 y.o. male who was seen today for occupational therapy evaluation for management of spasticity and pain in RUE s/p recent fall. Pt is s/p TBI/CVA in 2011 and recent fall ~6 weeks ago resulting in ruptured bursa of R elbow. Pt accompanied by his father, both express desire to gain some increased mobility and flexibility in R shoulder and elbow.  Pt currently lives with parents who assist him with bathing and dressing tasks as needed. Pt will benefit from skilled occupational therapy services to address ROM, pain management, GM/FM control, cognition, safety awareness, introduction of compensatory strategies/AE prn, and implementation of an HEP to improve participation and safety during ADLs and improved functional ROM.   PERFORMANCE DEFICITS in functional skills including ADLs, IADLs, coordination, dexterity, tone, ROM, strength, pain, flexibility, FMC, GMC, decreased knowledge of precautions, and UE functional use, cognitive skills including learn, memory, and safety awareness.  IMPAIRMENTS are limiting patient from ADLs and IADLs.   COMORBIDITIES may have co-morbidities  that affects occupational performance. Patient will benefit from skilled OT to address above impairments and improve overall function.  MODIFICATION OR ASSISTANCE TO COMPLETE EVALUATION: Min-Moderate modification of tasks or assist with assess  necessary to complete an evaluation.  OT OCCUPATIONAL PROFILE AND HISTORY: Detailed assessment: Review of records and additional review of physical, cognitive, psychosocial history related to current functional performance.  CLINICAL DECISION MAKING: Moderate - several treatment options, min-mod task modification necessary  REHAB POTENTIAL: Fair ongoing spasticity  EVALUATION COMPLEXITY: Moderate    PLAN: OT FREQUENCY: 1x/week  OT DURATION: 12 weeks (Plan for 8 visits over 12 weeks, but asking 12 weeks to allow for time to benefit upcoming botox injections)  PLANNED INTERVENTIONS: self care/ADL training, therapeutic exercise, therapeutic activity, neuromuscular re-education, manual therapy, passive range of motion, aquatic therapy, splinting, moist heat, cryotherapy, and patient/family education  RECOMMENDED OTHER SERVICES: may benefit from aquatic therapy OT  CONSULTED AND AGREED WITH PLAN OF CARE: Patient and family member/caregiver  PLAN FOR NEXT SESSION: review HEP and begin PROM and self-ROM for shoulder and elbow ROM.   Rosalio Loud, OTR/L 03/26/2022, 1:51 PM

## 2022-03-26 NOTE — Therapy (Signed)
OUTPATIENT PHYSICAL THERAPY NEURO EVALUATION   Patient Name: KYRELL RUACHO MRN: [id number] DOB:09-Apr-1986, 36 y.o., male Today's Date: 03/26/2022   PCP: Elyn Aquas REFERRING PROVIDER: Ranelle Oyster, MD    PT End of Session - 03/26/22 0847     Visit Number 1    Number of Visits 12    Date for PT Re-Evaluation 06/18/22    Authorization Type BCBS/ Medicaid of [place] for secondary    PT Start Time 0847    PT Stop Time 0930    PT Time Calculation (min) 43 min    Equipment Utilized During Treatment Gait belt    Activity Tolerance Patient tolerated treatment well    Behavior During Therapy Impulsive   frequent redirection to task            Past Medical History:  Diagnosis Date   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HCC)    Chronic hepatitis B (HCC)    Fixed pupils    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HCC)    Hypertension    Intracranial injury of other and unspecified nature, without mention of open intracranial wound, unspecified state of consciousness    Intracranial shunt    Paralysis (HCC)    Spastic hemiplegia affecting dominant side (HCC)    Stroke (HCC)    Traumatic brain injury (HCC)    Trouble swallowing    Visual disturbance    Weakness    Past Surgical History:  Procedure Laterality Date   cramiectomy     CRANIOPLASTY     REMOVAL OF GASTROSTOMY TUBE     VENTRICULOPERITONEAL SHUNT     Patient Active Problem List   Diagnosis Date Noted   SIRS due to infectious process with acute organ dysfunction (HCC) 03/14/2021   SIRS (systemic inflammatory response syndrome) (HCC) 03/12/2021   Syncope 03/12/2021   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) 03/12/2021   Hypokalemia 03/12/2021   Lactic acidosis 03/12/2021   Mixed hyperlipidemia 04/25/2018   Seizure disorder (HCC) 04/11/2018   Mechanical low back pain 05/12/2017   Dysarthria 12/20/2015   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/20/2012   Deafness, sensorineural 04/20/2012   Buzzing in ear  04/20/2012   Leaking percutaneous endoscopic gastrostomy (PEG) tube (HCC) 02/04/2012   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 pre-existing conscious levels, sequela (HCC) 10/12/2011   Cerebral infarct (HCC) 10/12/2011   Spastic hemiplegia of right dominant side due to noncerebrovascular etiology (HCC) 10/12/2011   Dysphagia 10/12/2011   Functioning G-tube 03/24/2011   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   Hemiplegia of dominant side (HCC) 07/28/2010   CEREBRAL HEMORRHAGE 07/28/2010   PERSONAL HISTORY OF TRAUMATIC BRAIN INJURY 07/02/2010   ALLERGIC RHINITIS CAUSE UNSPECIFIED 10/25/2008   GASTROENTERITIS 08/09/2008   CONTACT DERMATITIS&OTHER ECZEMA DUE DETERGENTS 08/18/2007   HEPATITIS B, CHRONIC 06/28/2007   Chronic viral hepatitis B without delta-agent (HCC) 06/28/2007    ONSET DATE: 2012  REFERRING DIAG: G81.11 (ICD-10-CM) - Spastic hemiplegia of right dominant side due to noncerebrovascular etiology   THERAPY DIAG:  Unsteadiness on feet  Difficulty in walking, not elsewhere classified  Other abnormalities of gait and mobility  Abnormal posture  Muscle weakness (generalized)  Rationale for Evaluation and Treatment Rehabilitation  SUBJECTIVE:  SUBJECTIVE STATEMENT: Two falls in the past two months and had injury to right elbow (ruptured bursa). No AD prior. Father acting as primary historian, notes that sometimes at home walks barefoot and notes inversion of ankle (typically wears a hinged [place]/AFO). Pt is member of [employer] and father assists in aquatic fitness/exercise.  Father expresses desire for a maintenance program with skilled services.   Pt accompanied by: family member father  PERTINENT HISTORY: . History  of traumatic brain injury with hydrocephalus, meningitis,   and right pontine stroke.   2. Spastic tetraplegia, dominant side more affected 3. severe oropharyngeal dysphagia--improved--tolerating diet well.  Continues on thickened liquids 4. Absence seizures 5. Mechanical Low back pain: due to posture/altered gait patterns--improved 6. Left great toe infection--podiatry f/u  PAIN:  Are you having pain? Yes.  Pt indicates right buttock area, no pain exhibited during resisted tests and pt denies pain during functional mobility and fall risk tests.  Denies antalgia  PRECAUTIONS: Fall  WEIGHT BEARING RESTRICTIONS No  FALLS: Has patient fallen in last 6 months? Yes. Number of falls 2  LIVING ENVIRONMENT: Lives with: lives with their family Lives in: House/apartment Stairs: Yes: Internal: 12 steps; can reach both and External: 3 steps; can reach both Has following equipment at home: Grab bars and None  PLOF: Needs assistance with ADLs and Needs assistance with homemaking supervision for outdoor ambulation  PATIENT GOALS  Have a maintenance program  OBJECTIVE:   DIAGNOSTIC FINDINGS:   COGNITION: Overall cognitive status: History of cognitive impairments - at baseline   SENSATION: Not tested  COORDINATION: RLE impaired  EDEMA:  none  MUSCLE TONE: RLE: Hypertonic, no ankle clonus detected   MUSCLE LENGTH: No obvious restrictions  DTRs:    POSTURE: weight shift left  LOWER EXTREMITY ROM:     Passive  Right Eval Left Eval  Hip flexion    Hip extension    Hip abduction    Hip adduction    Hip internal rotation    Hip external rotation    Knee flexion    Knee extension    Ankle dorsiflexion -9 WNL  Ankle plantarflexion    Ankle inversion    Ankle eversion     (Blank rows = not tested)  ---father reports that they have a static, progressive (JAS) brace that they use x 30 min daily on right ankle for dorsiflexion ROM  LOWER EXTREMITY MMT:    MMT  Right Eval Left Eval  Hip flexion 5 4  Hip extension    Hip abduction 5 4  Hip adduction 5 4  Hip internal rotation    Hip external rotation    Knee flexion 5 4  Knee extension 5 4  Ankle dorsiflexion 5 4  Ankle plantarflexion    Ankle inversion 5 4  Ankle eversion 5 3  (Blank rows = not tested)  BED MOBILITY:  Independent  TRANSFERS: Assistive device utilized: None  Sit to stand: Complete Independence and Modified independence Stand to sit: Complete Independence Chair to chair: Complete Independence and Modified independence Floor:  DNT   CURB:  Level of Assistance: SBA and CGA Assistive device utilized: None Curb Comments: CGA  STAIRS:  Level of Assistance: SBA  Stair Negotiation Technique: Step to Pattern with Bilateral Rails  Number of Stairs: 6   Height of Stairs: 4-6"  Comments: right knee wobble w/ descending  GAIT: Gait pattern: trunk rotated posterior- Right, knee extensor thrust Distance walked:  Assistive device utilized: None, right ankle  hinged [place] Level of assistance: Modified independence and SBA Comments: notes right ankle inversion when walking barefoot  FUNCTIONAL TESTs:  5 times sit to stand: 19.10 sec--bias to LLE Timed up and go (TUG): DNT Berg Balance Scale: 32/56 10 meter walk test: 12.39 sec = 2.6 ft/sec  PATIENT SURVEYS:    TODAY'S TREATMENT:     PATIENT EDUCATION: Education details: regarding assessment findings and rationale of PT intervention Person educated: Patient and Parent Education method: Explanation Education comprehension: verbalized understanding   HOME EXERCISE PROGRAM: Access Code: [email or web address] URL: [email or web address] Date: 03/26/2022 Prepared by: Sherlyn Lees  Exercises - Seated Ankle Eversion with Resistance  - 1 x daily - 7 x weekly - 3 sets - 10 reps    GOALS: Goals reviewed with patient? Yes  SHORT TERM GOALS: Target date: 05/07/2022  Patient will perform HEP with  family/caregiver supervision for improved strength, balance, transfers, and gait  Baseline: Goal status: INITIAL    LONG TERM GOALS: Target date: 06/18/2022  Reduce risk for falls per score 40/56 Berg Balance Test Baseline: 32/56 Goal status: INITIAL  2.  Increase right eversion strength to 4/5 to improve ankle posture and decrease inversion when walking barefoot to reduce risk for fall/injury Baseline: 3/5 Goal status: INITIAL  3.  Improve BLE strength and balance per time of 15 sec 5xSTS to reduce risk for falls Baseline: 19+ sec with LLE bias/weight shift Goal status: INITIAL    ASSESSMENT:  CLINICAL IMPRESSION: Patient is a 36 y.o. male who was seen today for physical therapy evaluation and treatment for spastic hemiparesis and sequela affecting his functional mobility and has recent hx of multiple falls. Presents to rehab with high risk for falls per fall risk assessments and exhibits balance deficits and gait impairments in addition to generalized weakness affecting mobility.  PT services indicated to address deficits and limitations and implement comprehensive program and caregiver training to address impairments.  Patient would benefit from aquatic-based PT intervention. This approach allows for a whole-body focus, with special attention on activating and relearning proper biomechanics in injured areas. Additionally, water as a modality can both strengthen and facilitate the whole body; water's unique properties can reduce impact of exercise on joints, increase pain-free movement, improve microcirculation, and help increase muscle tone through natural resistance to improve this patient's functional mobility   OBJECTIVE IMPAIRMENTS Abnormal gait, decreased balance, decreased coordination, difficulty walking, decreased strength, improper body mechanics, and postural dysfunction.   ACTIVITY LIMITATIONS carrying, lifting, bending, squatting, stairs, and locomotion  level  PARTICIPATION LIMITATIONS: cleaning, interpersonal relationship, community activity, and yard work  PERSONAL FACTORS Time since onset of injury/illness/exacerbation and 1 comorbidity: hx of TBI  are also affecting patient's functional outcome.   REHAB POTENTIAL: Good  CLINICAL DECISION MAKING: Evolving/moderate complexity  EVALUATION COMPLEXITY: Moderate  PLAN: PT FREQUENCY: 1x/week  PT DURATION: 12 weeks  PLANNED INTERVENTIONS: Therapeutic exercises, Therapeutic activity, Neuromuscular re-education, Balance training, Gait training, Patient/Family education, Self Care, Joint mobilization, Stair training, Vestibular training, Canalith repositioning, Orthotic/Fit training, DME instructions, Aquatic Therapy, Electrical stimulation, Wheelchair mobility training, Spinal mobilization, Cryotherapy, Moist heat, Taping, Ionotophoresis 4mg /ml Dexamethasone, and Manual therapy  PLAN FOR NEXT SESSION: develop HEP, sit-stand stride stance, standing on compliant surfaces   12:57 PM, 03/26/22 M. Sherlyn Lees, PT, DPT Physical Therapist- [place] Office Number: [phone]

## 2022-04-01 ENCOUNTER — Ambulatory Visit: Payer: BC Managed Care – PPO | Admitting: Physical Medicine & Rehabilitation

## 2022-04-02 ENCOUNTER — Ambulatory Visit: Payer: BC Managed Care – PPO

## 2022-04-02 ENCOUNTER — Ambulatory Visit: Payer: BC Managed Care – PPO | Admitting: Occupational Therapy

## 2022-04-02 DIAGNOSIS — M6281 Muscle weakness (generalized): Secondary | ICD-10-CM

## 2022-04-02 DIAGNOSIS — R2689 Other abnormalities of gait and mobility: Secondary | ICD-10-CM

## 2022-04-02 DIAGNOSIS — R293 Abnormal posture: Secondary | ICD-10-CM

## 2022-04-02 DIAGNOSIS — R2681 Unsteadiness on feet: Secondary | ICD-10-CM

## 2022-04-02 DIAGNOSIS — G8111 Spastic hemiplegia affecting right dominant side: Secondary | ICD-10-CM | POA: Diagnosis not present

## 2022-04-02 DIAGNOSIS — R262 Difficulty in walking, not elsewhere classified: Secondary | ICD-10-CM

## 2022-04-02 NOTE — Therapy (Signed)
OUTPATIENT PHYSICAL THERAPY NEURO EVALUATION   Patient Name: Paul Kane MRN: [id number] DOB:[birth year], 36 y.o., male Today's Date: 04/02/2022   PCP: Elyn Aquas REFERRING PROVIDER: Ranelle Oyster, MD    PT End of Session - 04/02/22 0849     Visit Number 2    Number of Visits 12    Date for PT Re-Evaluation 06/18/22    Authorization Type BCBS/ Medicaid of [place] for secondary    Authorization - Visit Number 1    Authorization - Number of Visits 3    PT Start Time 0845    PT Stop Time 0930    PT Time Calculation (min) 45 min    Equipment Utilized During Treatment Gait belt    Activity Tolerance Patient tolerated treatment well    Behavior During Therapy Impulsive   frequent redirection to task            Past Medical History:  Diagnosis Date   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HCC)    Chronic hepatitis B (HCC)    Fixed pupils    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HCC)    Hypertension    Intracranial injury of other and unspecified nature, without mention of open intracranial wound, unspecified state of consciousness    Intracranial shunt    Paralysis (HCC)    Spastic hemiplegia affecting dominant side (HCC)    Stroke (HCC)    Traumatic brain injury (HCC)    Trouble swallowing    Visual disturbance    Weakness    Past Surgical History:  Procedure Laterality Date   cramiectomy     CRANIOPLASTY     REMOVAL OF GASTROSTOMY TUBE     VENTRICULOPERITONEAL SHUNT     Patient Active Problem List   Diagnosis Date Noted   SIRS due to infectious process with acute organ dysfunction (HCC) 03/14/2021   SIRS (systemic inflammatory response syndrome) (HCC) 03/12/2021   Syncope 03/12/2021   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) 03/12/2021   Hypokalemia 03/12/2021   Lactic acidosis 03/12/2021   Mixed hyperlipidemia 04/25/2018   Seizure disorder (HCC) 04/11/2018   Mechanical low back pain 05/12/2017   Dysarthria 12/20/2015   CD (conductive  deafness) 04/20/2012   Deafness, sensorineural 04/20/2012   Buzzing in ear 04/20/2012   Leaking percutaneous endoscopic gastrostomy (PEG) tube (HCC) 02/04/2012   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 pre-existing conscious levels, sequela (HCC) 10/12/2011   Cerebral infarct (HCC) 10/12/2011   Spastic hemiplegia of right dominant side due to noncerebrovascular etiology (HCC) 10/12/2011   Dysphagia 10/12/2011   Functioning G-tube 03/24/2011   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   Hemiplegia of dominant side (HCC) 07/28/2010   CEREBRAL HEMORRHAGE 07/28/2010   PERSONAL HISTORY OF TRAUMATIC BRAIN INJURY 07/02/2010   ALLERGIC RHINITIS CAUSE UNSPECIFIED 10/25/2008   GASTROENTERITIS 08/09/2008   CONTACT DERMATITIS&OTHER ECZEMA DUE DETERGENTS 08/18/2007   HEPATITIS B, CHRONIC 06/28/2007   Chronic viral hepatitis B without delta-agent (HCC) 06/28/2007    ONSET DATE: 2012  REFERRING DIAG: G81.11 (ICD-10-CM) - Spastic hemiplegia of right dominant side due to noncerebrovascular etiology   THERAPY DIAG:  Unsteadiness on feet  Difficulty in walking, not elsewhere classified  Other abnormalities of gait and mobility  Abnormal posture  Muscle weakness (generalized)  Rationale for Evaluation and Treatment Rehabilitation  SUBJECTIVE:  SUBJECTIVE STATEMENT: Two falls in the past two months and had injury to right elbow (ruptured bursa). No AD prior. Father acting as primary historian, notes that sometimes at home walks barefoot and notes inversion of ankle (typically wears a hinged [place]/AFO). Pt is member of [employer] and father assists in aquatic fitness/exercise.  Father expresses desire for a maintenance program with skilled services.    Pt accompanied by: family member father  PERTINENT HISTORY: . History of traumatic brain injury with hydrocephalus, meningitis,   and right pontine stroke.   2. Spastic tetraplegia, dominant side more affected 3. severe oropharyngeal dysphagia--improved--tolerating diet well.  Continues on thickened liquids 4. Absence seizures 5. Mechanical Low back pain: due to posture/altered gait patterns--improved 6. Left great toe infection--podiatry f/u  PAIN:  Are you having pain? Yes.  Pt indicates right buttock area, no pain exhibited during resisted tests and pt denies pain during functional mobility and fall risk tests.  Denies antalgia  PRECAUTIONS: Fall  WEIGHT BEARING RESTRICTIONS No  FALLS: Has patient fallen in last 6 months? Yes. Number of falls 2  LIVING ENVIRONMENT: Lives with: lives with their family Lives in: House/apartment Stairs: Yes: Internal: 12 steps; can reach both and External: 3 steps; can reach both Has following equipment at home: Grab bars and None  PLOF: Needs assistance with ADLs and Needs assistance with homemaking supervision for outdoor ambulation  PATIENT GOALS  Have a maintenance program  OBJECTIVE:   TODAY'S TREATMENT: 04/02/22 Activity Comments  Foot on step 4x15 sec LLE elevated on Bosu  Forward/backward, sidestepping, tandem walk 2x20 ft  Sit-stand stride stance 5x5 Blocking LLE , 7# dumb  Farmer's carry march unilat 7# db in left hand, 7.5# cuff weight right wrist  Standing on foam - EO/EC 2x15 sec -head turns 5x EO/EC       DIAGNOSTIC FINDINGS:   COGNITION: Overall cognitive status: History of cognitive impairments - at baseline   SENSATION: Not tested  COORDINATION: RLE impaired  EDEMA:  none  MUSCLE TONE: RLE: Hypertonic, no ankle clonus detected   MUSCLE LENGTH: No obvious restrictions  DTRs:    POSTURE: weight shift left  LOWER EXTREMITY ROM:     Passive  Right Eval Left Eval  Hip flexion    Hip extension     Hip abduction    Hip adduction    Hip internal rotation    Hip external rotation    Knee flexion    Knee extension    Ankle dorsiflexion -9 WNL  Ankle plantarflexion    Ankle inversion    Ankle eversion     (Blank rows = not tested)  ---father reports that they have a static, progressive (JAS) brace that they use x 30 min daily on right ankle for dorsiflexion ROM  LOWER EXTREMITY MMT:    MMT Right Eval Left Eval  Hip flexion 5 4  Hip extension    Hip abduction 5 4  Hip adduction 5 4  Hip internal rotation    Hip external rotation    Knee flexion 5 4  Knee extension 5 4  Ankle dorsiflexion 5 4  Ankle plantarflexion    Ankle inversion 5 4  Ankle eversion 5 3  (Blank rows = not tested)  BED MOBILITY:  Independent  TRANSFERS: Assistive device utilized: None  Sit to stand: Complete Independence and Modified independence Stand to sit: Complete Independence Chair to chair: Complete Independence and Modified independence Floor:  DNT   CURB:  Level of Assistance:  SBA and CGA Assistive device utilized: None Curb Comments: CGA  STAIRS:  Level of Assistance: SBA  Stair Negotiation Technique: Step to Pattern with Bilateral Rails  Number of Stairs: 6   Height of Stairs: 4-6"  Comments: right knee wobble w/ descending  GAIT: Gait pattern: trunk rotated posterior- Right, knee extensor thrust Distance walked:  Assistive device utilized: None, right ankle hinged [place] Level of assistance: Modified independence and SBA Comments: notes right ankle inversion when walking barefoot  FUNCTIONAL TESTs:  5 times sit to stand: 19.10 sec--bias to LLE Timed up and go (TUG): DNT Berg Balance Scale: 32/56 10 meter walk test: 12.39 sec = 2.6 ft/sec  PATIENT SURVEYS:    TODAY'S TREATMENT:     PATIENT EDUCATION: Education details: regarding assessment findings and rationale of PT intervention Person educated: Patient and Parent Education method: Explanation Education  comprehension: verbalized understanding   HOME EXERCISE PROGRAM: Access Code: [email or web address] URL: [email or web address] Date: 03/26/2022 Prepared by: Shary Decamp  Exercises - Seated Ankle Eversion with Resistance  - 1 x daily - 7 x weekly - 3 sets - 10 reps    GOALS: Goals reviewed with patient? Yes  SHORT TERM GOALS: Target date: 05/07/2022  Patient will perform HEP with family/caregiver supervision for improved strength, balance, transfers, and gait  Baseline: Goal status: INITIAL    LONG TERM GOALS: Target date: 06/18/2022  Reduce risk for falls per score 40/56 Berg Balance Test Baseline: 32/56 Goal status: INITIAL  2.  Increase right eversion strength to 4/5 to improve ankle posture and decrease inversion when walking barefoot to reduce risk for fall/injury Baseline: 3/5 Goal status: INITIAL  3.  Improve BLE strength and balance per time of 15 sec 5xSTS to reduce risk for falls Baseline: 19+ sec with LLE bias/weight shift Goal status: INITIAL    ASSESSMENT:  CLINICAL IMPRESSION: Tx initiated w/ techniques to improve and force weight acceptance RLE for stabilization with therapist facilitating and coordinating right knee extension and neutral hip alignment to improve mechanics. Techniques to enhance weight acceptance right knee via stride stance with blocking LLE to prevent compensation.  Progressing with balance on compliant surface requiring wide BOS but able to initiate eyes closed and head turns. Difficulty with tandem walk and increased LOB with narrow BOS and/or crossing midline.  Demo right knee extensor thrust at midstance, discussed with caregiver regarding gait mechanics and possible bracing option. Continued sessions to progress balance and reduce risk for falls   OBJECTIVE IMPAIRMENTS Abnormal gait, decreased balance, decreased coordination, difficulty walking, decreased strength, improper body mechanics, and postural dysfunction.   ACTIVITY  LIMITATIONS carrying, lifting, bending, squatting, stairs, and locomotion level  PARTICIPATION LIMITATIONS: cleaning, interpersonal relationship, community activity, and yard work  PERSONAL FACTORS Time since onset of injury/illness/exacerbation and 1 comorbidity: hx of TBI  are also affecting patient's functional outcome.   REHAB POTENTIAL: Good  CLINICAL DECISION MAKING: Evolving/moderate complexity  EVALUATION COMPLEXITY: Moderate  PLAN: PT FREQUENCY: 1x/week  PT DURATION: 12 weeks  PLANNED INTERVENTIONS: Therapeutic exercises, Therapeutic activity, Neuromuscular re-education, Balance training, Gait training, Patient/Family education, Self Care, Joint mobilization, Stair training, Vestibular training, Canalith repositioning, Orthotic/Fit training, DME instructions, Aquatic Therapy, Electrical stimulation, Wheelchair mobility training, Spinal mobilization, Cryotherapy, Moist heat, Taping, Ionotophoresis 4mg /ml Dexamethasone, and Manual therapy  PLAN FOR NEXT SESSION: develop HEP, sit-stand stride stance, standing on compliant surfaces   8:53 AM, 04/02/22 M. 04/04/22, PT, DPT Physical Therapist- [place] Office Number: [phone]

## 2022-04-08 ENCOUNTER — Encounter: Payer: Self-pay | Admitting: Physical Medicine & Rehabilitation

## 2022-04-08 ENCOUNTER — Encounter
Payer: BC Managed Care – PPO | Attending: Physical Medicine & Rehabilitation | Admitting: Physical Medicine & Rehabilitation

## 2022-04-08 VITALS — BP 104/68 | HR 65 | Ht 67.0 in | Wt 159.8 lb

## 2022-04-08 DIAGNOSIS — G8111 Spastic hemiplegia affecting right dominant side: Secondary | ICD-10-CM | POA: Insufficient documentation

## 2022-04-08 MED ORDER — INCOBOTULINUMTOXINA 100 UNITS IM SOLR
500.0000 [IU] | Freq: Once | INTRAMUSCULAR | Status: AC
  Administered 2022-04-08: 500 [IU] via INTRAMUSCULAR

## 2022-04-08 NOTE — Progress Notes (Signed)
Xeomin  Injection for spasticity using needle EMG guidance Indication: Spastic hemiplegia of right dominant side due to noncerebrovascular etiology ([place]) - Plan: incobotulinumtoxinA (XEOMIN) 100 units injection 500 Units   Dilution: 100 Units/ml        Total Units Injected: 500 Indication: Severe spasticity which interferes with ADL,mobility and/or  hygiene and is unresponsive to medication management and other conservative care Informed consent was obtained after describing risks and benefits of the procedure with the patient. This includes bleeding, bruising, infection, excessive weakness, or medication side effects. A REMS form is on file and signed.  right Needle: 23mm injectable monopolar needle electrode  Number of units per muscle Pectoralis Major 0 units Pectoralis Minor 0 units Biceps 150 units Brachioradialis 50 units FCR 0 units FCU 0 units FDS 0 units FDP 0 units FPL 0 units Pronator Teres 0 units Pronator Quadratus 0 units Lumbricals 0 units Quadriceps 0 units Gastroc/soleus 200 units Hamstrings 0 units Tibialis Posterior 100 units Tibialis Anterior 0 units EHL 0 units All injections were done after obtaining appropriate EMG activity and after negative drawback for blood. The patient tolerated the procedure well. Post procedure instructions were given. Return in about 3 months (around 07/08/2022). For regular f/u visit.

## 2022-04-08 NOTE — Patient Instructions (Signed)
PLEASE FEEL FREE TO CALL OUR OFFICE WITH ANY PROBLEMS OR QUESTIONS ([phone])      

## 2022-04-09 ENCOUNTER — Ambulatory Visit: Payer: BC Managed Care – PPO

## 2022-04-09 ENCOUNTER — Ambulatory Visit: Payer: BC Managed Care – PPO | Admitting: Occupational Therapy

## 2022-04-09 DIAGNOSIS — M6281 Muscle weakness (generalized): Secondary | ICD-10-CM

## 2022-04-09 DIAGNOSIS — R293 Abnormal posture: Secondary | ICD-10-CM

## 2022-04-09 DIAGNOSIS — G8111 Spastic hemiplegia affecting right dominant side: Secondary | ICD-10-CM | POA: Diagnosis not present

## 2022-04-09 DIAGNOSIS — M25621 Stiffness of right elbow, not elsewhere classified: Secondary | ICD-10-CM

## 2022-04-09 DIAGNOSIS — M25611 Stiffness of right shoulder, not elsewhere classified: Secondary | ICD-10-CM

## 2022-04-09 DIAGNOSIS — M25641 Stiffness of right hand, not elsewhere classified: Secondary | ICD-10-CM

## 2022-04-09 DIAGNOSIS — R2689 Other abnormalities of gait and mobility: Secondary | ICD-10-CM

## 2022-04-09 DIAGNOSIS — R262 Difficulty in walking, not elsewhere classified: Secondary | ICD-10-CM

## 2022-04-09 DIAGNOSIS — R2681 Unsteadiness on feet: Secondary | ICD-10-CM

## 2022-04-09 NOTE — Therapy (Signed)
OUTPATIENT OCCUPATIONAL THERAPY  Treatment Note  Patient Name: Paul HumbleHayri E Doolin MRN: [id number] DOB:07-20-1986, 36 y.o., male Today's Date: 04/09/2022  PCP: Bailey MechPodraza, Cole Christopher, PA- C REFERRING PROVIDER: Ranelle OysterSwartz, Zachary T, MD   OT End of Session - 04/09/22 1202     Visit Number 2    Number of Visits 9    Date for OT Re-Evaluation 06/19/22    Authorization Type BCBS primary/ [place] Medicaid secondary    OT Start Time 1017    OT Stop Time 1059    OT Time Calculation (min) 42 min              Past Medical History:  Diagnosis Date   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HCC)    Chronic hepatitis B (HCC)    Fixed pupils    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HCC)    Hypertension    Intracranial injury of other and unspecified nature, without mention of open intracranial wound, unspecified state of consciousness    Intracranial shunt    Paralysis (HCC)    Spastic hemiplegia affecting dominant side (HCC)    Stroke (HCC)    Traumatic brain injury (HCC)    Trouble swallowing    Visual disturbance    Weakness    Past Surgical History:  Procedure Laterality Date   cramiectomy     CRANIOPLASTY     REMOVAL OF GASTROSTOMY TUBE     VENTRICULOPERITONEAL SHUNT     Patient Active Problem List   Diagnosis Date Noted   SIRS due to infectious process with acute organ dysfunction (HCC) 03/14/2021   SIRS (systemic inflammatory response syndrome) (HCC) 03/12/2021   Syncope 03/12/2021   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) 03/12/2021   Hypokalemia 03/12/2021   Lactic acidosis 03/12/2021   Mixed hyperlipidemia 04/25/2018   Seizure disorder (HCC) 04/11/2018   Mechanical low back pain 05/12/2017   Dysarthria 12/20/2015   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/20/2012   Deafness, sensorineural 04/20/2012   Buzzing in ear 04/20/2012   Leaking percutaneous endoscopic gastrostomy (PEG) tube (HCC) 02/04/2012   Diffuse traumatic brain injury with loss of consciousness greater than  24 hours with return to pre-existing conscious levels, sequela (HCC) 10/12/2011   Cerebral infarct (HCC) 10/12/2011   Spastic hemiplegia of right dominant side due to noncerebrovascular etiology (HCC) 10/12/2011   Dysphagia 10/12/2011   Functioning G-tube 03/24/2011   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   Hemiplegia of dominant side (HCC) 07/28/2010   CEREBRAL HEMORRHAGE 07/28/2010   PERSONAL HISTORY OF TRAUMATIC BRAIN INJURY 07/02/2010   ALLERGIC RHINITIS CAUSE UNSPECIFIED 10/25/2008   GASTROENTERITIS 08/09/2008   CONTACT DERMATITIS&OTHER ECZEMA DUE DETERGENTS 08/18/2007   HEPATITIS B, CHRONIC 06/28/2007   Chronic viral hepatitis B without delta-agent (HCC) 06/28/2007    ONSET DATE: 03/04/22 (referral date)  REFERRING DIAG: G81.11 (ICD-10-CM) - Spastic hemiplegia of right dominant side due to noncerebrovascular etiology   THERAPY DIAG:  Spastic hemiplegia affecting right dominant side, unspecified etiology (HCC)  Muscle weakness (generalized)  Stiffness of right shoulder, not elsewhere classified  Stiffness of right elbow, not elsewhere classified  Stiffness of right hand, not elsewhere classified  Rationale for Evaluation and Treatment Rehabilitation  SUBJECTIVE:   SUBJECTIVE STATEMENT: Pt's father reports that pt has a sore on his R ankle most likely due to inversion and possible bone growth.  Pt's father also reports that he received botox on his arm yesterday.  Pt accompanied by: self and family member (dad)  PERTINENT HISTORY: 1. History of traumatic brain injury with  hydrocephalus, meningitis,   and right pontine stroke (2011) 2. Spastic tetraplegia, dominant side more affected 3. severe oropharyngeal dysphagia--improved--tolerating diet well.  Continues on thickened liquids 4. Absence seizures 5. Mechanical Low back pain: due to posture/altered gait patterns--improved 6. Left great toe infection--podiatry f/u    PRECAUTIONS: Fall and Other: h/o  seizures  WEIGHT BEARING RESTRICTIONS No  PAIN:  Are you having pain? No and does report pain in R elbow when he touches it, but does not hurt at rest  FALLS: Has patient fallen in last 6 months? Yes. Number of falls 4-5  LIVING ENVIRONMENT: Lives with: lives with their family Lives in: House/apartment Stairs: Yes: Internal: full flight to 2nd floor, but bedroom and bathroom on main floor and does not need to go up steps; can reach both and External: 3 steps; can reach both Has following equipment at home: shower chair and Grab bars  PLOF: Needs assistance with ADLs  PATIENT GOALS "arm" stronger and flexible  OBJECTIVE:   HAND DOMINANCE: Right  ADLs: Mobility: Mod I around the house, hand held assist when in the community Grooming: Mod I  UB Dressing: Mod I LB Dressing: family assists with donning AFO and tying shoes Toileting: Mod I - utilizes bidet for hygiene Bathing: family assists with bathing, he completes ~90% Tub Shower transfers: Supervision/assist with shower transfers Equipment: Shower seat with back, Grab bars, and Walk in shower  UPPER EXTREMITY ROM     Active ROM Right eval Left eval  Shoulder flexion 106   Shoulder abduction    Shoulder adduction    Shoulder extension    Shoulder internal rotation    Shoulder external rotation    Elbow flexion 123   Elbow extension -34   Wrist flexion 12   Wrist extension -12   Wrist ulnar deviation    Wrist radial deviation    Wrist pronation    Wrist supination    (Blank rows = not tested)  Passive ROM Right eval Left eval  Shoulder flexion 110   Shoulder abduction    Shoulder adduction    Shoulder extension    Shoulder internal rotation    Shoulder external rotation    Elbow flexion 143   Elbow extension -32   Wrist flexion 40   Wrist extension 34   Wrist ulnar deviation    Wrist radial deviation    Wrist pronation    Wrist supination    (Blank rows = not tested)  HAND FUNCTION: Pt is able to  manually open hand with great effort  MUSCLE TONE: RUE: Moderate, Hypertonic, and Modifed Ashworth Scale 3 = Considerable increase in muscle tone, passive movement difficult  COGNITION: Overall cognitive status: Impaired, History of cognitive impairments - at baseline, and father present providing majority of information --------------------------------------------------------------------------------------------------------------------------------------------------------  TODAY'S TREATMENT:  Reviewed initial HEP from evaluation: OT providing hand over hand initially to facilitate increased ROM and elbow extension. Engaging in elbow extension and shoulder flexion with use of SPC with hand placement on handle for improved positioning. Saebo glide: Closed-chain AAROM in 45* angle to facilitate shoulder flexion/extension.  OT providing hand over hand to maintain grasp on saebo glide due to tone in wrist and hand. Pt demonstrating decreased supination to obtain and maintain grasp during shoulder flexion/extension activity. OT providing facilitation and mod cues for technique to further facilitate shoulder flexion without compensatory trunk movements. Shoulder/elbow ROM: OT instructed in new exercises with focus on further facilitation of shoulder ROM and elbow ROM.  Engaged in  chest press with SPC with focus on improved symmetry with elbow extension and shoulder flexion.  Therapist providing cues and weighting to [facility] on L to facilitate improved symmetry and assist with motor control.  Engaged in horizontal abduction/adduction and external rotation with dowel.  Pt requiring mod assist from therapist for improved technique, frequently attempting to incorporate compensatory strategies with weight shifting and leaning.  Therapist providing intermittent tactile and verbal cues for attempts at full range and increased sustained stretch when possible.    03/26/22 PROM to R shoulder and elbow with focus on  increased ROM.   Therapist instructed in shoulder ROM and elbow extension with SPC as dowel with focus on elbow flexion/extension and shoulder circles to facilitate increased shoulder flexion/extension.    PATIENT EDUCATION: Education details: Estate manager/land agent and hand over hand assist from family member to assist with proper technique for HEP Person educated: Patient and Parent Education method: Explanation, Demonstration, Actor cues, and Handouts Education comprehension: needs further education   HOME EXERCISE PROGRAM: Access Code: [email or web address] URL: [email or web address] Date: 04/09/2022 Prepared by: [facility] - Outpatient  Rehab - [facility]  Exercises - Seated Shoulder Circles AAROM with Dowel into Wall  - 1 x daily - 2 sets - 10 reps - Seated Shoulder Flexion Extension AAROM with Dowel into Wall  - 1 x daily - 2 sets - 10 reps - Seated Chest Press with Bar  - 1 x daily - 2 sets - 10 reps - Seated Shoulder External Rotation AAROM with Cane and Hand in Neutral  - 1 x daily - 2 sets - 10 reps    GOALS: Goals reviewed with patient? Yes  SHORT TERM GOALS: Target date: 04/24/22  Pt and caregiver will be independent with HEP for shoulder and elbow ROM. Baseline: Pt not currently engaging in any ROM/stretching program Goal status: IN PROGRESS  2.  Pt will demonstrate improved active elbow extension to -30* to increase functional use Baseline: -34* Goal status: IN PROGRESS  3.  Pt will demonstrate improved shoulder flexion to 110* to increase functional use Baseline: 106* Goal status: IN PROGRESS   LONG TERM GOALS: Target date: 06/19/22  Pt will tolerate advanced HEP/stretching program for RUE. Baseline: No HEP at this time Goal status: IN PROGRESS  2.  Pt will demonstrate improved active elbow extension to -20* to increase functional use Baseline: -34* Goal status: IN PROGRESS  3.  Pt will demonstrate improved shoulder flexion to 115* to allow for  improved ease with ADLs. Baseline: 106* Goal status: IN PROGRESS  4.  Pt will be independent with splint wear and care PRN Baseline: splinting needs TBD Goal status: IN PROGRESS  ASSESSMENT:  CLINICAL IMPRESSION: Patient seen for first treatment session s/p initial evaluation.  Therapist discussed established evaluation results and established goals, pt's father reports understanding. Pt benefiting from hand over hand to maintain grasp and proper placement of RUE on dowel due to tone in wrist and fingers limiting sustained grasp.  Pt tolerating ROM and stretch with no reports of pain.  PERFORMANCE DEFICITS in functional skills including ADLs, IADLs, coordination, dexterity, tone, ROM, strength, pain, flexibility, FMC, GMC, decreased knowledge of precautions, and UE functional use, cognitive skills including learn, memory, and safety awareness.  IMPAIRMENTS are limiting patient from ADLs and IADLs.   COMORBIDITIES may have co-morbidities  that affects occupational performance. Patient will benefit from skilled OT to address above impairments and improve overall function.  MODIFICATION OR ASSISTANCE TO COMPLETE EVALUATION: Min-Moderate modification of tasks or  assist with assess necessary to complete an evaluation.  OT OCCUPATIONAL PROFILE AND HISTORY: Detailed assessment: Review of records and additional review of physical, cognitive, psychosocial history related to current functional performance.  CLINICAL DECISION MAKING: Moderate - several treatment options, min-mod task modification necessary  REHAB POTENTIAL: Fair ongoing spasticity  EVALUATION COMPLEXITY: Moderate    PLAN: OT FREQUENCY: 1x/week  OT DURATION: 12 weeks (Plan for 8 visits over 12 weeks, but asking 12 weeks to allow for time to benefit upcoming botox injections)  PLANNED INTERVENTIONS: self care/ADL training, therapeutic exercise, therapeutic activity, neuromuscular re-education, manual therapy, passive range of  motion, aquatic therapy, splinting, moist heat, cryotherapy, and patient/family education  RECOMMENDED OTHER SERVICES: may benefit from aquatic therapy OT  CONSULTED AND AGREED WITH PLAN OF CARE: Patient and family member/caregiver  PLAN FOR NEXT SESSION: review HEP and begin PROM and self-ROM for shoulder and elbow ROM.   Simonne Come, OTR/L 04/09/2022, 12:03 PM

## 2022-04-09 NOTE — Therapy (Signed)
OUTPATIENT PHYSICAL THERAPY NEURO EVALUATION   Patient Name: Paul Kane MRN: [id number] DOB:01-08-1986, 36 y.o., male Today's Date: 04/09/2022   PCP: Caswell Corwin REFERRING PROVIDER: Meredith Staggers, MD    PT End of Session - 04/09/22 [phone]     Visit Number 3    Number of Visits 12    Date for PT Re-Evaluation 06/18/22    Authorization Type BCBS/ Medicaid of [place] for secondary    Authorization - Visit Number 2    Authorization - Number of Visits 3    PT Start Time 0935    PT Stop Time 1015    PT Time Calculation (min) 40 min    Equipment Utilized During Treatment Gait belt    Activity Tolerance Patient tolerated treatment well    Behavior During Therapy Impulsive   frequent redirection to task            Past Medical History:  Diagnosis Date   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[place])    Chronic hepatitis B ([place])    Fixed pupils    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[place])    Hypertension    Intracranial injury of other and unspecified nature, without mention of open intracranial wound, unspecified state of consciousness    Intracranial shunt    Paralysis ([place])    Spastic hemiplegia affecting dominant side ([place])    Stroke ([place])    Traumatic brain injury ([place])    Trouble swallowing    Visual disturbance    Weakness    Past Surgical History:  Procedure Laterality Date   cramiectomy     CRANIOPLASTY     REMOVAL OF GASTROSTOMY TUBE     VENTRICULOPERITONEAL SHUNT     Patient Active Problem List   Diagnosis Date Noted   SIRS due to infectious process with acute organ dysfunction ([place]) 03/14/2021   SIRS (systemic inflammatory response syndrome) ([place]) 03/12/2021   Syncope 03/12/2021   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[place]) 03/12/2021   Hypokalemia 03/12/2021   Lactic acidosis 03/12/2021   Mixed hyperlipidemia 04/25/2018   Seizure disorder ([place]) 04/11/2018   Mechanical low back pain 05/12/2017   Dysarthria 12/20/2015   CD (conductive  deafness) 04/20/2012   Deafness, sensorineural 04/20/2012   Buzzing in ear 04/20/2012   Leaking percutaneous endoscopic gastrostomy (PEG) tube ([place]) 02/04/2012   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 pre-existing conscious levels, sequela ([place]) 10/12/2011   Cerebral infarct ([place]) 10/12/2011   Spastic hemiplegia of right dominant side due to noncerebrovascular etiology ([place]) 10/12/2011   Dysphagia 10/12/2011   Functioning G-tube 03/24/2011   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   Hemiplegia of dominant side ([place]) 07/28/2010   CEREBRAL HEMORRHAGE 07/28/2010   PERSONAL HISTORY OF TRAUMATIC BRAIN INJURY 07/02/2010   ALLERGIC RHINITIS CAUSE UNSPECIFIED 10/25/2008   GASTROENTERITIS 08/09/2008   CONTACT DERMATITIS&OTHER ECZEMA DUE DETERGENTS 08/18/2007   HEPATITIS B, CHRONIC 06/28/2007   Chronic viral hepatitis B without delta-agent ([place]) 06/28/2007    ONSET DATE: 2012  REFERRING DIAG: G81.11 (ICD-10-CM) - Spastic hemiplegia of right dominant side due to noncerebrovascular etiology   THERAPY DIAG:  Unsteadiness on feet  Difficulty in walking, not elsewhere classified  Other abnormalities of gait and mobility  Abnormal posture  Muscle weakness (generalized)  Rationale for Evaluation and Treatment Rehabilitation  SUBJECTIVE:  SUBJECTIVE STATEMENT: Has a sore on right lateral foot that is painful  Pt accompanied by: family member father  PERTINENT HISTORY: . History of traumatic brain injury with hydrocephalus, meningitis,   and right pontine stroke.   2. Spastic tetraplegia, dominant side more affected 3. severe oropharyngeal dysphagia--improved--tolerating diet well.  Continues on thickened liquids 4. Absence seizures 5. Mechanical  Low back pain: due to posture/altered gait patterns--improved 6. Left great toe infection--podiatry f/u  PAIN:  Are you having pain? Yes.  Pt indicates right buttock area, no pain exhibited during resisted tests and pt denies pain during functional mobility and fall risk tests.  Denies antalgia  PRECAUTIONS: Fall  WEIGHT BEARING RESTRICTIONS No  FALLS: Has patient fallen in last 6 months? Yes. Number of falls 2  LIVING ENVIRONMENT: Lives with: lives with their family Lives in: House/apartment Stairs: Yes: Internal: 12 steps; can reach both and External: 3 steps; can reach both Has following equipment at home: Grab bars and None  PLOF: Needs assistance with ADLs and Needs assistance with homemaking supervision for outdoor ambulation  PATIENT GOALS  Have a maintenance program  OBJECTIVE:   TODAY'S TREATMENT: 04/09/22 Activity Comments  Foot assessment  Right lateral foot along 5th ray demo circular skin sore that looks like a blister in origin.  Note a degree of bony hypertrophy present underlying this area which may be from chronic foot posturing in inversion/supination  Standing on foam -3x30 sec EO/EC therapist stabilizing RLE for plantigrade -LLE stair taps 2x10  Pt and caregiver education Regarding brace and contact points. Use of JAS brace to increase/progress right ankle DF in the next couple of weeks as his right gastroc recently had Botox applied.              TODAY'S TREATMENT: 04/02/22 Activity Comments  Foot on step 4x15 sec LLE elevated on Bosu  Forward/backward, sidestepping, tandem walk 2x20 ft  Sit-stand stride stance 5x5 Blocking LLE , 7# dumb  Farmer's carry march unilat 7# db in left hand, 7.5# cuff weight right wrist  Standing on foam - EO/EC 2x15 sec -head turns 5x EO/EC       DIAGNOSTIC FINDINGS:   COGNITION: Overall cognitive status: History of cognitive impairments - at baseline   SENSATION: Not tested  COORDINATION: RLE impaired  EDEMA:   none  MUSCLE TONE: RLE: Hypertonic, no ankle clonus detected   MUSCLE LENGTH: No obvious restrictions  DTRs:    POSTURE: weight shift left  LOWER EXTREMITY ROM:     Passive  Right Eval Left Eval  Hip flexion    Hip extension    Hip abduction    Hip adduction    Hip internal rotation    Hip external rotation    Knee flexion    Knee extension    Ankle dorsiflexion -9 WNL  Ankle plantarflexion    Ankle inversion    Ankle eversion     (Blank rows = not tested)  ---father reports that they have a static, progressive (JAS) brace that they use x 30 min daily on right ankle for dorsiflexion ROM  LOWER EXTREMITY MMT:    MMT Right Eval Left Eval  Hip flexion 5 4  Hip extension    Hip abduction 5 4  Hip adduction 5 4  Hip internal rotation    Hip external rotation    Knee flexion 5 4  Knee extension 5 4  Ankle dorsiflexion 5 4  Ankle plantarflexion    Ankle inversion 5 4  Ankle eversion 5 3  (Blank rows = not tested)  BED MOBILITY:  Independent  TRANSFERS: Assistive device utilized: None  Sit to stand: Complete Independence and Modified independence Stand to sit: Complete Independence Chair to chair: Complete Independence and Modified independence Floor:  DNT   CURB:  Level of Assistance: SBA and CGA Assistive device utilized: None Curb Comments: CGA  STAIRS:  Level of Assistance: SBA  Stair Negotiation Technique: Step to Pattern with Bilateral Rails  Number of Stairs: 6   Height of Stairs: 4-6"  Comments: right knee wobble w/ descending  GAIT: Gait pattern: trunk rotated posterior- Right, knee extensor thrust Distance walked:  Assistive device utilized: None, right ankle hinged [place] Level of assistance: Modified independence and SBA Comments: notes right ankle inversion when walking barefoot  FUNCTIONAL TESTs:  5 times sit to stand: 19.10 sec--bias to LLE Timed up and go (TUG): DNT Berg Balance Scale: 32/56 10 meter walk test: 12.39  sec = 2.6 ft/sec  PATIENT SURVEYS:    TODAY'S TREATMENT:     PATIENT EDUCATION: Education details: regarding assessment findings and rationale of PT intervention Person educated: Patient and Parent Education method: Explanation Education comprehension: verbalized understanding   HOME EXERCISE PROGRAM: Access Code: [email or web address] URL: [email or web address] Date: 03/26/2022 Prepared by: Sherlyn Lees  Exercises - Seated Ankle Eversion with Resistance  - 1 x daily - 7 x weekly - 3 sets - 10 reps    GOALS: Goals reviewed with patient? Yes  SHORT TERM GOALS: Target date: 05/07/2022  Patient will perform HEP with family/caregiver supervision for improved strength, balance, transfers, and gait  Baseline: Goal status: INITIAL    LONG TERM GOALS: Target date: 06/18/2022  Reduce risk for falls per score 40/56 Berg Balance Test Baseline: 32/56 Goal status: INITIAL  2.  Increase right eversion strength to 4/5 to improve ankle posture and decrease inversion when walking barefoot to reduce risk for fall/injury Baseline: 3/5 Goal status: INITIAL  3.  Improve BLE strength and balance per time of 15 sec 5xSTS to reduce risk for falls Baseline: 19+ sec with LLE bias/weight shift Goal status: INITIAL    ASSESSMENT:  CLINICAL IMPRESSION: Therapist assess sore/skin on right lateral foot. Blister/superficial cleansed with alcohol pad and dressed with two band-aids. Brace re-applied but reports continued pain at the site, thus, activities performed in socked feet and therapist stabilizing right ankle to limit tendency for inversion/supination with emphasis on proprioception/postural awareness.  Continued sessions to progress POC details and achieve STG/LTG and facilitate return to typical level of fitness facility activities.    OBJECTIVE IMPAIRMENTS Abnormal gait, decreased balance, decreased coordination, difficulty walking, decreased strength, improper body mechanics,  and postural dysfunction.   ACTIVITY LIMITATIONS carrying, lifting, bending, squatting, stairs, and locomotion level  PARTICIPATION LIMITATIONS: cleaning, interpersonal relationship, community activity, and yard work  PERSONAL FACTORS Time since onset of injury/illness/exacerbation and 1 comorbidity: hx of TBI  are also affecting patient's functional outcome.   REHAB POTENTIAL: Good  CLINICAL DECISION MAKING: Evolving/moderate complexity  EVALUATION COMPLEXITY: Moderate  PLAN: PT FREQUENCY: 1x/week  PT DURATION: 12 weeks  PLANNED INTERVENTIONS: Therapeutic exercises, Therapeutic activity, Neuromuscular re-education, Balance training, Gait training, Patient/Family education, Self Care, Joint mobilization, Stair training, Vestibular training, Canalith repositioning, Orthotic/Fit training, DME instructions, Aquatic Therapy, Electrical stimulation, Wheelchair mobility training, Spinal mobilization, Cryotherapy, Moist heat, Taping, Ionotophoresis 4mg /ml Dexamethasone, and Manual therapy  PLAN FOR NEXT SESSION: develop HEP, sit-stand stride stance, standing on compliant surfaces   9:39 AM, 04/09/22 M. Sherlyn Lees, PT, DPT Physical  Therapist- [place] Office Number: [phone]

## 2022-04-16 ENCOUNTER — Ambulatory Visit: Payer: BC Managed Care – PPO | Admitting: Occupational Therapy

## 2022-04-16 ENCOUNTER — Ambulatory Visit: Payer: BC Managed Care – PPO

## 2022-04-16 DIAGNOSIS — G8111 Spastic hemiplegia affecting right dominant side: Secondary | ICD-10-CM | POA: Diagnosis not present

## 2022-04-16 DIAGNOSIS — M25621 Stiffness of right elbow, not elsewhere classified: Secondary | ICD-10-CM

## 2022-04-16 DIAGNOSIS — R293 Abnormal posture: Secondary | ICD-10-CM

## 2022-04-16 DIAGNOSIS — R262 Difficulty in walking, not elsewhere classified: Secondary | ICD-10-CM

## 2022-04-16 DIAGNOSIS — M6281 Muscle weakness (generalized): Secondary | ICD-10-CM

## 2022-04-16 DIAGNOSIS — R2681 Unsteadiness on feet: Secondary | ICD-10-CM

## 2022-04-16 DIAGNOSIS — M25611 Stiffness of right shoulder, not elsewhere classified: Secondary | ICD-10-CM

## 2022-04-16 DIAGNOSIS — M25641 Stiffness of right hand, not elsewhere classified: Secondary | ICD-10-CM

## 2022-04-16 DIAGNOSIS — R2689 Other abnormalities of gait and mobility: Secondary | ICD-10-CM

## 2022-04-16 NOTE — Therapy (Signed)
OUTPATIENT PHYSICAL THERAPY NEURO EVALUATION   Patient Name: Paul Kane MRN: [id number] DOB:[birth year], 36 y.o., male Today's Date: 04/16/2022   PCP: Caswell Corwin REFERRING PROVIDER: Meredith Staggers, MD    PT End of Session - 04/16/22 0806     Visit Number 4    Number of Visits 12    Date for PT Re-Evaluation 06/18/22    Authorization Type BCBS/ Medicaid of [place] for secondary    Authorization - Visit Number 3    Authorization - Number of Visits 3    PT Start Time 0805    PT Stop Time 0845    PT Time Calculation (min) 40 min    Equipment Utilized During Treatment Gait belt    Activity Tolerance Patient tolerated treatment well    Behavior During Therapy Impulsive   frequent redirection to task            Past Medical History:  Diagnosis Date   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[place])    Chronic hepatitis B ([place])    Fixed pupils    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[place])    Hypertension    Intracranial injury of other and unspecified nature, without mention of open intracranial wound, unspecified state of consciousness    Intracranial shunt    Paralysis ([place])    Spastic hemiplegia affecting dominant side ([place])    Stroke ([place])    Traumatic brain injury ([place])    Trouble swallowing    Visual disturbance    Weakness    Past Surgical History:  Procedure Laterality Date   cramiectomy     CRANIOPLASTY     REMOVAL OF GASTROSTOMY TUBE     VENTRICULOPERITONEAL SHUNT     Patient Active Problem List   Diagnosis Date Noted   SIRS due to infectious process with acute organ dysfunction ([place]) 03/14/2021   SIRS (systemic inflammatory response syndrome) ([place]) 03/12/2021   Syncope 03/12/2021   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[place]) 03/12/2021   Hypokalemia 03/12/2021   Lactic acidosis 03/12/2021   Mixed hyperlipidemia 04/25/2018   Seizure disorder ([place]) 04/11/2018   Mechanical low back pain 05/12/2017   Dysarthria 12/20/2015   CD (conductive  deafness) 04/20/2012   Deafness, sensorineural 04/20/2012   Buzzing in ear 04/20/2012   Leaking percutaneous endoscopic gastrostomy (PEG) tube ([place]) 02/04/2012   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 pre-existing conscious levels, sequela ([place]) 10/12/2011   Cerebral infarct ([place]) 10/12/2011   Spastic hemiplegia of right dominant side due to noncerebrovascular etiology ([place]) 10/12/2011   Dysphagia 10/12/2011   Functioning G-tube 03/24/2011   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   Hemiplegia of dominant side ([place]) 07/28/2010   CEREBRAL HEMORRHAGE 07/28/2010   PERSONAL HISTORY OF TRAUMATIC BRAIN INJURY 07/02/2010   ALLERGIC RHINITIS CAUSE UNSPECIFIED 10/25/2008   GASTROENTERITIS 08/09/2008   CONTACT DERMATITIS&OTHER ECZEMA DUE DETERGENTS 08/18/2007   HEPATITIS B, CHRONIC 06/28/2007   Chronic viral hepatitis B without delta-agent ([place]) 06/28/2007    ONSET DATE: 2012  REFERRING DIAG: G81.11 (ICD-10-CM) - Spastic hemiplegia of right dominant side due to noncerebrovascular etiology   THERAPY DIAG:  Unsteadiness on feet  Difficulty in walking, not elsewhere classified  Other abnormalities of gait and mobility  Abnormal posture  Muscle weakness (generalized)  Rationale for Evaluation and Treatment Rehabilitation  SUBJECTIVE:  SUBJECTIVE STATEMENT: Foot is feeling better and able to tolerate wearing brace/shoe  Pt accompanied by: family member father  PERTINENT HISTORY: . History of traumatic brain injury with hydrocephalus, meningitis,   and right pontine stroke.   2. Spastic tetraplegia, dominant side more affected 3. severe oropharyngeal dysphagia--improved--tolerating diet well.  Continues on thickened liquids 4. Absence seizures 5.  Mechanical Low back pain: due to posture/altered gait patterns--improved 6. Left great toe infection--podiatry f/u  PAIN:  Are you having pain? Yes.  Pt indicates right buttock area, no pain exhibited during resisted tests and pt denies pain during functional mobility and fall risk tests.  Denies antalgia  PRECAUTIONS: Fall  WEIGHT BEARING RESTRICTIONS No  FALLS: Has patient fallen in last 6 months? Yes. Number of falls 2  LIVING ENVIRONMENT: Lives with: lives with their family Lives in: House/apartment Stairs: Yes: Internal: 12 steps; can reach both and External: 3 steps; can reach both Has following equipment at home: Grab bars and None  PLOF: Needs assistance with ADLs and Needs assistance with homemaking supervision for outdoor ambulation  PATIENT GOALS  Have a maintenance program  OBJECTIVE:    TODAY'S TREATMENT: 04/16/22 Activity Comments  Berg Balance Test  40/56  5xSTS test 13.78 sec  Dynamic standing balance  -Soccer trap and pass with partner x 5 min -stepping over obstacles 10x forward/backward, varying height -rapid trunk/hip rotation w/ CGA 2x 2 min                  DIAGNOSTIC FINDINGS:   COGNITION: Overall cognitive status: History of cognitive impairments - at baseline   SENSATION: Not tested  COORDINATION: RLE impaired  EDEMA:  none  MUSCLE TONE: RLE: Hypertonic, no ankle clonus detected   MUSCLE LENGTH: No obvious restrictions  DTRs:    POSTURE: weight shift left  LOWER EXTREMITY ROM:     Passive  Right Eval Left Eval  Hip flexion    Hip extension    Hip abduction    Hip adduction    Hip internal rotation    Hip external rotation    Knee flexion    Knee extension    Ankle dorsiflexion -9 WNL  Ankle plantarflexion    Ankle inversion    Ankle eversion     (Blank rows = not tested)  ---father reports that they have a static, progressive (JAS) brace that they use x 30 min daily on right ankle for dorsiflexion ROM  LOWER  EXTREMITY MMT:    MMT Right Eval Left Eval  Hip flexion 5 4  Hip extension    Hip abduction 5 4  Hip adduction 5 4  Hip internal rotation    Hip external rotation    Knee flexion 5 4  Knee extension 5 4  Ankle dorsiflexion 5 4  Ankle plantarflexion    Ankle inversion 5 4  Ankle eversion 5 3  (Blank rows = not tested)  BED MOBILITY:  Independent  TRANSFERS: Assistive device utilized: None  Sit to stand: Complete Independence and Modified independence Stand to sit: Complete Independence Chair to chair: Complete Independence and Modified independence Floor:  DNT   CURB:  Level of Assistance: SBA and CGA Assistive device utilized: None Curb Comments: CGA  STAIRS:  Level of Assistance: SBA  Stair Negotiation Technique: Step to Pattern with Bilateral Rails  Number of Stairs: 6   Height of Stairs: 4-6"  Comments: right knee wobble w/ descending  GAIT: Gait pattern: trunk rotated posterior- Right, knee extensor thrust Distance  walked:  Assistive device utilized: None, right ankle hinged [place] Level of assistance: Modified independence and SBA Comments: notes right ankle inversion when walking barefoot  FUNCTIONAL TESTs:  5 times sit to stand: 19.10 sec--bias to LLE Timed up and go (TUG): DNT Berg Balance Scale: 32/56 10 meter walk test: 12.39 sec = 2.6 ft/sec   PATIENT EDUCATION: Education details: regarding assessment findings and rationale of PT intervention Person educated: Patient and Parent Education method: Explanation Education comprehension: verbalized understanding   HOME EXERCISE PROGRAM: Access Code: [email or web address] URL: [email or web address] Date: 03/26/2022 Prepared by: Sherlyn Lees  Exercises - Seated Ankle Eversion with Resistance  - 1 x daily - 7 x weekly - 3 sets - 10 reps -Recumbent stationary bicycling    GOALS: Goals reviewed with patient? Yes  SHORT TERM GOALS: Target date: 05/07/2022  Patient will perform HEP with  family/caregiver supervision for improved strength, balance, transfers, and gait  Baseline: in development to incorporate land and aquatic-based interventions Goal status: On-going,    LONG TERM GOALS: Target date: 06/18/2022  Reduce risk for falls per score 45/56 Berg Balance Test Baseline: 32/56; (04/16/22) 40/56 Goal status: Goal Met, UPGRADE  2.  Increase right eversion strength to 4/5 to improve ankle posture and decrease inversion when walking barefoot to reduce risk for fall/injury Baseline: 3/5; (04/16/22) 3+/5  Goal status: on-going  3.  Improve BLE strength and balance per time of 12 sec 5xSTS to reduce risk for falls Baseline: 19+ sec with LLE bias/weight shift; (04/16/22) 13.78 sec Goal status: Goal met, UPGRADE    ASSESSMENT:  CLINICAL IMPRESSION: Demonstrating improved functional performance as evidenced by significant improvements in his Berg Balance Test score improved from 32 to 40/56 and time on Five Times Sit to Stand Test from 19+ to 13.78 sec indicating improve dynamic balance and general strength; thus, necessitating an upgrade to STG/LTG.  Patient is scheduled to begin aquatic-based intervention to complement land-based interventions to provide maximum engagement and support to improve function despite his hypertonicity.  Patient and his father have acquired a membership to local fitness club in preparation for long-term transition to a fitness facility/HEP routine that will also incorporate his up-coming aquatic-based interventions as well.  Patient also recently received Botox injections to his right gastroc/plantarflexors last week which should also facilitate improved timing of PT interventions to improve LE ROM, motor control, and dynamic balance for maximum therapeutic effect.   OBJECTIVE IMPAIRMENTS Abnormal gait, decreased balance, decreased coordination, difficulty walking, decreased strength, improper body mechanics, and postural dysfunction.   ACTIVITY  LIMITATIONS carrying, lifting, bending, squatting, stairs, and locomotion level  PARTICIPATION LIMITATIONS: cleaning, interpersonal relationship, community activity, and yard work  PERSONAL FACTORS Time since onset of injury/illness/exacerbation and 1 comorbidity: hx of TBI  are also affecting patient's functional outcome.   REHAB POTENTIAL: Good  CLINICAL DECISION MAKING: Evolving/moderate complexity  EVALUATION COMPLEXITY: Moderate  PLAN: PT FREQUENCY: 1x/week  PT DURATION: 12 weeks  PLANNED INTERVENTIONS: Therapeutic exercises, Therapeutic activity, Neuromuscular re-education, Balance training, Gait training, Patient/Family education, Self Care, Joint mobilization, Stair training, Vestibular training, Canalith repositioning, Orthotic/Fit training, DME instructions, Aquatic Therapy, Electrical stimulation, Wheelchair mobility training, Spinal mobilization, Cryotherapy, Moist heat, Taping, Ionotophoresis 55m/ml Dexamethasone, and Manual therapy  PLAN FOR NEXT SESSION: develop HEP, sit-stand stride stance, standing on compliant surfaces   8:06 AM, 04/16/22 M. KSherlyn Lees PT, DPT Physical Therapist- CUlenOffice Number: [phone]

## 2022-04-16 NOTE — Therapy (Signed)
OUTPATIENT OCCUPATIONAL THERAPY  Treatment Note  Patient Name: Paul Kane MRN: [id number] DOB:July 24, 1985, 36 y.o., male Today's Date: 04/16/2022  PCP: Bailey Mech, PA- C REFERRING PROVIDER: Ranelle Oyster, MD   OT End of Session - 04/16/22 [phone]     Visit Number 3    Number of Visits 9    Date for OT Re-Evaluation 06/19/22    Authorization Type BCBS primary/ [place] Medicaid secondary    Authorization Time Period Auth#: [id number] , approved for 8 OT visits from 04/02/2022-06/24/2022    Authorization - Visit Number 2    Authorization - Number of Visits 8    OT Start Time 0849    OT Stop Time 0929    OT Time Calculation (min) 40 min               Past Medical History:  Diagnosis Date   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[facility])    Chronic hepatitis B ([facility])    Fixed pupils    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[facility])    Hypertension    Intracranial injury of other and unspecified nature, without mention of open intracranial wound, unspecified state of consciousness    Intracranial shunt    Paralysis ([facility])    Spastic hemiplegia affecting dominant side ([facility])    Stroke ([facility])    Traumatic brain injury ([facility])    Trouble swallowing    Visual disturbance    Weakness    Past Surgical History:  Procedure Laterality Date   cramiectomy     CRANIOPLASTY     REMOVAL OF GASTROSTOMY TUBE     VENTRICULOPERITONEAL SHUNT     Patient Active Problem List   Diagnosis Date Noted   SIRS due to infectious process with acute organ dysfunction ([facility]) 03/14/2021   SIRS (systemic inflammatory response syndrome) ([facility]) 03/12/2021   Syncope 03/12/2021   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) 03/12/2021   Hypokalemia 03/12/2021   Lactic acidosis 03/12/2021   Mixed hyperlipidemia 04/25/2018   Seizure disorder ([facility]) 04/11/2018   Mechanical low back pain 05/12/2017   Dysarthria 12/20/2015   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/20/2012   Deafness, sensorineural 04/20/2012    Buzzing in ear 04/20/2012   Leaking percutaneous endoscopic gastrostomy (PEG) tube ([facility]) 02/04/2012   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 pre-existing conscious levels, sequela ([facility]) 10/12/2011   Cerebral infarct ([facility]) 10/12/2011   Spastic hemiplegia of right dominant side due to noncerebrovascular etiology ([facility]) 10/12/2011   Dysphagia 10/12/2011   Functioning G-tube 03/24/2011   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   Hemiplegia of dominant side ([facility]) 07/28/2010   CEREBRAL HEMORRHAGE 07/28/2010   PERSONAL HISTORY OF TRAUMATIC BRAIN INJURY 07/02/2010   ALLERGIC RHINITIS CAUSE UNSPECIFIED 10/25/2008   GASTROENTERITIS 08/09/2008   CONTACT DERMATITIS&OTHER ECZEMA DUE DETERGENTS 08/18/2007   HEPATITIS B, CHRONIC 06/28/2007   Chronic viral hepatitis B without delta-agent ([facility]) 06/28/2007    ONSET DATE: 03/04/22 (referral date)  REFERRING DIAG: G81.11 (ICD-10-CM) - Spastic hemiplegia of right dominant side due to noncerebrovascular etiology   THERAPY DIAG:  Spastic hemiplegia affecting right dominant side, unspecified etiology ([facility])  Stiffness of right shoulder, not elsewhere classified  Stiffness of right elbow, not elsewhere classified  Stiffness of right hand, not elsewhere classified  Muscle weakness (generalized)  Rationale for Evaluation and Treatment Rehabilitation  SUBJECTIVE:   SUBJECTIVE STATEMENT: Pt reports that he likes to use the bike at the gym.  Pt accompanied by: self and family  member (dad)  PERTINENT HISTORY: 1. History of traumatic brain injury with hydrocephalus, meningitis,   and right pontine stroke (2011) 2. Spastic tetraplegia, dominant side more affected 3. severe oropharyngeal dysphagia--improved--tolerating diet well.  Continues on thickened liquids 4. Absence seizures 5. Mechanical Low back pain: due to posture/altered gait patterns--improved 6. Left great toe infection--podiatry f/u     PRECAUTIONS: Fall and Other: h/o seizures  WEIGHT BEARING RESTRICTIONS No  PAIN:  Are you having pain? No and does report pain in R elbow when he touches it, but does not hurt at rest  FALLS: Has patient fallen in last 6 months? Yes. Number of falls 4-5  LIVING ENVIRONMENT: Lives with: lives with their family Lives in: House/apartment Stairs: Yes: Internal: full flight to 2nd floor, but bedroom and bathroom on main floor and does not need to go up steps; can reach both and External: 3 steps; can reach both Has following equipment at home: shower chair and Grab bars  PLOF: Needs assistance with ADLs  PATIENT GOALS "arm" stronger and flexible  OBJECTIVE:   HAND DOMINANCE: Right  ADLs: Mobility: Mod I around the house, hand held assist when in the community Grooming: Mod I  UB Dressing: Mod I LB Dressing: family assists with donning AFO and tying shoes Toileting: Mod I - utilizes bidet for hygiene Bathing: family assists with bathing, he completes ~90% Tub Shower transfers: Supervision/assist with shower transfers Equipment: Shower seat with back, Grab bars, and Walk in shower  UPPER EXTREMITY ROM     Active ROM Right eval Left eval  Shoulder flexion 106   Shoulder abduction    Shoulder adduction    Shoulder extension    Shoulder internal rotation    Shoulder external rotation    Elbow flexion 123   Elbow extension -34   Wrist flexion 12   Wrist extension -12   Wrist ulnar deviation    Wrist radial deviation    Wrist pronation    Wrist supination    (Blank rows = not tested)  Passive ROM Right eval Left eval  Shoulder flexion 110   Shoulder abduction    Shoulder adduction    Shoulder extension    Shoulder internal rotation    Shoulder external rotation    Elbow flexion 143   Elbow extension -32   Wrist flexion 40   Wrist extension 34   Wrist ulnar deviation    Wrist radial deviation    Wrist pronation    Wrist supination    (Blank rows = not  tested)  HAND FUNCTION: Pt is able to manually open hand with great effort  MUSCLE TONE: RUE: Moderate, Hypertonic, and Modifed Ashworth Scale 3 = Considerable increase in muscle tone, passive movement difficult  COGNITION: Overall cognitive status: Impaired, History of cognitive impairments - at baseline, and father present providing majority of information --------------------------------------------------------------------------------------------------------------------------------------------------------  TODAY'S TREATMENT:  Arm bike: 6 min total, alternating forward/backward every 2 min.  OT providing hand over hand assist at R hand to maintain grasp on arm bike.  Completed 6 mins on resistance level 1.0 complete 0.3 miles total. Supine shoulder: OT providing facilitation at shoulder and elbow for increased ROM.  Therapist providing intermittent verbal cues to attend to RUE to focus on increased elbow extension as able.  Utilized dowel for grasp and symmetry with chest press in supine x15. Wrist supination/pronation: Pt limited in use of [place] glide due to decreased supination.  Therefore focused on PROM and stretch within tolerance.  OT educated  father on hand placement and technique to facilitate stretch at home.  Discussed slow stretch to minimize effects of spasticity on stretch.    04/09/22 Reviewed initial HEP from evaluation: OT providing hand over hand initially to facilitate increased ROM and elbow extension. Engaging in elbow extension and shoulder flexion with use of SPC with hand placement on handle for improved positioning. Saebo glide: Closed-chain AAROM in 45* angle to facilitate shoulder flexion/extension.  OT providing hand over hand to maintain grasp on saebo glide due to tone in wrist and hand. Pt demonstrating decreased supination to obtain and maintain grasp during shoulder flexion/extension activity. OT providing facilitation and mod cues for technique to further  facilitate shoulder flexion without compensatory trunk movements. Shoulder/elbow ROM: OT instructed in new exercises with focus on further facilitation of shoulder ROM and elbow ROM.  Engaged in chest press with SPC with focus on improved symmetry with elbow extension and shoulder flexion.  Therapist providing cues and weighting to [facility] on L to facilitate improved symmetry and assist with motor control.  Engaged in horizontal abduction/adduction and external rotation with dowel.  Pt requiring mod assist from therapist for improved technique, frequently attempting to incorporate compensatory strategies with weight shifting and leaning.  Therapist providing intermittent tactile and verbal cues for attempts at full range and increased sustained stretch when possible.    03/26/22 PROM to R shoulder and elbow with focus on increased ROM.   Therapist instructed in shoulder ROM and elbow extension with SPC as dowel with focus on elbow flexion/extension and shoulder circles to facilitate increased shoulder flexion/extension.    PATIENT EDUCATION: Education details: Estate manager/land agent and hand over hand assist from family member to assist with proper technique for HEP Person educated: Patient and Parent Education method: Explanation, Demonstration, Actor cues, and Handouts Education comprehension: needs further education   HOME EXERCISE PROGRAM: Access Code: [email or web address] URL: [email or web address] Date: 04/16/2022 Prepared by: [facility] - [facility]  Exercises - Seated Shoulder Circles AAROM with Dowel into Wall  - 1 x daily - 2 sets - 10 reps - Seated Shoulder Flexion Extension AAROM with Dowel into Wall  - 1 x daily - 2 sets - 10 reps - Seated Chest Press with Bar  - 1 x daily - 2 sets - 10 reps - Seated Shoulder External Rotation AAROM with Cane and Hand in Neutral  - 1 x daily - 2 sets - 10 reps - Seated Forearm Supination PROM with Caregiver  - 1 x daily - 2  sets - 10 reps    GOALS: Goals reviewed with patient? Yes  SHORT TERM GOALS: Target date: 04/24/22  Pt and caregiver will be independent with HEP for shoulder and elbow ROM. Baseline: Pt not currently engaging in any ROM/stretching program Goal status: IN PROGRESS  2.  Pt will demonstrate improved active elbow extension to -30* to increase functional use Baseline: -34* Goal status: IN PROGRESS  3.  Pt will demonstrate improved shoulder flexion to 110* to increase functional use Baseline: 106* Goal status: IN PROGRESS   LONG TERM GOALS: Target date: 06/19/22  Pt will tolerate advanced HEP/stretching program for RUE. Baseline: No HEP at this time Goal status: IN PROGRESS  2.  Pt will demonstrate improved active elbow extension to -20* to increase functional use Baseline: -34* Goal status: IN PROGRESS  3.  Pt will demonstrate improved shoulder flexion to 115* to allow for improved ease with ADLs. Baseline: 106* Goal status: IN PROGRESS  4.  Pt will be independent with splint wear and care PRN Baseline: splinting needs TBD Goal status: IN PROGRESS  ASSESSMENT:  CLINICAL IMPRESSION: Treatment session with focus on progression of HEP. Pt benefiting from hand over hand to maintain grasp and proper placement of RUE on arm bike handle and dowel due to tone in wrist and fingers limiting sustained grasp.  Pt's father reports understanding of PROM supination/pronation stretch. Pt tolerating ROM and stretch with no reports of pain.  PERFORMANCE DEFICITS in functional skills including ADLs, IADLs, coordination, dexterity, tone, ROM, strength, pain, flexibility, FMC, GMC, decreased knowledge of precautions, and UE functional use, cognitive skills including learn, memory, and safety awareness.  IMPAIRMENTS are limiting patient from ADLs and IADLs.   COMORBIDITIES may have co-morbidities  that affects occupational performance. Patient will benefit from skilled OT to address above  impairments and improve overall function.  MODIFICATION OR ASSISTANCE TO COMPLETE EVALUATION: Min-Moderate modification of tasks or assist with assess necessary to complete an evaluation.  OT OCCUPATIONAL PROFILE AND HISTORY: Detailed assessment: Review of records and additional review of physical, cognitive, psychosocial history related to current functional performance.  CLINICAL DECISION MAKING: Moderate - several treatment options, min-mod task modification necessary  REHAB POTENTIAL: Fair ongoing spasticity  EVALUATION COMPLEXITY: Moderate    PLAN: OT FREQUENCY: 1x/week  OT DURATION: 12 weeks (Plan for 8 visits over 12 weeks, but asking 12 weeks to allow for time to benefit upcoming botox injections)  PLANNED INTERVENTIONS: self care/ADL training, therapeutic exercise, therapeutic activity, neuromuscular re-education, manual therapy, passive range of motion, aquatic therapy, splinting, moist heat, cryotherapy, and patient/family education  RECOMMENDED OTHER SERVICES: may benefit from aquatic therapy OT  CONSULTED AND AGREED WITH PLAN OF CARE: Patient and family member/caregiver  PLAN FOR NEXT SESSION: review HEP and begin PROM and self-ROM for shoulder, elbow, wrist ROM.   Simonne Come, OTR/L 04/16/2022, 9:04 AM

## 2022-04-21 ENCOUNTER — Ambulatory Visit: Payer: BC Managed Care – PPO | Attending: Physical Medicine & Rehabilitation | Admitting: Occupational Therapy

## 2022-04-21 DIAGNOSIS — M25621 Stiffness of right elbow, not elsewhere classified: Secondary | ICD-10-CM | POA: Diagnosis present

## 2022-04-21 DIAGNOSIS — R2681 Unsteadiness on feet: Secondary | ICD-10-CM | POA: Diagnosis present

## 2022-04-21 DIAGNOSIS — M6281 Muscle weakness (generalized): Secondary | ICD-10-CM | POA: Insufficient documentation

## 2022-04-21 DIAGNOSIS — M25611 Stiffness of right shoulder, not elsewhere classified: Secondary | ICD-10-CM | POA: Insufficient documentation

## 2022-04-21 DIAGNOSIS — R262 Difficulty in walking, not elsewhere classified: Secondary | ICD-10-CM | POA: Diagnosis present

## 2022-04-21 DIAGNOSIS — R2689 Other abnormalities of gait and mobility: Secondary | ICD-10-CM | POA: Diagnosis present

## 2022-04-21 DIAGNOSIS — G8111 Spastic hemiplegia affecting right dominant side: Secondary | ICD-10-CM | POA: Insufficient documentation

## 2022-04-21 DIAGNOSIS — M25641 Stiffness of right hand, not elsewhere classified: Secondary | ICD-10-CM | POA: Insufficient documentation

## 2022-04-21 NOTE — Therapy (Signed)
OUTPATIENT OCCUPATIONAL THERAPY  Treatment Note  Patient Name: Paul Kane MRN: [id number] DOB:18-Jul-1986, 36 y.o., male Today's Date: 04/21/2022  PCP: Jonathon Bellows, [place] PROVIDER: Meredith Staggers, MD   OT End of Session - 04/21/22 1256     Visit Number 4    Number of Visits 9    Date for OT Re-Evaluation 06/19/22    Authorization Type [employer]BS primary/ [place] Medicaid secondary    Authorization Time Period Auth#: [id number] , approved for 8 OT visits from 04/02/2022-06/24/2022    Authorization - Visit Number 3    Authorization - Number of Visits 8    OT Start Time 1243   arrival time   OT Stop Time 1317    OT Time Calculation (min) 34 min                Past Medical History:  Diagnosis Date   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[place])    Chronic hepatitis B ([place])    Fixed pupils    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[place])    Hypertension    Intracranial injury of other and unspecified nature, without mention of open intracranial wound, unspecified state of consciousness    Intracranial shunt    Paralysis ([facility])    Spastic hemiplegia affecting dominant side ([place])    Stroke ([place])    Traumatic brain injury ([place])    Trouble swallowing    Visual disturbance    Weakness    Past Surgical History:  Procedure Laterality Date   cramiectomy     CRANIOPLASTY     REMOVAL OF GASTROSTOMY TUBE     VENTRICULOPERITONEAL SHUNT     Patient Active Problem List   Diagnosis Date Noted   SIRS due to infectious process with acute organ dysfunction ([place]) 03/14/2021   SIRS (systemic inflammatory response syndrome) ([place]) 03/12/2021   Syncope 03/12/2021   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[place]) 03/12/2021   Hypokalemia 03/12/2021   Lactic acidosis 03/12/2021   Mixed hyperlipidemia 04/25/2018   Seizure disorder ([place]) 04/11/2018   Mechanical low back pain 05/12/2017   Dysarthria 12/20/2015   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/20/2012   Deafness,  sensorineural 04/20/2012   Buzzing in ear 04/20/2012   Leaking percutaneous endoscopic gastrostomy (PEG) tube ([place]) 02/04/2012   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 pre-existing conscious levels, sequela ([place]) 10/12/2011   Cerebral infarct ([place]) 10/12/2011   Spastic hemiplegia of right dominant side due to noncerebrovascular etiology ([place]) 10/12/2011   Dysphagia 10/12/2011   Functioning G-tube 03/24/2011   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   Hemiplegia of dominant side ([place]) 07/28/2010   CEREBRAL HEMORRHAGE 07/28/2010   PERSONAL HISTORY OF TRAUMATIC BRAIN INJURY 07/02/2010   ALLERGIC RHINITIS CAUSE UNSPECIFIED 10/25/2008   GASTROENTERITIS 08/09/2008   CONTACT DERMATITIS&OTHER ECZEMA DUE DETERGENTS 08/18/2007   HEPATITIS B, CHRONIC 06/28/2007   Chronic viral hepatitis B without delta-agent ([place]) 06/28/2007    ONSET DATE: 03/04/22 (referral date)  REFERRING DIAG: G81.11 (ICD-10-CM) - Spastic hemiplegia of right dominant side due to noncerebrovascular etiology   THERAPY DIAG:  Spastic hemiplegia affecting right dominant side, unspecified etiology ([place])  Stiffness of right shoulder, not elsewhere classified  Stiffness of right elbow, not elsewhere classified  Stiffness of right hand, not elsewhere classified  Muscle weakness (generalized)  Rationale for Evaluation and Treatment Rehabilitation  SUBJECTIVE:   SUBJECTIVE STATEMENT: Pt reports that he did his homework (showing therapist his supination exercise).  Pt accompanied  by: self and family member (dad)  PERTINENT HISTORY: 1. History of traumatic brain injury with hydrocephalus, meningitis,   and right pontine stroke (2011) 2. Spastic tetraplegia, dominant side more affected 3. severe oropharyngeal dysphagia--improved--tolerating diet well.  Continues on thickened liquids 4. Absence seizures 5. Mechanical Low back pain: due to posture/altered gait  patterns--improved 6. Left great toe infection--podiatry f/u    PRECAUTIONS: Fall and Other: h/o seizures  WEIGHT BEARING RESTRICTIONS No  PAIN:  Are you having pain? No and does report pain in R elbow when he touches it, but does not hurt at rest  FALLS: Has patient fallen in last 6 months? Yes. Number of falls 4-5  LIVING ENVIRONMENT: Lives with: lives with their family Lives in: House/apartment Stairs: Yes: Internal: full flight to 2nd floor, but bedroom and bathroom on main floor and does not need to go up steps; can reach both and External: 3 steps; can reach both Has following equipment at home: shower chair and Grab bars  PLOF: Needs assistance with ADLs  PATIENT GOALS "arm" stronger and flexible  OBJECTIVE:   HAND DOMINANCE: Right  ADLs: Mobility: Mod I around the house, hand held assist when in the community Grooming: Mod I  UB Dressing: Mod I LB Dressing: family assists with donning AFO and tying shoes Toileting: Mod I - utilizes bidet for hygiene Bathing: family assists with bathing, he completes ~90% Tub Shower transfers: Supervision/assist with shower transfers Equipment: Shower seat with back, Grab bars, and Walk in shower  UPPER EXTREMITY ROM     Active ROM Right eval Left eval Right 04/21/22  Shoulder flexion 106  104  Shoulder abduction     Shoulder adduction     Shoulder extension     Shoulder internal rotation     Shoulder external rotation     Elbow flexion 123  132  Elbow extension -34  -38  Wrist flexion 12    Wrist extension -12    Wrist ulnar deviation     Wrist radial deviation     Wrist pronation     Wrist supination     (Blank rows = not tested)  Passive ROM Right eval Left eval Right 04/21/22  Shoulder flexion 110  115  Shoulder abduction     Shoulder adduction     Shoulder extension     Shoulder internal rotation     Shoulder external rotation     Elbow flexion 143  145  Elbow extension -32  -34  Wrist flexion 40     Wrist extension 34    Wrist ulnar deviation     Wrist radial deviation     Wrist pronation     Wrist supination     (Blank rows = not tested)  HAND FUNCTION: Pt is able to manually open hand with great effort  MUSCLE TONE: RUE: Moderate, Hypertonic, and Modifed Ashworth Scale 3 = Considerable increase in muscle tone, passive movement difficult  COGNITION: Overall cognitive status: Impaired, History of cognitive impairments - at baseline, and father present providing majority of information --------------------------------------------------------------------------------------------------------------------------------------------------------  TODAY'S TREATMENT: 04/21/22 NMR/stretch: WB through [place] on half ball to accommodate for decreased hand opening and wrist extension. OT facilitating increased shoulder and elbow ROM with reaching outside BOS with LUE while maintaining WB through RUE.  OT with hand placement over R hand to obtain and maintain positioning and providing tactile cues for increased stretch. Saebo glide: OT facilitating supination and maintaining hand placement on Saebo glide while facilitating increased shoulder  flexion and elbow extension at 45* angle. OT providing multimodal cues for posture due to tendency to compensate with trunk rotation and forward lean. PROM: supination/pronation and wrist flexion/extension with manual facilitation and hold for sustained stretch within pain tolerance.   04/16/22 Arm bike: 6 min total, alternating forward/backward every 2 min.  OT providing hand over hand assist at R hand to maintain grasp on arm bike.  Completed 6 mins on resistance level 1.0 complete 0.3 miles total. Supine shoulder: OT providing facilitation at shoulder and elbow for increased ROM.  Therapist providing intermittent verbal cues to attend to RUE to focus on increased elbow extension as able.  Utilized dowel for grasp and symmetry with chest press in supine x15. Wrist  supination/pronation: Pt limited in use of Saebo glide due to decreased supination.  Therefore focused on PROM and stretch within tolerance.  OT educated father on hand placement and technique to facilitate stretch at home.  Discussed slow stretch to minimize effects of spasticity on stretch.    04/09/22 Reviewed initial HEP from evaluation: OT providing hand over hand initially to facilitate increased ROM and elbow extension. Engaging in elbow extension and shoulder flexion with use of SPC with hand placement on handle for improved positioning. Saebo glide: Closed-chain AAROM in 45* angle to facilitate shoulder flexion/extension.  OT providing hand over hand to maintain grasp on saebo glide due to tone in wrist and hand. Pt demonstrating decreased supination to obtain and maintain grasp during shoulder flexion/extension activity. OT providing facilitation and mod cues for technique to further facilitate shoulder flexion without compensatory trunk movements. Shoulder/elbow ROM: OT instructed in new exercises with focus on further facilitation of shoulder ROM and elbow ROM.  Engaged in chest press with SPC with focus on improved symmetry with elbow extension and shoulder flexion.  Therapist providing cues and weighting to [employer] on L to facilitate improved symmetry and assist with motor control.  Engaged in horizontal abduction/adduction and external rotation with dowel.  Pt requiring mod assist from therapist for improved technique, frequently attempting to incorporate compensatory strategies with weight shifting and leaning.  Therapist providing intermittent tactile and verbal cues for attempts at full range and increased sustained stretch when possible.    PATIENT EDUCATION: Education details: Reiterated HEP for shoulder, elbow, and forearm stretches Person educated: Patient and Parent Education method: Explanation, Demonstration, Corporate treasurer cues, and Handouts Education comprehension: needs further  education   HOME EXERCISE PROGRAM: Access Code: [email or web address] URL: [email or web address] Date: 04/16/2022 Prepared by: [facility]  Exercises - Seated Shoulder Circles AAROM with Dowel into Wall  - 1 x daily - 2 sets - 10 reps - Seated Shoulder Flexion Extension AAROM with Dowel into Wall  - 1 x daily - 2 sets - 10 reps - Seated Chest Press with Bar  - 1 x daily - 2 sets - 10 reps - Seated Shoulder External Rotation AAROM with Cane and Hand in Neutral  - 1 x daily - 2 sets - 10 reps - Seated Forearm Supination PROM with Caregiver  - 1 x daily - 2 sets - 10 reps    GOALS: Goals reviewed with patient? Yes  SHORT TERM GOALS: Target date: 04/24/22  Pt and caregiver will be independent with HEP for shoulder and elbow ROM. Baseline: Pt not currently engaging in any ROM/stretching program Goal status: MET - 04/21/22  2.  Pt will demonstrate improved active elbow extension to -30* to increase functional use Baseline: -34* Goal status:  NOT MET - 04/21/22  3.  Pt will demonstrate improved shoulder flexion to 110* to increase functional use Baseline: 106* Goal status: NOT MET - 04/21/22   LONG TERM GOALS: Target date: 06/19/22  Pt will tolerate advanced HEP/stretching program for RUE. Baseline: No HEP at this time Goal status: IN PROGRESS  2.  Pt will demonstrate improved active elbow extension to -20* to increase functional use Baseline: -34* Goal status: IN PROGRESS  3.  Pt will demonstrate improved shoulder flexion to 115* to allow for improved ease with ADLs. Baseline: 106* Goal status: IN PROGRESS  4.  Pt will be independent with splint wear and care PRN Baseline: splinting needs TBD Goal status: IN PROGRESS  ASSESSMENT:  CLINICAL IMPRESSION: Treatment session with focus on progression of HEP. Pt benefiting from hand over hand to maintain grasp and proper placement of RUE on arm bike handle and dowel due to tone in wrist and  fingers limiting sustained grasp.  Pt's father reports understanding of PROM supination/pronation stretch. Pt tolerating ROM and stretch with no reports of pain.  PERFORMANCE DEFICITS in functional skills including ADLs, IADLs, coordination, dexterity, tone, ROM, strength, pain, flexibility, FMC, GMC, decreased knowledge of precautions, and UE functional use, cognitive skills including learn, memory, and safety awareness.  IMPAIRMENTS are limiting patient from ADLs and IADLs.   COMORBIDITIES may have co-morbidities  that affects occupational performance. Patient will benefit from skilled OT to address above impairments and improve overall function.  MODIFICATION OR ASSISTANCE TO COMPLETE EVALUATION: Min-Moderate modification of tasks or assist with assess necessary to complete an evaluation.  OT OCCUPATIONAL PROFILE AND HISTORY: Detailed assessment: Review of records and additional review of physical, cognitive, psychosocial history related to current functional performance.  CLINICAL DECISION MAKING: Moderate - several treatment options, min-mod task modification necessary  REHAB POTENTIAL: Fair ongoing spasticity  EVALUATION COMPLEXITY: Moderate    PLAN: OT FREQUENCY: 1x/week  OT DURATION: 12 weeks (Plan for 8 visits over 12 weeks, but asking 12 weeks to allow for time to benefit upcoming botox injections)  PLANNED INTERVENTIONS: self care/ADL training, therapeutic exercise, therapeutic activity, neuromuscular re-education, manual therapy, passive range of motion, aquatic therapy, splinting, moist heat, cryotherapy, and patient/family education  RECOMMENDED OTHER SERVICES: may benefit from aquatic therapy OT  CONSULTED AND AGREED WITH PLAN OF CARE: Patient and family member/caregiver  PLAN FOR NEXT SESSION: review HEP and begin PROM and self-ROM for shoulder, elbow, wrist ROM.   Simonne Come, OTR/L 04/21/2022, 12:57 PM

## 2022-04-23 ENCOUNTER — Encounter: Payer: Self-pay | Admitting: Podiatry

## 2022-04-23 ENCOUNTER — Encounter (HOSPITAL_BASED_OUTPATIENT_CLINIC_OR_DEPARTMENT_OTHER): Payer: Self-pay | Admitting: Physical Therapy

## 2022-04-23 ENCOUNTER — Ambulatory Visit (HOSPITAL_BASED_OUTPATIENT_CLINIC_OR_DEPARTMENT_OTHER): Payer: BC Managed Care – PPO | Attending: Physical Medicine & Rehabilitation | Admitting: Physical Therapy

## 2022-04-23 ENCOUNTER — Ambulatory Visit (INDEPENDENT_AMBULATORY_CARE_PROVIDER_SITE_OTHER): Payer: BC Managed Care – PPO

## 2022-04-23 ENCOUNTER — Ambulatory Visit (INDEPENDENT_AMBULATORY_CARE_PROVIDER_SITE_OTHER): Payer: BC Managed Care – PPO | Admitting: Podiatry

## 2022-04-23 DIAGNOSIS — M779 Enthesopathy, unspecified: Secondary | ICD-10-CM

## 2022-04-23 DIAGNOSIS — M25621 Stiffness of right elbow, not elsewhere classified: Secondary | ICD-10-CM | POA: Diagnosis not present

## 2022-04-23 DIAGNOSIS — M25611 Stiffness of right shoulder, not elsewhere classified: Secondary | ICD-10-CM | POA: Diagnosis not present

## 2022-04-23 DIAGNOSIS — R2681 Unsteadiness on feet: Secondary | ICD-10-CM

## 2022-04-23 DIAGNOSIS — M6281 Muscle weakness (generalized): Secondary | ICD-10-CM | POA: Diagnosis not present

## 2022-04-23 DIAGNOSIS — G8111 Spastic hemiplegia affecting right dominant side: Secondary | ICD-10-CM | POA: Insufficient documentation

## 2022-04-23 DIAGNOSIS — M25641 Stiffness of right hand, not elsewhere classified: Secondary | ICD-10-CM | POA: Insufficient documentation

## 2022-04-23 DIAGNOSIS — M7751 Other enthesopathy of right foot: Secondary | ICD-10-CM

## 2022-04-23 DIAGNOSIS — R262 Difficulty in walking, not elsewhere classified: Secondary | ICD-10-CM | POA: Insufficient documentation

## 2022-04-23 NOTE — Therapy (Signed)
OUTPATIENT PHYSICAL THERAPY NEURO EVALUATION   Patient Name: Paul Kane MRN: [id number] DOB:01/04/1986, 36 y.o., male Today's Date: 04/23/2022   PCP: Podraza, Cole Chris REFERRING PROVIDER: Swartz, Zachary T, MD      Past Medical History:  Diagnosis Date   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HCC)    Chronic hepatitis B (HCC)    Fixed pupils    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HCC)    Hypertension    Intracranial injury of other and unspecified nature, without mention of open intracranial wound, unspecified state of consciousness    Intracranial shunt    Paralysis (HCC)    Spastic hemiplegia affecting dominant side (HCC)    Stroke (HCC)    Traumatic brain injury (HCC)    Trouble swallowing    Visual disturbance    Weakness    Past Surgical History:  Procedure Laterality Date   cramiectomy     CRANIOPLASTY     REMOVAL OF GASTROSTOMY TUBE     VENTRICULOPERITONEAL SHUNT     Patient Active Problem List   Diagnosis Date Noted   SIRS due to infectious process with acute organ dysfunction (HCC) 03/14/2021   SIRS (systemic inflammatory response syndrome) (HCC) 03/12/2021   Syncope 03/12/2021   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) 03/12/2021   Hypokalemia 03/12/2021   Lactic acidosis 03/12/2021   Mixed hyperlipidemia 04/25/2018   Seizure disorder (HCC) 04/11/2018   Mechanical low back pain 05/12/2017   Dysarthria 12/20/2015   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/20/2012   Deafness, sensorineural 04/20/2012   Buzzing in ear 04/20/2012   Leaking percutaneous endoscopic gastrostomy (PEG) tube (HCC) 02/04/2012   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 pre-existing conscious levels, sequela (HCC) 10/12/2011   Cerebral infarct (HCC) 10/12/2011   Spastic hemiplegia of right dominant side due to noncerebrovascular etiology (HCC) 10/12/2011   Dysphagia 10/12/2011   Functioning G-tube 03/24/2011   OBSTRUCTIVE  HYDROCEPHALUS 07/28/2010   Hemiplegia of dominant side (HCC) 07/28/2010   CEREBRAL HEMORRHAGE 07/28/2010   PERSONAL HISTORY OF TRAUMATIC BRAIN INJURY 07/02/2010   ALLERGIC RHINITIS CAUSE UNSPECIFIED 10/25/2008   GASTROENTERITIS 08/09/2008   CONTACT DERMATITIS&OTHER ECZEMA DUE DETERGENTS 08/18/2007   HEPATITIS B, CHRONIC 06/28/2007   Chronic viral hepatitis B without delta-agent (HCC) 06/28/2007    ONSET DATE: 2012  REFERRING DIAG: G81.11 (ICD-10-CM) - Spastic hemiplegia of right dominant side due to noncerebrovascular etiology   THERAPY DIAG:  No diagnosis found.  Rationale for Evaluation and Treatment Rehabilitation  SUBJECTIVE:                                                                                                                                                                                                SUBJECTIVE STATEMENT: Foot is feeling better and able to tolerate wearing brace/shoe  Pt accompanied by: family member father  PERTINENT HISTORY: . History of traumatic brain injury with hydrocephalus, meningitis,   and right pontine stroke.   2. Spastic tetraplegia, dominant side more affected 3. severe oropharyngeal dysphagia--improved--tolerating diet well.  Continues on thickened liquids 4. Absence seizures 5. Mechanical Low back pain: due to posture/altered gait patterns--improved 6. Left great toe infection--podiatry f/u  PAIN:  Are you having pain? Yes.  Pt indicates right buttock area, no pain exhibited during resisted tests and pt denies pain during functional mobility and fall risk tests.  Denies antalgia  PRECAUTIONS: Fall  WEIGHT BEARING RESTRICTIONS No  FALLS: Has patient fallen in last 6 months? Yes. Number of falls 2  LIVING ENVIRONMENT: Lives with: lives with their family Lives in: House/apartment Stairs: Yes: Internal: 12 steps; can reach both and External: 3 steps; can reach both Has following equipment at home: Grab bars and None  PLOF:  Needs assistance with ADLs and Needs assistance with homemaking supervision for outdoor ambulation  PATIENT GOALS  Have a maintenance program  OBJECTIVE:    TODAY'S TREATMENT:   Pt seen for aquatic therapy today.  Treatment took place in water 3.25-4.5 ft in depth at the [facility] pool. Temp of water was 91.  Pt entered/exited the pool via stairs with cga and hand rail using step to pattern.  *Walking forward back and sideways all depths ue supported by yellow hand buoy *Holding to wall: df; pf; add/abd; squats, hip flex; hip ext    *Seated LAQ; march *STS from bench onto step hha then decreased assistance to cga/sba. Pt gaining immediate standing balance 5/10x *Bottom step right gastroc stretch  Pt requires moderate verbal cuing, demonstration and cga throughout for safety  Pt requires the buoyancy and hydrostatic pressure of water for support, and to offload joints by unweighting joint load by at least 50 % in navel deep water and by at least 75-80% in chest to neck deep water.  Viscosity of the water is needed for resistance of strengthening. Water current perturbations provides challenge to standing balance requiring increased core activation.         04/16/22 Activity Comments  Berg Balance Test  40/56  5xSTS test 13.78 sec  Dynamic standing balance  -Soccer trap and pass with partner x 5 min -stepping over obstacles 10x forward/backward, varying height -rapid trunk/hip rotation w/ CGA 2x 2 min                  DIAGNOSTIC FINDINGS:   COGNITION: Overall cognitive status: History of cognitive impairments - at baseline   SENSATION: Not tested  COORDINATION: RLE impaired  EDEMA:  none  MUSCLE TONE: RLE: Hypertonic, no ankle clonus detected   MUSCLE LENGTH: No obvious restrictions  DTRs:    POSTURE: weight shift left  LOWER EXTREMITY ROM:     Passive  Right Eval Left Eval  Hip flexion    Hip extension    Hip abduction    Hip  adduction    Hip internal rotation    Hip external rotation    Knee flexion    Knee extension    Ankle dorsiflexion -9 WNL  Ankle plantarflexion    Ankle inversion    Ankle eversion     (Blank rows = not tested)  ---father reports that they have a static, progressive (JAS) brace that they use x 30 min daily on right ankle for dorsiflexion ROM    LOWER EXTREMITY MMT:    MMT Right Eval Left Eval  Hip flexion 5 4  Hip extension    Hip abduction 5 4  Hip adduction 5 4  Hip internal rotation    Hip external rotation    Knee flexion 5 4  Knee extension 5 4  Ankle dorsiflexion 5 4  Ankle plantarflexion    Ankle inversion 5 4  Ankle eversion 5 3  (Blank rows = not tested)  BED MOBILITY:  Independent  TRANSFERS: Assistive device utilized: None  Sit to stand: Complete Independence and Modified independence Stand to sit: Complete Independence Chair to chair: Complete Independence and Modified independence Floor:  DNT   CURB:  Level of Assistance: SBA and CGA Assistive device utilized: None Curb Comments: CGA  STAIRS:  Level of Assistance: SBA  Stair Negotiation Technique: Step to Pattern with Bilateral Rails  Number of Stairs: 6   Height of Stairs: 4-6"  Comments: right knee wobble w/ descending  GAIT: Gait pattern: trunk rotated posterior- Right, knee extensor thrust Distance walked:  Assistive device utilized: None, right ankle hinged [place] Level of assistance: Modified independence and SBA Comments: notes right ankle inversion when walking barefoot  FUNCTIONAL TESTs:  5 times sit to stand: 19.10 sec--bias to LLE Timed up and go (TUG): DNT Berg Balance Scale: 32/56 10 meter walk test: 12.39 sec = 2.6 ft/sec   PATIENT EDUCATION: Education details: regarding assessment findings and rationale of PT intervention Person educated: Patient and Parent Education method: Explanation Education comprehension: verbalized understanding   HOME EXERCISE  PROGRAM: Access Code: [email or web address] URL: [email or web address] Date: 03/26/2022 Prepared by: Kelly Halpin  Exercises - Seated Ankle Eversion with Resistance  - 1 x daily - 7 x weekly - 3 sets - 10 reps -Recumbent stationary bicycling    GOALS: Goals reviewed with patient? Yes  SHORT TERM GOALS: Target date: 05/07/2022  Patient will perform HEP with family/caregiver supervision for improved strength, balance, transfers, and gait  Baseline: in development to incorporate land and aquatic-based interventions Goal status: On-going,    LONG TERM GOALS: Target date: 06/18/2022  Reduce risk for falls per score 45/56 Berg Balance Test Baseline: 32/56; (04/16/22) 40/56 Goal status: Goal Met, UPGRADE  2.  Increase right eversion strength to 4/5 to improve ankle posture and decrease inversion when walking barefoot to reduce risk for fall/injury Baseline: 3/5; (04/16/22) 3+/5  Goal status: on-going  3.  Improve BLE strength and balance per time of 12 sec 5xSTS to reduce risk for falls Baseline: 19+ sec with LLE bias/weight shift; (04/16/22) 13.78 sec Goal status: Goal met, UPGRADE    ASSESSMENT:  CLINICAL IMPRESSION: Paul Kane familiar with setting.  He is directed through strengthening, stretching and balance activities as per POC.  Right ankle inverting and supinating without AFO with amb on deck as well as submerged almost bearing weight on lateral aspect of foot.  Last episode pt tended to invert without supination. Passive ROm into eversion and pronation full and unrestricted.  May be related to recent botox injections. Father worried he will roll over on ankle and injure/fall. He is encouraged to have him wear his shoes with AFO at all times to prevent. Pt is a good candidate for aquatic rehab and with benefit from the properties of water to progress and enhance progression towards goals. Pt requires assist of therapist in pool for safety      OBJECTIVE IMPAIRMENTS Abnormal  gait, decreased balance, decreased coordination, difficulty walking, decreased strength, improper body mechanics, and postural dysfunction.     ACTIVITY LIMITATIONS carrying, lifting, bending, squatting, stairs, and locomotion level  PARTICIPATION LIMITATIONS: cleaning, interpersonal relationship, community activity, and yard work  PERSONAL FACTORS Time since onset of injury/illness/exacerbation and 1 comorbidity: hx of TBI  are also affecting patient's functional outcome.   REHAB POTENTIAL: Good  CLINICAL DECISION MAKING: Evolving/moderate complexity  EVALUATION COMPLEXITY: Moderate  PLAN: PT FREQUENCY: 1x/week  PT DURATION: 12 weeks  PLANNED INTERVENTIONS: Therapeutic exercises, Therapeutic activity, Neuromuscular re-education, Balance training, Gait training, Patient/Family education, Self Care, Joint mobilization, Stair training, Vestibular training, Canalith repositioning, Orthotic/Fit training, DME instructions, Aquatic Therapy, Electrical stimulation, Wheelchair mobility training, Spinal mobilization, Cryotherapy, Moist heat, Taping, Ionotophoresis 4mg/ml Dexamethasone, and Manual therapy  PLAN FOR NEXT SESSION: develop HEP, sit-stand stride stance, standing on compliant surfaces   1:34 PM, 04/23/22 Mary (Paul Kane) Ziemba MPT Physical Therapist- [place] Office Number: [phone]         

## 2022-04-28 ENCOUNTER — Ambulatory Visit: Payer: BC Managed Care – PPO | Admitting: Occupational Therapy

## 2022-04-28 ENCOUNTER — Ambulatory Visit: Payer: BC Managed Care – PPO

## 2022-04-28 DIAGNOSIS — M25611 Stiffness of right shoulder, not elsewhere classified: Secondary | ICD-10-CM

## 2022-04-28 DIAGNOSIS — R2681 Unsteadiness on feet: Secondary | ICD-10-CM

## 2022-04-28 DIAGNOSIS — R2689 Other abnormalities of gait and mobility: Secondary | ICD-10-CM

## 2022-04-28 DIAGNOSIS — M6281 Muscle weakness (generalized): Secondary | ICD-10-CM

## 2022-04-28 DIAGNOSIS — G8111 Spastic hemiplegia affecting right dominant side: Secondary | ICD-10-CM | POA: Diagnosis not present

## 2022-04-28 DIAGNOSIS — R262 Difficulty in walking, not elsewhere classified: Secondary | ICD-10-CM

## 2022-04-28 DIAGNOSIS — M25621 Stiffness of right elbow, not elsewhere classified: Secondary | ICD-10-CM

## 2022-04-28 DIAGNOSIS — M25641 Stiffness of right hand, not elsewhere classified: Secondary | ICD-10-CM

## 2022-04-28 NOTE — Therapy (Signed)
OUTPATIENT OCCUPATIONAL THERAPY  Treatment Note  Patient Name: Paul Kane MRN: [id number] DOB:08/14/1985, 36 y.o., male Today's Date: 04/28/2022  PCP: Jonathon Bellows, [place] PROVIDER: Meredith Staggers, MD   OT End of Session - 04/28/22 0815     Visit Number 5    Number of Visits 9    Date for OT Re-Evaluation 06/19/22    Authorization Type [employer]BS primary/ [place] Medicaid secondary    Authorization Time Period Auth#: [id number] , approved for 8 OT visits from 04/02/2022-06/24/2022    Authorization - Visit Number 4    Authorization - Number of Visits 8    OT Start Time [phone]    OT Stop Time 0845    OT Time Calculation (min) 33 min                 Past Medical History:  Diagnosis Date   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[place])    Chronic hepatitis B ([place])    Fixed pupils    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[place])    Hypertension    Intracranial injury of other and unspecified nature, without mention of open intracranial wound, unspecified state of consciousness    Intracranial shunt    Paralysis ([place])    Spastic hemiplegia affecting dominant side ([place])    Stroke ([place])    Traumatic brain injury ([place])    Trouble swallowing    Visual disturbance    Weakness    Past Surgical History:  Procedure Laterality Date   cramiectomy     CRANIOPLASTY     REMOVAL OF GASTROSTOMY TUBE     VENTRICULOPERITONEAL SHUNT     Patient Active Problem List   Diagnosis Date Noted   SIRS due to infectious process with acute organ dysfunction ([place]) 03/14/2021   SIRS (systemic inflammatory response syndrome) ([place]) 03/12/2021   Syncope 03/12/2021   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[place]) 03/12/2021   Hypokalemia 03/12/2021   Lactic acidosis 03/12/2021   Mixed hyperlipidemia 04/25/2018   Seizure disorder ([place]) 04/11/2018   Mechanical low back pain 05/12/2017   Dysarthria 12/20/2015   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/20/2012   Deafness, sensorineural  04/20/2012   Buzzing in ear 04/20/2012   Leaking percutaneous endoscopic gastrostomy (PEG) tube ([place]) 02/04/2012   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 pre-existing conscious levels, sequela ([place]) 10/12/2011   Cerebral infarct ([place]) 10/12/2011   Spastic hemiplegia of right dominant side due to noncerebrovascular etiology ([place]) 10/12/2011   Dysphagia 10/12/2011   Functioning G-tube 03/24/2011   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   Hemiplegia of dominant side ([place]) 07/28/2010   CEREBRAL HEMORRHAGE 07/28/2010   PERSONAL HISTORY OF TRAUMATIC BRAIN INJURY 07/02/2010   ALLERGIC RHINITIS CAUSE UNSPECIFIED 10/25/2008   GASTROENTERITIS 08/09/2008   CONTACT DERMATITIS&OTHER [facility] DUE DETERGENTS 08/18/2007   HEPATITIS B, CHRONIC 06/28/2007   Chronic viral hepatitis B without delta-agent ([place]) 06/28/2007    ONSET DATE: 03/04/22 (referral date)  REFERRING DIAG: G81.11 (ICD-10-CM) - Spastic hemiplegia of right dominant side due to noncerebrovascular etiology   THERAPY DIAG:  Spastic hemiplegia affecting right dominant side, unspecified etiology ([place])  Stiffness of right shoulder, not elsewhere classified  Stiffness of right elbow, not elsewhere classified  Stiffness of right hand, not elsewhere classified  Muscle weakness (generalized)  Unsteadiness on feet  Rationale for Evaluation and Treatment Rehabilitation  SUBJECTIVE:   SUBJECTIVE STATEMENT: Pt reports that he has a stationary bike at home.  Pt accompanied  by: self and family member (dad)  PERTINENT HISTORY: 1. History of traumatic brain injury with hydrocephalus, meningitis,   and right pontine stroke (2011) 2. Spastic tetraplegia, dominant side more affected 3. severe oropharyngeal dysphagia--improved--tolerating diet well.  Continues on thickened liquids 4. Absence seizures 5. Mechanical Low back pain: due to posture/altered gait patterns--improved 6. Left great  toe infection--podiatry f/u    PRECAUTIONS: Fall and Other: h/o seizures  WEIGHT BEARING RESTRICTIONS No  PAIN:  Are you having pain? No and does report pain in R elbow when he touches it, but does not hurt at rest  FALLS: Has patient fallen in last 6 months? Yes. Number of falls 4-5  LIVING ENVIRONMENT: Lives with: lives with their family Lives in: House/apartment Stairs: Yes: Internal: full flight to 2nd floor, but bedroom and bathroom on main floor and does not need to go up steps; can reach both and External: 3 steps; can reach both Has following equipment at home: shower chair and Grab bars  PLOF: Needs assistance with ADLs  PATIENT GOALS "arm" stronger and flexible  OBJECTIVE:   HAND DOMINANCE: Right  ADLs: Mobility: Mod I around the house, hand held assist when in the community Grooming: Mod I  UB Dressing: Mod I LB Dressing: family assists with donning AFO and tying shoes Toileting: Mod I - utilizes bidet for hygiene Bathing: family assists with bathing, he completes ~90% Tub Shower transfers: Supervision/assist with shower transfers Equipment: Shower seat with back, Grab bars, and Walk in shower  UPPER EXTREMITY ROM     Active ROM Right eval Left eval Right 04/21/22  Shoulder flexion 106  104  Shoulder abduction     Shoulder adduction     Shoulder extension     Shoulder internal rotation     Shoulder external rotation     Elbow flexion 123  132  Elbow extension -34  -38  Wrist flexion 12    Wrist extension -12    Wrist ulnar deviation     Wrist radial deviation     Wrist pronation     Wrist supination     (Blank rows = not tested)  Passive ROM Right eval Left eval Right 04/21/22  Shoulder flexion 110  115  Shoulder abduction     Shoulder adduction     Shoulder extension     Shoulder internal rotation     Shoulder external rotation     Elbow flexion 143  145  Elbow extension -32  -34  Wrist flexion 40    Wrist extension 34    Wrist ulnar  deviation     Wrist radial deviation     Wrist pronation     Wrist supination     (Blank rows = not tested)  HAND FUNCTION: Pt is able to manually open hand with great effort  MUSCLE TONE: RUE: Moderate, Hypertonic, and Modifed Ashworth Scale 3 = Considerable increase in muscle tone, passive movement difficult  COGNITION: Overall cognitive status: Impaired, History of cognitive impairments - at baseline, and father present providing majority of information --------------------------------------------------------------------------------------------------------------------------------------------------------  TODAY'S TREATMENT: 04/28/22 NMR/elbow extension: Engaged in rolling ball on mat table with RUE with focus on elbow extension and shoulder abduction.  OT providing tactile cues at torso to minimize lean and facilitate increased stretch. OT providing hand over hand assist to attempt to facilitate increased opening of hand.   PROM: OT providing PROM to R wrist with focus on flexion/extension. Unable to elicit any ulnar or radial deviation with PROM  due to tone.  Pt tolerating wrist flexion/extension with no c/o pain. AAROM: Closed-chain AAROM with SPC for elbow extension and shoulder flexion with forward flexion as well as circles in clockwise and counter-clockwise pattern.  OT providing initial hand over hand assist to facilitate technique, pt then able to continue with min compensatory strategies at trunk.  Transitioned to increased elbow extension/flexion with chest press and diagonal reaching.  OT providing cues to visually attend to RUE to increase extension during movements. Towel slides: OT providing hand over hand and mod facilitation for elbow flexion/extension and shoulder flexion with towel slides on table.  Pt demonstrating difficulty with flexion/extension due to increased tone.     04/21/22 NMR/stretch: WB through RUE on half ball to accommodate for decreased hand opening and  wrist extension. OT facilitating increased shoulder and elbow ROM with reaching outside BOS with LUE while maintaining WB through RUE.  OT with hand placement over R hand to obtain and maintain positioning and providing tactile cues for increased stretch. Saebo glide: OT facilitating supination and maintaining hand placement on Saebo glide while facilitating increased shoulder flexion and elbow extension at 45* angle. OT providing multimodal cues for posture due to tendency to compensate with trunk rotation and forward lean. PROM: supination/pronation and wrist flexion/extension with manual facilitation and hold for sustained stretch within pain tolerance.   04/16/22 Arm bike: 6 min total, alternating forward/backward every 2 min.  OT providing hand over hand assist at R hand to maintain grasp on arm bike.  Completed 6 mins on resistance level 1.0 complete 0.3 miles total. Supine shoulder: OT providing facilitation at shoulder and elbow for increased ROM.  Therapist providing intermittent verbal cues to attend to RUE to focus on increased elbow extension as able.  Utilized dowel for grasp and symmetry with chest press in supine x15. Wrist supination/pronation: Pt limited in use of Saebo glide due to decreased supination.  Therefore focused on PROM and stretch within tolerance.  OT educated father on hand placement and technique to facilitate stretch at home.  Discussed slow stretch to minimize effects of spasticity on stretch.    PATIENT EDUCATION: Education details: Reiterated HEP for shoulder, elbow, and forearm stretches Person educated: Patient and Parent Education method: Explanation, Demonstration, Corporate treasurer cues, and Handouts Education comprehension: needs further education   HOME EXERCISE PROGRAM: Access Code: [email or web address] URL: [email or web address] Date: 04/28/2022 Prepared by: [facility]  Exercises - Seated Shoulder Circles AAROM with  Dowel into Wall  - 1 x daily - 2 sets - 10 reps - Seated Shoulder Flexion Extension AAROM with Dowel into Wall  - 1 x daily - 2 sets - 10 reps - Seated Chest Press with Bar  - 1 x daily - 2 sets - 10 reps - Seated Shoulder External Rotation AAROM with Cane and Hand in Neutral  - 1 x daily - 2 sets - 10 reps - Seated Forearm Supination PROM with Caregiver  - 1 x daily - 2 sets - 10 reps - Seated Elbow Extension and Shoulder External Rotation AAROM at Table with Towel  - 1 x daily - 2 sets - 10 reps - Seated Shoulder Flexion Towel Slide at Table Top  - 1 x daily - 2 sets - 10 reps    GOALS: Goals reviewed with patient? Yes  SHORT TERM GOALS: Target date: 04/24/22  Pt and caregiver will be independent with HEP for shoulder and elbow ROM. Baseline: Pt not currently engaging in any  ROM/stretching program Goal status: MET - 04/21/22  2.  Pt will demonstrate improved active elbow extension to -30* to increase functional use Baseline: -34* Goal status: NOT MET - 04/21/22  3.  Pt will demonstrate improved shoulder flexion to 110* to increase functional use Baseline: 106* Goal status: NOT MET - 04/21/22   LONG TERM GOALS: Target date: 06/19/22  Pt will tolerate advanced HEP/stretching program for RUE. Baseline: No HEP at this time Goal status: IN PROGRESS  2.  Pt will demonstrate improved active elbow extension to -20* to increase functional use Baseline: -34* Goal status: IN PROGRESS  3.  Pt will demonstrate improved shoulder flexion to 115* to allow for improved ease with ADLs. Baseline: 106* Goal status: IN PROGRESS  4.  Pt will be independent with splint wear and care PRN Baseline: splinting needs TBD Goal status: IN PROGRESS  ASSESSMENT:  CLINICAL IMPRESSION: Treatment session with focus on progression of HEP with focus on shoulder flexion and elbow flexion/extension. Pt benefiting from hand over hand to maintain grasp and proper placement of RUE on dowel, ball, and towel due  to tone in wrist and fingers limiting sustained grasp. Pt tolerating ROM and stretch with no reports of pain.  PERFORMANCE DEFICITS in functional skills including ADLs, IADLs, coordination, dexterity, tone, ROM, strength, pain, flexibility, FMC, GMC, decreased knowledge of precautions, and UE functional use, cognitive skills including learn, memory, and safety awareness.  IMPAIRMENTS are limiting patient from ADLs and IADLs.   COMORBIDITIES may have co-morbidities  that affects occupational performance. Patient will benefit from skilled OT to address above impairments and improve overall function.  MODIFICATION OR ASSISTANCE TO COMPLETE EVALUATION: Min-Moderate modification of tasks or assist with assess necessary to complete an evaluation.  OT OCCUPATIONAL PROFILE AND HISTORY: Detailed assessment: Review of records and additional review of physical, cognitive, psychosocial history related to current functional performance.  CLINICAL DECISION MAKING: Moderate - several treatment options, min-mod task modification necessary  REHAB POTENTIAL: Fair ongoing spasticity  EVALUATION COMPLEXITY: Moderate    PLAN: OT FREQUENCY: 1x/week  OT DURATION: 12 weeks (Plan for 8 visits over 12 weeks, but asking 12 weeks to allow for time to benefit upcoming botox injections)  PLANNED INTERVENTIONS: self care/ADL training, therapeutic exercise, therapeutic activity, neuromuscular re-education, manual therapy, passive range of motion, aquatic therapy, splinting, moist heat, cryotherapy, and patient/family education  RECOMMENDED OTHER SERVICES: may benefit from aquatic therapy OT  CONSULTED AND AGREED WITH PLAN OF CARE: Patient and family member/caregiver  PLAN FOR NEXT SESSION: review HEP and begin PROM and self-ROM for shoulder, elbow, wrist ROM.   Abdulkarim Eberlin, [place], OTR/L 04/28/2022, 8:15 AM

## 2022-04-28 NOTE — Progress Notes (Signed)
Subjective:   Patient ID: Paul Kane, male   DOB: 36 y.o.   MRN: [id number]   HPI Patient presents with caregiver concerned about crusted tissue on the outside of the right foot wants it checked   ROS      Objective:  Physical Exam  Nerve neurovascular status intact area of crusted tissue base of fifth metatarsal right with patient wearing a brace not communicative but has father with     Assessment:  Moderate crusted abrasion right lateral foot localized     Plan:  Reviewed condition do not recommend any treatment currently except for bandage usage and if it were to start to drain patient will be seen back

## 2022-04-28 NOTE — Therapy (Signed)
OUTPATIENT PHYSICAL THERAPY NEURO EVALUATION   Patient Name: Paul Kane MRN: [id number] DOB:[birth year], 36 y.o., male Today's Date: 04/28/2022   PCP: Caswell Corwin REFERRING PROVIDER: Meredith Staggers, MD    PT End of Session - 04/28/22 0848     Visit Number 6    Number of Visits 12    Date for PT Re-Evaluation 06/18/22    Authorization Type BCBS/ Medicaid of [place] for secondary    Authorization Time Period 11 visits from 10/10-12/25/23    Authorization - Visit Number 1    Authorization - Number of Visits 11    PT Start Time 0847    PT Stop Time 0930    PT Time Calculation (min) 43 min    Equipment Utilized During Treatment Gait belt    Activity Tolerance Patient tolerated treatment well    Behavior During Therapy [facility] for tasks assessed/performed;Impulsive   frequent redirection to task             Past Medical History:  Diagnosis Date   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[place])    Chronic hepatitis B ([place])    Fixed pupils    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[place])    Hypertension    Intracranial injury of other and unspecified nature, without mention of open intracranial wound, unspecified state of consciousness    Intracranial shunt    Paralysis ([place])    Spastic hemiplegia affecting dominant side ([place])    Stroke ([place])    Traumatic brain injury ([place])    Trouble swallowing    Visual disturbance    Weakness    Past Surgical History:  Procedure Laterality Date   cramiectomy     CRANIOPLASTY     REMOVAL OF GASTROSTOMY TUBE     VENTRICULOPERITONEAL SHUNT     Patient Active Problem List   Diagnosis Date Noted   SIRS due to infectious process with acute organ dysfunction ([place]) 03/14/2021   SIRS (systemic inflammatory response syndrome) ([place]) 03/12/2021   Syncope 03/12/2021   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[place]) 03/12/2021   Hypokalemia 03/12/2021   Lactic acidosis 03/12/2021   Mixed hyperlipidemia 04/25/2018   Seizure disorder ([place])  04/11/2018   Mechanical low back pain 05/12/2017   Dysarthria 12/20/2015   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/20/2012   Deafness, sensorineural 04/20/2012   Buzzing in ear 04/20/2012   Leaking percutaneous endoscopic gastrostomy (PEG) tube ([place]) 02/04/2012   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 pre-existing conscious levels, sequela ([place]) 10/12/2011   Cerebral infarct ([place]) 10/12/2011   Spastic hemiplegia of right dominant side due to noncerebrovascular etiology ([place]) 10/12/2011   Dysphagia 10/12/2011   Functioning G-tube 03/24/2011   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   Hemiplegia of dominant side ([place]) 07/28/2010   CEREBRAL HEMORRHAGE 07/28/2010   PERSONAL HISTORY OF TRAUMATIC BRAIN INJURY 07/02/2010   ALLERGIC RHINITIS CAUSE UNSPECIFIED 10/25/2008   GASTROENTERITIS 08/09/2008   CONTACT DERMATITIS&OTHER ECZEMA DUE DETERGENTS 08/18/2007   HEPATITIS B, CHRONIC 06/28/2007   Chronic viral hepatitis B without delta-agent ([place]) 06/28/2007    ONSET DATE: 2012  REFERRING DIAG: G81.11 (ICD-10-CM) - Spastic hemiplegia of right dominant side due to noncerebrovascular etiology   THERAPY DIAG:  Spastic hemiplegia affecting right dominant side, unspecified etiology ([place])  Muscle weakness (generalized)  Unsteadiness on feet  Difficulty in walking, not elsewhere classified  Other abnormalities of gait and mobility  Rationale for Evaluation and Treatment Rehabilitation  SUBJECTIVE:  SUBJECTIVE STATEMENT: Foot is feeling better and able to tolerate wearing brace/shoe  Pt accompanied by: family member father  PERTINENT HISTORY: . History of traumatic brain injury with hydrocephalus, meningitis,   and right pontine stroke.   2. Spastic tetraplegia,  dominant side more affected 3. severe oropharyngeal dysphagia--improved--tolerating diet well.  Continues on thickened liquids 4. Absence seizures 5. Mechanical Low back pain: due to posture/altered gait patterns--improved 6. Left great toe infection--podiatry f/u  PAIN:  Are you having pain? Yes.  Pt indicates right buttock area, no pain exhibited during resisted tests and pt denies pain during functional mobility and fall risk tests.  Denies antalgia  PRECAUTIONS: Fall  WEIGHT BEARING RESTRICTIONS No  FALLS: Has patient fallen in last 6 months? Yes. Number of falls 2  LIVING ENVIRONMENT: Lives with: lives with their family Lives in: House/apartment Stairs: Yes: Internal: 12 steps; can reach both and External: 3 steps; can reach both Has following equipment at home: Grab bars and None  PLOF: Needs assistance with ADLs and Needs assistance with homemaking supervision for outdoor ambulation  PATIENT GOALS  Have a maintenance program  OBJECTIVE:   TODAY'S TREATMENT: 04/28/22 Activity Comments  Sidestepping x 2 min 5# RLE  Bosu taps 3x10 5# RLE for hip flex/limb clearance  Bosu lunges RLE, therapist facilitating knee flexion drive to extension for hamstring stretch   Seated LAQ 3x10  5#  Alt taps whilst circling clockwise/counterclockwise around target   Soccer ball passing Therapist facilitating weight shifting and righting reactions  Standing on foam -EO/EC 2x30 sec -head turns 2x5 reps Therapist facilitating right lateral shift and instances for right knee extension/stabilization     TODAY'S TREATMENT:   Pt seen for aquatic therapy today.  Treatment took place in water 3.25-4.5 ft in depth at the [facility]. Temp of water was 91.  Pt entered/exited the pool via stairs with cga and hand rail using step to pattern.  *Walking forward back and sideways all depths ue supported by yellow hand buoy *Holding to wall: df; pf; add/abd; squats, hip flex; hip ext     *Seated LAQ; march *STS from bench onto step hha then decreased assistance to cga/sba. Pt gaining immediate standing balance 5/10x *Bottom step right gastroc stretch  Pt requires moderate verbal cuing, demonstration and cga throughout for safety  Pt requires the buoyancy and hydrostatic pressure of water for support, and to offload joints by unweighting joint load by at least 50 % in navel deep water and by at least 75-80% in chest to neck deep water.  Viscosity of the water is needed for resistance of strengthening. Water current perturbations provides challenge to standing balance requiring increased core activation.         DIAGNOSTIC FINDINGS:   COGNITION: Overall cognitive status: History of cognitive impairments - at baseline   SENSATION: Not tested  COORDINATION: RLE impaired  EDEMA:  none  MUSCLE TONE: RLE: Hypertonic, no ankle clonus detected   MUSCLE LENGTH: No obvious restrictions  DTRs:    POSTURE: weight shift left  LOWER EXTREMITY ROM:     Passive  Right Eval Left Eval  Hip flexion    Hip extension    Hip abduction    Hip adduction    Hip internal rotation    Hip external rotation    Knee flexion    Knee extension    Ankle dorsiflexion -9 WNL  Ankle plantarflexion    Ankle inversion    Ankle eversion     (Blank  rows = not tested)  ---father reports that they have a static, progressive (JAS) brace that they use x 30 min daily on right ankle for dorsiflexion ROM  LOWER EXTREMITY MMT:    MMT Right Eval Left Eval  Hip flexion 5 4  Hip extension    Hip abduction 5 4  Hip adduction 5 4  Hip internal rotation    Hip external rotation    Knee flexion 5 4  Knee extension 5 4  Ankle dorsiflexion 5 4  Ankle plantarflexion    Ankle inversion 5 4  Ankle eversion 5 3  (Blank rows = not tested)  BED MOBILITY:  Independent  TRANSFERS: Assistive device utilized: None  Sit to stand: Complete Independence and Modified independence Stand  to sit: Complete Independence Chair to chair: Complete Independence and Modified independence Floor:  DNT   CURB:  Level of Assistance: SBA and CGA Assistive device utilized: None Curb Comments: CGA  STAIRS:  Level of Assistance: SBA  Stair Negotiation Technique: Step to Pattern with Bilateral Rails  Number of Stairs: 6   Height of Stairs: 4-6"  Comments: right knee wobble w/ descending  GAIT: Gait pattern: trunk rotated posterior- Right, knee extensor thrust Distance walked:  Assistive device utilized: None, right ankle hinged [place] Level of assistance: Modified independence and SBA Comments: notes right ankle inversion when walking barefoot  FUNCTIONAL TESTs:  5 times sit to stand: 19.10 sec--bias to LLE Timed up and go (TUG): DNT Berg Balance Scale: 32/56 10 meter walk test: 12.39 sec = 2.6 ft/sec   PATIENT EDUCATION: Education details: regarding assessment findings and rationale of PT intervention Person educated: Patient and Parent Education method: Explanation Education comprehension: verbalized understanding   HOME EXERCISE PROGRAM: Access Code: [email or web address] URL: [email or web address] Date: 03/26/2022 Prepared by: Sherlyn Lees  Exercises - Seated Ankle Eversion with Resistance  - 1 x daily - 7 x weekly - 3 sets - 10 reps -Recumbent stationary bicycling    GOALS: Goals reviewed with patient? Yes  SHORT TERM GOALS: Target date: 05/07/2022  Patient will perform HEP with family/caregiver supervision for improved strength, balance, transfers, and gait  Baseline: in development to incorporate land and aquatic-based interventions Goal status: On-going,    LONG TERM GOALS: Target date: 06/18/2022  Reduce risk for falls per score 45/56 Berg Balance Test Baseline: 32/56; (04/16/22) 40/56 Goal status: Goal Met, UPGRADE  2.  Increase right eversion strength to 4/5 to improve ankle posture and decrease inversion when walking barefoot to reduce  risk for fall/injury Baseline: 3/5; (04/16/22) 3+/5  Goal status: on-going  3.  Improve BLE strength and balance per time of 12 sec 5xSTS to reduce risk for falls Baseline: 19+ sec with LLE bias/weight shift; (04/16/22) 13.78 sec Goal status: Goal met, UPGRADE    ASSESSMENT:  CLINICAL IMPRESSION: Progressed with dynamic balance activities today with use of weight on RLE for recruitment and to provide for error correction of self-monitoring with good response.  Difficulty with two-step motor control activity (alt tapping and sidestepping around target) resulting in multiple instances of LOB when RLE would cross midline and result in lateral LOB/fall requiring physical support to prevent fall and correct position.  Continued sessions inidcated to advance motor control/coordination and strength to reduce risk for falls and train in gym program for caregiver and pt to trial      OBJECTIVE IMPAIRMENTS Abnormal gait, decreased balance, decreased coordination, difficulty walking, decreased strength, improper body mechanics, and postural dysfunction.   ACTIVITY LIMITATIONS carrying, lifting, bending,  squatting, stairs, and locomotion level  PARTICIPATION LIMITATIONS: cleaning, interpersonal relationship, community activity, and yard work  PERSONAL FACTORS Time since onset of injury/illness/exacerbation and 1 comorbidity: hx of TBI  are also affecting patient's functional outcome.   REHAB POTENTIAL: Good  CLINICAL DECISION MAKING: Evolving/moderate complexity  EVALUATION COMPLEXITY: Moderate  PLAN: PT FREQUENCY: 1x/week  PT DURATION: 12 weeks  PLANNED INTERVENTIONS: Therapeutic exercises, Therapeutic activity, Neuromuscular re-education, Balance training, Gait training, Patient/Family education, Self Care, Joint mobilization, Stair training, Vestibular training, Canalith repositioning, Orthotic/Fit training, DME instructions, Aquatic Therapy, Electrical stimulation, Wheelchair mobility  training, Spinal mobilization, Cryotherapy, Moist heat, Taping, Ionotophoresis 65m/ml Dexamethasone, and Manual therapy  PLAN FOR NEXT SESSION: HEP for gym routine   9:57 AM, 04/28/22 M. KSherlyn Lees PT, DPT Physical Therapist- CThornhillOffice Number: [phone]

## 2022-05-05 ENCOUNTER — Encounter (HOSPITAL_BASED_OUTPATIENT_CLINIC_OR_DEPARTMENT_OTHER): Payer: Self-pay | Admitting: Physical Therapy

## 2022-05-05 ENCOUNTER — Ambulatory Visit: Payer: BC Managed Care – PPO | Admitting: Occupational Therapy

## 2022-05-05 ENCOUNTER — Ambulatory Visit (HOSPITAL_BASED_OUTPATIENT_CLINIC_OR_DEPARTMENT_OTHER): Payer: BC Managed Care – PPO | Admitting: Physical Therapy

## 2022-05-05 DIAGNOSIS — M25621 Stiffness of right elbow, not elsewhere classified: Secondary | ICD-10-CM

## 2022-05-05 DIAGNOSIS — M25641 Stiffness of right hand, not elsewhere classified: Secondary | ICD-10-CM

## 2022-05-05 DIAGNOSIS — M6281 Muscle weakness (generalized): Secondary | ICD-10-CM

## 2022-05-05 DIAGNOSIS — R2681 Unsteadiness on feet: Secondary | ICD-10-CM

## 2022-05-05 DIAGNOSIS — M25611 Stiffness of right shoulder, not elsewhere classified: Secondary | ICD-10-CM

## 2022-05-05 DIAGNOSIS — G8111 Spastic hemiplegia affecting right dominant side: Secondary | ICD-10-CM

## 2022-05-05 NOTE — Therapy (Signed)
OUTPATIENT PHYSICAL THERAPY NEURO EVALUATION   Patient Name: Paul Kane MRN: [id number] DOB:1986-07-20, 36 y.o., male Today's Date: 05/05/2022   PCP: Caswell Corwin REFERRING PROVIDER: Meredith Staggers, MD    PT End of Session - 05/05/22 1336     Visit Number 7    Number of Visits 12    Date for PT Re-Evaluation 06/18/22    Authorization Type BCBS/ Medicaid of [place] for secondary    Authorization Time Period 11 visits from 10/10-12/25/23    Authorization - Visit Number 1    Authorization - Number of Visits 11    PT Start Time 0945    PT Stop Time 1025    PT Time Calculation (min) 40 min    Equipment Utilized During Treatment Gait belt    Activity Tolerance Patient tolerated treatment well    Behavior During Therapy [facility] for tasks assessed/performed;Impulsive   frequent redirection to task             Past Medical History:  Diagnosis Date   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[place])    Chronic hepatitis B ([place])    Fixed pupils    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[place])    Hypertension    Intracranial injury of other and unspecified nature, without mention of open intracranial wound, unspecified state of consciousness    Intracranial shunt    Paralysis ([place])    Spastic hemiplegia affecting dominant side ([place])    Stroke ([place])    Traumatic brain injury ([place])    Trouble swallowing    Visual disturbance    Weakness    Past Surgical History:  Procedure Laterality Date   cramiectomy     CRANIOPLASTY     REMOVAL OF GASTROSTOMY TUBE     VENTRICULOPERITONEAL SHUNT     Patient Active Problem List   Diagnosis Date Noted   SIRS due to infectious process with acute organ dysfunction ([place]) 03/14/2021   SIRS (systemic inflammatory response syndrome) ([place]) 03/12/2021   Syncope 03/12/2021   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[place]) 03/12/2021   Hypokalemia 03/12/2021   Lactic acidosis 03/12/2021   Mixed hyperlipidemia 04/25/2018   Seizure disorder ([place])  04/11/2018   Mechanical low back pain 05/12/2017   Dysarthria 12/20/2015   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/20/2012   Deafness, sensorineural 04/20/2012   Buzzing in ear 04/20/2012   Leaking percutaneous endoscopic gastrostomy (PEG) tube ([place]) 02/04/2012   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 pre-existing conscious levels, sequela ([place]) 10/12/2011   Cerebral infarct ([place]) 10/12/2011   Spastic hemiplegia of right dominant side due to noncerebrovascular etiology ([place]) 10/12/2011   Dysphagia 10/12/2011   Functioning G-tube 03/24/2011   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   Hemiplegia of dominant side ([place]) 07/28/2010   CEREBRAL HEMORRHAGE 07/28/2010   PERSONAL HISTORY OF TRAUMATIC BRAIN INJURY 07/02/2010   ALLERGIC RHINITIS CAUSE UNSPECIFIED 10/25/2008   GASTROENTERITIS 08/09/2008   CONTACT DERMATITIS&OTHER ECZEMA DUE DETERGENTS 08/18/2007   HEPATITIS B, CHRONIC 06/28/2007   Chronic viral hepatitis B without delta-agent ([place]) 06/28/2007    ONSET DATE: 2012  REFERRING DIAG: G81.11 (ICD-10-CM) - Spastic hemiplegia of right dominant side due to noncerebrovascular etiology   THERAPY DIAG:  Spastic hemiplegia affecting right dominant side, unspecified etiology ([place])  Muscle weakness (generalized)  Unsteadiness on feet  Rationale for Evaluation and Treatment Rehabilitation  SUBJECTIVE:  SUBJECTIVE STATEMENT: Foot is feeling better and able to tolerate wearing brace/shoe  Pt accompanied by: family member father  PERTINENT HISTORY: . History of traumatic brain injury with hydrocephalus, meningitis,   and right pontine stroke.   2. Spastic tetraplegia, dominant side more affected 3. severe oropharyngeal dysphagia--improved--tolerating diet  well.  Continues on thickened liquids 4. Absence seizures 5. Mechanical Low back pain: due to posture/altered gait patterns--improved 6. Left great toe infection--podiatry f/u  PAIN:  Are you having pain? Yes.  Pt indicates right buttock area, no pain exhibited during resisted tests and pt denies pain during functional mobility and fall risk tests.  Denies antalgia  PRECAUTIONS: Fall  WEIGHT BEARING RESTRICTIONS No  FALLS: Has patient fallen in last 6 months? Yes. Number of falls 2  LIVING ENVIRONMENT: Lives with: lives with their family Lives in: House/apartment Stairs: Yes: Internal: 12 steps; can reach both and External: 3 steps; can reach both Has following equipment at home: Grab bars and None  PLOF: Needs assistance with ADLs and Needs assistance with homemaking supervision for outdoor ambulation  PATIENT GOALS  Have a maintenance program  OBJECTIVE:   TODAY'S TREATMENT: 05/05/22   Pt seen for aquatic therapy today.  Treatment took place in water 3.25-4.5 ft in depth at the [facility]. Temp of water was 92.  Pt entered/exited the pool via stairs with cga and hand rail using step to pattern.  *Walking forward back and sideways all depths unsupported multiple widths *Holding to wall: df; pf; add/abd; squats, hip flex; hip ext.  Therapist assisting with rue hold on wall  *Seated LAQ; march. Cues for core engagement to maintain seated position on bench. *STS from bench onto pool bottom x 10; STS onto step with encouragement to be unassisted.  Requires min assist 6/10 x  Pt gaining immediate standing balance 4/10x *Standing on water step squats x 5, min assist and cues for balance *Step ups leading with rle x 5.  Pt requires moderate verbal cuing, demonstration and cga throughout for safety with therapist instructing from deck, cl supervision therapist sitting poolside  Pt requires the buoyancy and hydrostatic pressure of water for support, and to offload  joints by unweighting joint load by at least 50 % in navel deep water and by at least 75-80% in chest to neck deep water.  Viscosity of the water is needed for resistance of strengthening. Water current perturbations provides challenge to standing balance requiring increased core activation.       04/28/22 Activity Comments  Sidestepping x 2 min 5# RLE  Bosu taps 3x10 5# RLE for hip flex/limb clearance  Bosu lunges RLE, therapist facilitating knee flexion drive to extension for hamstring stretch   Seated LAQ 3x10  5#  Alt taps whilst circling clockwise/counterclockwise around target   Soccer ball passing Therapist facilitating weight shifting and righting reactions  Standing on foam -EO/EC 2x30 sec -head turns 2x5 reps Therapist facilitating right lateral shift and instances for right knee extension/stabilization     TODAY'S TREATMENT:           DIAGNOSTIC FINDINGS:   COGNITION: Overall cognitive status: History of cognitive impairments - at baseline   SENSATION: Not tested  COORDINATION: RLE impaired  EDEMA:  none  MUSCLE TONE: RLE: Hypertonic, no ankle clonus detected   MUSCLE LENGTH: No obvious restrictions  DTRs:    POSTURE: weight shift left  LOWER EXTREMITY ROM:     Passive  Right Eval Left Eval  Hip flexion  Hip extension    Hip abduction    Hip adduction    Hip internal rotation    Hip external rotation    Knee flexion    Knee extension    Ankle dorsiflexion -9 WNL  Ankle plantarflexion    Ankle inversion    Ankle eversion     (Blank rows = not tested)  ---father reports that they have a static, progressive (JAS) brace that they use x 30 min daily on right ankle for dorsiflexion ROM  LOWER EXTREMITY MMT:    MMT Right Eval Left Eval  Hip flexion 5 4  Hip extension    Hip abduction 5 4  Hip adduction 5 4  Hip internal rotation    Hip external rotation    Knee flexion 5 4  Knee extension 5 4  Ankle dorsiflexion 5 4  Ankle  plantarflexion    Ankle inversion 5 4  Ankle eversion 5 3  (Blank rows = not tested)  BED MOBILITY:  Independent  TRANSFERS: Assistive device utilized: None  Sit to stand: Complete Independence and Modified independence Stand to sit: Complete Independence Chair to chair: Complete Independence and Modified independence Floor:  DNT   CURB:  Level of Assistance: SBA and CGA Assistive device utilized: None Curb Comments: CGA  STAIRS:  Level of Assistance: SBA  Stair Negotiation Technique: Step to Pattern with Bilateral Rails  Number of Stairs: 6   Height of Stairs: 4-6"  Comments: right knee wobble w/ descending  GAIT: Gait pattern: trunk rotated posterior- Right, knee extensor thrust Distance walked:  Assistive device utilized: None, right ankle hinged [place] Level of assistance: Modified independence and SBA Comments: notes right ankle inversion when walking barefoot  FUNCTIONAL TESTs:  5 times sit to stand: 19.10 sec--bias to LLE Timed up and go (TUG): DNT Berg Balance Scale: 32/56 10 meter walk test: 12.39 sec = 2.6 ft/sec   PATIENT EDUCATION: Education details: regarding assessment findings and rationale of PT intervention Person educated: Patient and Parent Education method: Explanation Education comprehension: verbalized understanding   HOME EXERCISE PROGRAM: Access Code: [email or web address] URL: [email or web address] Date: 03/26/2022 Prepared by: Sherlyn Lees  Exercises - Seated Ankle Eversion with Resistance  - 1 x daily - 7 x weekly - 3 sets - 10 reps -Recumbent stationary bicycling    GOALS: Goals reviewed with patient? Yes  SHORT TERM GOALS: Target date: 05/07/2022  Patient will perform HEP with family/caregiver supervision for improved strength, balance, transfers, and gait  Baseline: in development to incorporate land and aquatic-based interventions Goal status: On-going,    LONG TERM GOALS: Target date: 06/18/2022  Reduce risk  for falls per score 45/56 Berg Balance Test Baseline: 32/56; (04/16/22) 40/56 Goal status: Goal Met, UPGRADE  2.  Increase right eversion strength to 4/5 to improve ankle posture and decrease inversion when walking barefoot to reduce risk for fall/injury Baseline: 3/5; (04/16/22) 3+/5  Goal status: on-going  3.  Improve BLE strength and balance per time of 12 sec 5xSTS to reduce risk for falls Baseline: 19+ sec with LLE bias/weight shift; (04/16/22) 13.78 sec Goal status: Goal met, UPGRADE    ASSESSMENT:  CLINICAL IMPRESSION: Progressed balance challenges with pt having good toleration.  He is able to maintain standing balance with improvement when able to improve time on task/decrease impulsivity. He has become more confident in setting being able to maneuver around pool indep.  He does have episode of LOB which he recovers from indep.  Excellent setting for pt to progress towards  meeting goals on land as it provides decreased risk of falling. Plan to add weight to LLE for approximation next aquatic session  OBJECTIVE IMPAIRMENTS Abnormal gait, decreased balance, decreased coordination, difficulty walking, decreased strength, improper body mechanics, and postural dysfunction.   ACTIVITY LIMITATIONS carrying, lifting, bending, squatting, stairs, and locomotion level  PARTICIPATION LIMITATIONS: cleaning, interpersonal relationship, community activity, and yard work  PERSONAL FACTORS Time since onset of injury/illness/exacerbation and 1 comorbidity: hx of TBI  are also affecting patient's functional outcome.   REHAB POTENTIAL: Good  CLINICAL DECISION MAKING: Evolving/moderate complexity  EVALUATION COMPLEXITY: Moderate  PLAN: PT FREQUENCY: 1x/week  PT DURATION: 12 weeks  PLANNED INTERVENTIONS: Therapeutic exercises, Therapeutic activity, Neuromuscular re-education, Balance training, Gait training, Patient/Family education, Self Care, Joint mobilization, Stair training, Vestibular  training, Canalith repositioning, Orthotic/Fit training, DME instructions, Aquatic Therapy, Electrical stimulation, Wheelchair mobility training, Spinal mobilization, Cryotherapy, Moist heat, Taping, Ionotophoresis 75m/ml Dexamethasone, and Manual therapy  PLAN FOR NEXT SESSION: HEP for gym routine   1:38 PM, 05/05/22 MStanton Kidney([place] Aquas [email or web address]

## 2022-05-05 NOTE — Therapy (Signed)
OUTPATIENT OCCUPATIONAL THERAPY  Treatment Note  Patient Name: Paul Kane MRN: [id number] DOB:1986/02/11, 36 y.o., male Today's Date: 05/05/2022  PCP: Jonathon Bellows, [place] PROVIDER: Meredith Staggers, MD   OT End of Session - 05/05/22 [phone]     Visit Number 6    Number of Visits 9    Date for OT Re-Evaluation 06/19/22    Authorization Type [employer]BS primary/ [place] Medicaid secondary    Authorization Time Period Auth#: [id number] , approved for 8 OT visits from 04/02/2022-06/24/2022    Authorization - Visit Number 5    Authorization - Number of Visits 8    OT Start Time 0809    OT Stop Time 6553    OT Time Calculation (min) 38 min                  Past Medical History:  Diagnosis Date   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[place])    Chronic hepatitis B ([place])    Fixed pupils    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[place])    Hypertension    Intracranial injury of other and unspecified nature, without mention of open intracranial wound, unspecified state of consciousness    Intracranial shunt    Paralysis ([place])    Spastic hemiplegia affecting dominant side ([place])    Stroke ([place])    Traumatic brain injury ([place])    Trouble swallowing    Visual disturbance    Weakness    Past Surgical History:  Procedure Laterality Date   cramiectomy     CRANIOPLASTY     REMOVAL OF GASTROSTOMY TUBE     VENTRICULOPERITONEAL SHUNT     Patient Active Problem List   Diagnosis Date Noted   SIRS due to infectious process with acute organ dysfunction ([place]) 03/14/2021   SIRS (systemic inflammatory response syndrome) ([place]) 03/12/2021   Syncope 03/12/2021   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[place]) 03/12/2021   Hypokalemia 03/12/2021   Lactic acidosis 03/12/2021   Mixed hyperlipidemia 04/25/2018   Seizure disorder ([place]) 04/11/2018   Mechanical low back pain 05/12/2017   Dysarthria 12/20/2015   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/20/2012   Deafness, sensorineural  04/20/2012   Buzzing in ear 04/20/2012   Leaking percutaneous endoscopic gastrostomy (PEG) tube ([place]) 02/04/2012   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 pre-existing conscious levels, sequela ([place]) 10/12/2011   Cerebral infarct ([place]) 10/12/2011   Spastic hemiplegia of right dominant side due to noncerebrovascular etiology ([place]) 10/12/2011   Dysphagia 10/12/2011   Functioning G-tube 03/24/2011   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   Hemiplegia of dominant side ([place]) 07/28/2010   CEREBRAL HEMORRHAGE 07/28/2010   PERSONAL HISTORY OF TRAUMATIC BRAIN INJURY 07/02/2010   ALLERGIC RHINITIS CAUSE UNSPECIFIED 10/25/2008   GASTROENTERITIS 08/09/2008   CONTACT DERMATITIS&OTHER ECZEMA DUE DETERGENTS 08/18/2007   HEPATITIS B, CHRONIC 06/28/2007   Chronic viral hepatitis B without delta-agent ([place]) 06/28/2007    ONSET DATE: 03/04/22 (referral date)  REFERRING DIAG: G81.11 (ICD-10-CM) - Spastic hemiplegia of right dominant side due to noncerebrovascular etiology   THERAPY DIAG:  Spastic hemiplegia affecting right dominant side, unspecified etiology ([place])  Muscle weakness (generalized)  Unsteadiness on feet  Stiffness of right shoulder, not elsewhere classified  Stiffness of right elbow, not elsewhere classified  Stiffness of right hand, not elsewhere classified  Rationale for Evaluation and Treatment Rehabilitation  SUBJECTIVE:   SUBJECTIVE STATEMENT: Pt reports that he has aquatic therapy after this appt.  Pt  accompanied by: self and family member (dad)  PERTINENT HISTORY: 1. History of traumatic brain injury with hydrocephalus, meningitis,   and right pontine stroke (2011) 2. Spastic tetraplegia, dominant side more affected 3. severe oropharyngeal dysphagia--improved--tolerating diet well.  Continues on thickened liquids 4. Absence seizures 5. Mechanical Low back pain: due to posture/altered gait patterns--improved 6. Left  great toe infection--podiatry f/u    PRECAUTIONS: Fall and Other: h/o seizures  WEIGHT BEARING RESTRICTIONS No  PAIN:  Are you having pain? No and does report pain in R foot, unable to rate  FALLS: Has patient fallen in last 6 months? Yes. Number of falls 4-5  LIVING ENVIRONMENT: Lives with: lives with their family Lives in: House/apartment Stairs: Yes: Internal: full flight to 2nd floor, but bedroom and bathroom on main floor and does not need to go up steps; can reach both and External: 3 steps; can reach both Has following equipment at home: shower chair and Grab bars  PLOF: Needs assistance with ADLs  PATIENT GOALS "arm" stronger and flexible  OBJECTIVE:   HAND DOMINANCE: Right  ADLs: Mobility: Mod I around the house, hand held assist when in the community Grooming: Mod I  UB Dressing: Mod I LB Dressing: family assists with donning AFO and tying shoes Toileting: Mod I - utilizes bidet for hygiene Bathing: family assists with bathing, he completes ~90% Tub Shower transfers: Supervision/assist with shower transfers Equipment: Shower seat with back, Grab bars, and Walk in shower  UPPER EXTREMITY ROM     Active ROM Right eval Left eval Right 04/21/22  Shoulder flexion 106  104  Shoulder abduction     Shoulder adduction     Shoulder extension     Shoulder internal rotation     Shoulder external rotation     Elbow flexion 123  132  Elbow extension -34  -38  Wrist flexion 12    Wrist extension -12    Wrist ulnar deviation     Wrist radial deviation     Wrist pronation     Wrist supination     (Blank rows = not tested)  Passive ROM Right eval Left eval Right 04/21/22  Shoulder flexion 110  115  Shoulder abduction     Shoulder adduction     Shoulder extension     Shoulder internal rotation     Shoulder external rotation     Elbow flexion 143  145  Elbow extension -32  -34  Wrist flexion 40    Wrist extension 34    Wrist ulnar deviation     Wrist  radial deviation     Wrist pronation     Wrist supination     (Blank rows = not tested)  HAND FUNCTION: Pt is able to manually open hand with great effort  MUSCLE TONE: RUE: Moderate, Hypertonic, and Modifed Ashworth Scale 3 = Considerable increase in muscle tone, passive movement difficult  COGNITION: Overall cognitive status: Impaired, History of cognitive impairments - at baseline, and father present providing majority of information --------------------------------------------------------------------------------------------------------------------------------------------------------  TODAY'S TREATMENT:  05/05/22 AAROM: Closed- chain AAROM with hand over hand while rolling large therapy ball forward and then horizontally with focus on shoulder flexion, elbow extension, and horizontal abduction/adduction. AAROM: Closed -chain AAROM while holding hula hoop with focus on chest press, internal/external rotation, and shoulder flexion.  OT providing hand over hand to maintain grasp on hula hoop, progressing to taping with coban to maintain grasp during exercise.  Pt continues to demonstrate decreased elbow extension  and shoulder flexion, especially when attempting to reach overhead. Wall slides: hand over hand wall slides with focus on shoulder flexion and diagonal reaching. Pt able to achieve increased shoulder flexion with wall slides and sustained reach for 3-5 seconds in flexion.    04/28/22 NMR/elbow extension: Engaged in rolling ball on mat table with RUE with focus on elbow extension and shoulder abduction.  OT providing tactile cues at torso to minimize lean and facilitate increased stretch. OT providing hand over hand assist to attempt to facilitate increased opening of hand.   PROM: OT providing PROM to R wrist with focus on flexion/extension. Unable to elicit any ulnar or radial deviation with PROM due to tone.  Pt tolerating wrist flexion/extension with no c/o pain. AAROM:  Closed-chain AAROM with SPC for elbow extension and shoulder flexion with forward flexion as well as circles in clockwise and counter-clockwise pattern.  OT providing initial hand over hand assist to facilitate technique, pt then able to continue with min compensatory strategies at trunk.  Transitioned to increased elbow extension/flexion with chest press and diagonal reaching.  OT providing cues to visually attend to RUE to increase extension during movements. Towel slides: OT providing hand over hand and mod facilitation for elbow flexion/extension and shoulder flexion with towel slides on table.  Pt demonstrating difficulty with flexion/extension due to increased tone.     04/21/22 NMR/stretch: WB through RUE on half ball to accommodate for decreased hand opening and wrist extension. OT facilitating increased shoulder and elbow ROM with reaching outside BOS with LUE while maintaining WB through RUE.  OT with hand placement over R hand to obtain and maintain positioning and providing tactile cues for increased stretch. Saebo glide: OT facilitating supination and maintaining hand placement on Saebo glide while facilitating increased shoulder flexion and elbow extension at 45* angle. OT providing multimodal cues for posture due to tendency to compensate with trunk rotation and forward lean. PROM: supination/pronation and wrist flexion/extension with manual facilitation and hold for sustained stretch within pain tolerance.    PATIENT EDUCATION: Education details: Reiterated HEP for shoulder, elbow, and forearm stretches Person educated: Patient and Parent Education method: Explanation, Demonstration, Corporate treasurer cues, and Handouts Education comprehension: needs further education   HOME EXERCISE PROGRAM: Access Code: [email or web address] URL: [email or web address] Date: 04/28/2022 Prepared by: [facility]  Exercises - Seated Shoulder Circles AAROM with Dowel  into Wall  - 1 x daily - 2 sets - 10 reps - Seated Shoulder Flexion Extension AAROM with Dowel into Wall  - 1 x daily - 2 sets - 10 reps - Seated Chest Press with Bar  - 1 x daily - 2 sets - 10 reps - Seated Shoulder External Rotation AAROM with Cane and Hand in Neutral  - 1 x daily - 2 sets - 10 reps - Seated Forearm Supination PROM with Caregiver  - 1 x daily - 2 sets - 10 reps - Seated Elbow Extension and Shoulder External Rotation AAROM at Table with Towel  - 1 x daily - 2 sets - 10 reps - Seated Shoulder Flexion Towel Slide at Table Top  - 1 x daily - 2 sets - 10 reps    GOALS: Goals reviewed with patient? Yes  SHORT TERM GOALS: Target date: 04/24/22  Pt and caregiver will be independent with HEP for shoulder and elbow ROM. Baseline: Pt not currently engaging in any ROM/stretching program Goal status: MET - 04/21/22  2.  Pt will demonstrate improved active  elbow extension to -30* to increase functional use Baseline: -34* Goal status: NOT MET - 04/21/22  3.  Pt will demonstrate improved shoulder flexion to 110* to increase functional use Baseline: 106* Goal status: NOT MET - 04/21/22   LONG TERM GOALS: Target date: 06/19/22  Pt will tolerate advanced HEP/stretching program for RUE. Baseline: No HEP at this time Goal status: IN PROGRESS  2.  Pt will demonstrate improved active elbow extension to -20* to increase functional use Baseline: -34* Goal status: IN PROGRESS  3.  Pt will demonstrate improved shoulder flexion to 115* to allow for improved ease with ADLs. Baseline: 106* Goal status: IN PROGRESS  4.  Pt will be independent with splint wear and care PRN Baseline: splinting needs TBD Goal status: IN PROGRESS  ASSESSMENT:  CLINICAL IMPRESSION: Treatment session with focus on progression of HEP with focus on shoulder flexion and elbow flexion/extension. Pt benefiting from hand over hand to maintain grasp and proper placement of RUE on dowel, ball, and towel due to tone  in wrist and fingers limiting sustained grasp. Increased shoulder flexion and sustained stretch with wall slides. Pt tolerating ROM and stretch with no reports of pain.  PERFORMANCE DEFICITS in functional skills including ADLs, IADLs, coordination, dexterity, tone, ROM, strength, pain, flexibility, FMC, GMC, decreased knowledge of precautions, and UE functional use, cognitive skills including learn, memory, and safety awareness.  IMPAIRMENTS are limiting patient from ADLs and IADLs.   COMORBIDITIES may have co-morbidities  that affects occupational performance. Patient will benefit from skilled OT to address above impairments and improve overall function.  MODIFICATION OR ASSISTANCE TO COMPLETE EVALUATION: Min-Moderate modification of tasks or assist with assess necessary to complete an evaluation.  OT OCCUPATIONAL PROFILE AND HISTORY: Detailed assessment: Review of records and additional review of physical, cognitive, psychosocial history related to current functional performance.  CLINICAL DECISION MAKING: Moderate - several treatment options, min-mod task modification necessary  REHAB POTENTIAL: Fair ongoing spasticity  EVALUATION COMPLEXITY: Moderate    PLAN: OT FREQUENCY: 1x/week  OT DURATION: 12 weeks (Plan for 8 visits over 12 weeks, but asking 12 weeks to allow for time to benefit upcoming botox injections)  PLANNED INTERVENTIONS: self care/ADL training, therapeutic exercise, therapeutic activity, neuromuscular re-education, manual therapy, passive range of motion, aquatic therapy, splinting, moist heat, cryotherapy, and patient/family education  RECOMMENDED OTHER SERVICES: may benefit from aquatic therapy OT  CONSULTED AND AGREED WITH PLAN OF CARE: Patient and family member/caregiver  PLAN FOR NEXT SESSION: review HEP and continue PROM/AAROM and self-ROM for shoulder, elbow, wrist ROM.   Simonne Come, OTR/L 05/05/2022, 8:13 AM

## 2022-05-12 ENCOUNTER — Ambulatory Visit: Payer: BC Managed Care – PPO | Admitting: Occupational Therapy

## 2022-05-12 ENCOUNTER — Encounter: Payer: Self-pay | Admitting: Physical Therapy

## 2022-05-12 ENCOUNTER — Ambulatory Visit: Payer: BC Managed Care – PPO | Admitting: Physical Therapy

## 2022-05-12 DIAGNOSIS — R2681 Unsteadiness on feet: Secondary | ICD-10-CM

## 2022-05-12 DIAGNOSIS — M6281 Muscle weakness (generalized): Secondary | ICD-10-CM

## 2022-05-12 DIAGNOSIS — G8111 Spastic hemiplegia affecting right dominant side: Secondary | ICD-10-CM | POA: Diagnosis not present

## 2022-05-12 NOTE — Therapy (Signed)
OUTPATIENT PHYSICAL THERAPY NEURO TREATMENT NOTE   Patient Name: Paul Kane MRN: [id number] DOB:September 06, 1985, 36 y.o., male Today's Date: 05/12/2022   PCP: Caswell Corwin REFERRING PROVIDER: Meredith Staggers, MD    PT End of Session - 05/12/22 1158     Visit Number 8    Number of Visits 12    Date for PT Re-Evaluation 06/18/22    Authorization Type BCBS/ Medicaid of [place] for secondary    Authorization Time Period 11 visits from 10/10-12/25/23    Authorization - Visit Number 2    Authorization - Number of Visits 11    PT Start Time 0849    PT Stop Time 0930    PT Time Calculation (min) 41 min    Equipment Utilized During Treatment Gait belt    Activity Tolerance Patient tolerated treatment well    Behavior During Therapy [facility] for tasks assessed/performed;Impulsive   frequent redirection to task              Past Medical History:  Diagnosis Date   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[place])    Chronic hepatitis B ([place])    Fixed pupils    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[place])    Hypertension    Intracranial injury of other and unspecified nature, without mention of open intracranial wound, unspecified state of consciousness    Intracranial shunt    Paralysis ([place])    Spastic hemiplegia affecting dominant side ([place])    Stroke ([place])    Traumatic brain injury ([place])    Trouble swallowing    Visual disturbance    Weakness    Past Surgical History:  Procedure Laterality Date   cramiectomy     CRANIOPLASTY     REMOVAL OF GASTROSTOMY TUBE     VENTRICULOPERITONEAL SHUNT     Patient Active Problem List   Diagnosis Date Noted   SIRS due to infectious process with acute organ dysfunction ([place]) 03/14/2021   SIRS (systemic inflammatory response syndrome) ([place]) 03/12/2021   Syncope 03/12/2021   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[place]) 03/12/2021   Hypokalemia 03/12/2021   Lactic acidosis 03/12/2021   Mixed hyperlipidemia 04/25/2018   Seizure disorder  ([place]) 04/11/2018   Mechanical low back pain 05/12/2017   Dysarthria 12/20/2015   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/20/2012   Deafness, sensorineural 04/20/2012   Buzzing in ear 04/20/2012   Leaking percutaneous endoscopic gastrostomy (PEG) tube ([place]) 02/04/2012   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 pre-existing conscious levels, sequela ([place]) 10/12/2011   Cerebral infarct ([place]) 10/12/2011   Spastic hemiplegia of right dominant side due to noncerebrovascular etiology ([place]) 10/12/2011   Dysphagia 10/12/2011   Functioning G-tube 03/24/2011   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   Hemiplegia of dominant side ([place]) 07/28/2010   CEREBRAL HEMORRHAGE 07/28/2010   PERSONAL HISTORY OF TRAUMATIC BRAIN INJURY 07/02/2010   ALLERGIC RHINITIS CAUSE UNSPECIFIED 10/25/2008   GASTROENTERITIS 08/09/2008   CONTACT DERMATITIS&OTHER ECZEMA DUE DETERGENTS 08/18/2007   HEPATITIS B, CHRONIC 06/28/2007   Chronic viral hepatitis B without delta-agent ([place]) 06/28/2007    ONSET DATE: 2012  REFERRING DIAG: G81.11 (ICD-10-CM) - Spastic hemiplegia of right dominant side due to noncerebrovascular etiology   THERAPY DIAG:  Muscle weakness (generalized)  Unsteadiness on feet  Rationale for Evaluation and Treatment Rehabilitation  SUBJECTIVE:  SUBJECTIVE STATEMENT: [place] Se[place] last night in bedroom.  Hit the R elbow.  Hurts and slightly red.  Father thinks it's similar to the fluid sac issue he had previously.  To see the orthotist on Nov. 1.  Pt accompanied by: family member father  PERTINENT HISTORY: . History of traumatic brain injury with hydrocephalus, meningitis,   and right pontine stroke.   2. Spastic tetraplegia, dominant side more affected 3. severe oropharyngeal  dysphagia--improved--tolerating diet well.  Continues on thickened liquids 4. Absence seizures 5. Mechanical Low back pain: due to posture/altered gait patterns--improved 6. Left great toe infection--podiatry f/u  PAIN:  Are you having pain? Yes: NPRS scale: 7/10 Pain location: R elbow Pain description: sharp Aggravating factors: fall last night Relieving factors: ice    PRECAUTIONS: Fall  WEIGHT BEARING RESTRICTIONS No  FALLS: Has patient fallen in last 6 months? Yes. Number of falls 2  LIVING ENVIRONMENT: Lives with: lives with their family Lives in: House/apartment Stairs: Yes: Internal: 12 steps; can reach both and External: 3 steps; can reach both Has following equipment at home: Grab bars and None  PLOF: Needs assistance with ADLs and Needs assistance with homemaking supervision for outdoor ambulation  PATIENT GOALS  Have a maintenance program  OBJECTIVE:     TODAY'S TREATMENT: 04/2422 Activity Comments  Sidestepping in parallel bars, 5 reps, then side step together R and L with cues for slow motion to increase stance time RLE 5# RLE  Bosu taps 2x10 5# RLE for hip flex/limb clearance  Bosu lunges RLE, therapist facilitating knee flexion drive to extension for hamstring stretch   Seated LAQ 10  5# RLE  Seated eversion, 10 reps   Standing lateral weightshift, then trunk rotation Therapist facilitating weight shifting and righting reactions  Seated hip abduction, stepping out and in over obstacle, 5# RLE, 2 x 10 reps Therapist assist to clear obstacle  Seated marching 2 x 10, 5# RLE        PATIENT EDUCATION: Education details: Monitor R elbow for increased pain, increased warmth and redness and contact MD if needed Person educated: Patient and Parent Education method: Explanation Education comprehension: verbalized understanding      ------------------------------------------------------------------------   DIAGNOSTIC FINDINGS:   COGNITION: Overall  cognitive status: History of cognitive impairments - at baseline   SENSATION: Not tested  COORDINATION: RLE impaired  EDEMA:  none  MUSCLE TONE: RLE: Hypertonic, no ankle clonus detected   MUSCLE LENGTH: No obvious restrictions  DTRs:    POSTURE: weight shift left  LOWER EXTREMITY ROM:     Passive  Right Eval Left Eval  Hip flexion    Hip extension    Hip abduction    Hip adduction    Hip internal rotation    Hip external rotation    Knee flexion    Knee extension    Ankle dorsiflexion -9 WNL  Ankle plantarflexion    Ankle inversion    Ankle eversion     (Blank rows = not tested)  ---father reports that they have a static, progressive (JAS) brace that they use x 30 min daily on right ankle for dorsiflexion ROM  LOWER EXTREMITY MMT:    MMT Right Eval Left Eval  Hip flexion 5 4  Hip extension    Hip abduction 5 4  Hip adduction 5 4  Hip internal rotation    Hip external rotation    Knee flexion 5 4  Knee extension 5 4  Ankle dorsiflexion 5 4  Ankle plantarflexion  Ankle inversion 5 4  Ankle eversion 5 3  (Blank rows = not tested)  BED MOBILITY:  Independent  TRANSFERS: Assistive device utilized: None  Sit to stand: Complete Independence and Modified independence Stand to sit: Complete Independence Chair to chair: Complete Independence and Modified independence Floor:  DNT   CURB:  Level of Assistance: SBA and CGA Assistive device utilized: None Curb Comments: CGA  STAIRS:  Level of Assistance: SBA  Stair Negotiation Technique: Step to Pattern with Bilateral Rails  Number of Stairs: 6   Height of Stairs: 4-6"  Comments: right knee wobble w/ descending  GAIT: Gait pattern: trunk rotated posterior- Right, knee extensor thrust Distance walked:  Assistive device utilized: None, right ankle hinged [place] Level of assistance: Modified independence and SBA Comments: notes right ankle inversion when walking barefoot  FUNCTIONAL  TESTs:  5 times sit to stand: 19.10 sec--bias to LLE Timed up and go (TUG): DNT Berg Balance Scale: 32/56 10 meter walk test: 12.39 sec = 2.6 ft/sec   PATIENT EDUCATION: Education details: regarding assessment findings and rationale of PT intervention Person educated: Patient and Parent Education method: Explanation Education comprehension: verbalized understanding   HOME EXERCISE PROGRAM: Access Code: [email or web address] URL: [email or web address] Date: 03/26/2022 Prepared by: Sherlyn Lees  Exercises - Seated Ankle Eversion with Resistance  - 1 x daily - 7 x weekly - 3 sets - 10 reps -Recumbent stationary bicycling    GOALS: Goals reviewed with patient? Yes  SHORT TERM GOALS: Target date: 05/07/2022  Patient will perform HEP with family/caregiver supervision for improved strength, balance, transfers, and gait  Baseline: in development to incorporate land and aquatic-based interventions Goal status: On-going,    LONG TERM GOALS: Target date: 06/18/2022  Reduce risk for falls per score 45/56 Berg Balance Test Baseline: 32/56; (04/16/22) 40/56 Goal status: Goal Met, UPGRADE  2.  Increase right eversion strength to 4/5 to improve ankle posture and decrease inversion when walking barefoot to reduce risk for fall/injury Baseline: 3/5; (04/16/22) 3+/5  Goal status: on-going  3.  Improve BLE strength and balance per time of 12 sec 5xSTS to reduce risk for falls Baseline: 19+ sec with LLE bias/weight shift; (04/16/22) 13.78 sec Goal status: Goal met, UPGRADE    ASSESSMENT:  CLINICAL IMPRESSION: Pt arrives today with reports of fall last night in bedroom.  Pt reports pain in R elbow from fall, with redness and slight warmth noted at elbow.  No other pain noted.  Educated pt/father on monitoring and when to proceed for further assessment.  Pt feels he is good to continue PT session today, and mostly focused on lower extremity strengthening tasks.  Pt continues to move  quickly onto/off of RLE and needs tactile, verbal cueing to slow pace to allow for more weightbearing through RLE.  He will benefit from continued sessions to advance motor control/coordination and strength to reduce risk for falls and train in gym program for caregiver and pt.  OBJECTIVE IMPAIRMENTS Abnormal gait, decreased balance, decreased coordination, difficulty walking, decreased strength, improper body mechanics, and postural dysfunction.   ACTIVITY LIMITATIONS carrying, lifting, bending, squatting, stairs, and locomotion level  PARTICIPATION LIMITATIONS: cleaning, interpersonal relationship, community activity, and yard work  PERSONAL FACTORS Time since onset of injury/illness/exacerbation and 1 comorbidity: hx of TBI  are also affecting patient's functional outcome.   REHAB POTENTIAL: Good  CLINICAL DECISION MAKING: Evolving/moderate complexity  EVALUATION COMPLEXITY: Moderate  PLAN: PT FREQUENCY: 1x/week  PT DURATION: 12 weeks  PLANNED INTERVENTIONS: Therapeutic exercises, Therapeutic activity, Neuromuscular re-education,  Balance training, Gait training, Patient/Family education, Self Care, Joint mobilization, Stair training, Vestibular training, Canalith repositioning, Orthotic/Fit training, DME instructions, Aquatic Therapy, Electrical stimulation, Wheelchair mobility training, Spinal mobilization, Cryotherapy, Moist heat, Taping, Ionotophoresis 104m/ml Dexamethasone, and Manual therapy  PLAN FOR NEXT SESSION: Update HEP (code that is in pt's chart on previous visit has vestibular exercises, so unsure which exercises pt has at home) for progression of gym routine   AMady Haagensen PT 05/12/22 11:59 AM Phone: [phone]: [phone] [address] [place]rey ForestGFortville [place] [address]Phone # [phone] # [phone]

## 2022-05-18 NOTE — Therapy (Signed)
OUTPATIENT PHYSICAL THERAPY NEURO TREATMENT NOTE   Patient Name: Paul Kane MRN: [id number] DOB:[birth year], 36 y.o., male Today's Date: 05/19/2022   PCP: Caswell Corwin REFERRING PROVIDER: Meredith Staggers, MD    PT End of Session - 05/19/22 [phone]     Visit Number 9    Number of Visits 12    Date for PT Re-Evaluation 06/18/22    Authorization Type BCBS/ Medicaid of [place] for secondary    Authorization Time Period 11 visits from 10/10-12/25/23    Authorization - Visit Number 2    Authorization - Number of Visits 11    PT Start Time 0919   Pt late for appointment   PT Stop Time 0945    PT Time Calculation (min) 26 min    Equipment Utilized During Treatment Gait belt    Activity Tolerance Patient tolerated treatment well    Behavior During Therapy [facility] for tasks assessed/performed;Impulsive   frequent redirection to task               Past Medical History:  Diagnosis Date   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[place])    Chronic hepatitis B ([place])    Fixed pupils    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[place])    Hypertension    Intracranial injury of other and unspecified nature, without mention of open intracranial wound, unspecified state of consciousness    Intracranial shunt    Paralysis ([place])    Spastic hemiplegia affecting dominant side ([place])    Stroke ([place])    Traumatic brain injury ([place])    Trouble swallowing    Visual disturbance    Weakness    Past Surgical History:  Procedure Laterality Date   cramiectomy     CRANIOPLASTY     REMOVAL OF GASTROSTOMY TUBE     VENTRICULOPERITONEAL SHUNT     Patient Active Problem List   Diagnosis Date Noted   SIRS due to infectious process with acute organ dysfunction ([place]) 03/14/2021   SIRS (systemic inflammatory response syndrome) ([place]) 03/12/2021   Syncope 03/12/2021   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[place]) 03/12/2021   Hypokalemia 03/12/2021   Lactic acidosis 03/12/2021   Mixed hyperlipidemia  04/25/2018   Seizure disorder ([place]) 04/11/2018   Mechanical low back pain 05/12/2017   Dysarthria 12/20/2015   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/20/2012   Deafness, sensorineural 04/20/2012   Buzzing in ear 04/20/2012   Leaking percutaneous endoscopic gastrostomy (PEG) tube ([place]) 02/04/2012   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 pre-existing conscious levels, sequela ([place]) 10/12/2011   Cerebral infarct ([place]) 10/12/2011   Spastic hemiplegia of right dominant side due to noncerebrovascular etiology ([place]) 10/12/2011   Dysphagia 10/12/2011   Functioning G-tube 03/24/2011   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   Hemiplegia of dominant side ([place]) 07/28/2010   CEREBRAL HEMORRHAGE 07/28/2010   PERSONAL HISTORY OF TRAUMATIC BRAIN INJURY 07/02/2010   ALLERGIC RHINITIS CAUSE UNSPECIFIED 10/25/2008   GASTROENTERITIS 08/09/2008   CONTACT DERMATITIS&OTHER ECZEMA DUE DETERGENTS 08/18/2007   HEPATITIS B, CHRONIC 06/28/2007   Chronic viral hepatitis B without delta-agent ([place]) 06/28/2007    ONSET DATE: 2012  REFERRING DIAG: G81.11 (ICD-10-CM) - Spastic hemiplegia of right dominant side due to noncerebrovascular etiology   THERAPY DIAG:  Muscle weakness (generalized)  Unsteadiness on feet  Spastic hemiplegia affecting right dominant side, unspecified etiology ([place])  Rationale for Evaluation and Treatment Rehabilitation  SUBJECTIVE:  SUBJECTIVE STATEMENT: [place] last night in bedroom.  Hit the R elbow.  Hurts and slightly red.  Father thinks it's similar to the fluid sac issue he had previously.  To see the orthotist on Nov. 1.  Pt accompanied by: family member father  PERTINENT HISTORY: . History of traumatic brain injury with hydrocephalus, meningitis,    and right pontine stroke.   2. Spastic tetraplegia, dominant side more affected 3. severe oropharyngeal dysphagia--improved--tolerating diet well.  Continues on thickened liquids 4. Absence seizures 5. Mechanical Low back pain: due to posture/altered gait patterns--improved 6. Left great toe infection--podiatry f/u  PAIN:  Are you having pain? Yes: NPRS scale: 7/10 Pain location: R elbow Pain description: sharp Aggravating factors: fall last night Relieving factors: ice    PRECAUTIONS: Fall  WEIGHT BEARING RESTRICTIONS No  FALLS: Has patient fallen in last 6 months? Yes. Number of falls 2  LIVING ENVIRONMENT: Lives with: lives with their family Lives in: House/apartment Stairs: Yes: Internal: 12 steps; can reach both and External: 3 steps; can reach both Has following equipment at home: Grab bars and None  PLOF: Needs assistance with ADLs and Needs assistance with homemaking supervision for outdoor ambulation  PATIENT GOALS  Have a maintenance program  OBJECTIVE:   TODAY'S TREATMENT: 05/19/22     Pt seen for aquatic therapy today.  Treatment took place in water 3.25-4.5 ft in depth at the [facility]. Temp of water was 92.  Pt entered/exited the pool via stairs with cga and hand rail using step to pattern.   *Walking forward back and sideways 4.6 ft unsupported multiple widths *Holding to wall: df; pf; add/abd; squats, hip flex; hip ext. *Seated LAQ; march. *STS from bench onto water step x 10; Completes indep ~3/10    Pt requires moderate verbal cuing, demonstration and cga throughout for safety with therapist instructing from deck, [place] therapist sitting poolside   Pt requires the buoyancy and hydrostatic pressure of water for support, and to offload joints by unweighting joint load by at least 50 % in navel deep water and by at least 75-80% in chest to neck deep water.  Viscosity of the water is needed for resistance of strengthening. Water current  perturbations provides challenge to standing balance requiring increased core activation.  TODAY'S TREATMENT: 04/2422 Activity Comments  Sidestepping in parallel bars, 5 reps, then side step together R and L with cues for slow motion to increase stance time RLE 5# RLE  Bosu taps 2x10 5# RLE for hip flex/limb clearance  Bosu lunges RLE, therapist facilitating knee flexion drive to extension for hamstring stretch   Seated LAQ 10  5# RLE  Seated eversion, 10 reps   Standing lateral weightshift, then trunk rotation Therapist facilitating weight shifting and righting reactions  Seated hip abduction, stepping out and in over obstacle, 5# RLE, 2 x 10 reps Therapist assist to clear obstacle  Seated marching 2 x 10, 5# RLE        PATIENT EDUCATION: Education details: Monitor R elbow for increased pain, increased warmth and redness and contact MD if needed Person educated: Patient and Parent Education method: Explanation Education comprehension: verbalized understanding      ------------------------------------------------------------------------   DIAGNOSTIC FINDINGS:   COGNITION: Overall cognitive status: History of cognitive impairments - at baseline   SENSATION: Not tested  COORDINATION: RLE impaired  EDEMA:  none  MUSCLE TONE: RLE: Hypertonic, no ankle clonus detected   MUSCLE LENGTH: No obvious restrictions  DTRs:    POSTURE: weight  shift left  LOWER EXTREMITY ROM:     Passive  Right Eval Left Eval  Hip flexion    Hip extension    Hip abduction    Hip adduction    Hip internal rotation    Hip external rotation    Knee flexion    Knee extension    Ankle dorsiflexion -9 WNL  Ankle plantarflexion    Ankle inversion    Ankle eversion     (Blank rows = not tested)  ---father reports that they have a static, progressive (JAS) brace that they use x 30 min daily on right ankle for dorsiflexion ROM  LOWER EXTREMITY MMT:    MMT Right Eval Left Eval  Hip  flexion 5 4  Hip extension    Hip abduction 5 4  Hip adduction 5 4  Hip internal rotation    Hip external rotation    Knee flexion 5 4  Knee extension 5 4  Ankle dorsiflexion 5 4  Ankle plantarflexion    Ankle inversion 5 4  Ankle eversion 5 3  (Blank rows = not tested)  BED MOBILITY:  Independent  TRANSFERS: Assistive device utilized: None  Sit to stand: Complete Independence and Modified independence Stand to sit: Complete Independence Chair to chair: Complete Independence and Modified independence Floor:  DNT   CURB:  Level of Assistance: SBA and CGA Assistive device utilized: None Curb Comments: CGA  STAIRS:  Level of Assistance: SBA  Stair Negotiation Technique: Step to Pattern with Bilateral Rails  Number of Stairs: 6   Height of Stairs: 4-6"  Comments: right knee wobble w/ descending  GAIT: Gait pattern: trunk rotated posterior- Right, knee extensor thrust Distance walked:  Assistive device utilized: None, right ankle hinged [place] Level of assistance: Modified independence and SBA Comments: notes right ankle inversion when walking barefoot  FUNCTIONAL TESTs:  5 times sit to stand: 19.10 sec--bias to LLE Timed up and go (TUG): DNT Berg Balance Scale: 32/56 10 meter walk test: 12.39 sec = 2.6 ft/sec   PATIENT EDUCATION: Education details: regarding assessment findings and rationale of PT intervention Person educated: Patient and Parent Education method: Explanation Education comprehension: verbalized understanding   HOME EXERCISE PROGRAM: Access Code: [email or web address] URL: [email or web address] Date: 03/26/2022 Prepared by: Sherlyn Lees  Exercises - Seated Ankle Eversion with Resistance  - 1 x daily - 7 x weekly - 3 sets - 10 reps -Recumbent stationary bicycling    GOALS: Goals reviewed with patient? Yes  SHORT TERM GOALS: Target date: 05/07/2022  Patient will perform HEP with family/caregiver supervision for improved strength,  balance, transfers, and gait  Baseline: in development to incorporate land and aquatic-based interventions Goal status: On-going,    LONG TERM GOALS: Target date: 06/18/2022  Reduce risk for falls per score 45/56 Berg Balance Test Baseline: 32/56; (04/16/22) 40/56 Goal status: Goal Met, UPGRADE  2.  Increase right eversion strength to 4/5 to improve ankle posture and decrease inversion when walking barefoot to reduce risk for fall/injury Baseline: 3/5; (04/16/22) 3+/5  Goal status: on-going  3.  Improve BLE strength and balance per time of 12 sec 5xSTS to reduce risk for falls Baseline: 19+ sec with LLE bias/weight shift; (04/16/22) 13.78 sec Goal status: Goal met, UPGRADE    ASSESSMENT:  CLINICAL IMPRESSION: Very limited time today due to pt being late then needing to use bathroom. He has improving balance static and dynamic submerged not requiring therapist assistance in pool.  He does struggle with STS accelerating then decelerating to  gain immediate standing balance.  No complaints of pain or discomfort. He continues to benefit from aquatic therapy with slow progression towards goals.   OBJECTIVE IMPAIRMENTS Abnormal gait, decreased balance, decreased coordination, difficulty walking, decreased strength, improper body mechanics, and postural dysfunction.   ACTIVITY LIMITATIONS carrying, lifting, bending, squatting, stairs, and locomotion level  PARTICIPATION LIMITATIONS: cleaning, interpersonal relationship, community activity, and yard work  PERSONAL FACTORS Time since onset of injury/illness/exacerbation and 1 comorbidity: hx of TBI  are also affecting patient's functional outcome.   REHAB POTENTIAL: Good  CLINICAL DECISION MAKING: Evolving/moderate complexity  EVALUATION COMPLEXITY: Moderate  PLAN: PT FREQUENCY: 1x/week  PT DURATION: 12 weeks  PLANNED INTERVENTIONS: Therapeutic exercises, Therapeutic activity, Neuromuscular re-education, Balance training, Gait  training, Patient/Family education, Self Care, Joint mobilization, Stair training, Vestibular training, Canalith repositioning, Orthotic/Fit training, DME instructions, Aquatic Therapy, Electrical stimulation, Wheelchair mobility training, Spinal mobilization, Cryotherapy, Moist heat, Taping, Ionotophoresis 47m/ml Dexamethasone, and Manual therapy  PLAN FOR NEXT SESSION: Update HEP (code that is in pt's chart on previous visit has vestibular exercises, so unsure which exercises pt has at home) for progression of gym routine   MStanton Kidney(Tharon Aquas Florinda Taflinger MPT /31/23  947AM

## 2022-05-19 ENCOUNTER — Ambulatory Visit (HOSPITAL_BASED_OUTPATIENT_CLINIC_OR_DEPARTMENT_OTHER): Payer: BC Managed Care – PPO | Admitting: Physical Therapy

## 2022-05-19 ENCOUNTER — Ambulatory Visit: Payer: BC Managed Care – PPO | Admitting: Occupational Therapy

## 2022-05-19 ENCOUNTER — Encounter (HOSPITAL_BASED_OUTPATIENT_CLINIC_OR_DEPARTMENT_OTHER): Payer: Self-pay | Admitting: Physical Therapy

## 2022-05-19 DIAGNOSIS — R2681 Unsteadiness on feet: Secondary | ICD-10-CM

## 2022-05-19 DIAGNOSIS — G8111 Spastic hemiplegia affecting right dominant side: Secondary | ICD-10-CM | POA: Diagnosis not present

## 2022-05-19 DIAGNOSIS — M6281 Muscle weakness (generalized): Secondary | ICD-10-CM

## 2022-05-19 DIAGNOSIS — M25611 Stiffness of right shoulder, not elsewhere classified: Secondary | ICD-10-CM

## 2022-05-19 DIAGNOSIS — R262 Difficulty in walking, not elsewhere classified: Secondary | ICD-10-CM

## 2022-05-19 DIAGNOSIS — M25621 Stiffness of right elbow, not elsewhere classified: Secondary | ICD-10-CM

## 2022-05-19 DIAGNOSIS — M25641 Stiffness of right hand, not elsewhere classified: Secondary | ICD-10-CM

## 2022-05-19 NOTE — Therapy (Signed)
OUTPATIENT OCCUPATIONAL THERAPY  Treatment Note  Patient Name: Paul Kane MRN: [id number] DOB:02/18/1986, 36 y.o., male Today's Date: 05/19/2022  PCP: Jonathon Bellows, [place]- [facility] REFERRING PROVIDER: Meredith Staggers, MD   OT End of Session - 05/19/22 1020     Visit Number 7    Number of Visits 9    Date for OT Re-Evaluation 06/19/22    Authorization Type [employer]BS primary/ [employer] secondary    Authorization Time Period Auth#: [id number] , approved for 8 OT visits from 04/02/2022-06/24/2022    Authorization - Visit Number 6    Authorization - Number of Visits 8    OT Start Time 1020    OT Stop Time 1100    OT Time Calculation (min) 40 min                   Past Medical History:  Diagnosis Date   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[place])    Chronic hepatitis B ([place])    Fixed pupils    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[place])    Hypertension    Intracranial injury of other and unspecified nature, without mention of open intracranial wound, unspecified state of consciousness    Intracranial shunt    Paralysis ([place])    Spastic hemiplegia affecting dominant side ([place])    Stroke ([place])    Traumatic brain injury ([place])    Trouble swallowing    Visual disturbance    Weakness    Past Surgical History:  Procedure Laterality Date   cramiectomy     CRANIOPLASTY     REMOVAL OF GASTROSTOMY TUBE     VENTRICULOPERITONEAL SHUNT     Patient Active Problem List   Diagnosis Date Noted   SIRS due to infectious process with acute organ dysfunction ([place]) 03/14/2021   SIRS (systemic inflammatory response syndrome) ([place]) 03/12/2021   Syncope 03/12/2021   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[place]) 03/12/2021   Hypokalemia 03/12/2021   Lactic acidosis 03/12/2021   Mixed hyperlipidemia 04/25/2018   Seizure disorder ([place]) 04/11/2018   Mechanical low back pain 05/12/2017   Dysarthria 12/20/2015   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/20/2012   Deafness, sensorineural  04/20/2012   Buzzing in ear 04/20/2012   Leaking percutaneous endoscopic gastrostomy (PEG) tube ([place]) 02/04/2012   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 pre-existing conscious levels, sequela ([place]) 10/12/2011   Cerebral infarct ([place]) 10/12/2011   Spastic hemiplegia of right dominant side due to noncerebrovascular etiology ([place]) 10/12/2011   Dysphagia 10/12/2011   Functioning G-tube 03/24/2011   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   Hemiplegia of dominant side ([place]) 07/28/2010   CEREBRAL HEMORRHAGE 07/28/2010   PERSONAL HISTORY OF TRAUMATIC BRAIN INJURY 07/02/2010   ALLERGIC RHINITIS CAUSE UNSPECIFIED 10/25/2008   GASTROENTERITIS 08/09/2008   CONTACT DERMATITIS&OTHER ECZEMA DUE DETERGENTS 08/18/2007   HEPATITIS B, CHRONIC 06/28/2007   Chronic viral hepatitis B without delta-agent ([place]) 06/28/2007    ONSET DATE: 03/04/22 (referral date)  REFERRING DIAG: G81.11 (ICD-10-CM) - Spastic hemiplegia of right dominant side due to noncerebrovascular etiology   THERAPY DIAG:  Spastic hemiplegia affecting right dominant side, unspecified etiology ([place])  Stiffness of right shoulder, not elsewhere classified  Stiffness of right elbow, not elsewhere classified  Stiffness of right hand, not elsewhere classified  Difficulty in walking, not elsewhere classified  Muscle weakness (generalized)  Rationale for Evaluation and Treatment Rehabilitation  SUBJECTIVE:   SUBJECTIVE STATEMENT: Pt's father reports that he fell about 2  weeks ago, reinjuring his R elbow.  Pt accompanied by: self and family member (dad)  PERTINENT HISTORY: 1. History of traumatic brain injury with hydrocephalus, meningitis,   and right pontine stroke (2011) 2. Spastic tetraplegia, dominant side more affected 3. severe oropharyngeal dysphagia--improved--tolerating diet well.  Continues on thickened liquids 4. Absence seizures 5. Mechanical Low back pain: due to  posture/altered gait patterns--improved 6. Left great toe infection--podiatry f/u    PRECAUTIONS: Fall and Other: h/o seizures  WEIGHT BEARING RESTRICTIONS No  PAIN:  Are you having pain? No  FALLS: Has patient fallen in last 6 months? Yes. Number of falls 4-5  LIVING ENVIRONMENT: Lives with: lives with their family Lives in: House/apartment Stairs: Yes: Internal: full flight to 2nd floor, but bedroom and bathroom on main floor and does not need to go up steps; can reach both and External: 3 steps; can reach both Has following equipment at home: shower chair and Grab bars  PLOF: Needs assistance with ADLs  PATIENT GOALS "arm" stronger and flexible  OBJECTIVE:   HAND DOMINANCE: Right  ADLs: Mobility: Mod I around the house, hand held assist when in the community Grooming: Mod I  UB Dressing: Mod I LB Dressing: family assists with donning AFO and tying shoes Toileting: Mod I - utilizes bidet for hygiene Bathing: family assists with bathing, he completes ~90% Tub Shower transfers: Supervision/assist with shower transfers Equipment: Shower seat with back, Grab bars, and Walk in shower  UPPER EXTREMITY ROM     Active ROM Right eval Left eval Right 04/21/22  Shoulder flexion 106  104  Shoulder abduction     Shoulder adduction     Shoulder extension     Shoulder internal rotation     Shoulder external rotation     Elbow flexion 123  132  Elbow extension -34  -38  Wrist flexion 12    Wrist extension -12    Wrist ulnar deviation     Wrist radial deviation     Wrist pronation     Wrist supination     (Blank rows = not tested)  Passive ROM Right eval Left eval Right 04/21/22  Shoulder flexion 110  115  Shoulder abduction     Shoulder adduction     Shoulder extension     Shoulder internal rotation     Shoulder external rotation     Elbow flexion 143  145  Elbow extension -32  -34  Wrist flexion 40    Wrist extension 34    Wrist ulnar deviation     Wrist  radial deviation     Wrist pronation     Wrist supination     (Blank rows = not tested)  HAND FUNCTION: Pt is able to manually open hand with great effort  MUSCLE TONE: RUE: Moderate, Hypertonic, and Modifed Ashworth Scale 3 = Considerable increase in muscle tone, passive movement difficult  COGNITION: Overall cognitive status: Impaired, History of cognitive impairments - at baseline, and father present providing majority of information --------------------------------------------------------------------------------------------------------------------------------------------------------  TODAY'S TREATMENT:  05/19/22 NMR/stretch: WB through [place] on half ball to accommodate for decreased hand opening and wrist extension. OT facilitating increased shoulder and elbow ROM with reaching outside BOS with LUE while maintaining WB through RUE.  OT with hand placement over R hand to obtain and maintain positioning and providing tactile cues for increased stretch. Therapeutic exercises: OT reiterated HEP with pt demonstrating each exercise. Pt requiring mod demonstration cues for proper technique with shoulder circles with dowel  with focus on completing both in clockwise and counter clockwise direction as well as when completing shoulder external rotation with cane and hand in neutral to allow for increased stretch. Palpated elbow and surrounding area s/p fall 2 weeks ago, elbow boggy but no c/o pain to palpation or with movements.    05/05/22 AAROM: Closed- chain AAROM with hand over hand while rolling large therapy ball forward and then horizontally with focus on shoulder flexion, elbow extension, and horizontal abduction/adduction. AAROM: Closed -chain AAROM while holding hula hoop with focus on chest press, internal/external rotation, and shoulder flexion.  OT providing hand over hand to maintain grasp on hula hoop, progressing to taping with coban to maintain grasp during exercise.  Pt continues to  demonstrate decreased elbow extension and shoulder flexion, especially when attempting to reach overhead. Wall slides: hand over hand wall slides with focus on shoulder flexion and diagonal reaching. Pt able to achieve increased shoulder flexion with wall slides and sustained reach for 3-5 seconds in flexion.    04/28/22 NMR/elbow extension: Engaged in rolling ball on mat table with RUE with focus on elbow extension and shoulder abduction.  OT providing tactile cues at torso to minimize lean and facilitate increased stretch. OT providing hand over hand assist to attempt to facilitate increased opening of hand.   PROM: OT providing PROM to R wrist with focus on flexion/extension. Unable to elicit any ulnar or radial deviation with PROM due to tone.  Pt tolerating wrist flexion/extension with no c/o pain. AAROM: Closed-chain AAROM with SPC for elbow extension and shoulder flexion with forward flexion as well as circles in clockwise and counter-clockwise pattern.  OT providing initial hand over hand assist to facilitate technique, pt then able to continue with min compensatory strategies at trunk.  Transitioned to increased elbow extension/flexion with chest press and diagonal reaching.  OT providing cues to visually attend to RUE to increase extension during movements. Towel slides: OT providing hand over hand and mod facilitation for elbow flexion/extension and shoulder flexion with towel slides on table.  Pt demonstrating difficulty with flexion/extension due to increased tone.       PATIENT EDUCATION: Education details: Reiterated HEP for shoulder, elbow, and forearm stretches Person educated: Patient and Parent Education method: Explanation, Demonstration, Corporate treasurer cues, and Handouts Education comprehension: needs further education   HOME EXERCISE PROGRAM: Access Code: [email or web address] URL: [email or web address] Date: 04/28/2022 Prepared by: [facility]  Exercises - Seated Shoulder Circles AAROM with Dowel into Wall  - 1 x daily - 2 sets - 10 reps - Seated Shoulder Flexion Extension AAROM with Dowel into Wall  - 1 x daily - 2 sets - 10 reps - Seated Chest Press with Bar  - 1 x daily - 2 sets - 10 reps - Seated Shoulder External Rotation AAROM with Cane and Hand in Neutral  - 1 x daily - 2 sets - 10 reps - Seated Forearm Supination PROM with Caregiver  - 1 x daily - 2 sets - 10 reps - Seated Elbow Extension and Shoulder External Rotation AAROM at Table with Towel  - 1 x daily - 2 sets - 10 reps - Seated Shoulder Flexion Towel Slide at Table Top  - 1 x daily - 2 sets - 10 reps    GOALS: Goals reviewed with patient? Yes  SHORT TERM GOALS: Target date: 04/24/22  Pt and caregiver will be independent with HEP for shoulder and elbow ROM. Baseline: Pt not currently  engaging in any ROM/stretching program Goal status: MET - 04/21/22  2.  Pt will demonstrate improved active elbow extension to -30* to increase functional use Baseline: -34* Goal status: NOT MET - 04/21/22  3.  Pt will demonstrate improved shoulder flexion to 110* to increase functional use Baseline: 106* Goal status: NOT MET - 04/21/22   LONG TERM GOALS: Target date: 06/19/22  Pt will tolerate advanced HEP/stretching program for RUE. Baseline: No HEP at this time Goal status: IN PROGRESS  2.  Pt will demonstrate improved active elbow extension to -20* to increase functional use Baseline: -34* Goal status: IN PROGRESS  3.  Pt will demonstrate improved shoulder flexion to 115* to allow for improved ease with ADLs. Baseline: 106* Goal status: IN PROGRESS  4.  Pt will be independent with splint wear and care PRN Baseline: splinting needs TBD Goal status: IN PROGRESS  ASSESSMENT:  CLINICAL IMPRESSION: Treatment session with focus on progression of HEP with focus on shoulder flexion and elbow flexion/extension. Pt benefiting from hand over hand to maintain grasp  and proper placement of RUE on half ball and cane when completing shoulder external rotation with hand in neutral due to tone in wrist and fingers limiting sustained grasp.  Pt tolerating ROM and stretch with no reports of pain.  PERFORMANCE DEFICITS in functional skills including ADLs, IADLs, coordination, dexterity, tone, ROM, strength, pain, flexibility, FMC, GMC, decreased knowledge of precautions, and UE functional use, cognitive skills including learn, memory, and safety awareness.  IMPAIRMENTS are limiting patient from ADLs and IADLs.   COMORBIDITIES may have co-morbidities  that affects occupational performance. Patient will benefit from skilled OT to address above impairments and improve overall function.  MODIFICATION OR ASSISTANCE TO COMPLETE EVALUATION: Min-Moderate modification of tasks or assist with assess necessary to complete an evaluation.  OT OCCUPATIONAL PROFILE AND HISTORY: Detailed assessment: Review of records and additional review of physical, cognitive, psychosocial history related to current functional performance.  CLINICAL DECISION MAKING: Moderate - several treatment options, min-mod task modification necessary  REHAB POTENTIAL: Fair ongoing spasticity  EVALUATION COMPLEXITY: Moderate    PLAN: OT FREQUENCY: 1x/week  OT DURATION: 12 weeks (Plan for 8 visits over 12 weeks, but asking 12 weeks to allow for time to benefit upcoming botox injections)  PLANNED INTERVENTIONS: self care/ADL training, therapeutic exercise, therapeutic activity, neuromuscular re-education, manual therapy, passive range of motion, aquatic therapy, splinting, moist heat, cryotherapy, and patient/family education  RECOMMENDED OTHER SERVICES: may benefit from aquatic therapy OT  CONSULTED AND AGREED WITH PLAN OF CARE: Patient and family member/caregiver  PLAN FOR NEXT SESSION: review HEP and continue PROM/AAROM and self-ROM for shoulder, elbow, wrist ROM.   Tela Kotecki, [place],  OTR/L 05/19/2022, 10:20 AM

## 2022-05-20 ENCOUNTER — Telehealth: Payer: Self-pay | Admitting: Physical Medicine & Rehabilitation

## 2022-05-20 DIAGNOSIS — G8111 Spastic hemiplegia affecting right dominant side: Secondary | ICD-10-CM

## 2022-05-20 NOTE — Telephone Encounter (Signed)
Pt came to front window stating that [facility] a [facility]  AFO right leg old one is to tight and hurting

## 2022-05-22 ENCOUNTER — Ambulatory Visit: Payer: BC Managed Care – PPO | Attending: Physical Medicine & Rehabilitation

## 2022-05-22 DIAGNOSIS — R2689 Other abnormalities of gait and mobility: Secondary | ICD-10-CM | POA: Diagnosis present

## 2022-05-22 DIAGNOSIS — M25611 Stiffness of right shoulder, not elsewhere classified: Secondary | ICD-10-CM | POA: Diagnosis present

## 2022-05-22 DIAGNOSIS — M25621 Stiffness of right elbow, not elsewhere classified: Secondary | ICD-10-CM | POA: Diagnosis present

## 2022-05-22 DIAGNOSIS — R2681 Unsteadiness on feet: Secondary | ICD-10-CM

## 2022-05-22 DIAGNOSIS — R262 Difficulty in walking, not elsewhere classified: Secondary | ICD-10-CM | POA: Diagnosis present

## 2022-05-22 DIAGNOSIS — G8111 Spastic hemiplegia affecting right dominant side: Secondary | ICD-10-CM | POA: Diagnosis present

## 2022-05-22 DIAGNOSIS — M6281 Muscle weakness (generalized): Secondary | ICD-10-CM

## 2022-05-22 DIAGNOSIS — R293 Abnormal posture: Secondary | ICD-10-CM | POA: Diagnosis present

## 2022-05-22 DIAGNOSIS — M25641 Stiffness of right hand, not elsewhere classified: Secondary | ICD-10-CM | POA: Insufficient documentation

## 2022-05-22 NOTE — Therapy (Signed)
OUTPATIENT PHYSICAL THERAPY NEURO TREATMENT NOTE   Patient Name: Paul Kane MRN: [id number] DOB:24-Apr-1986, 36 y.o., male Today's Date: 05/22/2022   PCP: Caswell Corwin REFERRING PROVIDER: Meredith Staggers, MD    PT End of Session - 05/22/22 0759     Visit Number 10    Number of Visits 12    Date for PT Re-Evaluation 06/18/22    Authorization Type BCBS/ Medicaid of [place] for secondary    Authorization Time Period 11 visits from 10/10-12/25/23    Authorization - Visit Number 3    Authorization - Number of Visits 11    PT Start Time 0800    PT Stop Time 0845    PT Time Calculation (min) 45 min    Equipment Utilized During Treatment Gait belt    Activity Tolerance Patient tolerated treatment well    Behavior During Therapy [facility] for tasks assessed/performed;Impulsive   frequent redirection to task               Past Medical History:  Diagnosis Date   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[place])    Chronic hepatitis B ([place])    Fixed pupils    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[place])    Hypertension    Intracranial injury of other and unspecified nature, without mention of open intracranial wound, unspecified state of consciousness    Intracranial shunt    Paralysis ([place])    Spastic hemiplegia affecting dominant side ([place])    Stroke ([place])    Traumatic brain injury ([place])    Trouble swallowing    Visual disturbance    Weakness    Past Surgical History:  Procedure Laterality Date   cramiectomy     CRANIOPLASTY     REMOVAL OF GASTROSTOMY TUBE     VENTRICULOPERITONEAL SHUNT     Patient Active Problem List   Diagnosis Date Noted   SIRS due to infectious process with acute organ dysfunction ([place]) 03/14/2021   SIRS (systemic inflammatory response syndrome) ([place]) 03/12/2021   Syncope 03/12/2021   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[place]) 03/12/2021   Hypokalemia 03/12/2021   Lactic acidosis 03/12/2021   Mixed hyperlipidemia 04/25/2018   Seizure  disorder ([place]) 04/11/2018   Mechanical low back pain 05/12/2017   Dysarthria 12/20/2015   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/20/2012   Deafness, sensorineural 04/20/2012   Buzzing in ear 04/20/2012   Leaking percutaneous endoscopic gastrostomy (PEG) tube ([place]) 02/04/2012   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 pre-existing conscious levels, sequela ([place]) 10/12/2011   Cerebral infarct ([place]) 10/12/2011   Spastic hemiplegia of right dominant side due to noncerebrovascular etiology ([place]) 10/12/2011   Dysphagia 10/12/2011   Functioning G-tube 03/24/2011   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   Hemiplegia of dominant side ([place]) 07/28/2010   CEREBRAL HEMORRHAGE 07/28/2010   PERSONAL HISTORY OF TRAUMATIC BRAIN INJURY 07/02/2010   ALLERGIC RHINITIS CAUSE UNSPECIFIED 10/25/2008   GASTROENTERITIS 08/09/2008   CONTACT DERMATITIS&OTHER ECZEMA DUE DETERGENTS 08/18/2007   HEPATITIS B, CHRONIC 06/28/2007   Chronic viral hepatitis B without delta-agent ([place]) 06/28/2007    ONSET DATE: 2012  REFERRING DIAG: G81.11 (ICD-10-CM) - Spastic hemiplegia of right dominant side due to noncerebrovascular etiology   THERAPY DIAG:  Muscle weakness (generalized)  Unsteadiness on feet  Spastic hemiplegia affecting right dominant side, unspecified etiology ([place])  Difficulty in walking, not elsewhere classified  Other abnormalities of gait and mobility  Rationale for Evaluation and Treatment Rehabilitation  SUBJECTIVE:  SUBJECTIVE STATEMENT: Intend on continuing water exercise and gym routine after D/C  Pt accompanied by: family member father  PERTINENT HISTORY: . History of traumatic brain injury with hydrocephalus, meningitis,   and right pontine stroke.   2.  Spastic tetraplegia, dominant side more affected 3. severe oropharyngeal dysphagia--improved--tolerating diet well.  Continues on thickened liquids 4. Absence seizures 5. Mechanical Low back pain: due to posture/altered gait patterns--improved 6. Left great toe infection--podiatry f/u  PAIN:  Are you having pain? Yes: NPRS scale: 7/10 Pain location: R elbow Pain description: sharp Aggravating factors: fall last night Relieving factors: ice    PRECAUTIONS: Fall  WEIGHT BEARING RESTRICTIONS No  FALLS: Has patient fallen in last 6 months? Yes. Number of falls 2  LIVING ENVIRONMENT: Lives with: lives with their family Lives in: House/apartment Stairs: Yes: Internal: 12 steps; can reach both and External: 3 steps; can reach both Has following equipment at home: Grab bars and None  PLOF: Needs assistance with ADLs and Needs assistance with homemaking supervision for outdoor ambulation  PATIENT GOALS  Have a maintenance program  OBJECTIVE:    TODAY'S TREATMENT: 05/22/22 Activity Comments  Forward and lateral lunge on BOSU 3x10, therapist guide for RLE mechanics  RLE stair taps 3x10 8" step, 10# ankle weight  Sidestepping 3x60 sec 10# RLE  LAQ 4x10 10# RLE  Standing hamstring curls 3x10 10# RLE, tactile cues for form and chair seat for target  Standing on foam -feet apart: EO/EC -head turns 5x EO/EC -feet together EO, cues for RLE weight shift  Wide BOS Upright posture, 3x30 sec      PATIENT EDUCATION: Education details: Monitor R elbow for increased pain, increased warmth and redness and contact MD if needed Person educated: Patient and Parent Education method: Explanation Education comprehension: verbalized understanding      ------------------------------------------------------------------------   DIAGNOSTIC FINDINGS:   COGNITION: Overall cognitive status: History of cognitive impairments - at baseline   SENSATION: Not tested  COORDINATION: RLE  impaired  EDEMA:  none  MUSCLE TONE: RLE: Hypertonic, no ankle clonus detected   MUSCLE LENGTH: No obvious restrictions  DTRs:    POSTURE: weight shift left  LOWER EXTREMITY ROM:     Passive  Right Eval Left Eval  Hip flexion    Hip extension    Hip abduction    Hip adduction    Hip internal rotation    Hip external rotation    Knee flexion    Knee extension    Ankle dorsiflexion -9 WNL  Ankle plantarflexion    Ankle inversion    Ankle eversion     (Blank rows = not tested)  ---father reports that they have a static, progressive (JAS) brace that they use x 30 min daily on right ankle for dorsiflexion ROM  LOWER EXTREMITY MMT:    MMT Right Eval Left Eval  Hip flexion 5 4  Hip extension    Hip abduction 5 4  Hip adduction 5 4  Hip internal rotation    Hip external rotation    Knee flexion 5 4  Knee extension 5 4  Ankle dorsiflexion 5 4  Ankle plantarflexion    Ankle inversion 5 4  Ankle eversion 5 3  (Blank rows = not tested)  BED MOBILITY:  Independent  TRANSFERS: Assistive device utilized: None  Sit to stand: Complete Independence and Modified independence Stand to sit: Complete Independence Chair to chair: Complete Independence and Modified independence Floor:  DNT   CURB:  Level of Assistance:  SBA and CGA Assistive device utilized: None Curb Comments: CGA  STAIRS:  Level of Assistance: SBA  Stair Negotiation Technique: Step to Pattern with Bilateral Rails  Number of Stairs: 6   Height of Stairs: 4-6"  Comments: right knee wobble w/ descending  GAIT: Gait pattern: trunk rotated posterior- Right, knee extensor thrust Distance walked:  Assistive device utilized: None, right ankle hinged [place] Level of assistance: Modified independence and SBA Comments: notes right ankle inversion when walking barefoot  FUNCTIONAL TESTs:  5 times sit to stand: 19.10 sec--bias to LLE Timed up and go (TUG): DNT Berg Balance Scale: 32/56 10 meter  walk test: 12.39 sec = 2.6 ft/sec   PATIENT EDUCATION: Education details: regarding assessment findings and rationale of PT intervention Person educated: Patient and Parent Education method: Explanation Education comprehension: verbalized understanding   HOME EXERCISE PROGRAM: Access Code: [email or web address] URL: [email or web address] Date: 03/26/2022 Prepared by: Sherlyn Lees  Exercises - Seated Ankle Eversion with Resistance  - 1 x daily - 7 x weekly - 3 sets - 10 reps -Recumbent stationary bicycling    GOALS: Goals reviewed with patient? Yes  SHORT TERM GOALS: Target date: 05/07/2022  Patient will perform HEP with family/caregiver supervision for improved strength, balance, transfers, and gait  Baseline: in development to incorporate land and aquatic-based interventions Goal status: On-going,    LONG TERM GOALS: Target date: 06/18/2022  Reduce risk for falls per score 45/56 Berg Balance Test Baseline: 32/56; (04/16/22) 40/56 Goal status: Goal Met, UPGRADE  2.  Increase right eversion strength to 4/5 to improve ankle posture and decrease inversion when walking barefoot to reduce risk for fall/injury Baseline: 3/5; (04/16/22) 3+/5  Goal status: on-going  3.  Improve BLE strength and balance per time of 12 sec 5xSTS to reduce risk for falls Baseline: 19+ sec with LLE bias/weight shift; (04/16/22) 13.78 sec Goal status: Goal met, UPGRADE    ASSESSMENT:  CLINICAL IMPRESSION: Good control and isolation during activities with light tactile cues for RLE kinematics. Focus on selective control and weight shift to reduce risk for LOB and increased resistance for forced use RLE during compound and isolated movements. Continued sessions to refine HEP to prepare for DC   OBJECTIVE IMPAIRMENTS Abnormal gait, decreased balance, decreased coordination, difficulty walking, decreased strength, improper body mechanics, and postural dysfunction.   ACTIVITY LIMITATIONS  carrying, lifting, bending, squatting, stairs, and locomotion level  PARTICIPATION LIMITATIONS: cleaning, interpersonal relationship, community activity, and yard work  PERSONAL FACTORS Time since onset of injury/illness/exacerbation and 1 comorbidity: hx of TBI  are also affecting patient's functional outcome.   REHAB POTENTIAL: Good  CLINICAL DECISION MAKING: Evolving/moderate complexity  EVALUATION COMPLEXITY: Moderate  PLAN: PT FREQUENCY: 1x/week  PT DURATION: 12 weeks  PLANNED INTERVENTIONS: Therapeutic exercises, Therapeutic activity, Neuromuscular re-education, Balance training, Gait training, Patient/Family education, Self Care, Joint mobilization, Stair training, Vestibular training, Canalith repositioning, Orthotic/Fit training, DME instructions, Aquatic Therapy, Electrical stimulation, Wheelchair mobility training, Spinal mobilization, Cryotherapy, Moist heat, Taping, Ionotophoresis 38m/ml Dexamethasone, and Manual therapy  PLAN FOR NEXT SESSION: Update HEP (code that is in pt's chart on previous visit has vestibular exercises, so unsure which exercises pt has at home) for progression of gym routine   9:30 AM, 05/22/22 M. KSherlyn Lees PT, DPT Physical Therapist- [place]West [place]Office Number: [phone]

## 2022-05-22 NOTE — Telephone Encounter (Signed)
Order place in [place] for [employer] to replace/adjust AFO.

## 2022-05-26 ENCOUNTER — Ambulatory Visit: Payer: BC Managed Care – PPO | Admitting: Occupational Therapy

## 2022-05-26 ENCOUNTER — Ambulatory Visit: Payer: BC Managed Care – PPO

## 2022-05-26 DIAGNOSIS — M6281 Muscle weakness (generalized): Secondary | ICD-10-CM

## 2022-05-26 DIAGNOSIS — M25641 Stiffness of right hand, not elsewhere classified: Secondary | ICD-10-CM

## 2022-05-26 DIAGNOSIS — R2681 Unsteadiness on feet: Secondary | ICD-10-CM

## 2022-05-26 DIAGNOSIS — G8111 Spastic hemiplegia affecting right dominant side: Secondary | ICD-10-CM

## 2022-05-26 DIAGNOSIS — M25621 Stiffness of right elbow, not elsewhere classified: Secondary | ICD-10-CM

## 2022-05-26 DIAGNOSIS — R2689 Other abnormalities of gait and mobility: Secondary | ICD-10-CM

## 2022-05-26 DIAGNOSIS — M25611 Stiffness of right shoulder, not elsewhere classified: Secondary | ICD-10-CM

## 2022-05-26 DIAGNOSIS — R262 Difficulty in walking, not elsewhere classified: Secondary | ICD-10-CM

## 2022-05-26 NOTE — Therapy (Signed)
OUTPATIENT PHYSICAL THERAPY NEURO TREATMENT NOTE   Patient Name: Paul Kane MRN: [id number] DOB:1985/08/19, 36 y.o., male Today's Date: 05/26/2022   PCP: Caswell Corwin REFERRING PROVIDER: Meredith Staggers, MD    PT End of Session - 05/26/22 0804     Visit Number 11    Number of Visits 12    Date for PT Re-Evaluation 06/18/22    Authorization Type BCBS/ Medicaid of [place] for secondary    Authorization Time Period 11 visits from 10/10-12/25/23    Authorization - Visit Number 4    Authorization - Number of Visits 11    PT Start Time 0800    PT Stop Time 0845    PT Time Calculation (min) 45 min    Equipment Utilized During Treatment Gait belt    Activity Tolerance Patient tolerated treatment well    Behavior During Therapy [facility] for tasks assessed/performed;Impulsive   frequent redirection to task               Past Medical History:  Diagnosis Date   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[place])    Chronic hepatitis B ([place])    Fixed pupils    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[place])    Hypertension    Intracranial injury of other and unspecified nature, without mention of open intracranial wound, unspecified state of consciousness    Intracranial shunt    Paralysis ([place])    Spastic hemiplegia affecting dominant side ([place])    Stroke ([place])    Traumatic brain injury ([place])    Trouble swallowing    Visual disturbance    Weakness    Past Surgical History:  Procedure Laterality Date   cramiectomy     CRANIOPLASTY     REMOVAL OF GASTROSTOMY TUBE     VENTRICULOPERITONEAL SHUNT     Patient Active Problem List   Diagnosis Date Noted   SIRS due to infectious process with acute organ dysfunction ([place]) 03/14/2021   SIRS (systemic inflammatory response syndrome) ([place]) 03/12/2021   Syncope 03/12/2021   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[place]) 03/12/2021   Hypokalemia 03/12/2021   Lactic acidosis 03/12/2021   Mixed hyperlipidemia 04/25/2018   Seizure  disorder ([place]) 04/11/2018   Mechanical low back pain 05/12/2017   Dysarthria 12/20/2015   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/20/2012   Deafness, sensorineural 04/20/2012   Buzzing in ear 04/20/2012   Leaking percutaneous endoscopic gastrostomy (PEG) tube ([place]) 02/04/2012   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 pre-existing conscious levels, sequela ([place]) 10/12/2011   Cerebral infarct ([place]) 10/12/2011   Spastic hemiplegia of right dominant side due to noncerebrovascular etiology ([place]) 10/12/2011   Dysphagia 10/12/2011   Functioning G-tube 03/24/2011   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   Hemiplegia of dominant side ([place]) 07/28/2010   CEREBRAL HEMORRHAGE 07/28/2010   PERSONAL HISTORY OF TRAUMATIC BRAIN INJURY 07/02/2010   ALLERGIC RHINITIS CAUSE UNSPECIFIED 10/25/2008   GASTROENTERITIS 08/09/2008   CONTACT DERMATITIS&OTHER ECZEMA DUE DETERGENTS 08/18/2007   HEPATITIS B, CHRONIC 06/28/2007   Chronic viral hepatitis B without delta-agent ([place]) 06/28/2007    ONSET DATE: 2012  REFERRING DIAG: G81.11 (ICD-10-CM) - Spastic hemiplegia of right dominant side due to noncerebrovascular etiology   THERAPY DIAG:  Muscle weakness (generalized)  Unsteadiness on feet  Difficulty in walking, not elsewhere classified  Other abnormalities of gait and mobility  Rationale for Evaluation and Treatment Rehabilitation  SUBJECTIVE:  SUBJECTIVE STATEMENT: Feeling good, getting a new brace in the near future  Pt accompanied by: family member father  PERTINENT HISTORY: . History of traumatic brain injury with hydrocephalus, meningitis,   and right pontine stroke.   2. Spastic tetraplegia, dominant side more affected 3. severe oropharyngeal  dysphagia--improved--tolerating diet well.  Continues on thickened liquids 4. Absence seizures 5. Mechanical Low back pain: due to posture/altered gait patterns--improved 6. Left great toe infection--podiatry f/u  PAIN:  Are you having pain? Yes: NPRS scale: 7/10 Pain location: R elbow Pain description: sharp Aggravating factors: fall last night Relieving factors: ice    PRECAUTIONS: Fall  WEIGHT BEARING RESTRICTIONS No  FALLS: Has patient fallen in last 6 months? Yes. Number of falls 2  LIVING ENVIRONMENT: Lives with: lives with their family Lives in: House/apartment Stairs: Yes: Internal: 12 steps; can reach both and External: 3 steps; can reach both Has following equipment at home: Grab bars and None  PLOF: Needs assistance with ADLs and Needs assistance with homemaking supervision for outdoor ambulation  PATIENT GOALS  Have a maintenance program  OBJECTIVE:   TODAY'S TREATMENT: 05/26/22 Activity Comments  BOSU lunges 2x10 Anterior and lateral, cues for RLE sequence and coordination and facilitation of knee extension for lateral  Cone taps 3x10 5# RLE, 6-12" cone  Sidestepping 2x2 min 5# RLRE  LAQ 3x10 RLE 5#   Standing on foam -EO/EC 3x15 sec -head turns EO/EC -feet together EO/EC 2x15 sec  Assisted lower  body stretching 2x60 sec all major muscle groups to improve ROM and inhibit tone      TODAY'S TREATMENT: 05/22/22 Activity Comments  Forward and lateral lunge on BOSU 3x10, therapist guide for RLE mechanics  RLE stair taps 3x10 8" step, 10# ankle weight  Sidestepping 3x60 sec 10# RLE  LAQ 4x10 10# RLE  Standing hamstring curls 3x10 10# RLE, tactile cues for form and chair seat for target  Standing on foam -feet apart: EO/EC -head turns 5x EO/EC -feet together EO, cues for RLE weight shift  Wide BOS Upright posture, 3x30 sec      PATIENT EDUCATION: Education details: Monitor R elbow for increased pain, increased warmth and redness and contact MD if  needed Person educated: Patient and Parent Education method: Explanation Education comprehension: verbalized understanding      ------------------------------------------------------------------------   DIAGNOSTIC FINDINGS:   COGNITION: Overall cognitive status: History of cognitive impairments - at baseline   SENSATION: Not tested  COORDINATION: RLE impaired  EDEMA:  none  MUSCLE TONE: RLE: Hypertonic, no ankle clonus detected   MUSCLE LENGTH: No obvious restrictions  DTRs:    POSTURE: weight shift left  LOWER EXTREMITY ROM:     Passive  Right Eval Left Eval  Hip flexion    Hip extension    Hip abduction    Hip adduction    Hip internal rotation    Hip external rotation    Knee flexion    Knee extension    Ankle dorsiflexion -9 WNL  Ankle plantarflexion    Ankle inversion    Ankle eversion     (Blank rows = not tested)  ---father reports that they have a static, progressive (JAS) brace that they use x 30 min daily on right ankle for dorsiflexion ROM  LOWER EXTREMITY MMT:    MMT Right Eval Left Eval  Hip flexion 5 4  Hip extension    Hip abduction 5 4  Hip adduction 5 4  Hip internal rotation    Hip external  rotation    Knee flexion 5 4  Knee extension 5 4  Ankle dorsiflexion 5 4  Ankle plantarflexion    Ankle inversion 5 4  Ankle eversion 5 3  (Blank rows = not tested)  BED MOBILITY:  Independent  TRANSFERS: Assistive device utilized: None  Sit to stand: Complete Independence and Modified independence Stand to sit: Complete Independence Chair to chair: Complete Independence and Modified independence Floor:  DNT   CURB:  Level of Assistance: SBA and CGA Assistive device utilized: None Curb Comments: CGA  STAIRS:  Level of Assistance: SBA  Stair Negotiation Technique: Step to Pattern with Bilateral Rails  Number of Stairs: 6   Height of Stairs: 4-6"  Comments: right knee wobble w/ descending  GAIT: Gait pattern: trunk  rotated posterior- Right, knee extensor thrust Distance walked:  Assistive device utilized: None, right ankle hinged [place] Level of assistance: Modified independence and SBA Comments: notes right ankle inversion when walking barefoot  FUNCTIONAL TESTs:  5 times sit to stand: 19.10 sec--bias to LLE Timed up and go (TUG): DNT Berg Balance Scale: 32/56 10 meter walk test: 12.39 sec = 2.6 ft/sec   PATIENT EDUCATION: Education details: regarding assessment findings and rationale of PT intervention Person educated: Patient and Parent Education method: Explanation Education comprehension: verbalized understanding   HOME EXERCISE PROGRAM: Access Code: [email or web address] URL: [email or web address] Date: 03/26/2022 Prepared by: Sherlyn Lees  Exercises - Seated Ankle Eversion with Resistance  - 1 x daily - 7 x weekly - 3 sets - 10 reps -Recumbent stationary bicycling    GOALS: Goals reviewed with patient? Yes  SHORT TERM GOALS: Target date: 05/07/2022  Patient will perform HEP with family/caregiver supervision for improved strength, balance, transfers, and gait  Baseline: in development to incorporate land and aquatic-based interventions Goal status: On-going,    LONG TERM GOALS: Target date: 06/18/2022  Reduce risk for falls per score 45/56 Berg Balance Test Baseline: 32/56; (04/16/22) 40/56 Goal status: Goal Met, UPGRADE  2.  Increase right eversion strength to 4/5 to improve ankle posture and decrease inversion when walking barefoot to reduce risk for fall/injury Baseline: 3/5; (04/16/22) 3+/5  Goal status: on-going  3.  Improve BLE strength and balance per time of 12 sec 5xSTS to reduce risk for falls Baseline: 19+ sec with LLE bias/weight shift; (04/16/22) 13.78 sec Goal status: Goal met, UPGRADE    ASSESSMENT:  CLINICAL IMPRESSION: Focus of tx session on activities to replicate in gym environment at discharge with emphasis on large amplitude gross motor  movements to inhibit RLE tone and incoporate/facilitate into functional balance and motor control.  Doing well with therapist providing tapered feedback via tactile/verbal cues for RLE control. Continued sessions to refine HEP and train caregiver in relevant techniques   OBJECTIVE IMPAIRMENTS Abnormal gait, decreased balance, decreased coordination, difficulty walking, decreased strength, improper body mechanics, and postural dysfunction.   ACTIVITY LIMITATIONS carrying, lifting, bending, squatting, stairs, and locomotion level  PARTICIPATION LIMITATIONS: cleaning, interpersonal relationship, community activity, and yard work  PERSONAL FACTORS Time since onset of injury/illness/exacerbation and 1 comorbidity: hx of TBI  are also affecting patient's functional outcome.   REHAB POTENTIAL: Good  CLINICAL DECISION MAKING: Evolving/moderate complexity  EVALUATION COMPLEXITY: Moderate  PLAN: PT FREQUENCY: 1x/week  PT DURATION: 12 weeks  PLANNED INTERVENTIONS: Therapeutic exercises, Therapeutic activity, Neuromuscular re-education, Balance training, Gait training, Patient/Family education, Self Care, Joint mobilization, Stair training, Vestibular training, Canalith repositioning, Orthotic/Fit training, DME instructions, Aquatic Therapy, Electrical stimulation, Wheelchair mobility training, Spinal mobilization, Cryotherapy, Moist heat,  Taping, Ionotophoresis 45m/ml Dexamethasone, and Manual therapy  PLAN FOR NEXT SESSION: Update HEP (code that is in pt's chart on previous visit has vestibular exercises, so unsure which exercises pt has at home) for progression of gym routine   8:04 AM, 05/26/22 M. KSherlyn Lees PT, DPT Physical Therapist- CHanskaOffice Number: [phone]

## 2022-05-26 NOTE — Therapy (Signed)
OUTPATIENT OCCUPATIONAL THERAPY  Treatment Note  Patient Name: Paul Kane MRN: [id number] DOB:Nov 28, 1985, 36 y.o., male Today's Date: 05/26/2022  PCP: Jonathon Bellows, [place] PROVIDER: Meredith Staggers, MD   OT End of Session - 05/26/22 0849     Visit Number 8    Number of Visits 9    Date for OT Re-Evaluation 06/19/22    Authorization Type [employer]BS primary/ [place] Medicaid secondary    Authorization Time Period Auth#: [id number] , approved for 8 OT visits from 04/02/2022-06/24/2022    Authorization - Visit Number 7    Authorization - Number of Visits 8    OT Start Time 0847    OT Stop Time 0930    OT Time Calculation (min) 43 min                    Past Medical History:  Diagnosis Date   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[place])    Chronic hepatitis B ([place])    Fixed pupils    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[place])    Hypertension    Intracranial injury of other and unspecified nature, without mention of open intracranial wound, unspecified state of consciousness    Intracranial shunt    Paralysis ([place])    Spastic hemiplegia affecting dominant side ([place])    Stroke ([place])    Traumatic brain injury ([place])    Trouble swallowing    Visual disturbance    Weakness    Past Surgical History:  Procedure Laterality Date   cramiectomy     CRANIOPLASTY     REMOVAL OF GASTROSTOMY TUBE     VENTRICULOPERITONEAL SHUNT     Patient Active Problem List   Diagnosis Date Noted   SIRS due to infectious process with acute organ dysfunction ([place]) 03/14/2021   SIRS (systemic inflammatory response syndrome) ([place]) 03/12/2021   Syncope 03/12/2021   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[place]) 03/12/2021   Hypokalemia 03/12/2021   Lactic acidosis 03/12/2021   Mixed hyperlipidemia 04/25/2018   Seizure disorder ([place]) 04/11/2018   Mechanical low back pain 05/12/2017   Dysarthria 12/20/2015   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/20/2012   Deafness, sensorineural  04/20/2012   Buzzing in ear 04/20/2012   Leaking percutaneous endoscopic gastrostomy (PEG) tube ([place]) 02/04/2012   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 pre-existing conscious levels, sequela ([place]) 10/12/2011   Cerebral infarct ([place]) 10/12/2011   Spastic hemiplegia of right dominant side due to noncerebrovascular etiology ([place]) 10/12/2011   Dysphagia 10/12/2011   Functioning G-tube 03/24/2011   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   Hemiplegia of dominant side ([place]) 07/28/2010   CEREBRAL HEMORRHAGE 07/28/2010   PERSONAL HISTORY OF TRAUMATIC BRAIN INJURY 07/02/2010   ALLERGIC RHINITIS CAUSE UNSPECIFIED 10/25/2008   GASTROENTERITIS 08/09/2008   CONTACT DERMATITIS&OTHER ECZEMA DUE DETERGENTS 08/18/2007   HEPATITIS B, CHRONIC 06/28/2007   Chronic viral hepatitis B without delta-agent ([place]) 06/28/2007    ONSET DATE: 03/04/22 (referral date)  REFERRING DIAG: G81.11 (ICD-10-CM) - Spastic hemiplegia of right dominant side due to noncerebrovascular etiology   THERAPY DIAG:  Spastic hemiplegia affecting right dominant side, unspecified etiology ([place])  Stiffness of right shoulder, not elsewhere classified  Stiffness of right elbow, not elsewhere classified  Stiffness of right hand, not elsewhere classified  Muscle weakness (generalized)  Unsteadiness on feet  Rationale for Evaluation and Treatment Rehabilitation  SUBJECTIVE:   SUBJECTIVE STATEMENT: Pt's father reports that he is needing a new AFO.  Pt accompanied by: self and family member (dad)  PERTINENT HISTORY: 1. History of traumatic brain injury with hydrocephalus, meningitis,   and right pontine stroke (2011) 2. Spastic tetraplegia, dominant side more affected 3. severe oropharyngeal dysphagia--improved--tolerating diet well.  Continues on thickened liquids 4. Absence seizures 5. Mechanical Low back pain: due to posture/altered gait patterns--improved 6. Left great  toe infection--podiatry f/u    PRECAUTIONS: Fall and Other: h/o seizures  WEIGHT BEARING RESTRICTIONS No  PAIN:  Are you having pain? No  FALLS: Has patient fallen in last 6 months? Yes. Number of falls 4-5  LIVING ENVIRONMENT: Lives with: lives with their family Lives in: House/apartment Stairs: Yes: Internal: full flight to 2nd floor, but bedroom and bathroom on main floor and does not need to go up steps; can reach both and External: 3 steps; can reach both Has following equipment at home: shower chair and Grab bars  PLOF: Needs assistance with ADLs  PATIENT GOALS "arm" stronger and flexible  OBJECTIVE:   HAND DOMINANCE: Right  ADLs: Mobility: Mod I around the house, hand held assist when in the community Grooming: Mod I  UB Dressing: Mod I LB Dressing: family assists with donning AFO and tying shoes Toileting: Mod I - utilizes bidet for hygiene Bathing: family assists with bathing, he completes ~90% Tub Shower transfers: Supervision/assist with shower transfers Equipment: Shower seat with back, Grab bars, and Walk in shower  UPPER EXTREMITY ROM     Active ROM Right eval Left eval Right 04/21/22  Shoulder flexion 106  104  Shoulder abduction     Shoulder adduction     Shoulder extension     Shoulder internal rotation     Shoulder external rotation     Elbow flexion 123  132  Elbow extension -34  -38  Wrist flexion 12    Wrist extension -12    Wrist ulnar deviation     Wrist radial deviation     Wrist pronation     Wrist supination     (Blank rows = not tested)  Passive ROM Right eval Left eval Right 04/21/22  Shoulder flexion 110  115  Shoulder abduction     Shoulder adduction     Shoulder extension     Shoulder internal rotation     Shoulder external rotation     Elbow flexion 143  145  Elbow extension -32  -34  Wrist flexion 40    Wrist extension 34    Wrist ulnar deviation     Wrist radial deviation     Wrist pronation     Wrist  supination     (Blank rows = not tested)  HAND FUNCTION: Pt is able to manually open hand with great effort  MUSCLE TONE: RUE: Moderate, Hypertonic, and Modifed Ashworth Scale 3 = Considerable increase in muscle tone, passive movement difficult  COGNITION: Overall cognitive status: Impaired, History of cognitive impairments - at baseline, and father present providing majority of information --------------------------------------------------------------------------------------------------------------------------------------------------------  TODAY'S TREATMENT:  05/26/22 NMR/stretch: PROM with focus on supination/pronation, elbow flexion/extension, wrist flexion/extension.  OT providing manual facilitation/stretch in all ranges with pt unable to maintain mobility post stretch due to tone. AROM: engaged in elbow flexion/extension with cues to reach towards target to facilitate increased stretch/ROM. OT providing tactile cues at trunk to maintain upright sitting posture, as pt with tendency to lean into movement to compensate for decreased ROM.   AAROM: forward reaching and reaching towards floor with LUE over RUE to further facilitate   increased stretch and ROM.  Pt demonstrating improved tolerance to stretch with use of gravity to aid in ROM.  Incorporated pendulum swing with LUE providing support to maintain elbow extension. Saebo glide: Engaged in shoulder flexion and elbow extension with therapist providing hand over hand assist to maintain grasp on handle.  Pt able to elicit increased shoulder flexion and elbow extension with assist and during exercises, however with minimal carryover after exercise.   05/19/22 NMR/stretch: WB through RUE on half ball to accommodate for decreased hand opening and wrist extension. OT facilitating increased shoulder and elbow ROM with reaching outside BOS with LUE while maintaining WB through RUE.  OT with hand placement over R hand to obtain and maintain  positioning and providing tactile cues for increased stretch. Therapeutic exercises: OT reiterated HEP with pt demonstrating each exercise. Pt requiring mod demonstration cues for proper technique with shoulder circles with dowel with focus on completing both in clockwise and counter clockwise direction as well as when completing shoulder external rotation with cane and hand in neutral to allow for increased stretch. Palpated elbow and surrounding area s/p fall 2 weeks ago, elbow boggy but no c/o pain to palpation or with movements.    05/05/22 AAROM: Closed- chain AAROM with hand over hand while rolling large therapy ball forward and then horizontally with focus on shoulder flexion, elbow extension, and horizontal abduction/adduction. AAROM: Closed -chain AAROM while holding hula hoop with focus on chest press, internal/external rotation, and shoulder flexion.  OT providing hand over hand to maintain grasp on hula hoop, progressing to taping with coban to maintain grasp during exercise.  Pt continues to demonstrate decreased elbow extension and shoulder flexion, especially when attempting to reach overhead. Wall slides: hand over hand wall slides with focus on shoulder flexion and diagonal reaching. Pt able to achieve increased shoulder flexion with wall slides and sustained reach for 3-5 seconds in flexion.    PATIENT EDUCATION: Education details: Reiterated HEP for shoulder, elbow, and forearm stretches Person educated: Patient and Parent Education method: Explanation, Demonstration, Tactile cues, and Handouts Education comprehension: needs further education   HOME EXERCISE PROGRAM: Access Code: [email or web address] URL: [email or web address] Date: 05/26/2022 Prepared by: [facility] - Outpatient  Rehab - [facility]  Exercises - Seated Shoulder Circles AAROM with Dowel into Wall  - 1 x daily - 2 sets - 10 reps - Seated Shoulder Flexion Extension AAROM with Dowel into Wall  - 1 x  daily - 2 sets - 10 reps - Seated Chest Press with Bar  - 1 x daily - 2 sets - 10 reps - Seated Shoulder External Rotation AAROM with Cane and Hand in Neutral  - 1 x daily - 2 sets - 10 reps - Seated Forearm Supination PROM with Caregiver  - 1 x daily - 2 sets - 10 reps - Seated Elbow Extension and Shoulder External Rotation AAROM at Table with Towel  - 1 x daily - 2 sets - 10 reps - Seated Shoulder Flexion Towel Slide at Table Top  - 1 x daily - 2 sets - 10 reps - Overhead Wall Slide with Towel  - 1 x daily - 2 sets - 10 reps - Seated shoulder and elbow stretch  - 1 x daily - 2 sets - 10 reps - Seated Wrist Flexion Extension PROM  - 1 x daily - 2 sets - 10 reps    GOALS: Goals reviewed with patient? Yes  SHORT TERM GOALS: Target date: 04/24/22  Pt and caregiver   will be independent with HEP for shoulder and elbow ROM. Baseline: Pt not currently engaging in any ROM/stretching program Goal status: MET - 04/21/22  2.  Pt will demonstrate improved active elbow extension to -30* to increase functional use Baseline: -34* Goal status: NOT MET - 04/21/22  3.  Pt will demonstrate improved shoulder flexion to 110* to increase functional use Baseline: 106* Goal status: NOT MET - 04/21/22   LONG TERM GOALS: Target date: 06/19/22  Pt will tolerate advanced HEP/stretching program for RUE. Baseline: No HEP at this time Goal status: IN PROGRESS  2.  Pt will demonstrate improved active elbow extension to -20* to increase functional use Baseline: -34* Goal status: IN PROGRESS  3.  Pt will demonstrate improved shoulder flexion to 115* to allow for improved ease with ADLs. Baseline: 106* Goal status: IN PROGRESS  4.  Pt will be independent with splint wear and care PRN Baseline: splinting needs TBD Goal status: IN PROGRESS  ASSESSMENT:  CLINICAL IMPRESSION: Treatment session with focus on progression of HEP with focus on shoulder flexion and elbow flexion/extension. Pt continues to benefit  from hand over hand assist to maintain grasp on saebo glide handle. Pt able to elicit increased shoulder flexion and elbow extension with assist and during exercises, however with minimal carryover after exercise. Pt tolerating ROM and stretch with no reports of pain.  PERFORMANCE DEFICITS in functional skills including ADLs, IADLs, coordination, dexterity, tone, ROM, strength, pain, flexibility, FMC, GMC, decreased knowledge of precautions, and UE functional use, cognitive skills including learn, memory, and safety awareness.  IMPAIRMENTS are limiting patient from ADLs and IADLs.   COMORBIDITIES may have co-morbidities  that affects occupational performance. Patient will benefit from skilled OT to address above impairments and improve overall function.  MODIFICATION OR ASSISTANCE TO COMPLETE EVALUATION: Min-Moderate modification of tasks or assist with assess necessary to complete an evaluation.  OT OCCUPATIONAL PROFILE AND HISTORY: Detailed assessment: Review of records and additional review of physical, cognitive, psychosocial history related to current functional performance.  CLINICAL DECISION MAKING: Moderate - several treatment options, min-mod task modification necessary  REHAB POTENTIAL: Fair ongoing spasticity  EVALUATION COMPLEXITY: Moderate    PLAN: OT FREQUENCY: 1x/week  OT DURATION: 12 weeks (Plan for 8 visits over 12 weeks, but asking 12 weeks to allow for time to benefit upcoming botox injections)  PLANNED INTERVENTIONS: self care/ADL training, therapeutic exercise, therapeutic activity, neuromuscular re-education, manual therapy, passive range of motion, aquatic therapy, splinting, moist heat, cryotherapy, and patient/family education  RECOMMENDED OTHER SERVICES: may benefit from aquatic therapy OT  CONSULTED AND AGREED WITH PLAN OF CARE: Patient and family member/caregiver  PLAN FOR NEXT SESSION: review HEP and continue PROM/AAROM and self-ROM for shoulder, elbow,  wrist ROM.  Take measurements and d/c after next session based on measurements.   HOXIE, SARAH, OTR/L 05/26/2022, 10:17 AM          

## 2022-06-04 ENCOUNTER — Ambulatory Visit: Payer: BC Managed Care – PPO

## 2022-06-04 DIAGNOSIS — R2681 Unsteadiness on feet: Secondary | ICD-10-CM

## 2022-06-04 DIAGNOSIS — R293 Abnormal posture: Secondary | ICD-10-CM

## 2022-06-04 DIAGNOSIS — R2689 Other abnormalities of gait and mobility: Secondary | ICD-10-CM

## 2022-06-04 DIAGNOSIS — M6281 Muscle weakness (generalized): Secondary | ICD-10-CM | POA: Diagnosis not present

## 2022-06-04 DIAGNOSIS — R262 Difficulty in walking, not elsewhere classified: Secondary | ICD-10-CM

## 2022-06-04 NOTE — Therapy (Signed)
OUTPATIENT PHYSICAL THERAPY NEURO TREATMENT NOTE and D/C Summary   Patient Name: Paul Kane MRN: [id number] DOB:24-Aug-1985, 36 y.o., male Today's Date: 06/04/2022   PCP: Caswell Corwin REFERRING PROVIDER: Meredith Staggers, MD   PHYSICAL THERAPY DISCHARGE SUMMARY  Visits from Start of Care: 12  Current functional level related to goals / functional outcomes: Able to meet STG and 2/3 LTG   Remaining deficits: Balance and RLE deficits   Education / Equipment: HEP   Patient agrees to discharge. Patient goals were partially met. Patient is being discharged due to meeting the stated rehab goals.   PT End of Session - 06/04/22 0800     Visit Number 12    Number of Visits 12    Date for PT Re-Evaluation 06/18/22    Authorization Type BCBS/ Medicaid of [place] for secondary    Authorization Time Period 11 visits from 10/10-12/25/23    Authorization - Visit Number 5    Authorization - Number of Visits 11    PT Start Time 0800    PT Stop Time 0845    PT Time Calculation (min) 45 min    Equipment Utilized During Treatment Gait belt    Activity Tolerance Patient tolerated treatment well    Behavior During Therapy WFL for tasks assessed/performed;Impulsive   frequent redirection to task               Past Medical History:  Diagnosis Date   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[place])    Chronic hepatitis B ([place])    Fixed pupils    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[place])    Hypertension    Intracranial injury of other and unspecified nature, without mention of open intracranial wound, unspecified state of consciousness    Intracranial shunt    Paralysis ([place])    Spastic hemiplegia affecting dominant side ([place])    Stroke ([place])    Traumatic brain injury ([place])    Trouble swallowing    Visual disturbance    Weakness    Past Surgical History:  Procedure Laterality Date   cramiectomy     CRANIOPLASTY     REMOVAL OF GASTROSTOMY TUBE     VENTRICULOPERITONEAL SHUNT      Patient Active Problem List   Diagnosis Date Noted   SIRS due to infectious process with acute organ dysfunction ([place]) 03/14/2021   SIRS (systemic inflammatory response syndrome) ([place]) 03/12/2021   Syncope 03/12/2021   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[place]) 03/12/2021   Hypokalemia 03/12/2021   Lactic acidosis 03/12/2021   Mixed hyperlipidemia 04/25/2018   Seizure disorder ([place]) 04/11/2018   Mechanical low back pain 05/12/2017   Dysarthria 12/20/2015   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/20/2012   Deafness, sensorineural 04/20/2012   Buzzing in ear 04/20/2012   Leaking percutaneous endoscopic gastrostomy (PEG) tube ([place]) 02/04/2012   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 pre-existing conscious levels, sequela ([place]) 10/12/2011   Cerebral infarct ([place]) 10/12/2011   Spastic hemiplegia of right dominant side due to noncerebrovascular etiology ([place]) 10/12/2011   Dysphagia 10/12/2011   Functioning G-tube 03/24/2011   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   Hemiplegia of dominant side ([place]) 07/28/2010   CEREBRAL HEMORRHAGE 07/28/2010   PERSONAL HISTORY OF TRAUMATIC BRAIN INJURY 07/02/2010   ALLERGIC RHINITIS CAUSE UNSPECIFIED 10/25/2008   GASTROENTERITIS 08/09/2008   CONTACT DERMATITIS&OTHER ECZEMA DUE DETERGENTS 08/18/2007   HEPATITIS B, CHRONIC 06/28/2007   Chronic viral hepatitis B without delta-agent ([place]) 06/28/2007  ONSET DATE: 2012  REFERRING DIAG: G81.11 (ICD-10-CM) - Spastic hemiplegia of right dominant side due to noncerebrovascular etiology   THERAPY DIAG:  Muscle weakness (generalized)  Unsteadiness on feet  Difficulty in walking, not elsewhere classified  Other abnormalities of gait and mobility  Abnormal posture  Rationale for Evaluation and Treatment Rehabilitation  SUBJECTIVE:                                                                                                                                                                                               SUBJECTIVE STATEMENT: Feeling good, getting a new brace in the near future  Pt accompanied by: family member father  PERTINENT HISTORY: . History of traumatic brain injury with hydrocephalus, meningitis,   and right pontine stroke.   2. Spastic tetraplegia, dominant side more affected 3. severe oropharyngeal dysphagia--improved--tolerating diet well.  Continues on thickened liquids 4. Absence seizures 5. Mechanical Low back pain: due to posture/altered gait patterns--improved 6. Left great toe infection--podiatry f/u  PAIN:  Are you having pain? Yes: NPRS scale: 7/10 Pain location: R elbow Pain description: sharp Aggravating factors: fall last night Relieving factors: ice    PRECAUTIONS: Fall  WEIGHT BEARING RESTRICTIONS No  FALLS: Has patient fallen in last 6 months? Yes. Number of falls 2  LIVING ENVIRONMENT: Lives with: lives with their family Lives in: House/apartment Stairs: Yes: Internal: 12 steps; can reach both and External: 3 steps; can reach both Has following equipment at home: Grab bars and None  PLOF: Needs assistance with ADLs and Needs assistance with homemaking supervision for outdoor ambulation  PATIENT GOALS  Have a maintenance program  OBJECTIVE:   TODAY'S TREATMENT: 06/04/22 Activity Comments  LE warmup to prepare for dynamic activities -seated march, LAQ  5xSTS 13 sec  Berg Balance Test 46/56  HEP/gym routine review 2x10           TODAY'S TREATMENT: 05/26/22 Activity Comments  BOSU lunges 2x10 Anterior and lateral, cues for RLE sequence and coordination and facilitation of knee extension for lateral  Cone taps 3x10 5# RLE, 6-12" cone  Sidestepping 2x2 min 5# RLRE  LAQ 3x10 RLE 5#   Standing on foam -EO/EC 3x15 sec -head turns EO/EC -feet together EO/EC 2x15 sec  Assisted lower  body stretching 2x60 sec all major muscle groups to improve ROM and inhibit tone       TODAY'S TREATMENT: 05/22/22 Activity Comments  Forward and lateral lunge on BOSU 3x10, therapist guide for RLE mechanics  RLE stair taps 3x10 8" step, 10# ankle weight  Sidestepping 3x60 sec 10# RLE  LAQ 4x10 10# RLE  Standing hamstring curls 3x10 10# RLE, tactile cues for form and chair seat for target  Standing on foam -feet apart: EO/EC -head turns 5x EO/EC -feet together EO, cues for RLE weight shift  Wide BOS Upright posture, 3x30 sec      PATIENT EDUCATION: Education details: Monitor R elbow for increased pain, increased warmth and redness and contact MD if needed Person educated: Patient and Parent Education method: Explanation Education comprehension: verbalized understanding      ------------------------------------------------------------------------   DIAGNOSTIC FINDINGS:   COGNITION: Overall cognitive status: History of cognitive impairments - at baseline   SENSATION: Not tested  COORDINATION: RLE impaired  EDEMA:  none  MUSCLE TONE: RLE: Hypertonic, no ankle clonus detected   MUSCLE LENGTH: No obvious restrictions  DTRs:    POSTURE: weight shift left  LOWER EXTREMITY ROM:     Passive  Right Eval Left Eval  Hip flexion    Hip extension    Hip abduction    Hip adduction    Hip internal rotation    Hip external rotation    Knee flexion    Knee extension    Ankle dorsiflexion -9 WNL  Ankle plantarflexion    Ankle inversion    Ankle eversion     (Blank rows = not tested)  ---father reports that they have a static, progressive (JAS) brace that they use x 30 min daily on right ankle for dorsiflexion ROM  LOWER EXTREMITY MMT:    MMT Right Eval Left Eval  Hip flexion 5 4  Hip extension    Hip abduction 5 4  Hip adduction 5 4  Hip internal rotation    Hip external rotation    Knee flexion 5 4  Knee extension 5 4  Ankle dorsiflexion 5 4  Ankle plantarflexion    Ankle inversion 5 4  Ankle eversion 5 3  (Blank rows = not  tested)  BED MOBILITY:  Independent  TRANSFERS: Assistive device utilized: None  Sit to stand: Complete Independence and Modified independence Stand to sit: Complete Independence Chair to chair: Complete Independence and Modified independence Floor:  DNT   CURB:  Level of Assistance: SBA and CGA Assistive device utilized: None Curb Comments: CGA  STAIRS:  Level of Assistance: SBA  Stair Negotiation Technique: Step to Pattern with Bilateral Rails  Number of Stairs: 6   Height of Stairs: 4-6"  Comments: right knee wobble w/ descending  GAIT: Gait pattern: trunk rotated posterior- Right, knee extensor thrust Distance walked:  Assistive device utilized: None, right ankle hinged [place] Level of assistance: Modified independence and SBA Comments: notes right ankle inversion when walking barefoot  FUNCTIONAL TESTs:  5 times sit to stand: 19.10 sec--bias to LLE Timed up and go (TUG): DNT Berg Balance Scale: 32/56 10 meter walk test: 12.39 sec = 2.6 ft/sec   PATIENT EDUCATION: Education details: regarding assessment findings and rationale of PT intervention Person educated: Patient and Parent Education method: Explanation Education comprehension: verbalized understanding   HOME EXERCISE PROGRAM: Access Code: [email or web address] URL: [email or web address] Date: 03/26/2022 Prepared by: Sherlyn Lees  Exercises - Seated Ankle Eversion with Resistance  - 1 x daily - 7 x weekly - 3 sets - 10 reps -Recumbent stationary bicycling    GOALS: Goals reviewed with patient? Yes  SHORT TERM GOALS: Target date: 05/07/2022  Patient will perform HEP with family/caregiver supervision for improved strength, balance, transfers, and gait  Baseline: in development to incorporate land and aquatic-based interventions Goal status: MET    LONG TERM  GOALS: Target date: 06/18/2022  Reduce risk for falls per score 45/56 Berg Balance Test Baseline: 32/56; (04/16/22) 40/56;  (06/04/22) 46/56 Goal status: Goal Met  2.  Increase right eversion strength to 4/5 to improve ankle posture and decrease inversion when walking barefoot to reduce risk for fall/injury Baseline: 3/5; (04/16/22) 3+/5; (06/04/22) 4/5 Goal status: MET  3.  Improve BLE strength and balance per time of 12 sec 5xSTS to reduce risk for falls Baseline: 19+ sec with LLE bias/weight shift; (04/16/22) 13.78 sec; (06/04/22) 13 sec Goal status: NOT MET    ASSESSMENT:  CLINICAL IMPRESSION: Pt engaged in review of POC details and repeat of objective tests/measures which reveal overall improved status since start of care w/ ability to achieve low risk for falls per score Berg Balance Test and improved right ankle eversion strength, although the latter is more likely an effect from his recent hx of Botox likely allowing decreased spasticity in plantarflexors/invertors. Able to meet 1/1 STG and 2/3 LTG.  Pt has plan in place for return to community fitness center for continued activities. Will D/C to HEP   OBJECTIVE IMPAIRMENTS Abnormal gait, decreased balance, decreased coordination, difficulty walking, decreased strength, improper body mechanics, and postural dysfunction.   ACTIVITY LIMITATIONS carrying, lifting, bending, squatting, stairs, and locomotion level  PARTICIPATION LIMITATIONS: cleaning, interpersonal relationship, community activity, and yard work  PERSONAL FACTORS Time since onset of injury/illness/exacerbation and 1 comorbidity: hx of TBI  are also affecting patient's functional outcome.   REHAB POTENTIAL: Good  CLINICAL DECISION MAKING: Evolving/moderate complexity  EVALUATION COMPLEXITY: Moderate  PLAN: PT FREQUENCY: 1x/week  PT DURATION: 12 weeks  PLANNED INTERVENTIONS: Therapeutic exercises, Therapeutic activity, Neuromuscular re-education, Balance training, Gait training, Patient/Family education, Self Care, Joint mobilization, Stair training, Vestibular training, Canalith  repositioning, Orthotic/Fit training, DME instructions, Aquatic Therapy, Electrical stimulation, Wheelchair mobility training, Spinal mobilization, Cryotherapy, Moist heat, Taping, Ionotophoresis 49m/ml Dexamethasone, and Manual therapy  PLAN FOR NEXT SESSION: Update HEP (code that is in pt's chart on previous visit has vestibular exercises, so unsure which exercises pt has at home) for progression of gym routine   8:00 AM, 06/04/22 M. KSherlyn Lees PT, DPT Physical Therapist- CBoutteOffice Number: [phone]

## 2022-06-09 ENCOUNTER — Ambulatory Visit: Payer: BC Managed Care – PPO | Admitting: Occupational Therapy

## 2022-06-09 ENCOUNTER — Ambulatory Visit: Payer: BC Managed Care – PPO

## 2022-06-09 DIAGNOSIS — M25611 Stiffness of right shoulder, not elsewhere classified: Secondary | ICD-10-CM

## 2022-06-09 DIAGNOSIS — M6281 Muscle weakness (generalized): Secondary | ICD-10-CM | POA: Diagnosis not present

## 2022-06-09 DIAGNOSIS — G8111 Spastic hemiplegia affecting right dominant side: Secondary | ICD-10-CM

## 2022-06-09 DIAGNOSIS — M25621 Stiffness of right elbow, not elsewhere classified: Secondary | ICD-10-CM

## 2022-06-09 DIAGNOSIS — R293 Abnormal posture: Secondary | ICD-10-CM

## 2022-06-09 DIAGNOSIS — M25641 Stiffness of right hand, not elsewhere classified: Secondary | ICD-10-CM

## 2022-06-09 NOTE — Therapy (Signed)
OUTPATIENT OCCUPATIONAL THERAPY  Treatment Note & Discharge  Patient Name: ALAZAR CHERIAN MRN: [id number] DOB:[birth year], 36 y.o., male Today's Date: 06/09/2022  PCP: Jonathon Bellows, [place] PROVIDER: Meredith Staggers, MD  OCCUPATIONAL THERAPY DISCHARGE SUMMARY  Visits from Start of Care: 9  Current functional level related to goals / functional outcomes: Pt demonstrating minimal progress in ROM with shoulder flexion and elbow extension, however fluctuates session to session and within session.  Pt demonstrates good awareness of exercises and engagement in HEP at home.   Remaining deficits: Tone and decreased ROM in RUE   Education / Equipment: Educated on spasticity, HEP for ROM/stretch   Patient agrees to discharge. Patient goals were not met. Patient is being discharged due to maximized rehab potential. .      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[place])    Chronic hepatitis B ([place])    Fixed pupils    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[place])    Hypertension    Intracranial injury of other and unspecified nature, without mention of open intracranial wound, unspecified state of consciousness    Intracranial shunt    Paralysis ([place])    Spastic hemiplegia affecting dominant side ([place])    Stroke ([place])    Traumatic brain injury ([place])    Trouble swallowing    Visual disturbance    Weakness    Past Surgical History:  Procedure Laterality Date    cramiectomy     CRANIOPLASTY     REMOVAL OF GASTROSTOMY TUBE     VENTRICULOPERITONEAL SHUNT     Patient Active Problem List   Diagnosis Date Noted   SIRS due to infectious process with acute organ dysfunction ([place]) 03/14/2021   SIRS (systemic inflammatory response syndrome) ([place]) 03/12/2021   Syncope 03/12/2021   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[place]) 03/12/2021   Hypokalemia 03/12/2021   Lactic acidosis 03/12/2021   Mixed hyperlipidemia 04/25/2018   Seizure disorder ([place]) 04/11/2018   Mechanical low back pain 05/12/2017   Dysarthria 12/20/2015   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/20/2012   Deafness, sensorineural 04/20/2012   Buzzing in ear 04/20/2012   Leaking percutaneous endoscopic gastrostomy (PEG) tube ([place]) 02/04/2012   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 pre-existing conscious levels, sequela ([place]) 10/12/2011   Cerebral infarct ([place]) 10/12/2011   Spastic hemiplegia of right dominant side due to noncerebrovascular etiology ([place]) 10/12/2011   Dysphagia 10/12/2011   Functioning G-tube 03/24/2011   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   Hemiplegia of dominant side ([place]) 07/28/2010   CEREBRAL HEMORRHAGE 07/28/2010   PERSONAL HISTORY OF TRAUMATIC BRAIN INJURY 07/02/2010   ALLERGIC RHINITIS CAUSE UNSPECIFIED 10/25/2008   GASTROENTERITIS 08/09/2008   CONTACT DERMATITIS&OTHER ECZEMA DUE DETERGENTS 08/18/2007   HEPATITIS  B, CHRONIC 06/28/2007   Chronic viral hepatitis B without delta-agent ([place]) 06/28/2007    ONSET DATE: 03/04/22 (referral date)  REFERRING DIAG: G81.11 (ICD-10-CM) - Spastic hemiplegia of right dominant side due to noncerebrovascular etiology   THERAPY DIAG:  Spastic hemiplegia affecting right dominant side, unspecified etiology ([place])  Stiffness of right shoulder, not elsewhere classified  Stiffness of right elbow, not elsewhere classified  Stiffness of right hand, not elsewhere  classified  Abnormal posture  Rationale for Evaluation and Treatment Rehabilitation  SUBJECTIVE:   SUBJECTIVE STATEMENT: Pt's father reports that they almost missed the appointment, but [facility] remember that he had therapy today.  Pt accompanied by: self and family member (dad)  PERTINENT HISTORY: 1. History of traumatic brain injury with hydrocephalus, meningitis,   and right pontine stroke (2011) 2. Spastic tetraplegia, dominant side more affected 3. severe oropharyngeal dysphagia--improved--tolerating diet well.  Continues on thickened liquids 4. Absence seizures 5. Mechanical Low back pain: due to posture/altered gait patterns--improved 6. Left great toe infection--podiatry f/u    PRECAUTIONS: Fall and Other: h/o seizures  WEIGHT BEARING RESTRICTIONS No  PAIN:  Are you having pain? No  FALLS: Has patient fallen in last 6 months? Yes. Number of falls 4-5  LIVING ENVIRONMENT: Lives with: lives with their family Lives in: House/apartment Stairs: Yes: Internal: full flight to 2nd floor, but bedroom and bathroom on main floor and does not need to go up steps; can reach both and External: 3 steps; can reach both Has following equipment at home: shower chair and Grab bars  PLOF: Needs assistance with ADLs  PATIENT GOALS "arm" stronger and flexible  OBJECTIVE:   HAND DOMINANCE: Right  ADLs: Mobility: Mod I around the house, hand held assist when in the community Grooming: Mod I  UB Dressing: Mod I LB Dressing: family assists with donning AFO and tying shoes Toileting: Mod I - utilizes bidet for hygiene Bathing: family assists with bathing, he completes ~90% Tub Shower transfers: Supervision/assist with shower transfers Equipment: Shower seat with back, Grab bars, and Walk in shower  UPPER EXTREMITY ROM     Active ROM Right eval Left eval Right 04/21/22 Right 06/09/22  Shoulder flexion 106  104 108  Shoulder abduction      Shoulder adduction      Shoulder  extension      Shoulder internal rotation      Shoulder external rotation      Elbow flexion 123  132 132  Elbow extension -34  -38 -31  Wrist flexion 12   0  Wrist extension -12   30  Wrist ulnar deviation      Wrist radial deviation      Wrist pronation      Wrist supination      (Blank rows = not tested)  Passive ROM Right eval Left eval Right 04/21/22 Right 06/09/22  Shoulder flexion 110  115 115  Shoulder abduction      Shoulder adduction      Shoulder extension      Shoulder internal rotation      Shoulder external rotation      Elbow flexion 143  145   Elbow extension -32  -34 -26  Wrist flexion 40   55  Wrist extension 34   55  Wrist ulnar deviation      Wrist radial deviation      Wrist pronation      Wrist supination      (Blank rows = not tested)  HAND  FUNCTION: Pt is able to manually open hand with great effort  MUSCLE TONE: RUE: Moderate, Hypertonic, and Modifed Ashworth Scale 3 = Considerable increase in muscle tone, passive movement difficult  COGNITION: Overall cognitive status: Impaired, History of cognitive impairments - at baseline, and father present providing majority of information --------------------------------------------------------------------------------------------------------------------------------------------------------  TODAY'S TREATMENT:  06/09/22 HEP: reviewed HEP with focus on shoulder flexion and elbow extension.  Pt most responsive to pendulum style exercises to decrease effects of gravity on stretch.  Self-ROM exercises with focus on shoulder flexion/extension, internal/external rotation, and horizontal abduction/adduction, and elbow extension/flexion.  OT instructed in shoulder exercises in supine and sidelying to offer alternative positioning to attempt to facilitate increased ROM. Took measurements and discussed min progress from evaluation.  Reiterated importance of continued botox and use of stretching both PROM with caregiver  assist and Self-ROM.   05/26/22 NMR/stretch: PROM with focus on supination/pronation, elbow flexion/extension, wrist flexion/extension.  OT providing manual facilitation/stretch in all ranges with pt unable to maintain mobility post stretch due to tone. AROM: engaged in elbow flexion/extension with cues to reach towards target to facilitate increased stretch/ROM. OT providing tactile cues at trunk to maintain upright sitting posture, as pt with tendency to lean into movement to compensate for decreased ROM.   AAROM: forward reaching and reaching towards floor with LUE over RUE to further facilitate increased stretch and ROM.  Pt demonstrating improved tolerance to stretch with use of gravity to aid in ROM.  Incorporated pendulum swing with LUE providing support to maintain elbow extension. Saebo glide: Engaged in shoulder flexion and elbow extension with therapist providing hand over hand assist to maintain grasp on handle.  Pt able to elicit increased shoulder flexion and elbow extension with assist and during exercises, however with minimal carryover after exercise.   05/19/22 NMR/stretch: WB through RUE on half ball to accommodate for decreased hand opening and wrist extension. OT facilitating increased shoulder and elbow ROM with reaching outside BOS with LUE while maintaining WB through RUE.  OT with hand placement over R hand to obtain and maintain positioning and providing tactile cues for increased stretch. Therapeutic exercises: OT reiterated HEP with pt demonstrating each exercise. Pt requiring mod demonstration cues for proper technique with shoulder circles with dowel with focus on completing both in clockwise and counter clockwise direction as well as when completing shoulder external rotation with cane and hand in neutral to allow for increased stretch. Palpated elbow and surrounding area s/p fall 2 weeks ago, elbow boggy but no c/o pain to palpation or with movements.    PATIENT  EDUCATION: Education details: Reiterated HEP for shoulder, elbow, and forearm stretches Person educated: Patient and Parent Education method: Explanation, Demonstration, Corporate treasurer cues, and Handouts Education comprehension: needs further education   HOME EXERCISE PROGRAM: Access Code: [email or web address] URL: [email or web address] Date: 06/09/2022 Prepared by: [facility]  Exercises - Overhead Wall Slide with Towel  - 1 x daily - 2 sets - 10 reps - Seated shoulder and elbow stretch  - 1 x daily - 2 sets - 10 reps - Seated Shoulder Flexion Extension AAROM with Dowel into Wall  - 1 x daily - 2 sets - 10 reps - Seated Shoulder Circles AAROM with Dowel into Wall  - 1 x daily - 2 sets - 10 reps - Seated Shoulder External Rotation AAROM with Cane and Hand in Neutral  - 1 x daily - 2 sets - 10 reps - Seated Chest Press with Bar  -  1 x daily - 2 sets - 10 reps - Seated Shoulder Flexion Towel Slide at Table Top  - 1 x daily - 2 sets - 10 reps - Seated Elbow Extension and Shoulder External Rotation AAROM at Table with Towel  - 1 x daily - 2 sets - 10 reps - Seated Forearm Supination PROM with Caregiver  - 1 x daily - 2 sets - 10 reps - Seated Wrist Flexion Extension PROM  - 1 x daily - 2 sets - 10 reps - Supine Shoulder Flexion Stretch with hand supporting at wrist  - 1 x daily - 2 sets - 10 reps - Supine Shoulder Abduction with Elbow Bent PROM with Caregiver   - 1 x daily - 2 sets - 10 reps    GOALS: Goals reviewed with patient? Yes  SHORT TERM GOALS: Target date: 04/24/22  Pt and caregiver will be independent with HEP for shoulder and elbow ROM. Baseline: Pt not currently engaging in any ROM/stretching program Goal status: MET - 04/21/22  2.  Pt will demonstrate improved active elbow extension to -30* to increase functional use Baseline: -34* Goal status: NOT MET - 04/21/22  3.  Pt will demonstrate improved shoulder flexion to 110* to increase  functional use Baseline: 106* Goal status: NOT MET - 04/21/22   LONG TERM GOALS: Target date: 06/19/22  Pt will tolerate advanced HEP/stretching program for RUE. Baseline: No HEP at this time Goal status: MET  2.  Pt will demonstrate improved active elbow extension to -20* to increase functional use Baseline: -34* Goal status: NOT MET  3.  Pt will demonstrate improved shoulder flexion to 115* to allow for improved ease with ADLs. Baseline: 106* Goal status: NOT MET  4.  Pt will be independent with splint wear and care PRN Baseline: splinting needs TBD Goal status: N/A  ASSESSMENT:  CLINICAL IMPRESSION: Treatment session with focus on review of HEP with focus on shoulder flexion and abduction and elbow extension. Pt able to elicit increased shoulder flexion and elbow extension with assist and during exercises, however with minimal carryover after exercise.  OT incorporated supine and sidelying exercises with focus on quality of movement in gravity minimized position.  Pt and father understand recommendation to continue completing HEP at home and continue with botox as recommended by PM&R for ROM in RUE.  PERFORMANCE DEFICITS in functional skills including ADLs, IADLs, coordination, dexterity, tone, ROM, strength, pain, flexibility, FMC, GMC, decreased knowledge of precautions, and UE functional use, cognitive skills including learn, memory, and safety awareness.  IMPAIRMENTS are limiting patient from ADLs and IADLs.   COMORBIDITIES may have co-morbidities  that affects occupational performance. Patient will benefit from skilled OT to address above impairments and improve overall function.  MODIFICATION OR ASSISTANCE TO COMPLETE EVALUATION: Min-Moderate modification of tasks or assist with assess necessary to complete an evaluation.  OT OCCUPATIONAL PROFILE AND HISTORY: Detailed assessment: Review of records and additional review of physical, cognitive, psychosocial history related to  current functional performance.  CLINICAL DECISION MAKING: Moderate - several treatment options, min-mod task modification necessary  REHAB POTENTIAL: Fair ongoing spasticity  EVALUATION COMPLEXITY: Moderate    PLAN: OT FREQUENCY: 1x/week  OT DURATION: 12 weeks (Plan for 8 visits over 12 weeks, but asking 12 weeks to allow for time to benefit upcoming botox injections)  PLANNED INTERVENTIONS: self care/ADL training, therapeutic exercise, therapeutic activity, neuromuscular re-education, manual therapy, passive range of motion, aquatic therapy, splinting, moist heat, cryotherapy, and patient/family education  RECOMMENDED OTHER SERVICES: may  benefit from aquatic therapy OT  CONSULTED AND AGREED WITH PLAN OF CARE: Patient and family member/caregiver  PLAN FOR NEXT SESSION: D/C   Simonne Come, OTR/L 06/09/2022, 9:43 AM

## 2022-07-08 ENCOUNTER — Encounter: Payer: BC Managed Care – PPO | Admitting: Physical Medicine & Rehabilitation

## 2022-08-12 ENCOUNTER — Encounter
Payer: BC Managed Care – PPO | Attending: Physical Medicine & Rehabilitation | Admitting: Physical Medicine & Rehabilitation

## 2022-08-12 ENCOUNTER — Encounter: Payer: Self-pay | Admitting: Physical Medicine & Rehabilitation

## 2022-08-12 VITALS — BP 106/72 | HR 78 | Ht 67.0 in | Wt 166.0 lb

## 2022-08-12 DIAGNOSIS — G8111 Spastic hemiplegia affecting right dominant side: Secondary | ICD-10-CM | POA: Insufficient documentation

## 2022-08-12 NOTE — Progress Notes (Signed)
Subjective:    Patient ID: Paul Kane, male    DOB: [birth year], 37 y.o.   MRN: [id number]  HPI Paul Kane is back regarding his spastic hemiparesis. We performed xeomin injections back in September and mother noticed a big difference in his right foot placement, fit in AFO, etc. Also his right elbow relaxed. Unfortunately this Fall he had a few falls. He landed on his right side, partictularly his elbow and developed a bursitis. The elbow is improving but is still tender.   Mom is interested in more therapy to further address his gait and balance. He has been doing well from a cognitive and emotional standpoint. He is engaged with his family. He tries to stay fairly active.   Pain Inventory Average Pain 10 Pain Right Now 10 My pain is aching  LOCATION OF PAIN  Right elbow  BOWEL Number of stools per week: 3-4 Oral laxative use No    BLADDER Normal    Mobility walk with assistance how many minutes can you walk? Unknown - Having balance problems ability to climb steps?  yes do you drive?  no Do you have any goals in this area?  yes  Function I need assistance with the following:  dressing, bathing, meal prep, household duties, and shopping Do you have any goals in this area?  yes  Neuro/Psych weakness numbness trouble walking spasms  Prior Studies Any changes since last visit?  yes x-rays in the [place]  Physicians involved in your care Any changes since last visit?  no   Family History  Problem Relation Age of Onset   Diabetes Other    Hyperlipidemia Other    Hypertension Other    Social History   Socioeconomic History   Marital status: Single    Spouse name: Not on file   Number of children: Not on file   Years of education: Not on file   Highest education level: Not on file  Occupational History   Not on file  Tobacco Use   Smoking status: Never   Smokeless tobacco: Never  Vaping Use   Vaping Use: Never used  Substance and Sexual Activity    Alcohol use: No   Drug use: No   Sexual activity: Never  Other Topics Concern   Not on file  Social History Narrative   Not on file   Social Determinants of Health   Financial Resource Strain: Not on file  Food Insecurity: Not on file  Transportation Needs: Not on file  Physical Activity: Not on file  Stress: Not on file  Social Connections: Not on file   Past Surgical History:  Procedure Laterality Date   cramiectomy     CRANIOPLASTY     REMOVAL OF GASTROSTOMY TUBE     VENTRICULOPERITONEAL SHUNT     Past Medical History:  Diagnosis Date   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[place])    Chronic hepatitis B ([place])    Fixed pupils    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[place])    Hypertension    Intracranial injury of other and unspecified nature, without mention of open intracranial wound, unspecified state of consciousness    Intracranial shunt    Paralysis ([facility])    Spastic hemiplegia affecting dominant side ([facility])    Stroke ([place])    Traumatic brain injury ([place])    Trouble swallowing    Visual disturbance    Weakness    BP 106/72   Pulse 78   Ht 5\' 7"  (1.702  m)   Wt 166 lb (75.3 kg)   SpO2 96%   BMI 26.00 kg/m   Opioid Risk Score:   Fall Risk Score:  `1  Depression screen PHQ 2/9     08/12/2022    3:13 PM 03/04/2022    3:40 PM 10/01/2021    1:09 PM 06/04/2021    1:32 PM 03/19/2021   11:32 AM 10/02/2020   10:31 AM 06/05/2020   10:57 AM  Depression screen PHQ 2/9  Decreased Interest 1 0 0 0 1 0 0  Down, Depressed, Hopeless 1 0 0 0 1 0 0  PHQ - 2 Score 2 0 0 0 2 0 0    Review of Systems  Musculoskeletal:  Positive for gait problem.       Having balance issue spasms  Skin:  Positive for wound.       Right elbow pain  Neurological:  Positive for weakness and numbness.  All other systems reviewed and are negative.      Objective:   Physical Exam  Constitutional: No distress . Vital signs reviewed. HEENT: NCAT, EOMI, oral membranes moist Neck:  supple Cardiovascular: RRR without murmur. No JVD    Respiratory/Chest: CTA Bilaterally without wheezes or rales. Normal effort    GI/Abdomen: BS +, non-tender, non-distended Ext: no clubbing, cyanosis, or edema Psych: pleasant and cooperative  Skin: intact Neuro: Patient is alert and oriented.  Speech remains very dysarthric.  Has reasonable insight and awareness.  Left-sided motor is 4 out of 5 proximal distal.  Right upper extremity is 2-3 out of 5 and limited by tone especially in his elbow and wrist which is 2 out of 4.  Right lower extremity is 4 out of 5 proximal to 1-2 out of 5 distally with improved equinovarus deformity right ankle.  He still tends to roll the ankle to an extent without the brace.  Grade is tone at the ankle is 2  out of 4.    Musculoskeletal: Mild pain at the right elbow with improvement of effusion already.  Minimal low back pain notable.           1. History of traumatic brain injury with hydrocephalus, meningitis,   and right pontine stroke.   2. Spastic tetraplegia, dominant side more affected 3. severe oropharyngeal dysphagia--improved--tolerating diet well.  Continues on thickened liquids 4. Absence seizures 5. Mechanical Low back pain: due to posture/altered gait patterns--improved 6. Left great toe infection--podiatry f/u       PLAN:   1. Right elbow pad to protect olecranon bursa  2.  Repeat Xeomin injections 500 units to the right biceps , ?lumbricals. as well as right gastrocs/tibailias posterior and 3.  Vimpat and keppra for seizure prophylaxis  4. tylenol or voltaren for pain     20  minutes of face to face patient care time were spent during this visit. All questions were encouraged and answered.  Follow up with me in about a month

## 2022-08-12 NOTE — Patient Instructions (Addendum)
ALWAYS FEEL FREE TO CALL OUR OFFICE WITH ANY PROBLEMS OR QUESTIONS ([phone])  **PLEASE NOTE** ALL MEDICATION REFILL REQUESTS (INCLUDING CONTROLLED SUBSTANCES) NEED TO BE MADE AT LEAST 7 DAYS PRIOR TO REFILL BEING DUE. ANY REFILL REQUESTS INSIDE THAT TIME FRAME MAY RESULT IN DELAYS IN RECEIVING YOUR PRESCRIPTION.    RIGHT ELBOW PAD!

## 2022-09-03 ENCOUNTER — Emergency Department (HOSPITAL_BASED_OUTPATIENT_CLINIC_OR_DEPARTMENT_OTHER)
Admission: EM | Admit: 2022-09-03 | Discharge: 2022-09-04 | Disposition: A | Discharge status: Home / Self Care | Payer: BC Managed Care – PPO | Attending: Emergency Medicine | Admitting: Emergency Medicine

## 2022-09-03 ENCOUNTER — Encounter (HOSPITAL_BASED_OUTPATIENT_CLINIC_OR_DEPARTMENT_OTHER): Payer: Self-pay

## 2022-09-03 ENCOUNTER — Other Ambulatory Visit: Payer: Self-pay

## 2022-09-03 DIAGNOSIS — W19XXXA Unspecified fall, initial encounter: Secondary | ICD-10-CM

## 2022-09-03 DIAGNOSIS — S0993XA Unspecified injury of face, initial encounter: Secondary | ICD-10-CM | POA: Diagnosis present

## 2022-09-03 DIAGNOSIS — W01198A Fall on same level from slipping, tripping and stumbling with subsequent striking against other object, initial encounter: Secondary | ICD-10-CM | POA: Insufficient documentation

## 2022-09-03 DIAGNOSIS — Y9301 Activity, walking, marching and hiking: Secondary | ICD-10-CM | POA: Insufficient documentation

## 2022-09-03 DIAGNOSIS — S01112A Laceration without foreign body of left eyelid and periocular area, initial encounter: Secondary | ICD-10-CM | POA: Insufficient documentation

## 2022-09-03 DIAGNOSIS — S0181XA Laceration without foreign body of other part of head, initial encounter: Secondary | ICD-10-CM | POA: Diagnosis not present

## 2022-09-03 DIAGNOSIS — Z23 Encounter for immunization: Secondary | ICD-10-CM | POA: Diagnosis not present

## 2022-09-03 DIAGNOSIS — R531 Weakness: Secondary | ICD-10-CM | POA: Diagnosis not present

## 2022-09-03 MED ORDER — LIDOCAINE-EPINEPHRINE (PF) 2 %-1:200000 IJ SOLN
20.0000 mL | Freq: Once | INTRAMUSCULAR | Status: DC
Start: 2022-09-03 — End: 2022-09-04
  Filled 2022-09-03: qty 20

## 2022-09-03 MED ORDER — LIDOCAINE-EPINEPHRINE-TETRACAINE (LET) TOPICAL GEL
3.0000 mL | Freq: Once | TOPICAL | Status: DC
Start: 2022-09-03 — End: 2022-09-04
  Filled 2022-09-03: qty 3

## 2022-09-03 MED ORDER — TETANUS-DIPHTH-ACELL PERTUSSIS 5-2.5-18.5 LF-MCG/0.5 IM SUSY
0.5000 mL | PREFILLED_SYRINGE | Freq: Once | INTRAMUSCULAR | Status: AC
Start: 2022-09-03 — End: 2022-09-04
  Administered 2022-09-04: 0.5 mL via INTRAMUSCULAR
  Filled 2022-09-03: qty 0.5

## 2022-09-03 NOTE — ED Triage Notes (Addendum)
Patient here POV from Home.  Endorses Losing his Balance and falling to the left hitting his head onto a Railing. No LOC. No Anticoagulants. This occurred 30 Minutes ago.   3 cm laceration above Left Eye. Eye not affected. Pupils equal and reactive. History of TBI with Right Side Paralyzed.  NAD Noted during Triage. A&Ox4. GCS 15. Ambulatory.

## 2022-09-03 NOTE — ED Provider Notes (Signed)
[facility] Provider Note   CSN: [id number] Arrival date & time: 09/03/22  2238     History  Chief Complaint  Patient presents with   Nance Pear is a 37 y.o. male.  Patient with previous TBI and right-sided weakness.  Has gait instability at baseline.  Family reports he lost his balance while walking and struck his forehead against the wooden railing.  Did not fall down stairs.  Did not lose consciousness.  Sustained laceration to left forehead.  He is behaving normally without vomiting.  Right-sided weakness at baseline which is unchanged.  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  No blood thinner use.  No new weakness, numbness or tingling.  No vision changes.  Behaving normally per family.  They state he has gait instability and frequent falls.  He does not recall falling.  Denies any preceding dizziness or lightheadedness.  The history is provided by the patient and a relative.  Fall Associated symptoms include headaches. Pertinent negatives include no chest pain, no abdominal pain and no shortness of breath.       Home Medications Prior to Admission medications   Medication Sig Start Date End Date Taking? Authorizing Provider  Cholecalciferol (VITAMIN D-3) 25 MCG (1000 UT) CAPS Take 1,000-2,000 Units by mouth daily.    [provider]  [employer] INSTANT FOOD THICKENER POWD TAKE AS DIRECTED Patient taking differently: as directed. 10/31/18   Meredith Staggers, MD  lacosamide (VIMPAT) 10 MG/ML oral solution 2 mLs (20 mg total) 2 times daily. 09/28/15   [provider]  levETIRAcetam (KEPPRA) 100 MG/ML solution Take by mouth. 02/05/22   [provider]  loratadine (CLARITIN) 10 MG tablet Take by mouth.    [provider]  Misc. Devices (RECONSTITUBE) MISC 1 each by Misc.(Non-Drug; Combo Route) route every 30 days. 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 Patient not taking: Reported on 08/12/2022    [provider]      Allergies    Patient has no known allergies.    Review of Systems   Review of Systems  Constitutional:  Negative for activity change, appetite change and fever.  HENT:  Negative for congestion and rhinorrhea.   Respiratory:  Negative for cough and shortness of breath.   Cardiovascular:  Negative for chest pain.  Gastrointestinal:  Negative for abdominal pain, nausea and vomiting.  Genitourinary:  Negative for dysuria and hematuria.  Musculoskeletal:  Negative for arthralgias and myalgias.  Skin:  Negative for rash.  Neurological:  Positive for headaches. Negative for dizziness and light-headedness.   all other systems are negative except as noted in the HPI and PMH.    Physical Exam Updated Vital Signs BP 113/82 (BP Location: Left Arm)   Pulse 68   Temp 97.6 F (36.4 C) (Oral)   Resp 17   Ht 5' 7"$  (1.702 m)   Wt 75.3 kg   SpO2 100%   BMI 26.00 kg/m  Physical Exam Vitals and nursing note reviewed.  Constitutional:      General: He is not in acute distress.    Appearance: He is well-developed.  HENT:     Head: Normocephalic.     Comments: 3  cm laceration to forehead, not involving lid    Mouth/Throat:     Pharynx: No oropharyngeal exudate.  Eyes:     Conjunctiva/sclera: Conjunctivae normal.     Pupils: Pupils are equal, round, and reactive to light.  Neck:     Comments: No meningismus. Cardiovascular:     Rate and Rhythm: Normal rate and regular rhythm.     Heart sounds: Normal heart sounds. No murmur heard. Pulmonary:     Effort: Pulmonary effort is normal. No respiratory distress.     Breath sounds: Normal breath sounds.  Chest:     Chest wall: No tenderness.  Abdominal:      Palpations: Abdomen is soft.     Tenderness: There is no abdominal tenderness. There is no guarding or rebound.  Musculoskeletal:        General: No tenderness. Normal range of motion.     Cervical back: Normal range of motion and neck supple.  Skin:    General: Skin is warm.  Neurological:     Mental Status: He is alert and oriented to person, place, and time.     Cranial Nerves: No cranial nerve deficit.     Motor: No abnormal muscle tone.     Coordination: Coordination normal.     Comments: R sided weakness at baseline.  No facial droop 5/5 strength on L.  L eye does not cross midline  Psychiatric:        Behavior: Behavior normal.     ED Results / Procedures / Treatments   Labs (all labs ordered are listed, but only abnormal results are displayed) Labs Reviewed - No data to display  EKG None  Radiology CT Head Wo Contrast  Result Date: 09/04/2022 CLINICAL DATA:  Fall. Head trauma, moderate-severe; Neck trauma, dangerous injury mechanism (Age 23-64y) EXAM: CT HEAD WITHOUT CONTRAST CT CERVICAL SPINE WITHOUT CONTRAST TECHNIQUE: Multidetector CT imaging of the head and cervical spine was performed following the standard protocol without intravenous contrast. Multiplanar CT image reconstructions of the cervical spine were also generated. RADIATION DOSE REDUCTION: This exam was performed according to the departmental dose-optimization program which includes automated exposure control, adjustment of the mA and/or kV according to patient size and/or use of iterative reconstruction technique. COMPARISON:  CT head 03/12/2021, CT cervical spine 11/23/2009 FINDINGS: CT HEAD FINDINGS Brain: Status post left frontotemporal craniotomy with extensive encephalomalacia involving the subjacent left frontal and temporal lobes, stable since prior examination. Stable encephalomalacia within the inferior right frontal lobe. Stable ex vacuo dilation of the anterior horn of the right lateral ventricle and  left lateral ventricle. Right frontal ventriculostomy with its tip position within the third ventricle is unchanged. No evidence of acute intracranial hemorrhage or infarct. No abnormal mass effect or midline shift. No abnormal intra or extra-axial mass lesion. Cerebellum is unremarkable. Vascular: No hyperdense vessel or unexpected calcification. Skull: No acute fracture Sinuses/Orbits: No acute finding. Other: Mastoid air cells and middle ear cavities are clear CT CERVICAL SPINE FINDINGS Alignment: Normal. Skull base and vertebrae: No acute fracture. No primary bone lesion or focal pathologic process. Soft tissues and spinal canal: No prevertebral fluid or swelling. No visible canal hematoma. Disc levels: Intervertebral disc heights are preserved. Minimal endplate remodeling at D34-534 in keeping with changes of mild degenerative disc disease. Prevertebral soft tissues are not thickened on sagittal reformats. Spinal canal is widely patent. No significant neuroforaminal narrowing. Upper chest: Negative. Other: Ventriculoperitoneal shunt catheter tubing noted within the right cervical soft tissues. IMPRESSION: 1. No  acute intracranial abnormality. No calvarial fracture. 2. 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 Stable encephalomalacia within the inferior right frontal lobe. 3. Stable right frontal ventriculostomy with its tip position within the third ventricle. Stable ex vacuo dilation of the lateral ventricles. 4. No acute fracture or listhesis of the cervical spine. Electronically Signed   By: Fidela Salisbury M.D.   On: 09/04/2022 01:14   CT Cervical Spine Wo Contrast  Result Date: 09/04/2022 CLINICAL DATA:  Fall. Head trauma, moderate-severe; Neck trauma, dangerous injury mechanism (Age 31-64y) EXAM: CT HEAD WITHOUT CONTRAST CT CERVICAL SPINE WITHOUT CONTRAST TECHNIQUE: Multidetector CT imaging of the head and cervical spine  was performed following the standard protocol without intravenous contrast. Multiplanar CT image reconstructions of the cervical spine were also generated. RADIATION DOSE REDUCTION: This exam was performed according to the departmental dose-optimization program which includes automated exposure control, adjustment of the mA and/or kV according to patient size and/or use of iterative reconstruction technique. COMPARISON:  CT head 03/12/2021, CT cervical spine 11/23/2009 FINDINGS: CT HEAD FINDINGS Brain: Status post left frontotemporal craniotomy with extensive encephalomalacia involving the subjacent left frontal and temporal lobes, stable since prior examination. Stable encephalomalacia within the inferior right frontal lobe. Stable ex vacuo dilation of the anterior horn of the right lateral ventricle and left lateral ventricle. Right frontal ventriculostomy with its tip position within the third ventricle is unchanged. No evidence of acute intracranial hemorrhage or infarct. No abnormal mass effect or midline shift. No abnormal intra or extra-axial mass lesion. Cerebellum is unremarkable. Vascular: No hyperdense vessel or unexpected calcification. Skull: No acute fracture Sinuses/Orbits: No acute finding. Other: Mastoid air cells and middle ear cavities are clear CT CERVICAL SPINE FINDINGS Alignment: Normal. Skull base and vertebrae: No acute fracture. No primary bone lesion or focal pathologic process. Soft tissues and spinal canal: No prevertebral fluid or swelling. No visible canal hematoma. Disc levels: Intervertebral disc heights are preserved. Minimal endplate remodeling at D34-534 in keeping with changes of mild degenerative disc disease. Prevertebral soft tissues are not thickened on sagittal reformats. Spinal canal is widely patent. No significant neuroforaminal narrowing. Upper chest: Negative. Other: Ventriculoperitoneal shunt catheter tubing noted within the right cervical soft tissues. IMPRESSION: 1. No  acute intracranial abnormality. No calvarial fracture. 2. 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 Stable encephalomalacia within the inferior right frontal lobe. 3. Stable right frontal ventriculostomy with its tip position within the third ventricle. Stable ex vacuo dilation of the lateral ventricles. 4. No acute fracture or listhesis of the cervical spine. Electronically Signed   By: Fidela Salisbury M.D.   On: 09/04/2022 01:14    Procedures .Marland KitchenLaceration Repair  Date/Time: 09/04/2022 12:32 AM  Performed by: Ezequiel Essex, MD Authorized by: Ezequiel Essex, MD   Consent:    Consent obtained:  Verbal   Consent given by:  Patient   Risks, benefits, and alternatives were discussed: yes     Risks discussed:  Infection, need for additional repair, nerve damage, poor wound healing, poor cosmetic result, retained foreign body, pain and tendon damage Universal protocol:    Procedure explained and questions answered to patient or proxy's satisfaction: yes     Relevant documents present and verified: yes     Test results available: yes     Imaging studies available: yes     Immediately prior to procedure, a time out was called: yes  Patient identity confirmed:  Verbally with patient Anesthesia:    Anesthesia method:  Local infiltration   Local anesthetic:  Lidocaine 1% WITH epi Laceration details:    Location:  Face   Face location:  L eyebrow   Length (cm):  3.5 Pre-procedure details:    Preparation:  Patient was prepped and draped in usual sterile fashion Exploration:    Limited defect created (wound extended): no     Hemostasis achieved with:  Epinephrine and direct pressure   Imaging outcome: foreign body not noted     Wound exploration: wound explored through full range of motion and entire depth of wound visualized     Wound extent: no underlying fracture and no vascular damage      Contaminated: no   Treatment:    Area cleansed with:  Povidone-iodine   Amount of cleaning:  Standard   Irrigation solution:  Sterile saline   Irrigation method:  Pressure wash   Visualized foreign bodies/material removed: no     Debridement:  None   Undermining:  None Skin repair:    Repair method:  Sutures   Suture size:  5-0   Suture material:  Prolene   Suture technique:  Simple interrupted   Number of sutures:  7 Approximation:    Approximation:  Close Repair type:    Repair type:  Intermediate Post-procedure details:    Dressing:  Antibiotic ointment and adhesive bandage   Procedure completion:  Tolerated well, no immediate complications     Medications Ordered in ED Medications  lidocaine-EPINEPHrine (XYLOCAINE W/EPI) 2 %-1:200000 (PF) injection 20 mL (has no administration in time range)  lidocaine-EPINEPHrine-tetracaine (LET) topical gel (has no administration in time range)  Tdap (BOOSTRIX) injection 0.5 mL (has no administration in time range)    ED Course/ Medical Decision Making/ A&P                             Medical Decision Making Amount and/or Complexity of Data Reviewed Labs: ordered. Decision-making details documented in ED Course. Radiology: ordered and independent interpretation performed. Decision-making details documented in ED Course. ECG/medicine tests: ordered and independent interpretation performed. Decision-making details documented in ED Course.  Risk OTC drugs. Prescription drug management.  Fall with forehead laceration.  No loss of consciousness.  Right-sided weakness at baseline is unchanged.  CT shows no acute traumatic injury.  VP shunt is stable.  No new traumatic findings on CT scan.  Results reviewed and interpreted by me. Stable areas of encephalomalacia  Laceration repaired as above.  Tetanus updated.  Patient at baseline according to family in the room  Discussed wound care instructions as well as suture removal by PCP  in 5 to 7 days.  Keep wound clean and dry for 24 hours.  Bacitracin applied.  Patient tolerating p.o.  At baseline per family in the room.  They are anxious for discharge.  Return precautions given.        Final Clinical Impression(s) / ED Diagnoses Final diagnoses:  Fall, initial encounter  Laceration of forehead, initial encounter    Rx / DC Orders ED Discharge Orders     None         Rayaan Lorah, Annie Main, MD 09/04/22 0147

## 2022-09-04 ENCOUNTER — Emergency Department (HOSPITAL_BASED_OUTPATIENT_CLINIC_OR_DEPARTMENT_OTHER): Payer: BC Managed Care – PPO

## 2022-09-04 DIAGNOSIS — S01112A Laceration without foreign body of left eyelid and periocular area, initial encounter: Secondary | ICD-10-CM | POA: Diagnosis not present

## 2022-09-04 MED ORDER — BACITRACIN ZINC 500 UNIT/GM EX OINT
TOPICAL_OINTMENT | Freq: Once | CUTANEOUS | Status: AC
  Filled 2022-09-04: qty 28.35

## 2022-09-04 NOTE — Discharge Instructions (Signed)
Keep Wound clean and dry.  Apply bacitracin once a day.  Follow-up with your primary doctor for suture removal in 5 to 7 days.  CT scan today is stable and shows no change in VP shunt.  Return to the ED with worsening headache, confusion, behavior change or any other concerns.

## 2022-09-23 ENCOUNTER — Ambulatory Visit: Payer: BC Managed Care – PPO | Admitting: Physical Medicine & Rehabilitation

## 2022-10-28 ENCOUNTER — Encounter
Payer: BC Managed Care – PPO | Attending: Physical Medicine & Rehabilitation | Admitting: Physical Medicine & Rehabilitation

## 2022-10-28 ENCOUNTER — Encounter: Payer: Self-pay | Admitting: Physical Medicine & Rehabilitation

## 2022-10-28 VITALS — BP 107/72 | HR 76 | Temp 97.7°F | Ht 67.0 in | Wt 170.0 lb

## 2022-10-28 DIAGNOSIS — G8111 Spastic hemiplegia affecting right dominant side: Secondary | ICD-10-CM | POA: Diagnosis not present

## 2022-10-28 MED ORDER — INCOBOTULINUMTOXINA 100 UNITS IM SOLR
500.0000 [IU] | Freq: Once | INTRAMUSCULAR | Status: AC
  Administered 2022-10-28: 500 [IU] via INTRAMUSCULAR

## 2022-10-28 NOTE — Progress Notes (Signed)
Xeomin Injection for spasticity using needle EMG guidance Indication: No diagnosis found.   Dilution: 100 Units/ml        Total Units Injected: 500 Indication: Severe spasticity which interferes with ADL,mobility and/or  hygiene and is unresponsive to medication management and other conservative care Informed consent was obtained after describing risks and benefits of the procedure with the patient. This includes bleeding, bruising, infection, excessive weakness, or medication side effects. A REMS form is on file and signed. 4 Right  Needle: 82mm injectable monopolar needle electrode  Number of units per muscle Pectoralis Major 0 units Pectoralis Minor 0 units Biceps 0 units Brachioradialis 0 units FCR 0 units FCU 0 units FDS 0 units FDP 0 units FPL 0 units Pronator Teres 0 units Pronator Quadratus 0 units Lumbricals 100 units Quadriceps 0 units Gastroc/soleus 200 units Hamstrings 0 units Tibialis Posterior 200 units Tibialis Anterior 0 units EHL 0 units All injections were done after obtaining appropriate EMG activity and after negative drawback for blood. The patient tolerated the procedure well. Post procedure instructions were given. No follow-ups on file.

## 2022-10-28 NOTE — Patient Instructions (Signed)
ALWAYS FEEL FREE TO CALL OUR OFFICE WITH ANY PROBLEMS OR QUESTIONS ([phone])  **PLEASE NOTE** ALL MEDICATION REFILL REQUESTS (INCLUDING CONTROLLED SUBSTANCES) NEED TO BE MADE AT LEAST 7 DAYS PRIOR TO REFILL BEING DUE. ANY REFILL REQUESTS INSIDE THAT TIME FRAME MAY RESULT IN DELAYS IN RECEIVING YOUR PRESCRIPTION.                    

## 2022-11-03 ENCOUNTER — Ambulatory Visit: Payer: BC Managed Care – PPO | Attending: Physical Medicine & Rehabilitation

## 2022-11-03 ENCOUNTER — Other Ambulatory Visit: Payer: Self-pay

## 2022-11-03 DIAGNOSIS — R2681 Unsteadiness on feet: Secondary | ICD-10-CM | POA: Insufficient documentation

## 2022-11-03 DIAGNOSIS — R293 Abnormal posture: Secondary | ICD-10-CM | POA: Diagnosis present

## 2022-11-03 DIAGNOSIS — G8111 Spastic hemiplegia affecting right dominant side: Secondary | ICD-10-CM | POA: Diagnosis not present

## 2022-11-03 DIAGNOSIS — R262 Difficulty in walking, not elsewhere classified: Secondary | ICD-10-CM | POA: Insufficient documentation

## 2022-11-03 NOTE — Therapy (Signed)
OUTPATIENT PHYSICAL THERAPY NEURO EVALUATION   Patient Name: Paul Kane MRN: [id number] DOB:08/05/1985, 37 y.o., male Today's Date: 11/03/2022   PCP: Mohammed Kindle REFERRING PROVIDER: Ranelle Oyster, MD   END OF SESSION:  PT End of Session - 11/03/22 0843     Visit Number 1    Number of Visits 8    Date for PT Re-Evaluation 01/12/23    Authorization Type BC[employer]    PT Start Time 0845    PT Stop Time 0930    PT Time Calculation (min) 45 min    Equipment Utilized During Treatment Gait belt             Past Medical History:  Diagnosis Date   Cerebral thrombosis with cerebral infarction    Chronic hepatitis B    Fixed pupils    Herniation of brain stem    Hypertension    Intracranial injury of other and unspecified nature, without mention of open intracranial wound, unspecified state of consciousness    Intracranial shunt    Paralysis    Spastic hemiplegia affecting dominant side    Stroke    Traumatic brain injury    Trouble swallowing    Visual disturbance    Weakness    Past Surgical History:  Procedure Laterality Date   cramiectomy     CRANIOPLASTY     REMOVAL OF GASTROSTOMY TUBE     VENTRICULOPERITONEAL SHUNT     Patient Active Problem List   Diagnosis Date Noted   SIRS due to infectious process with acute organ dysfunction 03/14/2021   SIRS (systemic inflammatory response syndrome) 03/12/2021   Syncope 03/12/2021   Traumatic brain injury with persistent deficit 03/12/2021   Hypokalemia 03/12/2021   Lactic acidosis 03/12/2021   Mixed hyperlipidemia 04/25/2018   Seizure disorder 04/11/2018   Mechanical low back pain 05/12/2017   Dysarthria 12/20/2015   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/20/2012   Deafness, sensorineural 04/20/2012   Buzzing in ear 04/20/2012   Leaking percutaneous endoscopic gastrostomy (PEG) tube 02/04/2012   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 pre-existing conscious  levels, sequela 10/12/2011   Cerebral infarct 10/12/2011   Spastic hemiplegia of right dominant side due to noncerebrovascular etiology 10/12/2011   Dysphagia 10/12/2011   Functioning G-tube 03/24/2011   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   Hemiplegia of dominant side (HCC) 07/28/2010   CEREBRAL HEMORRHAGE 07/28/2010   PERSONAL HISTORY OF TRAUMATIC BRAIN INJURY 07/02/2010   ALLERGIC RHINITIS CAUSE UNSPECIFIED 10/25/2008   GASTROENTERITIS 08/09/2008   CONTACT DERMATITIS&OTHER ECZEMA DUE DETERGENTS 08/18/2007   HEPATITIS B, CHRONIC 06/28/2007   Chronic viral hepatitis B without delta-agent 06/28/2007    ONSET DATE: recent hx of falls  REFERRING DIAG: G81.11 (ICD-10-CM) - Spastic hemiplegia of right dominant side due to noncerebrovascular etiology   THERAPY DIAG:  Spastic hemiplegia affecting right dominant side, unspecified etiology  Abnormal posture  Unsteadiness on feet  Difficulty in walking, not elsewhere classified  Rationale for Evaluation and Treatment: Rehabilitation  SUBJECTIVE:  SUBJECTIVE STATEMENT: Having recent falls most recently fell and struck head above left eye requiring stitches. Having worse balance lately. Denies dizziness or other concussion related symptoms Pt accompanied by: family member-father  PERTINENT HISTORY: hx of TBI  PAIN:  Are you having pain? No  PRECAUTIONS: Fall  WEIGHT BEARING RESTRICTIONS: No  FALLS: Has patient fallen in last 6 months? Yes. Number of falls multiple  LIVING ENVIRONMENT: Lives with: lives with their family Lives in: House/apartment Stairs: Yes: Internal: flight steps; on left going up and External: 3 steps; both Has following equipment at home: None  PLOF: Needs assistance with ADLs, Needs assistance with homemaking, and  Needs assistance with gait  PATIENT GOALS:   OBJECTIVE:   DIAGNOSTIC FINDINGS: n/a  COGNITION: Overall cognitive status: History of cognitive impairments - at baseline   SENSATION: Not tested  COORDINATION: Severely impaired RUE/RLE    MUSCLE TONE: RLE: Hypertonic  MUSCLE LENGTH: Full knee extension. Right plantarflexion contracture of 10-12 deg    POSTURE: weight shift left  LOWER EXTREMITY ROM:     WFL except for right ankle dorsiflexion, limited by spasticity and contracture  LOWER EXTREMITY MMT:    MMT Right Eval Left Eval  Hip flexion 3+ 5  Hip extension    Hip abduction 3+ 5  Hip adduction    Hip internal rotation    Hip external rotation    Knee flexion 3+ 5  Knee extension 4+ 5  Ankle dorsiflexion    Ankle plantarflexion    Ankle inversion    Ankle eversion    (Blank rows = not tested)  BED MOBILITY:  NT  TRANSFERS: Assistive device utilized: None  Sit to stand: Complete Independence and Modified independence Stand to sit: Complete Independence and Modified independence Chair to chair: Complete Independence and Modified independence Floor:  NT    CURB:  Level of Assistance: SBA and CGA Assistive device utilized: None Curb Comments: gait belt  STAIRS: NT  GAIT: Gait pattern: decreased stride length, circumduction- Right, and poor foot clearance- Right Distance walked:  Assistive device utilized:  right hinged ankle brace Level of assistance: Modified independence and SBA Comments:   FUNCTIONAL TESTS:  5 times sit to stand: 13 sec Timed up and go (TUG): 15 sec MCTSIB: Condition 1: Avg of 3 trials: 30 sec, Condition 2: Avg of 3 trials: 30 sec, Condition 3: Avg of 3 trials: 30 sec, Condition 4: Avg of 3 trials: 15 sec, and Total Score: 105/120 Single leg stance: 2 sec on LLE and 0 sec on RLE  Oculomotor screening: dysconjugate gaze and resting alignment. End-range nystagmus, saccades and smooth pursuits impaired. Horizontal VOR  impaired > vertical.  Present since time of TBI   PATIENT EDUCATION: Education details: assessment findings Person educated: Patient and Parent Education method: Explanation Education comprehension: needs further education  HOME EXERCISE PROGRAM: TBD  GOALS: Goals reviewed with patient? Yes  SHORT TERM GOALS: Target date: 12/01/2022    Patient will be independent in HEP to improve functional outcomes Baseline: Goal status: INITIAL  2.  Improve postural stability per mild-moderate sway condition 4 M-CTSIB x 30 sec to reduce risk for falls and reveal improved postural awareness Baseline: mod-severe x 15 sec Goal status: INITIAL    LONG TERM GOALS: Target date: 01/12/2023    Pt and parent to be indep in advanced HEP which will hopefully include relevant gym machines for strength Baseline:  Goal status: INITIAL  2.  Demo improved postural control per 120/120 sec M-CTSIB Baseline:  105/120 Goal status: INITIAL  3.  Reduce risk for falls per time 12 sec TUG test Baseline: 15 sec Goal status: INITIAL  4.  Increase right hip flexor and hamstring strength to 4/5 to improve limb advancement and stance phase stability in gait cycle Baseline: 3+/5 Goal status: INITIAL    ASSESSMENT:  CLINICAL IMPRESSION: Patient is a 37 y.o. male who was seen today for physical therapy evaluation and treatment for unsteadiness on feet and history of falling.  Demonstrates exacerbation of previous issues of TBI and presents with greater risk for falls per outcome measures and has had limited physical activity since recent fall due to striking head resulting in injury and prolonged rest.  Pt would benefit from continued PT services to train/instruct in intervention strategies and improve progressive strength program to facilitate return to fitness facility environment with caregiver assistance/supervision to promote long-term improvement/maintenance   OBJECTIVE IMPAIRMENTS: Abnormal gait,  decreased activity tolerance, decreased balance, decreased endurance, difficulty walking, decreased strength, impaired tone, and impaired vision/preception.   ACTIVITY LIMITATIONS: carrying, standing, stairs, and locomotion level  PARTICIPATION LIMITATIONS: interpersonal relationship, community activity, and fitness facility routine  PERSONAL FACTORS: Time since onset of injury/illness/exacerbation and 1 comorbidity: TBI  are also affecting patient's functional outcome.   REHAB POTENTIAL: Excellent  CLINICAL DECISION MAKING: Stable/uncomplicated  EVALUATION COMPLEXITY: Low  PLAN:  PT FREQUENCY: 1x/week  PT DURATION: 8 weeks  PLANNED INTERVENTIONS: Therapeutic exercises, Therapeutic activity, Neuromuscular re-education, Balance training, Gait training, Patient/Family education, Self Care, Joint mobilization, Stair training, Vestibular training, Canalith repositioning, Orthotic/Fit training, DME instructions, Aquatic Therapy, Dry Needling, Electrical stimulation, Spinal mobilization, Moist heat, Splintting, Taping, Traction, Ultrasound, Ionotophoresis /ml Dexamethasone, and Manual therapy  PLAN FOR NEXT SESSION: trouble shoot for gym program   10:48 AM, 11/03/22 M. Shary Decamp, PT, DPT Physical Therapist- [place] Office Number: [phone]

## 2022-11-10 ENCOUNTER — Ambulatory Visit: Payer: BC Managed Care – PPO

## 2022-11-10 DIAGNOSIS — R293 Abnormal posture: Secondary | ICD-10-CM

## 2022-11-10 DIAGNOSIS — G8111 Spastic hemiplegia affecting right dominant side: Secondary | ICD-10-CM

## 2022-11-10 DIAGNOSIS — R262 Difficulty in walking, not elsewhere classified: Secondary | ICD-10-CM

## 2022-11-10 DIAGNOSIS — R2681 Unsteadiness on feet: Secondary | ICD-10-CM

## 2022-11-10 NOTE — Therapy (Signed)
OUTPATIENT PHYSICAL THERAPY NEURO TREATMENT   Patient Name: Paul Kane MRN: [id number] DOB:1986/01/06, 37 y.o., male Today's Date: 11/10/2022   PCP: Mohammed Kindle REFERRING PROVIDER: Ranelle Oyster, MD   END OF SESSION:  PT End of Session - 11/10/22 0807     Visit Number 2    Number of Visits 8    Date for PT Re-Evaluation 01/12/23    Authorization Type [employer]    PT Start Time 0807    PT Stop Time 0845    PT Time Calculation (min) 38 min    Equipment Utilized During Treatment Gait belt             Past Medical History:  Diagnosis Date   Cerebral thrombosis with cerebral infarction    Chronic hepatitis B    Fixed pupils    Herniation of brain stem    Hypertension    Intracranial injury of other and unspecified nature, without mention of open intracranial wound, unspecified state of consciousness    Intracranial shunt    Paralysis    Spastic hemiplegia affecting dominant side    Stroke    Traumatic brain injury    Trouble swallowing    Visual disturbance    Weakness    Past Surgical History:  Procedure Laterality Date   cramiectomy     CRANIOPLASTY     REMOVAL OF GASTROSTOMY TUBE     VENTRICULOPERITONEAL SHUNT     Patient Active Problem List   Diagnosis Date Noted   SIRS due to infectious process with acute organ dysfunction 03/14/2021   SIRS (systemic inflammatory response syndrome) 03/12/2021   Syncope 03/12/2021   Traumatic brain injury with persistent deficit 03/12/2021   Hypokalemia 03/12/2021   Lactic acidosis 03/12/2021   Mixed hyperlipidemia 04/25/2018   Seizure disorder 04/11/2018   Mechanical low back pain 05/12/2017   Dysarthria 12/20/2015   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/20/2012   Deafness, sensorineural 04/20/2012   Buzzing in ear 04/20/2012   Leaking percutaneous endoscopic gastrostomy (PEG) tube 02/04/2012   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 pre-existing conscious  levels, sequela 10/12/2011   Cerebral infarct 10/12/2011   Spastic hemiplegia of right dominant side due to noncerebrovascular etiology 10/12/2011   Dysphagia 10/12/2011   Functioning G-tube 03/24/2011   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   Hemiplegia of dominant side (HCC) 07/28/2010   CEREBRAL HEMORRHAGE 07/28/2010   PERSONAL HISTORY OF TRAUMATIC BRAIN INJURY 07/02/2010   ALLERGIC RHINITIS CAUSE UNSPECIFIED 10/25/2008   GASTROENTERITIS 08/09/2008   CONTACT DERMATITIS&OTHER ECZEMA DUE DETERGENTS 08/18/2007   HEPATITIS B, CHRONIC 06/28/2007   Chronic viral hepatitis B without delta-agent 06/28/2007    ONSET DATE: recent hx of falls  REFERRING DIAG: G81.11 (ICD-10-CM) - Spastic hemiplegia of right dominant side due to noncerebrovascular etiology   THERAPY DIAG:  Spastic hemiplegia affecting right dominant side, unspecified etiology  Abnormal posture  Unsteadiness on feet  Difficulty in walking, not elsewhere classified  Rationale for Evaluation and Treatment: Rehabilitation  SUBJECTIVE:  SUBJECTIVE STATEMENT: C/o posterior right knee pain last night, not hurting now Pt accompanied by: family member-father  PERTINENT HISTORY: hx of TBI  PAIN:  Are you having pain? No  PRECAUTIONS: Fall  WEIGHT BEARING RESTRICTIONS: No  FALLS: Has patient fallen in last 6 months? Yes. Number of falls multiple  LIVING ENVIRONMENT: Lives with: lives with their family Lives in: House/apartment Stairs: Yes: Internal: flight steps; on left going up and External: 3 steps; both Has following equipment at home: None  PLOF: Needs assistance with ADLs, Needs assistance with homemaking, and Needs assistance with gait  PATIENT GOALS:   OBJECTIVE:   TODAY'S TREATMENT: 11/10/22 Activity Comments   Pre-gait/gait in parallel bars -forwards/backwards x 2 min -sidestepping x 2 min, tactile cues to pelvis for neutral -LLE step advancement -LLE step height advancement -lunge steps RLE 2x10 -Gait training CGA to improve hip rotation -step-ups 3x10, 8" step UE support, facilitation of right knee ext and hip ext  Seated hamstring curls 3x10 15#  TKE 3x10 15#, assist for maintaining foot plantigrade RLE             PATIENT EDUCATION: Education details: assessment findings Person educated: Patient and Parent Education method: Explanation Education comprehension: needs further education  HOME EXERCISE PROGRAM: Access Code: [email or web address] URL: [email or web address] Date: 11/10/2022 Prepared by: Shary Decamp  Exercises - Standing Terminal Knee Extension with Resistance  - 1 x daily - 7 x weekly - 3 sets - 10 reps - 2 sec hold - Forward Step Up  - 1 x daily - 7 x weekly - 3 sets - 10 reps DIAGNOSTIC FINDINGS: n/a  COGNITION: Overall cognitive status: History of cognitive impairments - at baseline   SENSATION: Not tested  COORDINATION: Severely impaired RUE/RLE    MUSCLE TONE: RLE: Hypertonic  MUSCLE LENGTH: Full knee extension. Right plantarflexion contracture of 10-12 deg    POSTURE: weight shift left  LOWER EXTREMITY ROM:     WFL except for right ankle dorsiflexion, limited by spasticity and contracture  LOWER EXTREMITY MMT:    MMT Right Eval Left Eval  Hip flexion 3+ 5  Hip extension    Hip abduction 3+ 5  Hip adduction    Hip internal rotation    Hip external rotation    Knee flexion 3+ 5  Knee extension 4+ 5  Ankle dorsiflexion    Ankle plantarflexion    Ankle inversion    Ankle eversion    (Blank rows = not tested)  BED MOBILITY:  NT  TRANSFERS: Assistive device utilized: None  Sit to stand: Complete Independence and Modified independence Stand to sit: Complete Independence and Modified independence Chair to chair: Complete  Independence and Modified independence Floor:  NT    CURB:  Level of Assistance: SBA and CGA Assistive device utilized: None Curb Comments: gait belt  STAIRS: NT  GAIT: Gait pattern: decreased stride length, circumduction- Right, and poor foot clearance- Right Distance walked:  Assistive device utilized:  right hinged ankle brace Level of assistance: Modified independence and SBA Comments:   FUNCTIONAL TESTS:  5 times sit to stand: 13 sec Timed up and go (TUG): 15 sec MCTSIB: Condition 1: Avg of 3 trials: 30 sec, Condition 2: Avg of 3 trials: 30 sec, Condition 3: Avg of 3 trials: 30 sec, Condition 4: Avg of 3 trials: 15 sec, and Total Score: 105/120 Single leg stance: 2 sec on LLE and 0 sec on RLE  Oculomotor screening: dysconjugate gaze and resting alignment. End-range nystagmus, saccades and smooth  pursuits impaired. Horizontal VOR impaired > vertical.  Present since time of TBI    GOALS: Goals reviewed with patient? Yes  SHORT TERM GOALS: Target date: 12/01/2022    Patient will be independent in HEP to improve functional outcomes Baseline: Goal status: IN PROGRESS  2.  Improve postural stability per mild-moderate sway condition 4 M-CTSIB x 30 sec to reduce risk for falls and reveal improved postural awareness Baseline: mod-severe x 15 sec Goal status: IN PROGRESS    LONG TERM GOALS: Target date: 01/12/2023    Pt and parent to be indep in advanced HEP which will hopefully include relevant gym machines for strength Baseline:  Goal status: INITIAL  2.  Demo improved postural control per 120/120 sec M-CTSIB Baseline: 105/120 Goal status: INITIAL  3.  Reduce risk for falls per time 12 sec TUG test Baseline: 15 sec Goal status: INITIAL  4.  Increase right hip flexor and hamstring strength to 4/5 to improve limb advancement and stance phase stability in gait cycle Baseline: 3+/5 Goal status: INITIAL    ASSESSMENT:  CLINICAL IMPRESSION: Initiated training  for step length and height advancement to improve coordination and stride. Training in improving RLE power for stair ambulation and faciitation of knee/hip extension. Isolated strength training for right knee to improve control. Continued sesssions to advance POC details  OBJECTIVE IMPAIRMENTS: Abnormal gait, decreased activity tolerance, decreased balance, decreased endurance, difficulty walking, decreased strength, impaired tone, and impaired vision/preception.   ACTIVITY LIMITATIONS: carrying, standing, stairs, and locomotion level  PARTICIPATION LIMITATIONS: interpersonal relationship, community activity, and fitness facility routine  PERSONAL FACTORS: Time since onset of injury/illness/exacerbation and 1 comorbidity: TBI  are also affecting patient's functional outcome.   REHAB POTENTIAL: Excellent  CLINICAL DECISION MAKING: Stable/uncomplicated  EVALUATION COMPLEXITY: Low  PLAN:  PT FREQUENCY: 1x/week  PT DURATION: 8 weeks  PLANNED INTERVENTIONS: Therapeutic exercises, Therapeutic activity, Neuromuscular re-education, Balance training, Gait training, Patient/Family education, Self Care, Joint mobilization, Stair training, Vestibular training, Canalith repositioning, Orthotic/Fit training, DME instructions, Aquatic Therapy, Dry Needling, Electrical stimulation, Spinal mobilization, Moist heat, Splintting, Taping, Traction, Ultrasound, Ionotophoresis /ml Dexamethasone, and Manual therapy  PLAN FOR NEXT SESSION: trouble shoot for gym program   8:08 AM, 11/10/22 M. Shary Decamp, PT, DPT Physical Therapist- [place] Office Number: [phone]

## 2022-11-17 ENCOUNTER — Ambulatory Visit: Payer: BC Managed Care – PPO

## 2022-11-17 DIAGNOSIS — R262 Difficulty in walking, not elsewhere classified: Secondary | ICD-10-CM

## 2022-11-17 DIAGNOSIS — G8111 Spastic hemiplegia affecting right dominant side: Secondary | ICD-10-CM

## 2022-11-17 DIAGNOSIS — R2681 Unsteadiness on feet: Secondary | ICD-10-CM

## 2022-11-17 DIAGNOSIS — R293 Abnormal posture: Secondary | ICD-10-CM

## 2022-11-17 NOTE — Therapy (Signed)
OUTPATIENT PHYSICAL THERAPY NEURO TREATMENT   Patient Name: Paul Kane MRN: [id number] DOB:1986-01-21, 37 y.o., male Today's Date: 11/17/2022   PCP: Mohammed Kindle REFERRING PROVIDER: Ranelle Oyster, MD   END OF SESSION:  PT End of Session - 11/17/22 0926     Visit Number 3    Number of Visits 8    Date for PT Re-Evaluation 01/12/23    Authorization Type BCBS    PT Start Time 0930    PT Stop Time 1015    PT Time Calculation (min) 45 min    Equipment Utilized During Treatment Gait belt             Past Medical History:  Diagnosis Date   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HCC)    Chronic hepatitis B (HCC)    Fixed pupils    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HCC)    Hypertension    Intracranial injury of other and unspecified nature, without mention of open intracranial wound, unspecified state of consciousness    Intracranial shunt    Paralysis (HCC)    Spastic hemiplegia affecting dominant side (HCC)    Stroke (HCC)    Traumatic brain injury (HCC)    Trouble swallowing    Visual disturbance    Weakness    Past Surgical History:  Procedure Laterality Date   cramiectomy     CRANIOPLASTY     REMOVAL OF GASTROSTOMY TUBE     VENTRICULOPERITONEAL SHUNT     Patient Active Problem List   Diagnosis Date Noted   SIRS due to infectious process with acute organ dysfunction (HCC) 03/14/2021   SIRS (systemic inflammatory response syndrome) (HCC) 03/12/2021   Syncope 03/12/2021   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) 03/12/2021   Hypokalemia 03/12/2021   Lactic acidosis 03/12/2021   Mixed hyperlipidemia 04/25/2018   Seizure disorder (HCC) 04/11/2018   Mechanical low back pain 05/12/2017   Dysarthria 12/20/2015   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/20/2012   Deafness, sensorineural 04/20/2012   Buzzing in ear 04/20/2012   Leaking percutaneous endoscopic gastrostomy (PEG) tube (HCC) 02/04/2012   Diffuse traumatic brain injury with loss of  consciousness greater than 24 hours with return to pre-existing conscious levels, sequela (HCC) 10/12/2011   Cerebral infarct (HCC) 10/12/2011   Spastic hemiplegia of right dominant side due to noncerebrovascular etiology (HCC) 10/12/2011   Dysphagia 10/12/2011   Functioning G-tube 03/24/2011   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   Hemiplegia of dominant side (HCC) 07/28/2010   CEREBRAL HEMORRHAGE 07/28/2010   PERSONAL HISTORY OF TRAUMATIC BRAIN INJURY 07/02/2010   ALLERGIC RHINITIS CAUSE UNSPECIFIED 10/25/2008   GASTROENTERITIS 08/09/2008   CONTACT DERMATITIS&OTHER ECZEMA DUE DETERGENTS 08/18/2007   HEPATITIS B, CHRONIC 06/28/2007   Chronic viral hepatitis B without delta-agent (HCC) 06/28/2007    ONSET DATE: recent hx of falls  REFERRING DIAG: G81.11 (ICD-10-CM) - Spastic hemiplegia of right dominant side due to noncerebrovascular etiology   THERAPY DIAG:  Spastic hemiplegia affecting right dominant side, unspecified etiology (HCC)  Abnormal posture  Unsteadiness on feet  Difficulty in walking, not elsewhere classified  Rationale for Evaluation and Treatment: Rehabilitation  SUBJECTIVE:  SUBJECTIVE STATEMENT: No issues, wore knee cage for RLE Pt accompanied by: family member-father  PERTINENT HISTORY: hx of TBI  PAIN:  Are you having pain? No  PRECAUTIONS: Fall  WEIGHT BEARING RESTRICTIONS: No  FALLS: Has patient fallen in last 6 months? Yes. Number of falls multiple  LIVING ENVIRONMENT: Lives with: lives with their family Lives in: House/apartment Stairs: Yes: Internal: flight steps; on left going up and External: 3 steps; both Has following equipment at home: None  PLOF: Needs assistance with ADLs, Needs assistance with homemaking, and Needs assistance with gait  PATIENT  GOALS:   OBJECTIVE:   TODAY'S TREATMENT: 11/17/22 Activity Comments  Pre-gait -drills for step length and limb advacement 2x10 -midstance control 2x10 w/ 2" box, then with rocker board  Single leg support Forced weightbearing single leg on Bosu for 30 sec rounds.  Difficulty in maintaining balance w/ RLE  LAQ 3x10 4#  Standing hamstring curls 3x10 4#, facilitation of right knee flexion and RLE stance stability  Seated hamstring curls 3x10  15#        TODAY'S TREATMENT: 11/10/22 Activity Comments  Pre-gait/gait in parallel bars -forwards/backwards x 2 min -sidestepping x 2 min, tactile cues to pelvis for neutral -LLE step advancement -LLE step height advancement -lunge steps RLE 2x10 -Gait training CGA to improve hip rotation -step-ups 3x10, 8" step UE support, facilitation of right knee ext and hip ext  Seated hamstring curls 3x10 15#  TKE 3x10 15#, assist for maintaining foot plantigrade RLE             PATIENT EDUCATION: Education details: assessment findings Person educated: Patient and Parent Education method: Explanation Education comprehension: needs further education  HOME EXERCISE PROGRAM: Access Code: [email or web address] URL: [email or web address] Date: 11/10/2022 Prepared by: Shary Decamp  Exercises - Standing Terminal Knee Extension with Resistance  - 1 x daily - 7 x weekly - 3 sets - 10 reps - 2 sec hold - Forward Step Up  - 1 x daily - 7 x weekly - 3 sets - 10 reps DIAGNOSTIC FINDINGS: n/a  COGNITION: Overall cognitive status: History of cognitive impairments - at baseline   SENSATION: Not tested  COORDINATION: Severely impaired RUE/RLE    MUSCLE TONE: RLE: Hypertonic  MUSCLE LENGTH: Full knee extension. Right plantarflexion contracture of 10-12 deg    POSTURE: weight shift left  LOWER EXTREMITY ROM:     WFL except for right ankle dorsiflexion, limited by spasticity and contracture  LOWER EXTREMITY MMT:    MMT Right Eval  Left Eval  Hip flexion 3+ 5  Hip extension    Hip abduction 3+ 5  Hip adduction    Hip internal rotation    Hip external rotation    Knee flexion 3+ 5  Knee extension 4+ 5  Ankle dorsiflexion    Ankle plantarflexion    Ankle inversion    Ankle eversion    (Blank rows = not tested)  BED MOBILITY:  NT  TRANSFERS: Assistive device utilized: None  Sit to stand: Complete Independence and Modified independence Stand to sit: Complete Independence and Modified independence Chair to chair: Complete Independence and Modified independence Floor:  NT    CURB:  Level of Assistance: SBA and CGA Assistive device utilized: None Curb Comments: gait belt  STAIRS: NT  GAIT: Gait pattern: decreased stride length, circumduction- Right, and poor foot clearance- Right Distance walked:  Assistive device utilized:  right hinged ankle brace Level of assistance: Modified independence and SBA Comments:   FUNCTIONAL TESTS:  5 times sit to stand: 13 sec Timed up and go (TUG): 15 sec MCTSIB: Condition 1: Avg of 3 trials: 30 sec, Condition 2: Avg of 3 trials: 30 sec, Condition 3: Avg of 3 trials: 30 sec, Condition 4: Avg of 3 trials: 15 sec, and Total Score: 105/120 Single leg stance: 2 sec on LLE and 0 sec on RLE  Oculomotor screening: dysconjugate gaze and resting alignment. End-range nystagmus, saccades and smooth pursuits impaired. Horizontal VOR impaired > vertical.  Present since time of TBI    GOALS: Goals reviewed with patient? Yes  SHORT TERM GOALS: Target date: 12/01/2022    Patient will be independent in HEP to improve functional outcomes Baseline: Goal status: IN PROGRESS  2.  Improve postural stability per mild-moderate sway condition 4 M-CTSIB x 30 sec to reduce risk for falls and reveal improved postural awareness Baseline: mod-severe x 15 sec Goal status: IN PROGRESS    LONG TERM GOALS: Target date: 01/12/2023    Pt and parent to be indep in advanced HEP which  will hopefully include relevant gym machines for strength Baseline:  Goal status: INITIAL  2.  Demo improved postural control per 120/120 sec M-CTSIB Baseline: 105/120 Goal status: INITIAL  3.  Reduce risk for falls per time 12 sec TUG test Baseline: 15 sec Goal status: INITIAL  4.  Increase right hip flexor and hamstring strength to 4/5 to improve limb advancement and stance phase stability in gait cycle Baseline: 3+/5 Goal status: INITIAL    ASSESSMENT:  CLINICAL IMPRESSION: Training in step advancement and facilitation of swing and mid-stance control to enhance stability in gait and improve mechanics for RLE hip extension and LLE step length. Focus on rapid change of direction to improve control and then with isolated control of right knee movements with tactile cues to improve selective control. Diffculty in maintaining RLE single limb stance w/ LOB  OBJECTIVE IMPAIRMENTS: Abnormal gait, decreased activity tolerance, decreased balance, decreased endurance, difficulty walking, decreased strength, impaired tone, and impaired vision/preception.   ACTIVITY LIMITATIONS: carrying, standing, stairs, and locomotion level  PARTICIPATION LIMITATIONS: interpersonal relationship, community activity, and fitness facility routine  PERSONAL FACTORS: Time since onset of injury/illness/exacerbation and 1 comorbidity: TBI  are also affecting patient's functional outcome.   REHAB POTENTIAL: Excellent  CLINICAL DECISION MAKING: Stable/uncomplicated  EVALUATION COMPLEXITY: Low  PLAN:  PT FREQUENCY: 1x/week  PT DURATION: 8 weeks  PLANNED INTERVENTIONS: Therapeutic exercises, Therapeutic activity, Neuromuscular re-education, Balance training, Gait training, Patient/Family education, Self Care, Joint mobilization, Stair training, Vestibular training, Canalith repositioning, Orthotic/Fit training, DME instructions, Aquatic Therapy, Dry Needling, Electrical stimulation, Spinal mobilization,  Moist heat, Splintting, Taping, Traction, Ultrasound, Ionotophoresis 4mg /ml Dexamethasone, and Manual therapy  PLAN FOR NEXT SESSION: trouble shoot for gym program   9:27 AM, 11/17/22 M. Shary Decamp, PT, DPT Physical Therapist- [place] Office Number: [phone]

## 2022-11-24 ENCOUNTER — Ambulatory Visit: Payer: BC Managed Care – PPO | Attending: Physical Medicine & Rehabilitation

## 2022-11-24 DIAGNOSIS — R2689 Other abnormalities of gait and mobility: Secondary | ICD-10-CM | POA: Diagnosis present

## 2022-11-24 DIAGNOSIS — R262 Difficulty in walking, not elsewhere classified: Secondary | ICD-10-CM | POA: Insufficient documentation

## 2022-11-24 DIAGNOSIS — R293 Abnormal posture: Secondary | ICD-10-CM

## 2022-11-24 DIAGNOSIS — M6281 Muscle weakness (generalized): Secondary | ICD-10-CM | POA: Diagnosis present

## 2022-11-24 DIAGNOSIS — R2681 Unsteadiness on feet: Secondary | ICD-10-CM | POA: Diagnosis present

## 2022-11-24 DIAGNOSIS — G8111 Spastic hemiplegia affecting right dominant side: Secondary | ICD-10-CM | POA: Diagnosis present

## 2022-11-24 NOTE — Therapy (Signed)
OUTPATIENT PHYSICAL THERAPY NEURO TREATMENT   Patient Name: Paul Kane MRN: [id number] DOB:09-20-1985, 37 y.o., male Today's Date: 11/24/2022   PCP: Mohammed Kindle REFERRING PROVIDER: Ranelle Oyster, MD   END OF SESSION:  PT End of Session - 11/24/22 0931     Visit Number 4    Number of Visits 8    Date for PT Re-Evaluation 01/12/23    Authorization Type BCBS    PT Start Time 0930    PT Stop Time 1015    PT Time Calculation (min) 45 min    Equipment Utilized During Treatment Gait belt             Past Medical History:  Diagnosis Date   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HCC)    Chronic hepatitis B (HCC)    Fixed pupils    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HCC)    Hypertension    Intracranial injury of other and unspecified nature, without mention of open intracranial wound, unspecified state of consciousness    Intracranial shunt    Paralysis (HCC)    Spastic hemiplegia affecting dominant side (HCC)    Stroke (HCC)    Traumatic brain injury (HCC)    Trouble swallowing    Visual disturbance    Weakness    Past Surgical History:  Procedure Laterality Date   cramiectomy     CRANIOPLASTY     REMOVAL OF GASTROSTOMY TUBE     VENTRICULOPERITONEAL SHUNT     Patient Active Problem List   Diagnosis Date Noted   SIRS due to infectious process with acute organ dysfunction (HCC) 03/14/2021   SIRS (systemic inflammatory response syndrome) (HCC) 03/12/2021   Syncope 03/12/2021   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) 03/12/2021   Hypokalemia 03/12/2021   Lactic acidosis 03/12/2021   Mixed hyperlipidemia 04/25/2018   Seizure disorder (HCC) 04/11/2018   Mechanical low back pain 05/12/2017   Dysarthria 12/20/2015   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/20/2012   Deafness, sensorineural 04/20/2012   Buzzing in ear 04/20/2012   Leaking percutaneous endoscopic gastrostomy (PEG) tube (HCC) 02/04/2012   Diffuse traumatic brain injury with loss of  consciousness greater than 24 hours with return to pre-existing conscious levels, sequela (HCC) 10/12/2011   Cerebral infarct (HCC) 10/12/2011   Spastic hemiplegia of right dominant side due to noncerebrovascular etiology (HCC) 10/12/2011   Dysphagia 10/12/2011   Functioning G-tube 03/24/2011   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   Hemiplegia of dominant side (HCC) 07/28/2010   CEREBRAL HEMORRHAGE 07/28/2010   PERSONAL HISTORY OF TRAUMATIC BRAIN INJURY 07/02/2010   ALLERGIC RHINITIS CAUSE UNSPECIFIED 10/25/2008   GASTROENTERITIS 08/09/2008   CONTACT DERMATITIS&OTHER ECZEMA DUE DETERGENTS 08/18/2007   HEPATITIS B, CHRONIC 06/28/2007   Chronic viral hepatitis B without delta-agent (HCC) 06/28/2007    ONSET DATE: recent hx of falls  REFERRING DIAG: G81.11 (ICD-10-CM) - Spastic hemiplegia of right dominant side due to noncerebrovascular etiology   THERAPY DIAG:  Spastic hemiplegia affecting right dominant side, unspecified etiology (HCC)  Abnormal posture  Unsteadiness on feet  Difficulty in walking, not elsewhere classified  Muscle weakness (generalized)  Rationale for Evaluation and Treatment: Rehabilitation  SUBJECTIVE:  SUBJECTIVE STATEMENT: Doing well, no recent falls Pt accompanied by: family member-father  PERTINENT HISTORY: hx of TBI  PAIN:  Are you having pain? No  PRECAUTIONS: Fall  WEIGHT BEARING RESTRICTIONS: No  FALLS: Has patient fallen in last 6 months? Yes. Number of falls multiple  LIVING ENVIRONMENT: Lives with: lives with their family Lives in: House/apartment Stairs: Yes: Internal: flight steps; on left going up and External: 3 steps; both Has following equipment at home: None  PLOF: Needs assistance with ADLs, Needs assistance with homemaking, and Needs  assistance with gait  PATIENT GOALS:   OBJECTIVE:    TODAY'S TREATMENT: 11/24/22 Activity Comments  NU-step resistance intervals x 8.5 min 2 min warm-up then 30 sec heavy; 60 sec light.  Assist for RLE mechanics and alignment for hip/knee/ankle extension  RLE step-ups 6-8" W/ LUE HR and therapist assist to RLE kinematics for hip/knee extension  Seated hamstring curls 1x10 15#, 20#  Sit to stand 3x10 -w/ right knee attached to cable cuff at 10# for TKE and therapist blocking feet for stride stance RLE bias  Standing balance Weighted belt for extension and support to midline          PATIENT EDUCATION: Education details: assessment findings Person educated: Patient and Parent Education method: Explanation Education comprehension: needs further education  HOME EXERCISE PROGRAM: Access Code: [email or web address] URL: [email or web address] Date: 11/10/2022 Prepared by: Shary Decamp  Exercises - Standing Terminal Knee Extension with Resistance  - 1 x daily - 7 x weekly - 3 sets - 10 reps - 2 sec hold - Forward Step Up  - 1 x daily - 7 x weekly - 3 sets - 10 reps  DIAGNOSTIC FINDINGS: n/a  COGNITION: Overall cognitive status: History of cognitive impairments - at baseline   SENSATION: Not tested  COORDINATION: Severely impaired RUE/RLE    MUSCLE TONE: RLE: Hypertonic  MUSCLE LENGTH: Full knee extension. Right plantarflexion contracture of 10-12 deg    POSTURE: weight shift left  LOWER EXTREMITY ROM:     WFL except for right ankle dorsiflexion, limited by spasticity and contracture  LOWER EXTREMITY MMT:    MMT Right Eval Left Eval  Hip flexion 3+ 5  Hip extension    Hip abduction 3+ 5  Hip adduction    Hip internal rotation    Hip external rotation    Knee flexion 3+ 5  Knee extension 4+ 5  Ankle dorsiflexion    Ankle plantarflexion    Ankle inversion    Ankle eversion    (Blank rows = not tested)  BED MOBILITY:  NT  TRANSFERS: Assistive  device utilized: None  Sit to stand: Complete Independence and Modified independence Stand to sit: Complete Independence and Modified independence Chair to chair: Complete Independence and Modified independence Floor:  NT    CURB:  Level of Assistance: SBA and CGA Assistive device utilized: None Curb Comments: gait belt  STAIRS: NT  GAIT: Gait pattern: decreased stride length, circumduction- Right, and poor foot clearance- Right Distance walked:  Assistive device utilized:  right hinged ankle brace Level of assistance: Modified independence and SBA Comments:   FUNCTIONAL TESTS:  5 times sit to stand: 13 sec Timed up and go (TUG): 15 sec MCTSIB: Condition 1: Avg of 3 trials: 30 sec, Condition 2: Avg of 3 trials: 30 sec, Condition 3: Avg of 3 trials: 30 sec, Condition 4: Avg of 3 trials: 15 sec, and Total Score: 105/120 Single leg stance: 2 sec on LLE and 0 sec on RLE  Oculomotor screening: dysconjugate gaze and resting alignment. End-range nystagmus, saccades and smooth pursuits impaired. Horizontal VOR impaired > vertical.  Present since time of TBI    GOALS: Goals reviewed with patient? Yes  SHORT TERM GOALS: Target date: 12/01/2022    Patient will be independent in HEP to improve functional outcomes Baseline: Goal status: IN PROGRESS  2.  Improve postural stability per mild-moderate sway condition 4 M-CTSIB x 30 sec to reduce risk for falls and reveal improved postural awareness Baseline: mod-severe x 15 sec Goal status: IN PROGRESS    LONG TERM GOALS: Target date: 01/12/2023    Pt and parent to be indep in advanced HEP which will hopefully include relevant gym machines for strength Baseline:  Goal status: IN PROGRESS  2.  Demo improved postural control per 120/120 sec M-CTSIB Baseline: 105/120 Goal status: IN PROGRESS  3.  Reduce risk for falls per time 12 sec TUG test Baseline: 15 sec Goal status: IN PROGRESS  4.  Increase right hip flexor and  hamstring strength to 4/5 to improve limb advancement and stance phase stability in gait cycle Baseline: 3+/5 Goal status: IN PROGRESS    ASSESSMENT:  CLINICAL IMPRESSION: Continued with activities for motor control for RLE stance stability with emphasis on closed chain movements with facilitation of RLE mechanics for hip/knee extension coordination.  Forced use strategies for transfers to increase RLE output and improve symmetry with sit to stand to reduce risk for falls.  Required physical assist and positoining of therapist to induce desired RLE contribution to prevent LLE compensation, improved carryover from patient with initially requiring max A to CGA-min A to minimize compensations. Continued sessions to meet POC details.   OBJECTIVE IMPAIRMENTS: Abnormal gait, decreased activity tolerance, decreased balance, decreased endurance, difficulty walking, decreased strength, impaired tone, and impaired vision/preception.   ACTIVITY LIMITATIONS: carrying, standing, stairs, and locomotion level  PARTICIPATION LIMITATIONS: interpersonal relationship, community activity, and fitness facility routine  PERSONAL FACTORS: Time since onset of injury/illness/exacerbation and 1 comorbidity: TBI  are also affecting patient's functional outcome.   REHAB POTENTIAL: Excellent  CLINICAL DECISION MAKING: Stable/uncomplicated  EVALUATION COMPLEXITY: Low  PLAN:  PT FREQUENCY: 1x/week  PT DURATION: 8 weeks  PLANNED INTERVENTIONS: Therapeutic exercises, Therapeutic activity, Neuromuscular re-education, Balance training, Gait training, Patient/Family education, Self Care, Joint mobilization, Stair training, Vestibular training, Canalith repositioning, Orthotic/Fit training, DME instructions, Aquatic Therapy, Dry Needling, Electrical stimulation, Spinal mobilization, Moist heat, Splintting, Taping, Traction, Ultrasound, Ionotophoresis 4mg /ml Dexamethasone, and Manual therapy  PLAN FOR NEXT SESSION:  trouble shoot for gym program   9:32 AM, 11/24/22 M. Shary Decamp, PT, DPT Physical Therapist- [place] Office Number: [phone]

## 2022-12-01 ENCOUNTER — Ambulatory Visit: Payer: BC Managed Care – PPO

## 2022-12-01 DIAGNOSIS — R2681 Unsteadiness on feet: Secondary | ICD-10-CM

## 2022-12-01 DIAGNOSIS — G8111 Spastic hemiplegia affecting right dominant side: Secondary | ICD-10-CM

## 2022-12-01 DIAGNOSIS — M6281 Muscle weakness (generalized): Secondary | ICD-10-CM

## 2022-12-01 DIAGNOSIS — R262 Difficulty in walking, not elsewhere classified: Secondary | ICD-10-CM

## 2022-12-01 DIAGNOSIS — R293 Abnormal posture: Secondary | ICD-10-CM

## 2022-12-01 NOTE — Therapy (Signed)
OUTPATIENT PHYSICAL THERAPY NEURO TREATMENT   Patient Name: Paul Kane MRN: [id number] DOB:01/06/1986, 37 y.o., male Today's Date: 12/01/2022   PCP: Mohammed Kindle REFERRING PROVIDER: Ranelle Oyster, MD   END OF SESSION:  PT End of Session - 12/01/22 0936     Visit Number 5    Number of Visits 8    Date for PT Re-Evaluation 01/12/23    Authorization Type BCBS    PT Start Time 0935    PT Stop Time 1015    PT Time Calculation (min) 40 min    Equipment Utilized During Treatment Gait belt             Past Medical History:  Diagnosis Date   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HCC)    Chronic hepatitis B (HCC)    Fixed pupils    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HCC)    Hypertension    Intracranial injury of other and unspecified nature, without mention of open intracranial wound, unspecified state of consciousness    Intracranial shunt    Paralysis (HCC)    Spastic hemiplegia affecting dominant side (HCC)    Stroke (HCC)    Traumatic brain injury (HCC)    Trouble swallowing    Visual disturbance    Weakness    Past Surgical History:  Procedure Laterality Date   cramiectomy     CRANIOPLASTY     REMOVAL OF GASTROSTOMY TUBE     VENTRICULOPERITONEAL SHUNT     Patient Active Problem List   Diagnosis Date Noted   SIRS due to infectious process with acute organ dysfunction (HCC) 03/14/2021   SIRS (systemic inflammatory response syndrome) (HCC) 03/12/2021   Syncope 03/12/2021   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) 03/12/2021   Hypokalemia 03/12/2021   Lactic acidosis 03/12/2021   Mixed hyperlipidemia 04/25/2018   Seizure disorder (HCC) 04/11/2018   Mechanical low back pain 05/12/2017   Dysarthria 12/20/2015   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/20/2012   Deafness, sensorineural 04/20/2012   Buzzing in ear 04/20/2012   Leaking percutaneous endoscopic gastrostomy (PEG) tube (HCC) 02/04/2012   Diffuse traumatic brain injury with loss of  consciousness greater than 24 hours with return to pre-existing conscious levels, sequela (HCC) 10/12/2011   Cerebral infarct (HCC) 10/12/2011   Spastic hemiplegia of right dominant side due to noncerebrovascular etiology (HCC) 10/12/2011   Dysphagia 10/12/2011   Functioning G-tube 03/24/2011   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   Hemiplegia of dominant side (HCC) 07/28/2010   CEREBRAL HEMORRHAGE 07/28/2010   PERSONAL HISTORY OF TRAUMATIC BRAIN INJURY 07/02/2010   ALLERGIC RHINITIS CAUSE UNSPECIFIED 10/25/2008   GASTROENTERITIS 08/09/2008   CONTACT DERMATITIS&OTHER ECZEMA DUE DETERGENTS 08/18/2007   HEPATITIS B, CHRONIC 06/28/2007   Chronic viral hepatitis B without delta-agent (HCC) 06/28/2007    ONSET DATE: recent hx of falls  REFERRING DIAG: G81.11 (ICD-10-CM) - Spastic hemiplegia of right dominant side due to noncerebrovascular etiology   THERAPY DIAG:  Spastic hemiplegia affecting right dominant side, unspecified etiology (HCC)  Abnormal posture  Unsteadiness on feet  Difficulty in walking, not elsewhere classified  Muscle weakness (generalized)  Rationale for Evaluation and Treatment: Rehabilitation  SUBJECTIVE:  SUBJECTIVE STATEMENT: Feeling good.   Pt accompanied by: family member-father  PERTINENT HISTORY: hx of TBI  PAIN:  Are you having pain? No  PRECAUTIONS: Fall  WEIGHT BEARING RESTRICTIONS: No  FALLS: Has patient fallen in last 6 months? Yes. Number of falls multiple  LIVING ENVIRONMENT: Lives with: lives with their family Lives in: House/apartment Stairs: Yes: Internal: flight steps; on left going up and External: 3 steps; both Has following equipment at home: None  PLOF: Needs assistance with ADLs, Needs assistance with homemaking, and Needs assistance with  gait  PATIENT GOALS:   OBJECTIVE:    TODAY'S TREATMENT: 12/01/22 Activity Comments  Pre-gait -activities to facilitate RLE terminal stance and presqing 3x10 -Bosu taps w/ 10# RLE for fast advancement -small step for RLE swing phase mechanics 3x10 10# RLE and therapist facilitating trunk/hip extension  Retro-walking -CGA-min A to facilitate incr left hip extension 6x25 ft and facilitate right trunk/hip extension/SLS  Standing on foam Static balance, head turns, EO/EC                 PATIENT EDUCATION: Education details: assessment findings Person educated: Patient and Parent Education method: Explanation Education comprehension: needs further education  HOME EXERCISE PROGRAM: Access Code: [email or web address] URL: [email or web address] Date: 11/10/2022 Prepared by: Shary Decamp  Exercises - Standing Terminal Knee Extension with Resistance  - 1 x daily - 7 x weekly - 3 sets - 10 reps - 2 sec hold - Forward Step Up  - 1 x daily - 7 x weekly - 3 sets - 10 reps  DIAGNOSTIC FINDINGS: n/a  COGNITION: Overall cognitive status: History of cognitive impairments - at baseline   SENSATION: Not tested  COORDINATION: Severely impaired RUE/RLE    MUSCLE TONE: RLE: Hypertonic  MUSCLE LENGTH: Full knee extension. Right plantarflexion contracture of 10-12 deg    POSTURE: weight shift left  LOWER EXTREMITY ROM:     WFL except for right ankle dorsiflexion, limited by spasticity and contracture  LOWER EXTREMITY MMT:    MMT Right Eval Left Eval  Hip flexion 3+ 5  Hip extension    Hip abduction 3+ 5  Hip adduction    Hip internal rotation    Hip external rotation    Knee flexion 3+ 5  Knee extension 4+ 5  Ankle dorsiflexion    Ankle plantarflexion    Ankle inversion    Ankle eversion    (Blank rows = not tested)  BED MOBILITY:  NT  TRANSFERS: Assistive device utilized: None  Sit to stand: Complete Independence and Modified independence Stand to sit:  Complete Independence and Modified independence Chair to chair: Complete Independence and Modified independence Floor:  NT    CURB:  Level of Assistance: SBA and CGA Assistive device utilized: None Curb Comments: gait belt  STAIRS: NT  GAIT: Gait pattern: decreased stride length, circumduction- Right, and poor foot clearance- Right Distance walked:  Assistive device utilized:  right hinged ankle brace Level of assistance: Modified independence and SBA Comments:   FUNCTIONAL TESTS:  5 times sit to stand: 13 sec Timed up and go (TUG): 15 sec MCTSIB: Condition 1: Avg of 3 trials: 30 sec, Condition 2: Avg of 3 trials: 30 sec, Condition 3: Avg of 3 trials: 30 sec, Condition 4: Avg of 3 trials: 15 sec, and Total Score: 105/120 Single leg stance: 2 sec on LLE and 0 sec on RLE  Oculomotor screening: dysconjugate gaze and resting alignment. End-range nystagmus, saccades and smooth pursuits impaired. Horizontal VOR impaired > vertical.  Present since time of TBI    GOALS: Goals reviewed with patient? Yes  SHORT TERM GOALS: Target date: 12/01/2022    Patient will be independent in HEP to improve functional outcomes Baseline: Goal status: IN PROGRESS  2.  Improve postural stability per mild-moderate sway condition 4 M-CTSIB x 30 sec to reduce risk for falls and reveal improved postural awareness Baseline: mod-severe x 15 sec Goal status: IN PROGRESS    LONG TERM GOALS: Target date: 01/12/2023    Pt and parent to be indep in advanced HEP which will hopefully include relevant gym machines for strength Baseline:  Goal status: IN PROGRESS  2.  Demo improved postural control per 120/120 sec M-CTSIB Baseline: 105/120 Goal status: IN PROGRESS  3.  Reduce risk for falls per time 12 sec TUG test Baseline: 15 sec Goal status: IN PROGRESS  4.  Increase right hip flexor and hamstring strength to 4/5 to improve limb advancement and stance phase stability in gait cycle Baseline:  3+/5 Goal status: IN PROGRESS    ASSESSMENT:  CLINICAL IMPRESSION: Skilled treatment with focus on improved RLE coordination, strength, and fluidity with techniques to enhance isolated limb control as well as faster paced movements to encourage a more natural swing phase for RLE and use of tactile cues to promote RLE stability in stance and to induce hip extension to facilitate LLE step length in forward and retro directions. Difficulty with isolated right hip extension requiring min A for advancement/coordination and tactile cue to induce active hip extension, but to good effect.  Difficulty with postural control by end of session due to fatigue.   OBJECTIVE IMPAIRMENTS: Abnormal gait, decreased activity tolerance, decreased balance, decreased endurance, difficulty walking, decreased strength, impaired tone, and impaired vision/preception.   ACTIVITY LIMITATIONS: carrying, standing, stairs, and locomotion level  PARTICIPATION LIMITATIONS: interpersonal relationship, community activity, and fitness facility routine  PERSONAL FACTORS: Time since onset of injury/illness/exacerbation and 1 comorbidity: TBI  are also affecting patient's functional outcome.   REHAB POTENTIAL: Excellent  CLINICAL DECISION MAKING: Stable/uncomplicated  EVALUATION COMPLEXITY: Low  PLAN:  PT FREQUENCY: 1x/week  PT DURATION: 8 weeks  PLANNED INTERVENTIONS: Therapeutic exercises, Therapeutic activity, Neuromuscular re-education, Balance training, Gait training, Patient/Family education, Self Care, Joint mobilization, Stair training, Vestibular training, Canalith repositioning, Orthotic/Fit training, DME instructions, Aquatic Therapy, Dry Needling, Electrical stimulation, Spinal mobilization, Moist heat, Splintting, Taping, Traction, Ultrasound, Ionotophoresis 4mg /ml Dexamethasone, and Manual therapy  PLAN FOR NEXT SESSION: trouble shoot for gym program   9:36 AM, 12/01/22 M. Shary Decamp, PT, DPT Physical  Therapist- [place] Office Number: [phone]

## 2022-12-08 ENCOUNTER — Ambulatory Visit: Payer: BC Managed Care – PPO

## 2022-12-08 DIAGNOSIS — M6281 Muscle weakness (generalized): Secondary | ICD-10-CM

## 2022-12-08 DIAGNOSIS — R293 Abnormal posture: Secondary | ICD-10-CM

## 2022-12-08 DIAGNOSIS — G8111 Spastic hemiplegia affecting right dominant side: Secondary | ICD-10-CM

## 2022-12-08 DIAGNOSIS — R2681 Unsteadiness on feet: Secondary | ICD-10-CM

## 2022-12-08 DIAGNOSIS — R262 Difficulty in walking, not elsewhere classified: Secondary | ICD-10-CM

## 2022-12-08 NOTE — Therapy (Signed)
OUTPATIENT PHYSICAL THERAPY NEURO TREATMENT   Patient Name: Paul Kane MRN: [id number] DOB:1986-03-18, 37 y.o., male Today's Date: 12/08/2022   PCP: Mohammed Kindle REFERRING PROVIDER: Ranelle Oyster, MD   END OF SESSION:  PT End of Session - 12/08/22 0930     Visit Number 6    Number of Visits 8    Date for PT Re-Evaluation 01/12/23    Authorization Type BCBS    PT Start Time 0930    PT Stop Time 1015    PT Time Calculation (min) 45 min    Equipment Utilized During Treatment Gait belt             Past Medical History:  Diagnosis Date   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HCC)    Chronic hepatitis B (HCC)    Fixed pupils    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HCC)    Hypertension    Intracranial injury of other and unspecified nature, without mention of open intracranial wound, unspecified state of consciousness    Intracranial shunt    Paralysis (HCC)    Spastic hemiplegia affecting dominant side (HCC)    Stroke (HCC)    Traumatic brain injury (HCC)    Trouble swallowing    Visual disturbance    Weakness    Past Surgical History:  Procedure Laterality Date   cramiectomy     CRANIOPLASTY     REMOVAL OF GASTROSTOMY TUBE     VENTRICULOPERITONEAL SHUNT     Patient Active Problem List   Diagnosis Date Noted   SIRS due to infectious process with acute organ dysfunction (HCC) 03/14/2021   SIRS (systemic inflammatory response syndrome) (HCC) 03/12/2021   Syncope 03/12/2021   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) 03/12/2021   Hypokalemia 03/12/2021   Lactic acidosis 03/12/2021   Mixed hyperlipidemia 04/25/2018   Seizure disorder (HCC) 04/11/2018   Mechanical low back pain 05/12/2017   Dysarthria 12/20/2015   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/20/2012   Deafness, sensorineural 04/20/2012   Buzzing in ear 04/20/2012   Leaking percutaneous endoscopic gastrostomy (PEG) tube (HCC) 02/04/2012   Diffuse traumatic brain injury with loss of  consciousness greater than 24 hours with return to pre-existing conscious levels, sequela (HCC) 10/12/2011   Cerebral infarct (HCC) 10/12/2011   Spastic hemiplegia of right dominant side due to noncerebrovascular etiology (HCC) 10/12/2011   Dysphagia 10/12/2011   Functioning G-tube 03/24/2011   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   Hemiplegia of dominant side (HCC) 07/28/2010   CEREBRAL HEMORRHAGE 07/28/2010   PERSONAL HISTORY OF TRAUMATIC BRAIN INJURY 07/02/2010   ALLERGIC RHINITIS CAUSE UNSPECIFIED 10/25/2008   GASTROENTERITIS 08/09/2008   CONTACT DERMATITIS&OTHER ECZEMA DUE DETERGENTS 08/18/2007   HEPATITIS B, CHRONIC 06/28/2007   Chronic viral hepatitis B without delta-agent (HCC) 06/28/2007    ONSET DATE: recent hx of falls  REFERRING DIAG: G81.11 (ICD-10-CM) - Spastic hemiplegia of right dominant side due to noncerebrovascular etiology   THERAPY DIAG:  Spastic hemiplegia affecting right dominant side, unspecified etiology (HCC)  Abnormal posture  Unsteadiness on feet  Difficulty in walking, not elsewhere classified  Muscle weakness (generalized)  Rationale for Evaluation and Treatment: Rehabilitation  SUBJECTIVE:  SUBJECTIVE STATEMENT: Doing some strength training at the gym now  Pt accompanied by: family member-father  PERTINENT HISTORY: hx of TBI  PAIN:  Are you having pain? No  PRECAUTIONS: Fall  WEIGHT BEARING RESTRICTIONS: No  FALLS: Has patient fallen in last 6 months? Yes. Number of falls multiple  LIVING ENVIRONMENT: Lives with: lives with their family Lives in: House/apartment Stairs: Yes: Internal: flight steps; on left going up and External: 3 steps; both Has following equipment at home: None  PLOF: Needs assistance with ADLs, Needs assistance with  homemaking, and Needs assistance with gait  PATIENT GOALS:   OBJECTIVE:    TODAY'S TREATMENT: 12/08/22 Activity Comments  Supine hip flexion 3x10 15# with cable machine and physioball under foot--this was a tedious set-up  Seated hip flexion 1x10 W/ cable 10#  TKE 3x10  30#  Hip hinge 3x10 Tall kneeling on mat blue resistance  Reverse Nordic 3x10   Prone "temper tantrum" 3x10 For isolated knee flexion      PATIENT EDUCATION: Education details: assessment findings Person educated: Patient and Parent Education method: Explanation Education comprehension: needs further education  HOME EXERCISE PROGRAM: Access Code: [email or web address] URL: [email or web address] Date: 11/10/2022 Prepared by: Shary Decamp  Exercises - Standing Terminal Knee Extension with Resistance  - 1 x daily - 7 x weekly - 3 sets - 10 reps - 2 sec hold - Forward Step Up  - 1 x daily - 7 x weekly - 3 sets - 10 reps - Tall Kneeling Hip Hinge  - 1 x daily - 7 x weekly - 3 sets - 10 reps - Tall Kneeling Eccentric Quadriceps Strengthening  - 1 x daily - 7 x weekly - 3 sets - 10 reps  DIAGNOSTIC FINDINGS: n/a  COGNITION: Overall cognitive status: History of cognitive impairments - at baseline   SENSATION: Not tested  COORDINATION: Severely impaired RUE/RLE    MUSCLE TONE: RLE: Hypertonic  MUSCLE LENGTH: Full knee extension. Right plantarflexion contracture of 10-12 deg    POSTURE: weight shift left  LOWER EXTREMITY ROM:     WFL except for right ankle dorsiflexion, limited by spasticity and contracture  LOWER EXTREMITY MMT:    MMT Right Eval Left Eval  Hip flexion 3+ 5  Hip extension    Hip abduction 3+ 5  Hip adduction    Hip internal rotation    Hip external rotation    Knee flexion 3+ 5  Knee extension 4+ 5  Ankle dorsiflexion    Ankle plantarflexion    Ankle inversion    Ankle eversion    (Blank rows = not tested)  BED MOBILITY:  NT  TRANSFERS: Assistive device  utilized: None  Sit to stand: Complete Independence and Modified independence Stand to sit: Complete Independence and Modified independence Chair to chair: Complete Independence and Modified independence Floor:  NT    CURB:  Level of Assistance: SBA and CGA Assistive device utilized: None Curb Comments: gait belt  STAIRS: NT  GAIT: Gait pattern: decreased stride length, circumduction- Right, and poor foot clearance- Right Distance walked:  Assistive device utilized:  right hinged ankle brace Level of assistance: Modified independence and SBA Comments:   FUNCTIONAL TESTS:  5 times sit to stand: 13 sec Timed up and go (TUG): 15 sec MCTSIB: Condition 1: Avg of 3 trials: 30 sec, Condition 2: Avg of 3 trials: 30 sec, Condition 3: Avg of 3 trials: 30 sec, Condition 4: Avg of 3 trials: 15 sec, and Total Score: 105/120 Single leg stance:  2 sec on LLE and 0 sec on RLE  Oculomotor screening: dysconjugate gaze and resting alignment. End-range nystagmus, saccades and smooth pursuits impaired. Horizontal VOR impaired > vertical.  Present since time of TBI    GOALS: Goals reviewed with patient? Yes  SHORT TERM GOALS: Target date: 12/01/2022    Patient will be independent in HEP to improve functional outcomes Baseline: Goal status: IN PROGRESS  2.  Improve postural stability per mild-moderate sway condition 4 M-CTSIB x 30 sec to reduce risk for falls and reveal improved postural awareness Baseline: mod-severe x 15 sec Goal status: IN PROGRESS    LONG TERM GOALS: Target date: 01/12/2023    Pt and parent to be indep in advanced HEP which will hopefully include relevant gym machines for strength Baseline:  Goal status: IN PROGRESS  2.  Demo improved postural control per 120/120 sec M-CTSIB Baseline: 105/120 Goal status: IN PROGRESS  3.  Reduce risk for falls per time 12 sec TUG test Baseline: 15 sec Goal status: IN PROGRESS  4.  Increase right hip flexor and hamstring  strength to 4/5 to improve limb advancement and stance phase stability in gait cycle Baseline: 3+/5 Goal status: IN PROGRESS    ASSESSMENT:  CLINICAL IMPRESSION: Additional instruction in techniques to apply to gym routine with emphasis on isolated movements to inhibit tone and improve strength for RLE single limb support. Good return demo from caregiver for assist.   OBJECTIVE IMPAIRMENTS: Abnormal gait, decreased activity tolerance, decreased balance, decreased endurance, difficulty walking, decreased strength, impaired tone, and impaired vision/preception.   ACTIVITY LIMITATIONS: carrying, standing, stairs, and locomotion level  PARTICIPATION LIMITATIONS: interpersonal relationship, community activity, and fitness facility routine  PERSONAL FACTORS: Time since onset of injury/illness/exacerbation and 1 comorbidity: TBI  are also affecting patient's functional outcome.   REHAB POTENTIAL: Excellent  CLINICAL DECISION MAKING: Stable/uncomplicated  EVALUATION COMPLEXITY: Low  PLAN:  PT FREQUENCY: 1x/week  PT DURATION: 8 weeks  PLANNED INTERVENTIONS: Therapeutic exercises, Therapeutic activity, Neuromuscular re-education, Balance training, Gait training, Patient/Family education, Self Care, Joint mobilization, Stair training, Vestibular training, Canalith repositioning, Orthotic/Fit training, DME instructions, Aquatic Therapy, Dry Needling, Electrical stimulation, Spinal mobilization, Moist heat, Splintting, Taping, Traction, Ultrasound, Ionotophoresis 4mg /ml Dexamethasone, and Manual therapy  PLAN FOR NEXT SESSION: trouble shoot for gym program   9:31 AM, 12/08/22 M. Shary Decamp, PT, DPT Physical Therapist- [place] Office Number: [phone]

## 2022-12-15 ENCOUNTER — Ambulatory Visit: Payer: BC Managed Care – PPO

## 2022-12-15 DIAGNOSIS — R2681 Unsteadiness on feet: Secondary | ICD-10-CM

## 2022-12-15 DIAGNOSIS — G8111 Spastic hemiplegia affecting right dominant side: Secondary | ICD-10-CM

## 2022-12-15 DIAGNOSIS — R2689 Other abnormalities of gait and mobility: Secondary | ICD-10-CM

## 2022-12-15 DIAGNOSIS — R293 Abnormal posture: Secondary | ICD-10-CM

## 2022-12-15 DIAGNOSIS — R262 Difficulty in walking, not elsewhere classified: Secondary | ICD-10-CM

## 2022-12-15 NOTE — Therapy (Signed)
OUTPATIENT PHYSICAL THERAPY NEURO TREATMENT   Patient Name: Paul Kane MRN: [id number] DOB:1986/03/02, 37 y.o., male Today's Date: 12/15/2022   PCP: Mohammed Kindle REFERRING PROVIDER: Ranelle Oyster, MD   END OF SESSION:  PT End of Session - 12/15/22 0937     Visit Number 7    Number of Visits 8    Date for PT Re-Evaluation 01/12/23    Authorization Type BCBS    PT Start Time 0930    PT Stop Time 1015    PT Time Calculation (min) 45 min    Equipment Utilized During Treatment Gait belt             Past Medical History:  Diagnosis Date   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HCC)    Chronic hepatitis B (HCC)    Fixed pupils    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HCC)    Hypertension    Intracranial injury of other and unspecified nature, without mention of open intracranial wound, unspecified state of consciousness    Intracranial shunt    Paralysis (HCC)    Spastic hemiplegia affecting dominant side (HCC)    Stroke (HCC)    Traumatic brain injury (HCC)    Trouble swallowing    Visual disturbance    Weakness    Past Surgical History:  Procedure Laterality Date   cramiectomy     CRANIOPLASTY     REMOVAL OF GASTROSTOMY TUBE     VENTRICULOPERITONEAL SHUNT     Patient Active Problem List   Diagnosis Date Noted   SIRS due to infectious process with acute organ dysfunction (HCC) 03/14/2021   SIRS (systemic inflammatory response syndrome) (HCC) 03/12/2021   Syncope 03/12/2021   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) 03/12/2021   Hypokalemia 03/12/2021   Lactic acidosis 03/12/2021   Mixed hyperlipidemia 04/25/2018   Seizure disorder (HCC) 04/11/2018   Mechanical low back pain 05/12/2017   Dysarthria 12/20/2015   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/20/2012   Deafness, sensorineural 04/20/2012   Buzzing in ear 04/20/2012   Leaking percutaneous endoscopic gastrostomy (PEG) tube (HCC) 02/04/2012   Diffuse traumatic brain injury with loss of  consciousness greater than 24 hours with return to pre-existing conscious levels, sequela (HCC) 10/12/2011   Cerebral infarct (HCC) 10/12/2011   Spastic hemiplegia of right dominant side due to noncerebrovascular etiology (HCC) 10/12/2011   Dysphagia 10/12/2011   Functioning G-tube 03/24/2011   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  Rationale for Evaluation and Treatment: Rehabilitation  SUBJECTIVE:  SUBJECTIVE STATEMENT: Feeling under the weather over the weekend  Pt accompanied by: family member-father  PERTINENT HISTORY: hx of TBI  PAIN:  Are you having pain? No  PRECAUTIONS: Fall  WEIGHT BEARING RESTRICTIONS: No  FALLS: Has patient fallen in last 6 months? Yes. Number of falls multiple  LIVING ENVIRONMENT: Lives with: lives with their family Lives in: House/apartment Stairs: Yes: Internal: flight steps; on left going up and External: 3 steps; both Has following equipment at home: None  PLOF: Needs assistance with ADLs, Needs assistance with homemaking, and Needs assistance with gait  PATIENT GOALS:   OBJECTIVE:   TODAY'S TREATMENT: 12/15/22 Activity Comments  Recumbent cycling x 10 min "Hills" for resistance intervals  Gait  training -use of resistance at midline and manual contact to right hip at terminal stance to promote increased stride and reciprocal pattern -techniques to facilitate large/fast amplitude of RLE swing phase -techniques to promote incr step length via contact to right hip extension in terminal stance -retrowalking w/ CGA-min A to facilitate hip extension                 TODAY'S TREATMENT: 12/08/22 Activity Comments  Supine hip flexion 3x10 15# with cable machine and physioball under foot--this was a tedious set-up  Seated hip flexion 1x10 W/ cable 10#  TKE 3x10  30#  Hip hinge 3x10 Tall kneeling on mat blue resistance  Reverse Nordic 3x10   Prone "temper tantrum" 3x10 For isolated knee flexion      PATIENT EDUCATION: Education details: assessment findings Person educated: Patient and Parent Education method: Explanation Education comprehension: needs further education  HOME EXERCISE PROGRAM: Access Code: [email or web address] URL: [email or web address] Date: 11/10/2022 Prepared by: Shary Decamp  Exercises - Standing Terminal Knee Extension with Resistance  - 1 x daily - 7 x weekly - 3 sets - 10 reps - 2 sec hold - Forward Step Up  - 1 x daily - 7 x weekly - 3 sets - 10 reps - Tall Kneeling Hip Hinge  - 1 x daily - 7 x weekly - 3 sets - 10 reps - Tall Kneeling Eccentric Quadriceps Strengthening  - 1 x daily - 7 x weekly - 3 sets - 10 reps  DIAGNOSTIC FINDINGS: n/a  COGNITION: Overall cognitive status: History of cognitive impairments - at baseline   SENSATION: Not tested  COORDINATION: Severely impaired RUE/RLE    MUSCLE TONE: RLE: Hypertonic  MUSCLE LENGTH: Full knee extension. Right plantarflexion contracture of 10-12 deg    POSTURE: weight shift left  LOWER EXTREMITY ROM:     WFL except for right ankle dorsiflexion, limited by spasticity and contracture  LOWER EXTREMITY MMT:    MMT Right Eval Left Eval  Hip flexion 3+ 5  Hip extension    Hip  abduction 3+ 5  Hip adduction    Hip internal rotation    Hip external rotation    Knee flexion 3+ 5  Knee extension 4+ 5  Ankle dorsiflexion    Ankle plantarflexion    Ankle inversion    Ankle eversion    (Blank rows = not tested)  BED MOBILITY:  NT  TRANSFERS: Assistive device utilized: None  Sit to stand: Complete Independence and Modified independence Stand to sit: Complete Independence and Modified independence Chair to chair: Complete Independence and Modified independence Floor:  NT    CURB:  Level of Assistance: SBA and CGA Assistive device utilized: None Curb Comments: gait belt  STAIRS: NT  GAIT: Gait pattern: decreased stride length,  circumduction- Right, and poor foot clearance- Right Distance walked:  Assistive device utilized:  right hinged ankle brace Level of assistance: Modified independence and SBA Comments:   FUNCTIONAL TESTS:  5 times sit to stand: 13 sec Timed up and go (TUG): 15 sec MCTSIB: Condition 1: Avg of 3 trials: 30 sec, Condition 2: Avg of 3 trials: 30 sec, Condition 3: Avg of 3 trials: 30 sec, Condition 4: Avg of 3 trials: 15 sec, and Total Score: 105/120 Single leg stance: 2 sec on LLE and 0 sec on RLE  Oculomotor screening: dysconjugate gaze and resting alignment. End-range nystagmus, saccades and smooth pursuits impaired. Horizontal VOR impaired > vertical.  Present since time of TBI    GOALS: Goals reviewed with patient? Yes  SHORT TERM GOALS: Target date: 12/01/2022    Patient will be independent in HEP to improve functional outcomes Baseline: Goal status: IN PROGRESS  2.  Improve postural stability per mild-moderate sway condition 4 M-CTSIB x 30 sec to reduce risk for falls and reveal improved postural awareness Baseline: mod-severe x 15 sec Goal status: IN PROGRESS    LONG TERM GOALS: Target date: 01/12/2023    Pt and parent to be indep in advanced HEP which will hopefully include relevant gym machines for  strength Baseline:  Goal status: IN PROGRESS  2.  Demo improved postural control per 120/120 sec M-CTSIB Baseline: 105/120 Goal status: IN PROGRESS  3.  Reduce risk for falls per time 12 sec TUG test Baseline: 15 sec Goal status: IN PROGRESS  4.  Increase right hip flexor and hamstring strength to 4/5 to improve limb advancement and stance phase stability in gait cycle Baseline: 3+/5 Goal status: IN PROGRESS    ASSESSMENT:  CLINICAL IMPRESSION: Training with emphasis on increasing stride and techniques to facilitate left step length by way of contact and facilitation of RLE kinematics.  Able to demo carryover as long as manual contact present but limited carryover. Re-assessment at next session for D/C summary  OBJECTIVE IMPAIRMENTS: Abnormal gait, decreased activity tolerance, decreased balance, decreased endurance, difficulty walking, decreased strength, impaired tone, and impaired vision/preception.   ACTIVITY LIMITATIONS: carrying, standing, stairs, and locomotion level  PARTICIPATION LIMITATIONS: interpersonal relationship, community activity, and fitness facility routine  PERSONAL FACTORS: Time since onset of injury/illness/exacerbation and 1 comorbidity: TBI  are also affecting patient's functional outcome.   REHAB POTENTIAL: Excellent  CLINICAL DECISION MAKING: Stable/uncomplicated  EVALUATION COMPLEXITY: Low  PLAN:  PT FREQUENCY: 1x/week  PT DURATION: 8 weeks  PLANNED INTERVENTIONS: Therapeutic exercises, Therapeutic activity, Neuromuscular re-education, Balance training, Gait training, Patient/Family education, Self Care, Joint mobilization, Stair training, Vestibular training, Canalith repositioning, Orthotic/Fit training, DME instructions, Aquatic Therapy, Dry Needling, Electrical stimulation, Spinal mobilization, Moist heat, Splintting, Taping, Traction, Ultrasound, Ionotophoresis 4mg /ml Dexamethasone, and Manual therapy  PLAN FOR NEXT SESSION: trouble shoot  for gym program   9:44 AM, 12/15/22 M. Shary Decamp, PT, DPT Physical Therapist- [place] Office Number: [phone]

## 2022-12-22 ENCOUNTER — Ambulatory Visit: Payer: BC Managed Care – PPO | Attending: Physical Medicine & Rehabilitation

## 2022-12-22 DIAGNOSIS — R262 Difficulty in walking, not elsewhere classified: Secondary | ICD-10-CM | POA: Insufficient documentation

## 2022-12-22 DIAGNOSIS — R293 Abnormal posture: Secondary | ICD-10-CM | POA: Insufficient documentation

## 2022-12-22 DIAGNOSIS — R2681 Unsteadiness on feet: Secondary | ICD-10-CM | POA: Diagnosis present

## 2022-12-22 DIAGNOSIS — R2689 Other abnormalities of gait and mobility: Secondary | ICD-10-CM | POA: Insufficient documentation

## 2022-12-22 DIAGNOSIS — G8111 Spastic hemiplegia affecting right dominant side: Secondary | ICD-10-CM | POA: Diagnosis present

## 2022-12-22 DIAGNOSIS — M6281 Muscle weakness (generalized): Secondary | ICD-10-CM | POA: Diagnosis present

## 2022-12-22 NOTE — Therapy (Signed)
OUTPATIENT PHYSICAL THERAPY NEURO TREATMENT and D/C Summary   Patient Name: Paul Kane MRN: [id number] DOB:Oct 25, 1985, 37 y.o., male Today's Date: 12/22/2022   PCP: Mohammed Kindle REFERRING PROVIDER: Ranelle Oyster, MD   PHYSICAL THERAPY DISCHARGE SUMMARY  Visits from Start of Care: 8  Current functional level related to goals / functional outcomes: RLE strength limited by presence of spasticity affecting gross motor control/coordination causing significant gait deviations (chronic).  Improved performance on TUG test and M-CTSIB   Remaining deficits: RLE weakness and spasticity   Education / Equipment: HEP and trouble shooting for fitness facility/gym routine to continue with progressive resisted exercise   Patient agrees to discharge. Patient goals were partially met. Patient is being discharged due to being pleased with the current functional level.   END OF SESSION:  PT End of Session - 12/22/22 0939     Visit Number 8    Number of Visits 8    Date for PT Re-Evaluation 01/12/23    Authorization Type BCBS    PT Start Time 0935    PT Stop Time 1015    PT Time Calculation (min) 40 min    Equipment Utilized During Treatment Gait belt             Past Medical History:  Diagnosis Date   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HCC)    Chronic hepatitis B (HCC)    Fixed pupils    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HCC)    Hypertension    Intracranial injury of other and unspecified nature, without mention of open intracranial wound, unspecified state of consciousness    Intracranial shunt    Paralysis (HCC)    Spastic hemiplegia affecting dominant side (HCC)    Stroke (HCC)    Traumatic brain injury (HCC)    Trouble swallowing    Visual disturbance    Weakness    Past Surgical History:  Procedure Laterality Date   cramiectomy     CRANIOPLASTY     REMOVAL OF GASTROSTOMY TUBE     VENTRICULOPERITONEAL SHUNT     Patient Active Problem List    Diagnosis Date Noted   SIRS due to infectious process with acute organ dysfunction (HCC) 03/14/2021   SIRS (systemic inflammatory response syndrome) (HCC) 03/12/2021   Syncope 03/12/2021   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) 03/12/2021   Hypokalemia 03/12/2021   Lactic acidosis 03/12/2021   Mixed hyperlipidemia 04/25/2018   Seizure disorder (HCC) 04/11/2018   Mechanical low back pain 05/12/2017   Dysarthria 12/20/2015   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/20/2012   Deafness, sensorineural 04/20/2012   Buzzing in ear 04/20/2012   Leaking percutaneous endoscopic gastrostomy (PEG) tube (HCC) 02/04/2012   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 pre-existing conscious levels, sequela (HCC) 10/12/2011   Cerebral infarct (HCC) 10/12/2011   Spastic hemiplegia of right dominant side due to noncerebrovascular etiology (HCC) 10/12/2011   Dysphagia 10/12/2011   Functioning G-tube 03/24/2011   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   Hemiplegia of dominant side (HCC) 07/28/2010   CEREBRAL HEMORRHAGE 07/28/2010   PERSONAL HISTORY OF TRAUMATIC BRAIN INJURY 07/02/2010   ALLERGIC RHINITIS CAUSE UNSPECIFIED 10/25/2008   GASTROENTERITIS 08/09/2008   CONTACT DERMATITIS&OTHER ECZEMA DUE DETERGENTS 08/18/2007   HEPATITIS B, CHRONIC 06/28/2007   Chronic viral hepatitis B without delta-agent (HCC) 06/28/2007    ONSET DATE: recent hx of falls  REFERRING DIAG: G81.11 (ICD-10-CM) - Spastic hemiplegia of right dominant side due to noncerebrovascular etiology  THERAPY DIAG:  Spastic hemiplegia affecting right dominant side, unspecified etiology (HCC)  Other abnormalities of gait and mobility  Abnormal posture  Unsteadiness on feet  Difficulty in walking, not elsewhere classified  Muscle weakness (generalized)  Rationale for Evaluation and Treatment: Rehabilitation  SUBJECTIVE:                                                                                                                                                                                              SUBJECTIVE STATEMENT: Feeling better and plan on hitting the gym 3x/wk  Pt accompanied by: family member-father  PERTINENT HISTORY: hx of TBI  PAIN:  Are you having pain? No  PRECAUTIONS: Fall  WEIGHT BEARING RESTRICTIONS: No  FALLS: Has patient fallen in last 6 months? Yes. Number of falls multiple  LIVING ENVIRONMENT: Lives with: lives with their family Lives in: House/apartment Stairs: Yes: Internal: flight steps; on left going up and External: 3 steps; both Has following equipment at home: None  PLOF: Needs assistance with ADLs, Needs assistance with homemaking, and Needs assistance with gait  PATIENT GOALS:   OBJECTIVE:   TODAY'S TREATMENT: 12/22/22 Activity Comments  STG/LTG addressed   Demo techniques for additional stretching and mobility strategies for land/pool                       PATIENT EDUCATION: Education details: assessment findings Person educated: Patient and Parent Education method: Explanation Education comprehension: needs further education  HOME EXERCISE PROGRAM: Access Code: [email or web address] URL: [email or web address] Date: 11/10/2022 Prepared by: Shary Decamp  Exercises - Standing Terminal Knee Extension with Resistance  - 1 x daily - 7 x weekly - 3 sets - 10 reps - 2 sec hold - Forward Step Up  - 1 x daily - 7 x weekly - 3 sets - 10 reps - Tall Kneeling Hip Hinge  - 1 x daily - 7 x weekly - 3 sets - 10 reps - Tall Kneeling Eccentric Quadriceps Strengthening  - 1 x daily - 7 x weekly - 3 sets - 10 reps  DIAGNOSTIC FINDINGS: n/a  COGNITION: Overall cognitive status: History of cognitive impairments - at baseline   SENSATION: Not tested  COORDINATION: Severely impaired RUE/RLE    MUSCLE TONE: RLE: Hypertonic  MUSCLE LENGTH: Full knee extension. Right plantarflexion contracture of 10-12  deg    POSTURE: weight shift left  LOWER EXTREMITY ROM:     WFL except for right ankle dorsiflexion, limited by spasticity and contracture  LOWER EXTREMITY MMT:    MMT Right Eval Left Eval  Hip flexion 3+ 5  Hip extension  Hip abduction 3+ 5  Hip adduction    Hip internal rotation    Hip external rotation    Knee flexion 3+ 5  Knee extension 4+ 5  Ankle dorsiflexion    Ankle plantarflexion    Ankle inversion    Ankle eversion    (Blank rows = not tested)  BED MOBILITY:  NT  TRANSFERS: Assistive device utilized: None  Sit to stand: Complete Independence and Modified independence Stand to sit: Complete Independence and Modified independence Chair to chair: Complete Independence and Modified independence Floor:  NT    CURB:  Level of Assistance: SBA and CGA Assistive device utilized: None Curb Comments: gait belt  STAIRS: NT  GAIT: Gait pattern: decreased stride length, circumduction- Right, and poor foot clearance- Right Distance walked:  Assistive device utilized:  right hinged ankle brace Level of assistance: Modified independence and SBA Comments:   FUNCTIONAL TESTS:  5 times sit to stand: 13 sec Timed up and go (TUG): 15 sec MCTSIB: Condition 1: Avg of 3 trials: 30 sec, Condition 2: Avg of 3 trials: 30 sec, Condition 3: Avg of 3 trials: 30 sec, Condition 4: Avg of 3 trials: 15 sec, and Total Score: 105/120 Single leg stance: 2 sec on LLE and 0 sec on RLE  Oculomotor screening: dysconjugate gaze and resting alignment. End-range nystagmus, saccades and smooth pursuits impaired. Horizontal VOR impaired > vertical.  Present since time of TBI    GOALS: Goals reviewed with patient? Yes  SHORT TERM GOALS: Target date: 12/01/2022    Patient will be independent in HEP to improve functional outcomes Baseline: Goal status: MET  2.  Improve postural stability per mild-moderate sway condition 4 M-CTSIB x 30 sec to reduce risk for falls and reveal  improved postural awareness Baseline: mod-severe x 15 sec; (12/22/22) 30 sec mild-mod sway Goal status: MET    LONG TERM GOALS: Target date: 01/12/2023    Pt and parent to be indep in advanced HEP which will hopefully include relevant gym machines for strength Baseline:  Goal status: MET  2.  Demo improved postural control per 120/120 sec M-CTSIB Baseline: 105/120; (12/22/22) 120/120 Goal status: MET  3.  Reduce risk for falls per time 12 sec TUG test Baseline: 15 sec; (12/22/22) 13 sec Goal status: NOT MET  4.  Increase right hip flexor and hamstring strength to 4/5 to improve limb advancement and stance phase stability in gait cycle Baseline: 3+/5; (12/22/22) 3+/5 right hip flexor, 4-/5 right hamstring Goal status: NOT MET    ASSESSMENT:  CLINICAL IMPRESSION: D/C assessment performed and demo improved postural stability per M-CTSIB assessment and able to improve time slightly on TUG test.  Continues to be limited by RLE spasticity and subsequent weakness.  Tx sessions have been focused on mgmt strategies and collaboration with caregiver for gym-based activities to progress strength and develop techniques for motor control in land/aquatic based exercise format.  Patient and caregiver report intention to attend local gym 3x/wk to continue program.   OBJECTIVE IMPAIRMENTS: Abnormal gait, decreased activity tolerance, decreased balance, decreased endurance, difficulty walking, decreased strength, impaired tone, and impaired vision/preception.   ACTIVITY LIMITATIONS: carrying, standing, stairs, and locomotion level  PARTICIPATION LIMITATIONS: interpersonal relationship, community activity, and fitness facility routine  PERSONAL FACTORS: Time since onset of injury/illness/exacerbation and 1 comorbidity: TBI  are also affecting patient's functional outcome.   REHAB POTENTIAL: Excellent  CLINICAL DECISION MAKING: Stable/uncomplicated  EVALUATION COMPLEXITY: Low  PLAN:  PT FREQUENCY:  1x/week  PT DURATION: 8 weeks  PLANNED INTERVENTIONS: Therapeutic exercises, Therapeutic activity, Neuromuscular re-education, Balance training, Gait training, Patient/Family education, Self Care, Joint mobilization, Stair training, Vestibular training, Canalith repositioning, Orthotic/Fit training, DME instructions, Aquatic Therapy, Dry Needling, Electrical stimulation, Spinal mobilization, Moist heat, Splintting, Taping, Traction, Ultrasound, Ionotophoresis 4mg /ml Dexamethasone, and Manual therapy  PLAN FOR NEXT SESSION: D/C to HEP   9:39 AM, 12/22/22 M. Shary Decamp, PT, DPT Physical Therapist- [place] Office Number: [phone]

## 2022-12-29 ENCOUNTER — Ambulatory Visit: Payer: BC Managed Care – PPO

## 2023-01-27 ENCOUNTER — Encounter: Payer: BC Managed Care – PPO | Admitting: Physical Medicine & Rehabilitation

## 2023-02-03 ENCOUNTER — Encounter
Payer: BC Managed Care – PPO | Attending: Physical Medicine & Rehabilitation | Admitting: Physical Medicine & Rehabilitation

## 2023-02-03 ENCOUNTER — Encounter: Payer: Self-pay | Admitting: Physical Medicine & Rehabilitation

## 2023-02-03 VITALS — BP 110/75 | HR 76 | Ht 67.0 in | Wt 174.0 lb

## 2023-02-03 DIAGNOSIS — G8111 Spastic hemiplegia affecting right dominant side: Secondary | ICD-10-CM | POA: Diagnosis present

## 2023-02-03 MED ORDER — SODIUM CHLORIDE (PF) 0.9 % IJ SOLN
5.0000 mL | Freq: Once | INTRAMUSCULAR | Status: AC
  Administered 2023-02-03: 5 mL

## 2023-02-03 MED ORDER — INCOBOTULINUMTOXINA 100 UNITS IM SOLR
500.0000 [IU] | Freq: Once | INTRAMUSCULAR | Status: AC
  Administered 2023-02-03: 500 [IU] via INTRAMUSCULAR

## 2023-02-03 NOTE — Progress Notes (Signed)
Xeomin Injection for spasticity of upper extremity using needle EMG guidance Indication: Spastic hemiplegia of right dominant side due to noncerebrovascular etiology (HCC) - Plan: incobotulinumtoxinA (XEOMIN) 100 units injection 500 Units, sodium chloride (PF) 0.9 % injection 5 mL G81.11  Dilution: 100 Units/ml        Total Units Injected: 500 Indication: Severe spasticity which interferes with ADL,mobility and/or  hygiene and is unresponsive to medication management and other conservative care Informed consent was obtained after describing risks and benefits of the procedure with the patient. This includes bleeding, bruising, infection, excessive weakness, or medication side effects. A REMS form is on file and signed.  Needle: 50mm injectable monopolar needle electrode    Number of units per muscle Pectoralis Major 0 units Pectoralis Minor 0 units Biceps 100 units 2 access pts Brachioradialis 0 units FCR 0 units FCU 0 units FDS 0 units FDP 0 units FPL 0 units Palmaris Longus 0 units Pronator Teres 0 units Pronator Quadratus 0 units Lumbricals 100 units, 4 access pts Gastrocs 200 units, 4 access pts Tibialis Posterior 100 units 2 access pts All injections were done after obtaining appropriate EMG activity and after negative drawback for blood. The patient tolerated the procedure well. Post procedure instructions were given. No follow-ups on file.

## 2023-02-03 NOTE — Patient Instructions (Signed)
 ALWAYS FEEL FREE TO CALL OUR OFFICE WITH ANY PROBLEMS OR QUESTIONS ([phone])  **PLEASE NOTE** ALL MEDICATION REFILL REQUESTS (INCLUDING CONTROLLED SUBSTANCES) NEED TO BE MADE AT LEAST 7 DAYS PRIOR TO REFILL BEING DUE. ANY REFILL REQUESTS INSIDE THAT TIME FRAME MAY RESULT IN DELAYS IN RECEIVING YOUR PRESCRIPTION.                    

## 2023-02-16 NOTE — Therapy (Addendum)
OUTPATIENT PHYSICAL THERAPY NEURO EVALUATION   Patient Name: Paul Kane MRN: [id number] DOB:Jul 19, 1986, 37 y.o., male Today's Date: 02/17/2023   PCP: Bailey Mech, PA-C  REFERRING PROVIDER: Ranelle Oyster, MD  END OF SESSION:  PT End of Session - 02/17/23 1357     Visit Number 1    Number of Visits 13    Date for PT Re-Evaluation 03/31/23    Authorization Type BCBS/Medicaid    Authorization Time Period auth submitted on eval    PT Start Time 1318    PT Stop Time 1355    PT Time Calculation (min) 37 min    Equipment Utilized During Treatment Gait belt    Activity Tolerance Patient tolerated treatment well    Behavior During Therapy WFL for tasks assessed/performed             Past Medical History:  Diagnosis Date   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HCC)    Chronic hepatitis B (HCC)    Fixed pupils    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HCC)    Hypertension    Intracranial injury of other and unspecified nature, without mention of open intracranial wound, unspecified state of consciousness    Intracranial shunt    Paralysis (HCC)    Spastic hemiplegia affecting dominant side (HCC)    Stroke (HCC)    Traumatic brain injury (HCC)    Trouble swallowing    Visual disturbance    Weakness    Past Surgical History:  Procedure Laterality Date   cramiectomy     CRANIOPLASTY     REMOVAL OF GASTROSTOMY TUBE     VENTRICULOPERITONEAL SHUNT     Patient Active Problem List   Diagnosis Date Noted   SIRS due to infectious process with acute organ dysfunction (HCC) 03/14/2021   SIRS (systemic inflammatory response syndrome) (HCC) 03/12/2021   Syncope 03/12/2021   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) 03/12/2021   Hypokalemia 03/12/2021   Lactic acidosis 03/12/2021   Mixed hyperlipidemia 04/25/2018   Seizure disorder (HCC) 04/11/2018   Mechanical low back pain 05/12/2017   Dysarthria 12/20/2015   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/20/2012    Deafness, sensorineural 04/20/2012   Buzzing in ear 04/20/2012   Leaking percutaneous endoscopic gastrostomy (PEG) tube (HCC) 02/04/2012   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 pre-existing conscious levels, sequela (HCC) 10/12/2011   Cerebral infarct (HCC) 10/12/2011   Spastic hemiplegia of right dominant side due to noncerebrovascular etiology (HCC) 10/12/2011   Dysphagia 10/12/2011   Functioning G-tube 03/24/2011   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   Hemiplegia of dominant side (HCC) 07/28/2010   CEREBRAL HEMORRHAGE 07/28/2010   PERSONAL HISTORY OF TRAUMATIC BRAIN INJURY 07/02/2010   ALLERGIC RHINITIS CAUSE UNSPECIFIED 10/25/2008   GASTROENTERITIS 08/09/2008   CONTACT DERMATITIS&OTHER ECZEMA DUE DETERGENTS 08/18/2007   HEPATITIS B, CHRONIC 06/28/2007   Chronic viral hepatitis B without delta-agent (HCC) 06/28/2007    ONSET DATE: 2012  REFERRING DIAG: G81.11 (ICD-10-CM) - Spastic hemiplegia of right dominant side due to noncerebrovascular etiology (HCC)  THERAPY DIAG:  Spastic hemiplegia affecting right dominant side, unspecified etiology (HCC) - Plan: PT plan of care cert/re-cert  Other symptoms and signs involving the nervous system - Plan: PT plan of care cert/re-cert  Unsteadiness on feet - Plan: PT plan of care cert/re-cert  Other muscle spasm - Plan: PT plan of care cert/re-cert  Rationale for Evaluation and Treatment: Rehabilitation  SUBJECTIVE:  SUBJECTIVE STATEMENT: Mother reports that the patient got Botox in the R UE and LE recently. Reports that MD recommended aquatic therapy. Has a R AFO but not wearing it at home. Reports that before Botox the R toes would catch on the floor. Also reports posterior LOB at times. Mother  requesting OT for the R UE.  Pt accompanied by: family member (mother)  PERTINENT HISTORY: Fixed pupils, cerebral thombus with infarction, brain stem herniation, HTN,TBI s/p cranioplasty, VP shunt  PAIN:  Are you having pain? No  PRECAUTIONS: Fall and Other: VP shunt  RED FLAGS: None   WEIGHT BEARING RESTRICTIONS: No   FALLS: Has patient fallen in last 6 months? No  LIVING ENVIRONMENT: Lives with: lives with their family Lives in: House/apartment Stairs:  3 steps to enter; 2nd floor with B handrails Has following equipment at home: Wheelchair (manual) and Tour manager; also has [name]  PLOF: Independent with basic ADLs  PATIENT GOALS: improve R ankle mobility and balance   OBJECTIVE:   DIAGNOSTIC FINDINGS: none recent   COGNITION: Overall cognitive status: History of cognitive impairments - at baseline   SENSATION: WFL; reports reduced sensation in R UE  COORDINATION: Alternating pronation/supination: unable on R UE d/t stiffness Alternating toe tap: unable to coordinate alt motions     EDEMA:  none  MUSCLE TONE: RLE: mild in L ankle, severe in R ankle   POSTURE:  rounded, R elbow flexed   LOWER EXTREMITY ROM:     Active  Right Eval Left Eval  Hip flexion    Hip extension    Hip abduction    Hip adduction    Hip internal rotation    Hip external rotation    Knee flexion    Knee extension    Ankle dorsiflexion 5 14  Ankle plantarflexion    Ankle inversion    Ankle eversion     (Blank rows = not tested)  LOWER EXTREMITY MMT:    MMT Right Eval Left Eval  Hip flexion 4 4+  Hip extension    Hip abduction 4 4+  Hip adduction 4 4+  Hip internal rotation    Hip external rotation    Knee flexion 4 5  Knee extension 4 5  Ankle dorsiflexion 3+ 4+  Ankle plantarflexion 4 4+  Ankle inversion    Ankle eversion    (Blank rows = not tested)   GAIT: Gait pattern: limited R knee flexion during swing, short step length and foot  clearance, visible R ankle inversion tone during swing  Assistive device utilized: None Level of assistance:  HHA from mother   FUNCTIONAL TESTS:  Sharlene Motts: 41/56   [facility] PT Assessment - 02/17/23 0001       Standardized Balance Assessment   Standardized Balance Assessment Berg Balance Test      Berg Balance Test   Sit to Stand Able to stand without using hands and stabilize independently    Standing Unsupported Able to stand safely 2 minutes    Sitting with Back Unsupported but Feet Supported on Floor or Stool Able to sit safely and securely 2 minutes    Stand to Sit Sits safely with minimal use of hands    Transfers Able to transfer safely, minor use of hands    Standing Unsupported with Eyes Closed Able to stand 10 seconds with supervision    Standing Unsupported with Feet Together Able to place feet together independently and stand for 1 minute with supervision  From Standing, Reach Forward with Outstretched Arm Can reach confidently >25 cm (10")    From Standing Position, Pick up Object from Floor Able to pick up shoe, needs supervision    From Standing Position, Turn to Look Behind Over each Shoulder Looks behind one side only/other side shows less weight shift    Turn 360 Degrees Needs close supervision or verbal cueing    Standing Unsupported, Alternately Place Feet on Step/Stool Able to complete >2 steps/needs minimal assist   able to complete 4 reps with min A   Standing Unsupported, One Foot in Front Able to take small step independently and hold 30 seconds    Standing on One Leg Tries to lift leg/unable to hold 3 seconds but remains standing independently    Total Score 41              TODAY'S TREATMENT:                                                                                                                              DATE: 02/17/23    PATIENT EDUCATION: Education details: prognosis, POC, edu on aquatic therapy, encouraged increased wear time of AFO Person  educated: Patient and Parent Education method: Explanation Education comprehension: verbalized understanding  HOME EXERCISE PROGRAM: Not yet initiated   GOALS: Goals reviewed with patient? Yes  SHORT TERM GOALS: Target date: 03/10/2023  Patient to be independent with initial HEP. Baseline: HEP initiated Goal status: INITIAL    LONG TERM GOALS: Target date: 03/31/2023  Patient to be independent with advanced HEP. Baseline: Not yet initiated  Goal status: INITIAL  Patient to demonstrate at least 8 degrees R ankle dorsiflexion AROM to improve safety with ambulation. Baseline: 5 deg Goal status: INITIAL  Patient/mother to report 50% improvement in wear time/frequency of R AFO for max safety. Baseline: NT Goal status: INITIAL  Patient to demonstrate safe curb navigation with supervision/CGA only.  Baseline: NT Goal status: INITIAL  Patient to ambulate 200 ft safely with supervision.   Baseline: HHA with mother Goal status: INITIAL  Patient to score at least 45/56 on Berg in order to decrease risk of falls.  Baseline: 41 Goal status: INITIAL  ASSESSMENT:  CLINICAL IMPRESSION:  Patient is a 37 y/o M presenting to OPPT with mother with c/o R UE/LE spasticity s/p remote TBI in 2011. Patient recently received Botox injections in the R UE and LE and PT order requests aquatic therapy and spasticity management s/p injections. Mother also requests OT referral for R UE stretching. Patient today presenting with limited LE coordination, rounded posture with R UE flexed, increased R>L ankle tone, limited R ankle AROM, marked R LE weakness, gait deviations, and imbalance.  Would benefit from skilled PT services 2 x/week for 6 weeks to address aforementioned impairments in order to optimize level of function.     OBJECTIVE IMPAIRMENTS: Abnormal gait, decreased activity tolerance, decreased balance, decreased coordination, difficulty walking, decreased ROM, decreased  strength, increased  muscle spasms, impaired flexibility, impaired tone, and postural dysfunction.   ACTIVITY LIMITATIONS: carrying, lifting, bending, standing, squatting, sleeping, stairs, transfers, bed mobility, bathing, toileting, dressing, reach over head, hygiene/grooming, and locomotion level  PARTICIPATION LIMITATIONS: meal prep, cleaning, laundry, shopping, community activity, and church  PERSONAL FACTORS: Age, Past/current experiences, Time since onset of injury/illness/exacerbation, and 3+ comorbidities: Fixed pupils, cerebral thombus with infarction, brain stem herniation, HTN,TBI s/p cranioplasty 2011, VP shunt  are also affecting patient's functional outcome.   REHAB POTENTIAL: Good  CLINICAL DECISION MAKING: Evolving/moderate complexity  EVALUATION COMPLEXITY: Moderate  PLAN:  PT FREQUENCY: 2x/week  PT DURATION: 6 weeks  PLANNED INTERVENTIONS: Therapeutic exercises, Therapeutic activity, Neuromuscular re-education, Balance training, Gait training, Patient/Family education, Self Care, Joint mobilization, Stair training, Vestibular training, Canalith repositioning, DME instructions, Aquatic Therapy, Dry Needling, Electrical stimulation, Cryotherapy, Moist heat, Taping, Manual therapy, and Re-evaluation  PLAN FOR NEXT SESSION: R LE spasticity management, aquatic therapy; initiate HEP for LE stretching and balance   AQUATICS: Frequency: 1x Duration: 6 wks Special Instruction: gentle mobility and stretching of the R UE/LE    Anette Guarneri, PT, DPT 02/17/23 4:01 PM  [facility] [address], [address] [place], [place] [address] Phone # [phone] Fax # [phone]

## 2023-02-17 ENCOUNTER — Ambulatory Visit: Payer: BC Managed Care – PPO | Attending: Physical Medicine & Rehabilitation | Admitting: Physical Therapy

## 2023-02-17 ENCOUNTER — Encounter: Payer: Self-pay | Admitting: Physical Therapy

## 2023-02-17 ENCOUNTER — Other Ambulatory Visit: Payer: Self-pay

## 2023-02-17 ENCOUNTER — Telehealth: Payer: Self-pay | Admitting: Physical Therapy

## 2023-02-17 ENCOUNTER — Ambulatory Visit: Payer: BC Managed Care – PPO | Admitting: Physical Medicine & Rehabilitation

## 2023-02-17 DIAGNOSIS — R2681 Unsteadiness on feet: Secondary | ICD-10-CM | POA: Insufficient documentation

## 2023-02-17 DIAGNOSIS — M62838 Other muscle spasm: Secondary | ICD-10-CM | POA: Insufficient documentation

## 2023-02-17 DIAGNOSIS — G8111 Spastic hemiplegia affecting right dominant side: Secondary | ICD-10-CM | POA: Diagnosis present

## 2023-02-17 DIAGNOSIS — R29818 Other symptoms and signs involving the nervous system: Secondary | ICD-10-CM | POA: Diagnosis present

## 2023-02-17 NOTE — Telephone Encounter (Signed)
Certainly. Order is in. They were wanting to focus on gait. Hence, only the order for PT

## 2023-02-17 NOTE — Telephone Encounter (Signed)
Dr. Riley Kill,  Mr. Mandella was evaluated in OPPT for R hemiplegia. The patient would benefit from OT evaluation for R UE spasticity management and possible splinting.    If you agree, please place an order in [facility] in [facility] or fax the order to [phone].  Thank you,  Anette Guarneri, PT, DPT 02/17/23 4:03 PM  [facility], [facility] [address] [place], [place]  [address] Phone:  [phone] Fax:  [phone]

## 2023-02-17 NOTE — Addendum Note (Signed)
Addended by: Faith Rogue T on: 02/17/2023 04:57 PM   Modules accepted: Orders

## 2023-02-18 ENCOUNTER — Telehealth: Payer: Self-pay | Admitting: *Deleted

## 2023-02-18 NOTE — Telephone Encounter (Signed)
Mrs Zadeh called and says that Paul Kane's PT is requesting he get OT as well because of the stiffness of his UE.

## 2023-02-23 ENCOUNTER — Ambulatory Visit: Payer: BC Managed Care – PPO | Attending: Physical Medicine & Rehabilitation | Admitting: Rehabilitation

## 2023-02-23 ENCOUNTER — Encounter: Payer: Self-pay | Admitting: Rehabilitation

## 2023-02-23 DIAGNOSIS — R2681 Unsteadiness on feet: Secondary | ICD-10-CM | POA: Diagnosis present

## 2023-02-23 DIAGNOSIS — R2689 Other abnormalities of gait and mobility: Secondary | ICD-10-CM | POA: Insufficient documentation

## 2023-02-23 DIAGNOSIS — M6281 Muscle weakness (generalized): Secondary | ICD-10-CM | POA: Diagnosis present

## 2023-02-23 DIAGNOSIS — R29818 Other symptoms and signs involving the nervous system: Secondary | ICD-10-CM | POA: Diagnosis present

## 2023-02-23 DIAGNOSIS — R293 Abnormal posture: Secondary | ICD-10-CM | POA: Diagnosis present

## 2023-02-23 DIAGNOSIS — G8111 Spastic hemiplegia affecting right dominant side: Secondary | ICD-10-CM | POA: Diagnosis present

## 2023-02-23 NOTE — Therapy (Signed)
OUTPATIENT PHYSICAL THERAPY NEURO EVALUATION   Patient Name: Paul Kane MRN: [id number] DOB:08-28-1985, 37 y.o., male Today's Date: 02/23/2023   PCP: Bailey Mech, PA-C  REFERRING PROVIDER: Ranelle Oyster, MD  END OF SESSION:  PT End of Session - 02/23/23 0908     Visit Number 2    Number of Visits 13    Date for PT Re-Evaluation 03/31/23    Authorization Type BCBS/Medicaid  (approved 3 visits 8/6-8/19)    Authorization Time Period auth submitted on eval    Authorization - Visit Number 1    Authorization - Number of Visits 3    PT Start Time 1100    PT Stop Time 1145    PT Time Calculation (min) 45 min    Equipment Utilized During Treatment Other (comment)   floatation devices as needed   Activity Tolerance Patient tolerated treatment well    Behavior During Therapy WFL for tasks assessed/performed             Past Medical History:  Diagnosis Date   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HCC)    Chronic hepatitis B (HCC)    Fixed pupils    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HCC)    Hypertension    Intracranial injury of other and unspecified nature, without mention of open intracranial wound, unspecified state of consciousness    Intracranial shunt    Paralysis (HCC)    Spastic hemiplegia affecting dominant side (HCC)    Stroke (HCC)    Traumatic brain injury (HCC)    Trouble swallowing    Visual disturbance    Weakness    Past Surgical History:  Procedure Laterality Date   cramiectomy     CRANIOPLASTY     REMOVAL OF GASTROSTOMY TUBE     VENTRICULOPERITONEAL SHUNT     Patient Active Problem List   Diagnosis Date Noted   SIRS due to infectious process with acute organ dysfunction (HCC) 03/14/2021   SIRS (systemic inflammatory response syndrome) (HCC) 03/12/2021   Syncope 03/12/2021   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) 03/12/2021   Hypokalemia 03/12/2021   Lactic acidosis 03/12/2021   Mixed hyperlipidemia 04/25/2018    Seizure disorder (HCC) 04/11/2018   Mechanical low back pain 05/12/2017   Dysarthria 12/20/2015   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/20/2012   Deafness, sensorineural 04/20/2012   Buzzing in ear 04/20/2012   Leaking percutaneous endoscopic gastrostomy (PEG) tube (HCC) 02/04/2012   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 pre-existing conscious levels, sequela (HCC) 10/12/2011   Cerebral infarct (HCC) 10/12/2011   Spastic hemiplegia of right dominant side due to noncerebrovascular etiology (HCC) 10/12/2011   Dysphagia 10/12/2011   Functioning G-tube 03/24/2011   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   Hemiplegia of dominant side (HCC) 07/28/2010   CEREBRAL HEMORRHAGE 07/28/2010   PERSONAL HISTORY OF TRAUMATIC BRAIN INJURY 07/02/2010   ALLERGIC RHINITIS CAUSE UNSPECIFIED 10/25/2008   GASTROENTERITIS 08/09/2008   CONTACT DERMATITIS&OTHER ECZEMA DUE DETERGENTS 08/18/2007   HEPATITIS B, CHRONIC 06/28/2007   Chronic viral hepatitis B without delta-agent (HCC) 06/28/2007    ONSET DATE: 2012  REFERRING DIAG: G81.11 (ICD-10-CM) - Spastic hemiplegia of right dominant side due to noncerebrovascular etiology (HCC)  THERAPY DIAG:  Spastic hemiplegia affecting right dominant side, unspecified etiology (HCC)  Other symptoms and signs involving the nervous system  Unsteadiness on feet  Other abnormalities of gait and mobility  Rationale for Evaluation and Treatment: Rehabilitation  SUBJECTIVE:  SUBJECTIVE STATEMENT: Pt presents with mother to pool today.  Has not been regularly going to the pool because his dad is out of town but they are hoping to get back to going to the pool.    Pt accompanied by: family member (mother)  PERTINENT HISTORY: Fixed pupils,  cerebral thombus with infarction, brain stem herniation, HTN,TBI s/p cranioplasty, VP shunt  PAIN:  Are you having pain? No  PRECAUTIONS: Fall and Other: VP shunt  RED FLAGS: None   WEIGHT BEARING RESTRICTIONS: No   FALLS: Has patient fallen in last 6 months? No  LIVING ENVIRONMENT: Lives with: lives with their family Lives in: House/apartment Stairs:  3 steps to enter; 2nd floor with B handrails Has following equipment at home: Wheelchair (manual) and Tour manager; also has [name]  PLOF: Independent with basic ADLs  PATIENT GOALS: improve R ankle mobility and balance   OBJECTIVE:   DIAGNOSTIC FINDINGS: none recent   COGNITION: Overall cognitive status: History of cognitive impairments - at baseline   SENSATION: WFL; reports reduced sensation in R UE  COORDINATION: Alternating pronation/supination: unable on R UE d/t stiffness Alternating toe tap: unable to coordinate alt motions     EDEMA:  none  MUSCLE TONE: RLE: mild in L ankle, severe in R ankle   POSTURE:  rounded, R elbow flexed   LOWER EXTREMITY ROM:     Active  Right Eval Left Eval  Hip flexion    Hip extension    Hip abduction    Hip adduction    Hip internal rotation    Hip external rotation    Knee flexion    Knee extension    Ankle dorsiflexion 5 14  Ankle plantarflexion    Ankle inversion    Ankle eversion     (Blank rows = not tested)  LOWER EXTREMITY MMT:    MMT Right Eval Left Eval  Hip flexion 4 4+  Hip extension    Hip abduction 4 4+  Hip adduction 4 4+  Hip internal rotation    Hip external rotation    Knee flexion 4 5  Knee extension 4 5  Ankle dorsiflexion 3+ 4+  Ankle plantarflexion 4 4+  Ankle inversion    Ankle eversion    (Blank rows = not tested)   GAIT: Gait pattern: limited R knee flexion during swing, short step length and foot clearance, visible R ankle inversion tone during swing  Assistive device utilized: None Level of assistance:   HHA from mother   FUNCTIONAL TESTS:  Sharlene Motts: 41/56      TODAY'S TREATMENT:                                                                                                                              DATE: 02/23/23  Patient seen for aquatic therapy today.  Treatment took place in water 3.6-4.0 feet deep depending upon activity.  Pt entered and exited the pool via  stairs using LUE on rail in step to pattern, min A for safety.  Pool temp approx 92 deg.   Warm up:  With LUE HHA from PT.  Forwards walking x approx 18' x 4 laps, backwards walking x 4 laps, side stepping x 4 laps with cues for posture and slow controlled movements.    Stretching:  Standing on bottom step with heels off for B calf stretch x 8 reps with 5 sec hold followed by heel raises (pt going into this without cuing).  With LUE support on wall, placed R foot onto pool bench for quad stretch with PT providing assist for maintaining correct position x 30 secs.    NMR:  Sit<>stand with feet on small step, sitting on pool bench.  PT maintaining position of R foot/LE during transitions x 20 reps with cues for slow movement, esp with descent.  Squats (trying to touch buttocks to bench then return to stand) x 20 reps again with cues for slow controlled movement.  Side stepping in squat position (with HHA from PT) x 2 laps with cues for maintaining lateral position as he would want to turn trunk towards direction stepping.  Forward lunges with HHA on pts LUE x 4 laps.  Max fading to min guiding cues on technique but pt demonstrating much improved technique after 2 laps.  LLE step downs forward x 10 reps, LLE step downs backwards x 10 reps, all with HHA from PT with PT also ensuring RLE remains in place throughout.  Forward marching x 2 laps, with cues and light facilitation for improved forward weight shift over RLE during stance.  Standing hip ext with knee flex x 10 reps on each side with emphasis on improved stance control when moving LLE and  improved knee flex when moving RLE.  Pt is able to break out of tone fairly well during all tasks but tends to keep RLE abd away from body.    Attempted to do tall kneeling tasks with knees on small step facing bench.  He is able to get into position first try and when cued to perform LLE hip extension, tone in RLE increased causing him to need to stand up.  Attempted again, however he reports knee pain so discontinued.    Spoke with mother briefly about plan.  She reports he is scheduled alternately doing pool then land appts.  PT to reach out to primary PT as he is only approved for 3 visits.     Pt requires buoyancy of water for support for reduced fall risk and for unloading/reduced stress on joints as pt able to tolerate increased standing and ambulation in water compared to that on land which will help reduce tone; viscosity of water is needed for resistance for strengthening and current of water provides perturbations for challenge for balance training.  PATIENT EDUCATION: Education details: aquatic rationale  Person educated: Patient and Parent Education method: Explanation Education comprehension: verbalized understanding  HOME EXERCISE PROGRAM: Not yet initiated   GOALS: Goals reviewed with patient? Yes  SHORT TERM GOALS: Target date: 03/10/2023  Patient to be independent with initial HEP. Baseline: HEP initiated Goal status: INITIAL    LONG TERM GOALS: Target date: 03/31/2023  Patient to be independent with advanced HEP. Baseline: Not yet initiated  Goal status: INITIAL  Patient to demonstrate at least 8 degrees R ankle dorsiflexion AROM to improve safety with ambulation. Baseline: 5 deg Goal status: INITIAL  Patient/mother to report 50% improvement in wear time/frequency of  R AFO for max safety. Baseline: NT Goal status: INITIAL  Patient to demonstrate safe curb navigation with supervision/CGA only.  Baseline: NT Goal status: INITIAL  Patient to ambulate 200  ft safely with supervision.   Baseline: HHA with mother Goal status: INITIAL  Patient to score at least 45/56 on Berg in order to decrease risk of falls.  Baseline: 41 Goal status: INITIAL  ASSESSMENT:  CLINICAL IMPRESSION:  Pt presents for first session in pool at [facility].  Pt/family is familiar with this pool as they have membership and have received aquatic PT in the past.  He tolerated session well today and feel that he would benefit from a couple more visits to reestablish HEP so that he may return to doing pool exercises with his mother/father.      OBJECTIVE IMPAIRMENTS: Abnormal gait, decreased activity tolerance, decreased balance, decreased coordination, difficulty walking, decreased ROM, decreased strength, increased muscle spasms, impaired flexibility, impaired tone, and postural dysfunction.   ACTIVITY LIMITATIONS: carrying, lifting, bending, standing, squatting, sleeping, stairs, transfers, bed mobility, bathing, toileting, dressing, reach over head, hygiene/grooming, and locomotion level  PARTICIPATION LIMITATIONS: meal prep, cleaning, laundry, shopping, community activity, and church  PERSONAL FACTORS: Age, Past/current experiences, Time since onset of injury/illness/exacerbation, and 3+ comorbidities: Fixed pupils, cerebral thombus with infarction, brain stem herniation, HTN,TBI s/p cranioplasty 2011, VP shunt  are also affecting patient's functional outcome.   REHAB POTENTIAL: Good  CLINICAL DECISION MAKING: Evolving/moderate complexity  EVALUATION COMPLEXITY: Moderate  PLAN:  PT FREQUENCY: 2x/week  PT DURATION: 6 weeks  PLANNED INTERVENTIONS: Therapeutic exercises, Therapeutic activity, Neuromuscular re-education, Balance training, Gait training, Patient/Family education, Self Care, Joint mobilization, Stair training, Vestibular training, Canalith repositioning, DME instructions, Aquatic Therapy, Dry Needling, Electrical stimulation, Cryotherapy, Moist heat,  Taping, Manual therapy, and Re-evaluation  PLAN FOR NEXT SESSION: He is only approved for 3 visits so I'm not sure if you guys are planning to request more.  He is familiar with the pool and comes to [facility] with his dad so I feel like he will only need like 2 more visits to re-est HEP for pool.  R LE spasticity management, aquatic therapy; initiate HEP for LE stretching and balance   AQUATICS: Frequency: 1x Duration: 6 wks Special Instruction: gentle mobility and stretching of the R UE/LE    Harriet Butte, PT, MPT [facility] [address] [place], [place], [address] Phone: [phone]   Fax:  [phone] 02/23/23, 12:12 PM

## 2023-02-23 NOTE — Therapy (Signed)
OUTPATIENT PHYSICAL THERAPY NEURO TREATMENT   Patient Name: Paul Kane MRN: [id number] DOB:[birth year], 37 y.o., male Today's Date: 02/25/2023   PCP: Bailey Mech, PA-C  REFERRING PROVIDER: Ranelle Oyster, MD  END OF SESSION:  PT End of Session - 02/25/23 1154     Visit Number 3    Number of Visits 13    Date for PT Re-Evaluation 03/31/23    Authorization Type BCBS/Medicaid  (approved 3 visits 8/6-8/19)    Authorization Time Period auth submitted on eval    Authorization - Visit Number 2    Authorization - Number of Visits 3    PT Start Time 1100    PT Stop Time 1146    PT Time Calculation (min) 46 min    Equipment Utilized During Treatment Gait belt    Activity Tolerance Patient tolerated treatment well    Behavior During Therapy WFL for tasks assessed/performed              Past Medical History:  Diagnosis Date   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HCC)    Chronic hepatitis B (HCC)    Fixed pupils    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HCC)    Hypertension    Intracranial injury of other and unspecified nature, without mention of open intracranial wound, unspecified state of consciousness    Intracranial shunt    Paralysis (HCC)    Spastic hemiplegia affecting dominant side (HCC)    Stroke (HCC)    Traumatic brain injury (HCC)    Trouble swallowing    Visual disturbance    Weakness    Past Surgical History:  Procedure Laterality Date   cramiectomy     CRANIOPLASTY     REMOVAL OF GASTROSTOMY TUBE     VENTRICULOPERITONEAL SHUNT     Patient Active Problem List   Diagnosis Date Noted   SIRS due to infectious process with acute organ dysfunction (HCC) 03/14/2021   SIRS (systemic inflammatory response syndrome) (HCC) 03/12/2021   Syncope 03/12/2021   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) 03/12/2021   Hypokalemia 03/12/2021   Lactic acidosis 03/12/2021   Mixed hyperlipidemia 04/25/2018   Seizure disorder (HCC) 04/11/2018    Mechanical low back pain 05/12/2017   Dysarthria 12/20/2015   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/20/2012   Deafness, sensorineural 04/20/2012   Buzzing in ear 04/20/2012   Leaking percutaneous endoscopic gastrostomy (PEG) tube (HCC) 02/04/2012   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 pre-existing conscious levels, sequela (HCC) 10/12/2011   Cerebral infarct (HCC) 10/12/2011   Spastic hemiplegia of right dominant side due to noncerebrovascular etiology (HCC) 10/12/2011   Dysphagia 10/12/2011   Functioning G-tube 03/24/2011   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   Hemiplegia of dominant side (HCC) 07/28/2010   CEREBRAL HEMORRHAGE 07/28/2010   PERSONAL HISTORY OF TRAUMATIC BRAIN INJURY 07/02/2010   ALLERGIC RHINITIS CAUSE UNSPECIFIED 10/25/2008   GASTROENTERITIS 08/09/2008   CONTACT DERMATITIS&OTHER ECZEMA DUE DETERGENTS 08/18/2007   HEPATITIS B, CHRONIC 06/28/2007   Chronic viral hepatitis B without delta-agent (HCC) 06/28/2007    ONSET DATE: 2012  REFERRING DIAG: G81.11 (ICD-10-CM) - Spastic hemiplegia of right dominant side due to noncerebrovascular etiology (HCC)  THERAPY DIAG:  Spastic hemiplegia affecting right dominant side, unspecified etiology (HCC)  Other symptoms and signs involving the nervous system  Unsteadiness on feet  Other abnormalities of gait and mobility  Other muscle spasm  Rationale for Evaluation and Treatment: Rehabilitation  SUBJECTIVE:  SUBJECTIVE STATEMENT: Pt is showing ankle brace donned on the R ankle.   Pt accompanied by: family member (mother)  PERTINENT HISTORY: Fixed pupils, cerebral thombus with infarction, brain stem herniation, HTN,TBI s/p cranioplasty, VP shunt  PAIN:  Are you having pain?  No  PRECAUTIONS: Fall and Other: VP shunt  RED FLAGS: None   WEIGHT BEARING RESTRICTIONS: No   FALLS: Has patient fallen in last 6 months? No  LIVING ENVIRONMENT: Lives with: lives with their family Lives in: House/apartment Stairs:  3 steps to enter; 2nd floor with B handrails Has following equipment at home: Wheelchair (manual) and Tour manager; also has [name]  PLOF: Independent with basic ADLs  PATIENT GOALS: improve R ankle mobility and balance   OBJECTIVE:     TODAY'S TREATMENT: 02/25/23 Activity Comments  runner's stretch with stretch out strap 2x30" PT providing AAROM; encouraged mother to make sure pt's knee is extended while wearing Jas brace for max gastroc length   R HS stretch sitting 2x30"  Tolerated well   Sitting fig 4 stretch on R 3x30"  Unable to get into position in sitting, better tolerance in supine. Educated pt on stretch vs. Pain sensation   R step ups on 6" step with 1 UE support 10x  Cueing to increase hip flexion rather than circumduct, safe foot placment; R knee hyperextending towards end of reps d/t fatigue   stair navigation 3x Step-to pattern; best stability with R foot leading  alt toe tap on step R genu recurvatum very visible and cueing for increased hip flexion   Gait training with hinged AFO and heel wedge on R foot 9ft Remaining genu recurvatum evident, cueing to "pick knee up towards the ceiling"    HOME EXERCISE PROGRAM Last updated: 02/25/23 Access Code: [email or web address] URL: [email or web address] Date: 02/25/2023 Prepared by: [facility] Out[facility]  Rehab - [facility]  Exercises - Seated Gastroc Stretch with Strap  - 1 x daily - 5 x weekly - 2 sets - 30  hold - Seated Hamstring Stretch  - 1 x daily - 5 x weekly - 2 sets - 30 sec hold - Supine Figure 4 Piriformis Stretch  - 1 x daily - 5 x weekly - 2 sets - 30 sec hold   PATIENT EDUCATION: Education details: HEP, advised to wear heel wedge for 1 hour  today, 2 hours tomorrow, ect. But to stop if pain occurs and to monitor heel for skin breakdown  Person educated: Patient and Parent Education method: Explanation, Demonstration, Tactile cues, Verbal cues, and Handouts Education comprehension: verbalized understanding and returned demonstration    Below measures were taken at time of initial evaluation unless otherwise specified:   DIAGNOSTIC FINDINGS: none recent   COGNITION: Overall cognitive status: History of cognitive impairments - at baseline   SENSATION: WFL; reports reduced sensation in R UE  COORDINATION: Alternating pronation/supination: unable on R UE d/t stiffness Alternating toe tap: unable to coordinate alt motions     EDEMA:  none  MUSCLE TONE: RLE: mild in L ankle, severe in R ankle   POSTURE:  rounded, R elbow flexed   LOWER EXTREMITY ROM:     Active  Right Eval Left Eval  Hip flexion    Hip extension    Hip abduction    Hip adduction    Hip internal rotation    Hip external rotation    Knee flexion    Knee extension    Ankle dorsiflexion 5 14  Ankle plantarflexion  Ankle inversion    Ankle eversion     (Blank rows = not tested)  LOWER EXTREMITY MMT:    MMT Right Eval Left Eval  Hip flexion 4 4+  Hip extension    Hip abduction 4 4+  Hip adduction 4 4+  Hip internal rotation    Hip external rotation    Knee flexion 4 5  Knee extension 4 5  Ankle dorsiflexion 3+ 4+  Ankle plantarflexion 4 4+  Ankle inversion    Ankle eversion    (Blank rows = not tested)   GAIT: Gait pattern: limited R knee flexion during swing, short step length and foot clearance, visible R ankle inversion tone during swing  Assistive device utilized: None Level of assistance:  HHA from mother   FUNCTIONAL TESTS:  Berg: 41/56    HOME EXERCISE PROGRAM: Not yet initiated   GOALS: Goals reviewed with patient? Yes  SHORT TERM GOALS: Target date: 03/10/2023  Patient to be independent with initial  HEP. Baseline: HEP initiated Goal status: IN PROGRESS    LONG TERM GOALS: Target date: 03/31/2023  Patient to be independent with advanced HEP. Baseline: Not yet initiated  Goal status: IN PROGRESS  Patient to demonstrate at least 8 degrees R ankle dorsiflexion AROM to improve safety with ambulation. Baseline: 5 deg Goal status: IN PROGRESS  Patient/mother to report 50% improvement in wear time/frequency of R AFO for max safety. Baseline: NT Goal status: INITIAL  Patient to demonstrate safe curb navigation with supervision/CGA only.  Baseline: NT Goal status: IN PROGRESS  Patient to ambulate 200 ft safely with supervision.   Baseline: HHA with mother Goal status: IN PROGRESS  Patient to score at least 45/56 on Berg in order to decrease risk of falls.  Baseline: 41 Goal status: IN PROGRESS  ASSESSMENT:  CLINICAL IMPRESSION:  Patient arrived to session with R custom hinged AFO donned on R foot. Stretching for R LE was tolerated well and patient was able to perform majority of this without assistance. Worked on step ups for balance and knee stability. Patient required cues to encourage hip flexion rather than circumduction and R LE appeared to fatigue quickly as evidenced by genu recurvatum worsening with increased reps. Trialed heel wedge in R shoe for hopeful improvement in knee stability- educated pt and mother on this thoroughly who reported understanding. No complaints upon leaving.   OBJECTIVE IMPAIRMENTS: Abnormal gait, decreased activity tolerance, decreased balance, decreased coordination, difficulty walking, decreased ROM, decreased strength, increased muscle spasms, impaired flexibility, impaired tone, and postural dysfunction.   ACTIVITY LIMITATIONS: carrying, lifting, bending, standing, squatting, sleeping, stairs, transfers, bed mobility, bathing, toileting, dressing, reach over head, hygiene/grooming, and locomotion level  PARTICIPATION LIMITATIONS: meal prep,  cleaning, laundry, shopping, community activity, and church  PERSONAL FACTORS: Age, Past/current experiences, Time since onset of injury/illness/exacerbation, and 3+ comorbidities: Fixed pupils, cerebral thombus with infarction, brain stem herniation, HTN,TBI s/p cranioplasty 2011, VP shunt  are also affecting patient's functional outcome.   REHAB POTENTIAL: Good  CLINICAL DECISION MAKING: Evolving/moderate complexity  EVALUATION COMPLEXITY: Moderate  PLAN:  PT FREQUENCY: 2x/week  PT DURATION: 6 weeks  PLANNED INTERVENTIONS: Therapeutic exercises, Therapeutic activity, Neuromuscular re-education, Balance training, Gait training, Patient/Family education, Self Care, Joint mobilization, Stair training, Vestibular training, Canalith repositioning, DME instructions, Aquatic Therapy, Dry Needling, Electrical stimulation, Cryotherapy, Moist heat, Taping, Manual therapy, and Re-evaluation  PLAN FOR NEXT SESSION: medicaid auth on 03/02/23; how did he tolerate heel wedge and is it helping knee stability?  R LE spasticity  management, aquatic therapy; initiate HEP for LE stretching and balance   Per aquatic PT: "He is only approved for 3 visits so I'm not sure if you guys are planning to request more.  He is familiar with the pool and comes to [facility] with his dad so I feel like he will only need like 2 more visits to re-est HEP for pool."  AQUATICS: Frequency: 1x Duration: 6 wks Special Instruction: gentle mobility and stretching of the R UE/LE     Anette Guarneri, PT, DPT 02/25/23 11:57 AM  [facility] at [facility] [address] [place], [place] [address] Phone # [phone] Fax # [phone]

## 2023-02-25 ENCOUNTER — Encounter: Payer: Self-pay | Admitting: Physical Therapy

## 2023-02-25 ENCOUNTER — Ambulatory Visit: Payer: BC Managed Care – PPO | Attending: Physical Medicine & Rehabilitation | Admitting: Physical Therapy

## 2023-02-25 DIAGNOSIS — R2681 Unsteadiness on feet: Secondary | ICD-10-CM | POA: Insufficient documentation

## 2023-02-25 DIAGNOSIS — R262 Difficulty in walking, not elsewhere classified: Secondary | ICD-10-CM | POA: Diagnosis present

## 2023-02-25 DIAGNOSIS — M25641 Stiffness of right hand, not elsewhere classified: Secondary | ICD-10-CM | POA: Insufficient documentation

## 2023-02-25 DIAGNOSIS — G8111 Spastic hemiplegia affecting right dominant side: Secondary | ICD-10-CM | POA: Insufficient documentation

## 2023-02-25 DIAGNOSIS — R2689 Other abnormalities of gait and mobility: Secondary | ICD-10-CM | POA: Diagnosis present

## 2023-02-25 DIAGNOSIS — M6281 Muscle weakness (generalized): Secondary | ICD-10-CM | POA: Insufficient documentation

## 2023-02-25 DIAGNOSIS — M62838 Other muscle spasm: Secondary | ICD-10-CM | POA: Diagnosis present

## 2023-02-25 DIAGNOSIS — R29818 Other symptoms and signs involving the nervous system: Secondary | ICD-10-CM | POA: Insufficient documentation

## 2023-02-25 DIAGNOSIS — R293 Abnormal posture: Secondary | ICD-10-CM | POA: Insufficient documentation

## 2023-02-25 DIAGNOSIS — M25621 Stiffness of right elbow, not elsewhere classified: Secondary | ICD-10-CM | POA: Diagnosis present

## 2023-02-25 DIAGNOSIS — M25611 Stiffness of right shoulder, not elsewhere classified: Secondary | ICD-10-CM | POA: Diagnosis present

## 2023-03-01 ENCOUNTER — Ambulatory Visit: Payer: BC Managed Care – PPO | Admitting: Occupational Therapy

## 2023-03-01 ENCOUNTER — Other Ambulatory Visit: Payer: Self-pay

## 2023-03-01 DIAGNOSIS — M25641 Stiffness of right hand, not elsewhere classified: Secondary | ICD-10-CM

## 2023-03-01 DIAGNOSIS — G8111 Spastic hemiplegia affecting right dominant side: Secondary | ICD-10-CM | POA: Diagnosis not present

## 2023-03-01 DIAGNOSIS — M25621 Stiffness of right elbow, not elsewhere classified: Secondary | ICD-10-CM

## 2023-03-01 DIAGNOSIS — R293 Abnormal posture: Secondary | ICD-10-CM

## 2023-03-01 DIAGNOSIS — M25611 Stiffness of right shoulder, not elsewhere classified: Secondary | ICD-10-CM

## 2023-03-01 NOTE — Therapy (Incomplete)
OUTPATIENT OCCUPATIONAL THERAPY NEURO EVALUATION  Patient Name: Paul Kane MRN: [id number] DOB:08/12/1985, 37 y.o., male Today's Date: 03/01/2023  PCP: Bailey Mech, PA-C  REFERRING PROVIDER: Ranelle Oyster, MD  END OF SESSION:  OT End of Session - 03/01/23 1709     Visit Number 1    Number of Visits 9    Date for OT Re-Evaluation 04/30/23    Authorization Type BCBS / Medicaid-[place] Access 2024    OT Start Time 1152    OT Stop Time 1230    OT Time Calculation (min) 38 min    Behavior During Therapy WFL for tasks assessed/performed             Past Medical History:  Diagnosis Date   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HCC)    Chronic hepatitis B (HCC)    Fixed pupils    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HCC)    Hypertension    Intracranial injury of other and unspecified nature, without mention of open intracranial wound, unspecified state of consciousness    Intracranial shunt    Paralysis (HCC)    Spastic hemiplegia affecting dominant side (HCC)    Stroke (HCC)    Traumatic brain injury (HCC)    Trouble swallowing    Visual disturbance    Weakness    Past Surgical History:  Procedure Laterality Date   cramiectomy     CRANIOPLASTY     REMOVAL OF GASTROSTOMY TUBE     VENTRICULOPERITONEAL SHUNT     Patient Active Problem List   Diagnosis Date Noted   SIRS due to infectious process with acute organ dysfunction (HCC) 03/14/2021   SIRS (systemic inflammatory response syndrome) (HCC) 03/12/2021   Syncope 03/12/2021   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) 03/12/2021   Hypokalemia 03/12/2021   Lactic acidosis 03/12/2021   Mixed hyperlipidemia 04/25/2018   Seizure disorder (HCC) 04/11/2018   Mechanical low back pain 05/12/2017   Dysarthria 12/20/2015   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/20/2012   Deafness, sensorineural 04/20/2012   Buzzing in ear 04/20/2012   Leaking percutaneous endoscopic gastrostomy (PEG) tube (HCC)  02/04/2012   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 pre-existing conscious levels, sequela (HCC) 10/12/2011   Cerebral infarct (HCC) 10/12/2011   Spastic hemiplegia of right dominant side due to noncerebrovascular etiology (HCC) 10/12/2011   Dysphagia 10/12/2011   Functioning G-tube 03/24/2011   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   Hemiplegia of dominant side (HCC) 07/28/2010   CEREBRAL HEMORRHAGE 07/28/2010   PERSONAL HISTORY OF TRAUMATIC BRAIN INJURY 07/02/2010   ALLERGIC RHINITIS CAUSE UNSPECIFIED 10/25/2008   GASTROENTERITIS 08/09/2008   CONTACT DERMATITIS&OTHER ECZEMA DUE DETERGENTS 08/18/2007   HEPATITIS B, CHRONIC 06/28/2007   Chronic viral hepatitis B without delta-agent (HCC) 06/28/2007    ONSET DATE: referral date 02/17/23  REFERRING DIAG: G81.11 (ICD-10-CM) - Spastic hemiplegia affecting right dominant side, unspecified etiology  THERAPY DIAG:  Spastic hemiplegia affecting right dominant side, unspecified etiology (HCC)  Stiffness of right shoulder, not elsewhere classified  Stiffness of right elbow, not elsewhere classified  Stiffness of right hand, not elsewhere classified  Abnormal posture  Rationale for Evaluation and Treatment: Rehabilitation  SUBJECTIVE:   SUBJECTIVE STATEMENT: Pt's mother reports that his arm seems to be tighter and she worries about skin breakdown around his elbow from bent position.  Pt's mother reports that he had a splint made many years ago, but that he no longer wears it and wonders if a  new brace would be appropriate. Pt accompanied by: self and family member (mother)  PERTINENT HISTORY:  1. History of traumatic brain injury with hydrocephalus, meningitis,   and right pontine stroke (2011) 2. Spastic tetraplegia, dominant side more affected  PRECAUTIONS: Fall and Other: h/o seizures  WEIGHT BEARING RESTRICTIONS: No  PAIN:  Are you having pain? Yes: NPRS scale:  "mild"/10 Pain location: R elbow when attempting to straighten Pain description: sore, tight Aggravating factors: movement Relieving factors: movement, massage  FALLS: Has patient fallen in last 6 months? Yes. Number of falls mother reports that "he falls a lot, but not lately"  LIVING ENVIRONMENT: Lives with: lives with their family Lives in: House/apartment Stairs: Yes: Internal: full flight to 2nd floor, but bedroom and bathroom on main floor and does not need to go up steps; can reach both and External: 3 steps; can reach both Has following equipment at home: shower chair and Grab bars  PLOF: Needs assistance with ADLs  PATIENT GOALS: increased use of R arm  OBJECTIVE:   HAND DOMINANCE: Right  ADLs: Mobility: Mod I around the house, hand held assist when in the community Grooming: Mod I  UB Dressing: Mod I LB Dressing: family assists with donning AFO and tying shoes, will assist with setup for clothing on RLE and then pt able to pull up Toileting: Mod I - utilizes bidet for hygiene Bathing: family assists with bathing, he completes ~90% Tub Shower transfers: Supervision/assist with shower transfers Equipment: Shower seat with back, Grab bars, and Walk in shower  IADLs: assists with housekeeping tasks with emptying dishwasher and folding clothes  MOBILITY STATUS: Hx of falls  UPPER EXTREMITY ROM:  noted R scapular winging and decreased scapular elevation  Active ROM Right eval Right PROM  Shoulder flexion 84   Shoulder abduction    Shoulder adduction    Shoulder extension    Shoulder internal rotation    Shoulder external rotation    Elbow flexion 140   Elbow extension -42   Wrist flexion unable 48  Wrist extension 22 35  Wrist ulnar deviation    Wrist radial deviation    Wrist pronation    Wrist supination    (Blank rows = not tested)   HAND FUNCTION: Pt is able to manually open hand with great effort, most active movement is in index  finger  COORDINATION: Unable to assess due to decreased grasp/release  MUSCLE TONE: RUE: RUE: Moderate, Hypertonic, and Modifed Ashworth Scale 3 = Considerable increase in muscle tone, passive movement difficult  COGNITION: Overall cognitive status: Impaired and History of cognitive impairments - at baseline, mother present providing majority of information    TODAY'S TREATMENT:                                                                          DATE:  03/01/23 PROM to R shoulder and elbow with focus on increased ROM Therapist instructed pt and mother in self-ROM HEP to facilitate scapular elevation, retraction, and shoulder flexion and shrugs.  Pt requiring hand over hand to facilitate improved technique and positioning.   PATIENT EDUCATION: Education details: Educated on role and purpose of OT as well as potential interventions and goals for therapy based on  initial evaluation findings. Person educated: Patient and Parent Education method: Explanation, Demonstration, Tactile cues, and Handouts Education comprehension: needs further education  HOME EXERCISE PROGRAM: SRA lab Self-ROM shoulder and scapula exercises   GOALS: Goals reviewed with patient? Yes  SHORT TERM GOALS: Target date: 04/02/23  Pt and caregiver will be independent with HEP for shoulder and elbow ROM. Baseline: Pt not currently engaging in any ROM/stretching program Goal status: INITIAL   Pt will demonstrate improved active elbow extension to -35* to increase functional use Baseline: -42* Goal status: INITIAL   Pt will demonstrate improved shoulder flexion to 90* to increase functional use Baseline: 84* Goal status: INITIAL   LONG TERM GOALS: Target date: 04/30/23  Pt will tolerate advanced HEP/stretching program for RUE. Baseline: No HEP at this time Goal status: INITIAL   Pt will demonstrate improved active elbow extension to -25* to increase functional use Baseline: -42* Goal status:  INITIAL  Pt will demonstrate improved shoulder flexion to 110* to allow for improved ease with ADLs. Baseline: 84* Goal status: INITIAL   4.   Pt will be independent with splint wear and care PRN Baseline: splinting needs TBD Goal status: INITIAL   ASSESSMENT:  CLINICAL IMPRESSION: Patient is a 37 y.o. male who was seen today for occupational therapy evaluation for management of spasticity in dominant RUE. Pt is s/p TBI/CVA in 2011. Pt accompanied by his mother, both express desire to gain some increased mobility and flexibility in R shoulder and elbow.  Pt currently lives with parents who assist him with bathing and dressing tasks as needed. Pt will benefit from skilled occupational therapy services to address ROM, pain management, GM/FM control, cognition, safety awareness, introduction of compensatory strategies/AE prn, and implementation of an HEP to improve participation and safety during ADLs and improved functional ROM.  Marland Kitchen   PERFORMANCE DEFICITS: in functional skills including ADLs, IADLs, coordination, dexterity, tone, ROM, strength, pain, flexibility, FMC, GMC, decreased knowledge of precautions, and UE functional use, cognitive skills including learn, memory, and safety awareness.   IMPAIRMENTS: are limiting patient from ADLs and IADLs.   CO-MORBIDITIES: may have co-morbidities  that affects occupational performance. Patient will benefit from skilled OT to address above impairments and improve overall function.  MODIFICATION OR ASSISTANCE TO COMPLETE EVALUATION: Min-Moderate modification of tasks or assist with assess necessary to complete an evaluation.  OT OCCUPATIONAL PROFILE AND HISTORY: Detailed assessment: Review of records and additional review of physical, cognitive, psychosocial history related to current functional performance.  CLINICAL DECISION MAKING: LOW - limited treatment options, no task modification necessary  REHAB POTENTIAL: Fair ongoing, long term  spasticity  EVALUATION COMPLEXITY: Low    PLAN:  OT FREQUENCY: 1x/week  OT DURATION: 8 weeks  PLANNED INTERVENTIONS: self care/ADL training, therapeutic exercise, therapeutic activity, neuromuscular re-education, manual therapy, passive range of motion, aquatic therapy, splinting, moist heat, cryotherapy, and patient/family education   RECOMMENDED OTHER SERVICES: may benefit from aquatic therapy OT  CONSULTED AND AGREED WITH PLAN OF CARE: Patient and family member/caregiver  PLAN FOR NEXT SESSION: Review HEP and begin PROM and self-ROM for shoulder and elbow ROM.  Assess previous splint and plan to fabricate in future session.  Check all possible CPT codes: 21308 - OT Re-evaluation, 97110- Therapeutic Exercise, [phone]- Neuro Re-education, [phone] - Manual Therapy, 97530 - Therapeutic Activities, [phone] - Self Care, [phone] - Ultrasound, [phone] - Orthotic Fit, and [id number] - Aquatic therapy    Check all conditions that are expected to impact treatment: {Conditions expected to impact treatment:Cognitive  Impairment or Intellectual disability, Contractures, spasticity or fracture relevant to requested treatment, and Neurological condition and/or seizures   If treatment provided at initial evaluation, no treatment charged due to lack of authorization.        Rosalio Loud, OTR/L 03/01/2023, 5:10 PM

## 2023-03-02 ENCOUNTER — Encounter: Payer: Self-pay | Admitting: Physical Therapy

## 2023-03-02 ENCOUNTER — Ambulatory Visit: Payer: BC Managed Care – PPO | Admitting: Physical Therapy

## 2023-03-02 DIAGNOSIS — R293 Abnormal posture: Secondary | ICD-10-CM

## 2023-03-02 DIAGNOSIS — R2681 Unsteadiness on feet: Secondary | ICD-10-CM

## 2023-03-02 DIAGNOSIS — R29818 Other symptoms and signs involving the nervous system: Secondary | ICD-10-CM

## 2023-03-02 DIAGNOSIS — G8111 Spastic hemiplegia affecting right dominant side: Secondary | ICD-10-CM | POA: Diagnosis not present

## 2023-03-02 NOTE — Therapy (Signed)
OUTPATIENT PHYSICAL THERAPY NEURO TREATMENT   Patient Name: Paul Kane MRN: [id number] DOB:03-15-1986, 37 y.o., male Today's Date: 03/02/2023   PCP: Bailey Mech, PA-C  REFERRING PROVIDER: Ranelle Oyster, MD  END OF SESSION:  PT End of Session - 03/02/23 1102     Visit Number 4    Number of Visits 13    Date for PT Re-Evaluation 03/31/23    Authorization Type BCBS/Medicaid  (approved 3 visits 8/6-8/19)    Authorization Time Period reauth submitted at 03/02/2023 visit    Authorization - Visit Number 3    Authorization - Number of Visits 3    PT Start Time 1104    PT Stop Time 1145    PT Time Calculation (min) 41 min    Equipment Utilized During Treatment Gait belt    Activity Tolerance Patient tolerated treatment well    Behavior During Therapy WFL for tasks assessed/performed               Past Medical History:  Diagnosis Date   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HCC)    Chronic hepatitis B (HCC)    Fixed pupils    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HCC)    Hypertension    Intracranial injury of other and unspecified nature, without mention of open intracranial wound, unspecified state of consciousness    Intracranial shunt    Paralysis (HCC)    Spastic hemiplegia affecting dominant side (HCC)    Stroke (HCC)    Traumatic brain injury (HCC)    Trouble swallowing    Visual disturbance    Weakness    Past Surgical History:  Procedure Laterality Date   cramiectomy     CRANIOPLASTY     REMOVAL OF GASTROSTOMY TUBE     VENTRICULOPERITONEAL SHUNT     Patient Active Problem List   Diagnosis Date Noted   SIRS due to infectious process with acute organ dysfunction (HCC) 03/14/2021   SIRS (systemic inflammatory response syndrome) (HCC) 03/12/2021   Syncope 03/12/2021   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) 03/12/2021   Hypokalemia 03/12/2021   Lactic acidosis 03/12/2021   Mixed hyperlipidemia 04/25/2018   Seizure disorder  (HCC) 04/11/2018   Mechanical low back pain 05/12/2017   Dysarthria 12/20/2015   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/20/2012   Deafness, sensorineural 04/20/2012   Buzzing in ear 04/20/2012   Leaking percutaneous endoscopic gastrostomy (PEG) tube (HCC) 02/04/2012   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 pre-existing conscious levels, sequela (HCC) 10/12/2011   Cerebral infarct (HCC) 10/12/2011   Spastic hemiplegia of right dominant side due to noncerebrovascular etiology (HCC) 10/12/2011   Dysphagia 10/12/2011   Functioning G-tube 03/24/2011   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   Hemiplegia of dominant side (HCC) 07/28/2010   CEREBRAL HEMORRHAGE 07/28/2010   PERSONAL HISTORY OF TRAUMATIC BRAIN INJURY 07/02/2010   ALLERGIC RHINITIS CAUSE UNSPECIFIED 10/25/2008   GASTROENTERITIS 08/09/2008   CONTACT DERMATITIS&OTHER ECZEMA DUE DETERGENTS 08/18/2007   HEPATITIS B, CHRONIC 06/28/2007   Chronic viral hepatitis B without delta-agent (HCC) 06/28/2007    ONSET DATE: 2012  REFERRING DIAG: G81.11 (ICD-10-CM) - Spastic hemiplegia of right dominant side due to noncerebrovascular etiology (HCC)  THERAPY DIAG:  Abnormal posture  Other symptoms and signs involving the nervous system  Unsteadiness on feet  Rationale for Evaluation and Treatment: Rehabilitation  SUBJECTIVE:  SUBJECTIVE STATEMENT: The wedge we used last time (per mom) made him feel off balance; we bought Dr. Jari [employer] insert with smaller wedge, that he likes better.Pt is showing ankle brace donned on the R ankle.   Pt accompanied by: family member (mother)  PERTINENT HISTORY: Fixed pupils, cerebral thombus with infarction, brain stem herniation, HTN,TBI s/p cranioplasty, VP shunt  PAIN:  Are  you having pain? No  PRECAUTIONS: Fall and Other: VP shunt  RED FLAGS: None   WEIGHT BEARING RESTRICTIONS: No   FALLS: Has patient fallen in last 6 months? No  LIVING ENVIRONMENT: Lives with: lives with their family Lives in: House/apartment Stairs:  3 steps to enter; 2nd floor with B handrails Has following equipment at home: Wheelchair (manual) and Tour manager; also has [name]  PLOF: Independent with basic ADLs  PATIENT GOALS: improve R ankle mobility and balance   OBJECTIVE:     TODAY'S TREATMENT: 03/02/2023 Activity Comments  Reviewed HEP from last visit: -Gastroc stretch with strap  -Hamstring stretch -figure 4 stretch supine -cues for strap placement (strap from home) and to lessen inversion -good form -good form   Bridging x 20 reps RLE single limb bridging x 20 reps PT holding R foot in position  With R AFO donned: -gait 20 ft x 2, 40 ft x 2 with min guard assist -gait 85 ft x 2 with min guard assist Inversion and supination continue with RLE; mom reports at home she continues use of HHA and he typically doesn't wear brace at home  Alt step taps to 4" step, 8" step with LUE support R knee recurvatum  Forward single limb step ups RLE 2 x 5 reps Manual cues to block R knee recurvatum          HOME EXERCISE PROGRAM Last updated: 02/25/23 Access Code: [email or web address] URL: [email or web address] Date: 02/25/2023 Prepared by: [facility] - Outpatient  Rehab - [facility]  Exercises - Seated Gastroc Stretch with Strap  - 1 x daily - 5 x weekly - 2 sets - 30  hold - Seated Hamstring Stretch  - 1 x daily - 5 x weekly - 2 sets - 30 sec hold - Supine Figure 4 Piriformis Stretch  - 1 x daily - 5 x weekly - 2 sets - 30 sec hold   PATIENT EDUCATION: Education details: Review HEP, advised to wear AFO and their heel wedge for 1 hour today, 2 hours tomorrow, ect. But to stop if pain occurs and to monitor heel for skin breakdown (he is  currently not wearing AFO in home, per mom report); progress towards goals and POC Person educated: Patient and Parent Education method: Explanation, Demonstration, Tactile cues, Verbal cues, and Handouts Education comprehension: verbalized understanding and returned demonstration    Below measures were taken at time of initial evaluation unless otherwise specified:   DIAGNOSTIC FINDINGS: none recent   COGNITION: Overall cognitive status: History of cognitive impairments - at baseline   SENSATION: WFL; reports reduced sensation in R UE  COORDINATION: Alternating pronation/supination: unable on R UE d/t stiffness Alternating toe tap: unable to coordinate alt motions     EDEMA:  none  MUSCLE TONE: RLE: mild in L ankle, severe in R ankle   POSTURE:  rounded, R elbow flexed   LOWER EXTREMITY ROM:     Active  Right Eval Left Eval  Hip flexion    Hip extension    Hip abduction    Hip adduction    Hip internal  rotation    Hip external rotation    Knee flexion    Knee extension    Ankle dorsiflexion 5 14  Ankle plantarflexion    Ankle inversion    Ankle eversion     (Blank rows = not tested)  LOWER EXTREMITY MMT:    MMT Right Eval Left Eval  Hip flexion 4 4+  Hip extension    Hip abduction 4 4+  Hip adduction 4 4+  Hip internal rotation    Hip external rotation    Knee flexion 4 5  Knee extension 4 5  Ankle dorsiflexion 3+ 4+  Ankle plantarflexion 4 4+  Ankle inversion    Ankle eversion    (Blank rows = not tested)   GAIT: Gait pattern: limited R knee flexion during swing, short step length and foot clearance, visible R ankle inversion tone during swing  Assistive device utilized: None Level of assistance:  HHA from mother   FUNCTIONAL TESTS:  Berg: 41/56    HOME EXERCISE PROGRAM: Not yet initiated   GOALS: Goals reviewed with patient? Yes  SHORT TERM GOALS: Target date: 03/10/2023  Patient to be independent with initial HEP. Baseline:  HEP initiated Goal status: MET    LONG TERM GOALS: Target date: 03/31/2023  Patient to be independent with advanced HEP. Baseline: Initial stretching HEP initiated; will need progression of HEP with additional visits 03/02/2023 Goal status: IN PROGRESS  Patient to demonstrate at least 8 degrees R ankle dorsiflexion AROM to improve safety with ambulation. Baseline: 5 deg; 8-10 degrees P/ROM; 2 degrees A/AROM 03/02/2023 Goal status: IN PROGRESS  Patient/mother to report 50% improvement in wear time/frequency of R AFO for max safety. Baseline:wears R ankle brace for outdoors, not in home, per mom report 03/02/2023 Goal status: IN PROGRESS  Patient to demonstrate safe curb navigation with supervision/CGA only.  Baseline:  (stair negotiation with step to pattern; have not attempted curb) 03/02/2023 Goal status: IN PROGRESS  Patient to ambulate 200 ft safely with supervision.   Baseline: HHA with mother at eval; gait 85 ft x 2 with min guard 03/02/2023 Goal status: IN PROGRESS  Patient to score at least 45/56 on Berg in order to decrease risk of falls.  Baseline: 41 at eval Goal status: IN PROGRESS  ASSESSMENT:  CLINICAL IMPRESSION: Assessed STG, with pt meeting STG for HEP.  Pt has initially been given stretches for HEP, and he is demonstrating good return demo and understanding.  Pt's mother is present with patient today, and she reports she notices improvement in his flexibility and his walking.  She does note that he does not wear AFO at home, only outdoors/when out of the home.  Educated today on importance of consistent use and wear of AFO for improved RLE stability.  He did not like the wedge given last visit, as he felt off balance; parents purchased an over the counter wedge and he is tolerating that better.  LTGs are in progress; he is improving with R ankle flexibility and gait distance.  He will continue to benefit from skilled PT towards goals for improved functional mobility and  decreased fall risk.  OBJECTIVE IMPAIRMENTS: Abnormal gait, decreased activity tolerance, decreased balance, decreased coordination, difficulty walking, decreased ROM, decreased strength, increased muscle spasms, impaired flexibility, impaired tone, and postural dysfunction.   ACTIVITY LIMITATIONS: carrying, lifting, bending, standing, squatting, sleeping, stairs, transfers, bed mobility, bathing, toileting, dressing, reach over head, hygiene/grooming, and locomotion level  PARTICIPATION LIMITATIONS: meal prep, cleaning, laundry, shopping, community activity, and  church  PERSONAL FACTORS: Age, Past/current experiences, Time since onset of injury/illness/exacerbation, and 3+ comorbidities: Fixed pupils, cerebral thombus with infarction, brain stem herniation, HTN,TBI s/p cranioplasty 2011, VP shunt  are also affecting patient's functional outcome.   REHAB POTENTIAL: Good  CLINICAL DECISION MAKING: Evolving/moderate complexity  EVALUATION COMPLEXITY: Moderate  PLAN:  PT FREQUENCY: 2x/week  PT DURATION: 6 weeks  PLANNED INTERVENTIONS: Therapeutic exercises, Therapeutic activity, Neuromuscular re-education, Balance training, Gait training, Patient/Family education, Self Care, Joint mobilization, Stair training, Vestibular training, Canalith repositioning, DME instructions, Aquatic Therapy, Dry Needling, Electrical stimulation, Cryotherapy, Moist heat, Taping, Manual therapy, and Re-evaluation  PLAN FOR NEXT SESSION:  medicaid auth *completed* on 03/02/23; Work again on knee stability, check about lower/softer wedge that pt is wearing in R shoe; R LE spasticity management, aquatic therapy;progress HEP for LE stretching and balance   Per aquatic PT: "He is only approved for 3 visits so I'm not sure if you guys are planning to request more.  He is familiar with the pool and comes to [facility] with his dad so I feel like he will only need like 2 more visits to re-est HEP for  pool."  AQUATICS: Frequency: 1x Duration: 6 wks Special Instruction: gentle mobility and stretching of the R UE/LE     Lonia Blood, PT 03/02/23 12:37 PM Phone: [phone] Fax: [phone]  [facility] at [facility] [address] [place], [place] [address] Phone # [phone] Fax # [phone]

## 2023-03-04 ENCOUNTER — Encounter: Payer: BC Managed Care – PPO | Admitting: Occupational Therapy

## 2023-03-04 ENCOUNTER — Ambulatory Visit: Payer: BC Managed Care – PPO | Admitting: Physical Therapy

## 2023-03-04 DIAGNOSIS — G8111 Spastic hemiplegia affecting right dominant side: Secondary | ICD-10-CM

## 2023-03-04 DIAGNOSIS — R2681 Unsteadiness on feet: Secondary | ICD-10-CM

## 2023-03-04 DIAGNOSIS — R29818 Other symptoms and signs involving the nervous system: Secondary | ICD-10-CM

## 2023-03-04 DIAGNOSIS — R293 Abnormal posture: Secondary | ICD-10-CM

## 2023-03-08 ENCOUNTER — Encounter: Payer: Self-pay | Admitting: Physical Therapy

## 2023-03-08 NOTE — Therapy (Signed)
OUTPATIENT PHYSICAL THERAPY NEURO TREATMENT   Patient Name: Paul Kane MRN: [id number] DOB:Feb 22, 1986, 37 y.o., male Today's Date: 03/08/2023   PCP: Bailey Mech, PA-C  REFERRING PROVIDER: Ranelle Oyster, MD  END OF SESSION:    03/04/23 1100  PT Visits / Re-Eval  Visit Number 5  Number of Visits 13  Date for PT Re-Evaluation 03/31/23  Authorization  Authorization Type BCBS/Medicaid  (approved 3 visits 8/6-8/19)  Authorization Time Period reauth submitted at 03/02/2023 visit  Authorization - Visit Number 3  Authorization - Number of Visits 3  PT Time Calculation  PT Start Time 1100  PT Stop Time 1145  PT Time Calculation (min) 45 min  PT - End of Session  Equipment Utilized During Treatment Gait belt  Activity Tolerance Patient tolerated treatment well  Behavior During Therapy WFL for tasks assessed/performed      Past Medical History:  Diagnosis Date   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HCC)    Chronic hepatitis B (HCC)    Fixed pupils    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HCC)    Hypertension    Intracranial injury of other and unspecified nature, without mention of open intracranial wound, unspecified state of consciousness    Intracranial shunt    Paralysis (HCC)    Spastic hemiplegia affecting dominant side (HCC)    Stroke (HCC)    Traumatic brain injury (HCC)    Trouble swallowing    Visual disturbance    Weakness    Past Surgical History:  Procedure Laterality Date   cramiectomy     CRANIOPLASTY     REMOVAL OF GASTROSTOMY TUBE     VENTRICULOPERITONEAL SHUNT     Patient Active Problem List   Diagnosis Date Noted   SIRS due to infectious process with acute organ dysfunction (HCC) 03/14/2021   SIRS (systemic inflammatory response syndrome) (HCC) 03/12/2021   Syncope 03/12/2021   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) 03/12/2021   Hypokalemia 03/12/2021   Lactic acidosis 03/12/2021   Mixed hyperlipidemia  04/25/2018   Seizure disorder (HCC) 04/11/2018   Mechanical low back pain 05/12/2017   Dysarthria 12/20/2015   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/20/2012   Deafness, sensorineural 04/20/2012   Buzzing in ear 04/20/2012   Leaking percutaneous endoscopic gastrostomy (PEG) tube (HCC) 02/04/2012   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 pre-existing conscious levels, sequela (HCC) 10/12/2011   Cerebral infarct (HCC) 10/12/2011   Spastic hemiplegia of right dominant side due to noncerebrovascular etiology (HCC) 10/12/2011   Dysphagia 10/12/2011   Functioning G-tube 03/24/2011   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   Hemiplegia of dominant side (HCC) 07/28/2010   CEREBRAL HEMORRHAGE 07/28/2010   PERSONAL HISTORY OF TRAUMATIC BRAIN INJURY 07/02/2010   ALLERGIC RHINITIS CAUSE UNSPECIFIED 10/25/2008   GASTROENTERITIS 08/09/2008   CONTACT DERMATITIS&OTHER ECZEMA DUE DETERGENTS 08/18/2007   HEPATITIS B, CHRONIC 06/28/2007   Chronic viral hepatitis B without delta-agent (HCC) 06/28/2007    ONSET DATE: 2012  REFERRING DIAG: G81.11 (ICD-10-CM) - Spastic hemiplegia of right dominant side due to noncerebrovascular etiology (HCC)  THERAPY DIAG:  Abnormal posture  Other symptoms and signs involving the nervous system  Unsteadiness on feet  Spastic hemiplegia affecting right dominant side, unspecified etiology (HCC)  Rationale for Evaluation and Treatment: Rehabilitation  SUBJECTIVE:  SUBJECTIVE STATEMENT: Patient presents to aquatics with mom.  Pt accompanied by: family member (mother)  PERTINENT HISTORY: Fixed pupils, cerebral thombus with infarction, brain stem herniation, HTN,TBI s/p cranioplasty, VP shunt  PAIN:  Are you having pain?  No  PRECAUTIONS: Fall and Other: VP shunt  RED FLAGS: None   WEIGHT BEARING RESTRICTIONS: No   FALLS: Has patient fallen in last 6 months? No  LIVING ENVIRONMENT: Lives with: lives with their family Lives in: House/apartment Stairs:  3 steps to enter; 2nd floor with B handrails Has following equipment at home: Wheelchair (manual) and Tour manager; also has [name]  PLOF: Independent with basic ADLs  PATIENT GOALS: improve R ankle mobility and balance   OBJECTIVE:     TODAY'S TREATMENT: 03/04/2023 Aquatic therapy at [facility] - pool temperature 92 degrees   Patient seen for aquatic therapy today.  Treatment took place in water 3.6-4.8 feet deep depending upon activity.  Patient entered and exited the pool via stairs using step-to pattern at [facility] level.   Exercises: -Once at bottom of stairs, pt self initiated calf raises, had him perform 2 rounds of variable reps at his own pace -CGA to help patient into runner's stretch position at bottom of stairs w/ patient holding each LE in this position x1 minute -Water walking forwards, backwards, and laterally from shallow to 4 ft water per pt preference 3 bouts each direction, pt requires repeated redirection to task, CGA-minA (during retro-stepping due to poor RLE hip extension) -STS from bench in 3 ft 6 in water x10 > staggered STS w/ RLE in rear 2x10 -Seated active assist LAQ RLE 2x8 -Long-sitting hamstring stretch BLE x1.5 minutes each using wall and CGA to maintain upright, pt requires redirection to task due to perseveration on his tattoos -Manual adductor stretch in long-sitting, pt has good mobility this direction so task d/c'd -Standing lateral step outs x8 each side w/ mod cuing for using wall support > forward step outs w/ PT limiting step size to prevent LOB -5" step ups forwards and laterally x8 each side each direction using CGA and wall support  Patient requires buoyancy of the water for support for reduced  fall risk with gait training and balance exercises with CGA-minA support. Exercises able to be performed safely in water without the risk of fall compared to those same exercises performed on land; viscosity of water needed for resistance for strengthening. Current of water provides perturbations for challenging static and dynamic balance.     HOME EXERCISE PROGRAM Last updated: 02/25/23 Access Code: [email or web address] URL: [email or web address] Date: 02/25/2023 Prepared by: [facility] - Outpatient  Rehab - [facility]  Exercises - Seated Gastroc Stretch with Strap  - 1 x daily - 5 x weekly - 2 sets - 30  hold - Seated Hamstring Stretch  - 1 x daily - 5 x weekly - 2 sets - 30 sec hold - Supine Figure 4 Piriformis Stretch  - 1 x daily - 5 x weekly - 2 sets - 30 sec hold   PATIENT EDUCATION: Education details: Aquatic exercises and rationale to patient and mom. Person educated: Patient and Parent Education method: Explanation, Demonstration, Tactile cues, Verbal cues, and Handouts Education comprehension: verbalized understanding and returned demonstration    Below measures were taken at time of initial evaluation unless otherwise specified:   DIAGNOSTIC FINDINGS: none recent   COGNITION: Overall cognitive status: History of cognitive impairments - at baseline   SENSATION: WFL; reports reduced sensation in R UE  COORDINATION: Alternating pronation/supination: unable on R UE d/t stiffness Alternating toe tap: unable to coordinate alt motions     EDEMA:  none  MUSCLE TONE: RLE: mild in L ankle, severe in R ankle   POSTURE:  rounded, R elbow flexed   LOWER EXTREMITY ROM:     Active  Right Eval Left Eval  Hip flexion    Hip extension    Hip abduction    Hip adduction    Hip internal rotation    Hip external rotation    Knee flexion    Knee extension    Ankle dorsiflexion 5 14  Ankle plantarflexion    Ankle inversion    Ankle eversion     (Blank rows =  not tested)  LOWER EXTREMITY MMT:    MMT Right Eval Left Eval  Hip flexion 4 4+  Hip extension    Hip abduction 4 4+  Hip adduction 4 4+  Hip internal rotation    Hip external rotation    Knee flexion 4 5  Knee extension 4 5  Ankle dorsiflexion 3+ 4+  Ankle plantarflexion 4 4+  Ankle inversion    Ankle eversion    (Blank rows = not tested)   GAIT: Gait pattern: limited R knee flexion during swing, short step length and foot clearance, visible R ankle inversion tone during swing  Assistive device utilized: None Level of assistance:  HHA from mother   FUNCTIONAL TESTS:  Berg: 41/56    HOME EXERCISE PROGRAM: Not yet initiated   GOALS: Goals reviewed with patient? Yes  SHORT TERM GOALS: Target date: 03/10/2023  Patient to be independent with initial HEP. Baseline: HEP initiated Goal status: MET    LONG TERM GOALS: Target date: 03/31/2023  Patient to be independent with advanced HEP. Baseline: Initial stretching HEP initiated; will need progression of HEP with additional visits 03/02/2023 Goal status: IN PROGRESS  Patient to demonstrate at least 8 degrees R ankle dorsiflexion AROM to improve safety with ambulation. Baseline: 5 deg; 8-10 degrees P/ROM; 2 degrees A/AROM 03/02/2023 Goal status: IN PROGRESS  Patient/mother to report 50% improvement in wear time/frequency of R AFO for max safety. Baseline:wears R ankle brace for outdoors, not in home, per mom report 03/02/2023 Goal status: IN PROGRESS  Patient to demonstrate safe curb navigation with supervision/CGA only.  Baseline:  (stair negotiation with step to pattern; have not attempted curb) 03/02/2023 Goal status: IN PROGRESS  Patient to ambulate 200 ft safely with supervision.   Baseline: HHA with mother at eval; gait 85 ft x 2 with min guard 03/02/2023 Goal status: IN PROGRESS  Patient to score at least 45/56 on Berg in order to decrease risk of falls.  Baseline: 41 at eval Goal status: IN  PROGRESS  ASSESSMENT:  CLINICAL IMPRESSION: Focus of skilled session today on aquatic based NMR focused on RLE.  RLE tone minimally interfered in session with most difficulty redirecting patient to tasks in water for safety.  He continues to benefit from water-based therapy in addition to land based interventions to further address hypertonicity, functional strength, and balance strategies to improve upright mobility.  Will continue per POC.  OBJECTIVE IMPAIRMENTS: Abnormal gait, decreased activity tolerance, decreased balance, decreased coordination, difficulty walking, decreased ROM, decreased strength, increased muscle spasms, impaired flexibility, impaired tone, and postural dysfunction.   ACTIVITY LIMITATIONS: carrying, lifting, bending, standing, squatting, sleeping, stairs, transfers, bed mobility, bathing, toileting, dressing, reach over head, hygiene/grooming, and locomotion level  PARTICIPATION LIMITATIONS: meal prep, cleaning, laundry, shopping, community  activity, and church  PERSONAL FACTORS: Age, Past/current experiences, Time since onset of injury/illness/exacerbation, and 3+ comorbidities: Fixed pupils, cerebral thombus with infarction, brain stem herniation, HTN,TBI s/p cranioplasty 2011, VP shunt  are also affecting patient's functional outcome.   REHAB POTENTIAL: Good  CLINICAL DECISION MAKING: Evolving/moderate complexity  EVALUATION COMPLEXITY: Moderate  PLAN:  PT FREQUENCY: 2x/week  PT DURATION: 6 weeks  PLANNED INTERVENTIONS: Therapeutic exercises, Therapeutic activity, Neuromuscular re-education, Balance training, Gait training, Patient/Family education, Self Care, Joint mobilization, Stair training, Vestibular training, Canalith repositioning, DME instructions, Aquatic Therapy, Dry Needling, Electrical stimulation, Cryotherapy, Moist heat, Taping, Manual therapy, and Re-evaluation  PLAN FOR NEXT SESSION:  medicaid auth *completed* on 03/02/23; Work again on knee  stability, check about lower/softer wedge that pt is wearing in R shoe; R LE spasticity management, aquatic therapy;progress HEP for LE stretching and balance   Per aquatic PT: "He is only approved for 3 visits so I'm not sure if you guys are planning to request more.  He is familiar with the pool and comes to [facility] with his dad so I feel like he will only need like 2 more visits to re-est HEP for pool."  AQUATICS: Frequency: 1x Duration: 6 wks Special Instruction: gentle mobility and stretching of the R UE/LE   Camille Bal, PT, DPT

## 2023-03-09 ENCOUNTER — Ambulatory Visit: Payer: BC Managed Care – PPO | Admitting: Occupational Therapy

## 2023-03-09 ENCOUNTER — Encounter: Payer: Self-pay | Admitting: Occupational Therapy

## 2023-03-09 ENCOUNTER — Ambulatory Visit: Payer: BC Managed Care – PPO

## 2023-03-09 DIAGNOSIS — R2681 Unsteadiness on feet: Secondary | ICD-10-CM

## 2023-03-09 DIAGNOSIS — M25641 Stiffness of right hand, not elsewhere classified: Secondary | ICD-10-CM

## 2023-03-09 DIAGNOSIS — R293 Abnormal posture: Secondary | ICD-10-CM

## 2023-03-09 DIAGNOSIS — M6281 Muscle weakness (generalized): Secondary | ICD-10-CM

## 2023-03-09 DIAGNOSIS — G8111 Spastic hemiplegia affecting right dominant side: Secondary | ICD-10-CM

## 2023-03-09 DIAGNOSIS — R262 Difficulty in walking, not elsewhere classified: Secondary | ICD-10-CM

## 2023-03-09 DIAGNOSIS — M25611 Stiffness of right shoulder, not elsewhere classified: Secondary | ICD-10-CM

## 2023-03-09 DIAGNOSIS — R2689 Other abnormalities of gait and mobility: Secondary | ICD-10-CM

## 2023-03-09 DIAGNOSIS — R29818 Other symptoms and signs involving the nervous system: Secondary | ICD-10-CM

## 2023-03-09 DIAGNOSIS — M25621 Stiffness of right elbow, not elsewhere classified: Secondary | ICD-10-CM

## 2023-03-09 NOTE — Therapy (Signed)
OUTPATIENT PHYSICAL THERAPY NEURO TREATMENT   Patient Name: Paul Kane MRN: [id number] DOB:1986/07/05, 37 y.o., male Today's Date: 03/09/2023   PCP: Paul Mech, PA-C  REFERRING PROVIDER: Ranelle Oyster, MD  END OF SESSION:  PT End of Session - 03/09/23 0934     Visit Number 6    Number of Visits 13    Date for PT Re-Evaluation 03/31/23    Authorization Type BCBS/Medicaid  (approved 3 visits 8/6-8/19)    Authorization Time Period reauth submitted at 03/02/2023 visit    Authorization - Visit Number 2    Authorization - Number of Visits 8    PT Start Time 0930    PT Stop Time 1015    PT Time Calculation (min) 45 min    Equipment Utilized During Treatment Gait belt    Activity Tolerance Patient tolerated treatment well    Behavior During Therapy WFL for tasks assessed/performed                 Past Medical History:  Diagnosis Date   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HCC)    Chronic hepatitis B (HCC)    Fixed pupils    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HCC)    Hypertension    Intracranial injury of other and unspecified nature, without mention of open intracranial wound, unspecified state of consciousness    Intracranial shunt    Paralysis (HCC)    Spastic hemiplegia affecting dominant side (HCC)    Stroke (HCC)    Traumatic brain injury (HCC)    Trouble swallowing    Visual disturbance    Weakness    Past Surgical History:  Procedure Laterality Date   cramiectomy     CRANIOPLASTY     REMOVAL OF GASTROSTOMY TUBE     VENTRICULOPERITONEAL SHUNT     Patient Active Problem List   Diagnosis Date Noted   SIRS due to infectious process with acute organ dysfunction (HCC) 03/14/2021   SIRS (systemic inflammatory response syndrome) (HCC) 03/12/2021   Syncope 03/12/2021   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) 03/12/2021   Hypokalemia 03/12/2021   Lactic acidosis 03/12/2021   Mixed hyperlipidemia 04/25/2018   Seizure  disorder (HCC) 04/11/2018   Mechanical low back pain 05/12/2017   Dysarthria 12/20/2015   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/20/2012   Deafness, sensorineural 04/20/2012   Buzzing in ear 04/20/2012   Leaking percutaneous endoscopic gastrostomy (PEG) tube (HCC) 02/04/2012   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 pre-existing conscious levels, sequela (HCC) 10/12/2011   Cerebral infarct (HCC) 10/12/2011   Spastic hemiplegia of right dominant side due to noncerebrovascular etiology (HCC) 10/12/2011   Dysphagia 10/12/2011   Functioning G-tube 03/24/2011   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   Hemiplegia of dominant side (HCC) 07/28/2010   CEREBRAL HEMORRHAGE 07/28/2010   PERSONAL HISTORY OF TRAUMATIC BRAIN INJURY 07/02/2010   ALLERGIC RHINITIS CAUSE UNSPECIFIED 10/25/2008   GASTROENTERITIS 08/09/2008   CONTACT DERMATITIS&OTHER ECZEMA DUE DETERGENTS 08/18/2007   HEPATITIS B, CHRONIC 06/28/2007   Chronic viral hepatitis B without delta-agent (HCC) 06/28/2007    ONSET DATE: 2012  REFERRING DIAG: G81.11 (ICD-10-CM) - Spastic hemiplegia of right dominant side due to noncerebrovascular etiology (HCC)  THERAPY DIAG:  Abnormal posture  Other symptoms and signs involving the nervous system  Unsteadiness on feet  Other abnormalities of gait and mobility  Difficulty in walking, not elsewhere classified  Rationale for Evaluation and Treatment: Rehabilitation  SUBJECTIVE:  SUBJECTIVE STATEMENT: Patient presents to aquatics with mom.  Pt accompanied by: family member (mother)  PERTINENT HISTORY: Fixed pupils, cerebral thombus with infarction, brain stem herniation, HTN,TBI s/p cranioplasty, VP shunt  PAIN:  Are you having pain? No  PRECAUTIONS: Fall  and Other: VP shunt  RED FLAGS: None   WEIGHT BEARING RESTRICTIONS: No   FALLS: Has patient fallen in last 6 months? No  LIVING ENVIRONMENT: Lives with: lives with their family Lives in: House/apartment Stairs:  3 steps to enter; 2nd floor with B handrails Has following equipment at home: Wheelchair (manual) and Tour manager; also has [name]  PLOF: Independent with basic ADLs  PATIENT GOALS: improve R ankle mobility and balance   OBJECTIVE:   TODAY'S TREATMENT: 03/09/23 Activity Comments  Tall kneeling -hip hinge -trunk twists -eyes closed  Activities for RLE stance phase control   Standing TKE 3x10 RLE 15-20# For quad control  Seated hamstring curls 3x10 10#, sliding foot on floor for feedback  RLE strength -SLR with 10-15 deg extension lag -sidelying hip abd 2+/5--cues to caregiver in techniques for facilitation/activation          TODAY'S TREATMENT: 03/04/2023 Aquatic therapy at [facility] - pool temperature 92 degrees   Patient seen for aquatic therapy today.  Treatment took place in water 3.6-4.8 feet deep depending upon activity.  Patient entered and exited the pool via stairs using step-to pattern at [facility] level.   Exercises: -Once at bottom of stairs, pt self initiated calf raises, had him perform 2 rounds of variable reps at his own pace -CGA to help patient into runner's stretch position at bottom of stairs w/ patient holding each LE in this position x1 minute -Water walking forwards, backwards, and laterally from shallow to 4 ft water per pt preference 3 bouts each direction, pt requires repeated redirection to task, CGA-minA (during retro-stepping due to poor RLE hip extension) -STS from bench in 3 ft 6 in water x10 > staggered STS w/ RLE in rear 2x10 -Seated active assist LAQ RLE 2x8 -Long-sitting hamstring stretch BLE x1.5 minutes each using wall and CGA to maintain upright, pt requires redirection to task due to perseveration on his  tattoos -Manual adductor stretch in long-sitting, pt has good mobility this direction so task d/c'd -Standing lateral step outs x8 each side w/ mod cuing for using wall support > forward step outs w/ PT limiting step size to prevent LOB -5" step ups forwards and laterally x8 each side each direction using CGA and wall support  Patient requires buoyancy of the water for support for reduced fall risk with gait training and balance exercises with CGA-minA support. Exercises able to be performed safely in water without the risk of fall compared to those same exercises performed on land; viscosity of water needed for resistance for strengthening. Current of water provides perturbations for challenging static and dynamic balance.     HOME EXERCISE PROGRAM Last updated: 02/25/23 Access Code: [email or web address] URL: [email or web address] Date: 02/25/2023 Prepared by: [facility] - Outpatient  Rehab - [facility]  Exercises - Seated Gastroc Stretch with Strap  - 1 x daily - 5 x weekly - 2 sets - 30  hold - Seated Hamstring Stretch  - 1 x daily - 5 x weekly - 2 sets - 30 sec hold - Supine Figure 4 Piriformis Stretch  - 1 x daily - 5 x weekly - 2 sets - 30 sec hold   PATIENT EDUCATION: Education details: Aquatic exercises and rationale to  patient and mom. Person educated: Patient and Parent Education method: Explanation, Demonstration, Tactile cues, Verbal cues, and Handouts Education comprehension: verbalized understanding and returned demonstration    Below measures were taken at time of initial evaluation unless otherwise specified:   DIAGNOSTIC FINDINGS: none recent   COGNITION: Overall cognitive status: History of cognitive impairments - at baseline   SENSATION: WFL; reports reduced sensation in R UE  COORDINATION: Alternating pronation/supination: unable on R UE d/t stiffness Alternating toe tap: unable to coordinate alt motions     EDEMA:  none  MUSCLE TONE: RLE:  mild in L ankle, severe in R ankle   POSTURE:  rounded, R elbow flexed   LOWER EXTREMITY ROM:     Active  Right Eval Left Eval  Hip flexion    Hip extension    Hip abduction    Hip adduction    Hip internal rotation    Hip external rotation    Knee flexion    Knee extension    Ankle dorsiflexion 5 14  Ankle plantarflexion    Ankle inversion    Ankle eversion     (Blank rows = not tested)  LOWER EXTREMITY MMT:    MMT Right Eval Left Eval  Hip flexion 4 4+  Hip extension    Hip abduction 4 4+  Hip adduction 4 4+  Hip internal rotation    Hip external rotation    Knee flexion 4 5  Knee extension 4 5  Ankle dorsiflexion 3+ 4+  Ankle plantarflexion 4 4+  Ankle inversion    Ankle eversion    (Blank rows = not tested)   GAIT: Gait pattern: limited R knee flexion during swing, short step length and foot clearance, visible R ankle inversion tone during swing  Assistive device utilized: None Level of assistance:  HHA from mother   FUNCTIONAL TESTS:  Berg: 41/56    HOME EXERCISE PROGRAM: Not yet initiated   GOALS: Goals reviewed with patient? Yes  SHORT TERM GOALS: Target date: 03/10/2023  Patient to be independent with initial HEP. Baseline: HEP initiated Goal status: MET    LONG TERM GOALS: Target date: 03/31/2023  Patient to be independent with advanced HEP. Baseline: Initial stretching HEP initiated; will need progression of HEP with additional visits 03/02/2023 Goal status: IN PROGRESS  Patient to demonstrate at least 8 degrees R ankle dorsiflexion AROM to improve safety with ambulation. Baseline: 5 deg; 8-10 degrees P/ROM; 2 degrees A/AROM 03/02/2023 Goal status: IN PROGRESS  Patient/mother to report 50% improvement in wear time/frequency of R AFO for max safety. Baseline:wears R ankle brace for outdoors, not in home, per mom report 03/02/2023 Goal status: IN PROGRESS  Patient to demonstrate safe curb navigation with supervision/CGA only.   Baseline:  (stair negotiation with step to pattern; have not attempted curb) 03/02/2023 Goal status: IN PROGRESS  Patient to ambulate 200 ft safely with supervision.   Baseline: HHA with mother at eval; gait 85 ft x 2 with min guard 03/02/2023 Goal status: IN PROGRESS  Patient to score at least 45/56 on Berg in order to decrease risk of falls.  Baseline: 41 at eval Goal status: IN PROGRESS  ASSESSMENT:  CLINICAL IMPRESSION: Techniques to facilitate motor control with initial position of tall kneeling on mat for symmetric Wbing through pelvis w/ tactile cues.  Forced weight to RLE for stance phase control with therapist controling ankle to prevent inversion. Isolation techniques to RLE for muscular recruitment and facilitation. Demo weakness in right hip flexors and  rectus femoris with 10-15 deg extension lag with SLR and 2+ to 3-/5 right hip abduction. Continued sessions to progress motor control and RLE strength  OBJECTIVE IMPAIRMENTS: Abnormal gait, decreased activity tolerance, decreased balance, decreased coordination, difficulty walking, decreased ROM, decreased strength, increased muscle spasms, impaired flexibility, impaired tone, and postural dysfunction.   ACTIVITY LIMITATIONS: carrying, lifting, bending, standing, squatting, sleeping, stairs, transfers, bed mobility, bathing, toileting, dressing, reach over head, hygiene/grooming, and locomotion level  PARTICIPATION LIMITATIONS: meal prep, cleaning, laundry, shopping, community activity, and church  PERSONAL FACTORS: Age, Past/current experiences, Time since onset of injury/illness/exacerbation, and 3+ comorbidities: Fixed pupils, cerebral thombus with infarction, brain stem herniation, HTN,TBI s/p cranioplasty 2011, VP shunt  are also affecting patient's functional outcome.   REHAB POTENTIAL: Good  CLINICAL DECISION MAKING: Evolving/moderate complexity  EVALUATION COMPLEXITY: Moderate  PLAN:  PT FREQUENCY: 2x/week  PT  DURATION: 6 weeks  PLANNED INTERVENTIONS: Therapeutic exercises, Therapeutic activity, Neuromuscular re-education, Balance training, Gait training, Patient/Family education, Self Care, Joint mobilization, Stair training, Vestibular training, Canalith repositioning, DME instructions, Aquatic Therapy, Dry Needling, Electrical stimulation, Cryotherapy, Moist heat, Taping, Manual therapy, and Re-evaluation  PLAN FOR NEXT SESSION:  medicaid auth *completed* on 03/02/23; Work again on knee stability, check about lower/softer wedge that pt is wearing in R shoe; R LE spasticity management, aquatic therapy;progress HEP for LE stretching and balance   Per aquatic PT: "He is only approved for 3 visits so I'm not sure if you guys are planning to request more.  He is familiar with the pool and comes to [facility] with his dad so I feel like he will only need like 2 more visits to re-est HEP for pool."  AQUATICS: Frequency: 1x Duration: 6 wks Special Instruction: gentle mobility and stretching of the R UE/LE   10:33 AM, 03/09/23 M. Shary Decamp, PT, DPT Physical Therapist- [place] Office Number: [phone]

## 2023-03-09 NOTE — Therapy (Signed)
OUTPATIENT OCCUPATIONAL THERAPY NEURO TREATMENT  Patient Name: Paul Kane MRN: [id number] DOB:Apr 18, 1986, 37 y.o., male Today's Date: 03/09/2023  PCP: Bailey Mech, PA-C  REFERRING PROVIDER: Ranelle Oyster, MD  END OF SESSION:  OT End of Session - 03/09/23 [phone]     Visit Number 2    Number of Visits 9    Date for OT Re-Evaluation 04/30/23    Authorization Type BCBS / Medicaid-[place] Access 2024    OT Start Time 0850    OT Stop Time 0930    OT Time Calculation (min) 40 min    Activity Tolerance Patient tolerated treatment well    Behavior During Therapy WFL for tasks assessed/performed             Past Medical History:  Diagnosis Date   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HCC)    Chronic hepatitis B (HCC)    Fixed pupils    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HCC)    Hypertension    Intracranial injury of other and unspecified nature, without mention of open intracranial wound, unspecified state of consciousness    Intracranial shunt    Paralysis (HCC)    Spastic hemiplegia affecting dominant side (HCC)    Stroke (HCC)    Traumatic brain injury (HCC)    Trouble swallowing    Visual disturbance    Weakness    Past Surgical History:  Procedure Laterality Date   cramiectomy     CRANIOPLASTY     REMOVAL OF GASTROSTOMY TUBE     VENTRICULOPERITONEAL SHUNT     Patient Active Problem List   Diagnosis Date Noted   SIRS due to infectious process with acute organ dysfunction (HCC) 03/14/2021   SIRS (systemic inflammatory response syndrome) (HCC) 03/12/2021   Syncope 03/12/2021   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) 03/12/2021   Hypokalemia 03/12/2021   Lactic acidosis 03/12/2021   Mixed hyperlipidemia 04/25/2018   Seizure disorder (HCC) 04/11/2018   Mechanical low back pain 05/12/2017   Dysarthria 12/20/2015   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/20/2012   Deafness, sensorineural 04/20/2012   Buzzing in ear 04/20/2012   Leaking  percutaneous endoscopic gastrostomy (PEG) tube (HCC) 02/04/2012   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 pre-existing conscious levels, sequela (HCC) 10/12/2011   Cerebral infarct (HCC) 10/12/2011   Spastic hemiplegia of right dominant side due to noncerebrovascular etiology (HCC) 10/12/2011   Dysphagia 10/12/2011   Functioning G-tube 03/24/2011   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   Hemiplegia of dominant side (HCC) 07/28/2010   CEREBRAL HEMORRHAGE 07/28/2010   PERSONAL HISTORY OF TRAUMATIC BRAIN INJURY 07/02/2010   ALLERGIC RHINITIS CAUSE UNSPECIFIED 10/25/2008   GASTROENTERITIS 08/09/2008   CONTACT DERMATITIS&OTHER ECZEMA DUE DETERGENTS 08/18/2007   HEPATITIS B, CHRONIC 06/28/2007   Chronic viral hepatitis B without delta-agent (HCC) 06/28/2007    ONSET DATE: referral date 02/17/23  REFERRING DIAG: G81.11 (ICD-10-CM) - Spastic hemiplegia affecting right dominant side, unspecified etiology  THERAPY DIAG:  Stiffness of right shoulder, not elsewhere classified  Stiffness of right elbow, not elsewhere classified  Stiffness of right hand, not elsewhere classified  Muscle weakness (generalized)  Spastic hemiplegia affecting right dominant side, unspecified etiology (HCC)  Rationale for Evaluation and Treatment: Rehabilitation  SUBJECTIVE:   SUBJECTIVE STATEMENT: Pt reports no pain at beginning of session.  Patient with loss of balance in waiting room in meeting new clinician.   Pt accompanied by: self and family member (father)  PERTINENT HISTORY:  1.  History of traumatic brain injury with hydrocephalus, meningitis,   and right pontine stroke (2011) 2. Spastic tetraplegia, dominant side more affected  PRECAUTIONS: Fall and Other: h/o seizures  WEIGHT BEARING RESTRICTIONS: No  PAIN:  Are you having pain? Yes: NPRS scale: "mild"/10 Pain location: R elbow when attempting to straighten Pain description: sore,  tight Aggravating factors: movement Relieving factors: movement, massage  FALLS: Has patient fallen in last 6 months? Yes. Number of falls mother reports that "he falls a lot, but not lately"  LIVING ENVIRONMENT: Lives with: lives with their family Lives in: House/apartment Stairs: Yes: Internal: full flight to 2nd floor, but bedroom and bathroom on main floor and does not need to go up steps; can reach both and External: 3 steps; can reach both Has following equipment at home: shower chair and Grab bars  PLOF: Needs assistance with ADLs  PATIENT GOALS: increased use of R arm  OBJECTIVE:   HAND DOMINANCE: Right  ADLs: Mobility: Mod I around the house, hand held assist when in the community Grooming: Mod I  UB Dressing: Mod I LB Dressing: family assists with donning AFO and tying shoes, will assist with setup for clothing on RLE and then pt able to pull up Toileting: Mod I - utilizes bidet for hygiene Bathing: family assists with bathing, he completes ~90% Tub Shower transfers: Supervision/assist with shower transfers Equipment: Shower seat with back, Grab bars, and Walk in shower  IADLs: assists with housekeeping tasks with emptying dishwasher and folding clothes  MOBILITY STATUS: Hx of falls  UPPER EXTREMITY ROM:  noted R scapular winging and decreased scapular elevation  Active ROM Right eval Right PROM  Shoulder flexion 84   Shoulder abduction    Shoulder adduction    Shoulder extension    Shoulder internal rotation    Shoulder external rotation    Elbow flexion 140   Elbow extension -42   Wrist flexion unable 48  Wrist extension 22 35  Wrist ulnar deviation    Wrist radial deviation    Wrist pronation    Wrist supination    (Blank rows = not tested)   HAND FUNCTION: Pt is able to manually open hand with great effort, most active movement is in index finger  COORDINATION: Unable to assess due to decreased grasp/release  MUSCLE TONE: RUE: RUE: Moderate,  Hypertonic, and Modifed Ashworth Scale 3 = Considerable increase in muscle tone, passive movement difficult  COGNITION: Overall cognitive status: Impaired and History of cognitive impairments - at baseline, mother present providing majority of information    TODAY'S TREATMENT:                                                                          DATE:  03/09/23: Patient walked back from waiting room with min assist - dad holding onto patient.  Patient bright and pleasant throughout session.  Patient with increased tension in right elbow/ shoulder after walking into clinic.  Patient unable to release tension initially in elbow - ~ 90* elbow flexion/extension.   Reviewed HEP scapular motions.    Patient positioned in R sidelying.  Immediately displayed increase elbow extension. Taught patient gentle self range of elbow in sidelying.  Patient achieved nearly full elbow  extension (-15 degrees)   Worked sidelying toward supine with right arm stable in ~ 45* abduction to use body on arm to increase stretch to anterior chest/ shoulder girdle.    Returned to sitting and worked on reaching with gravity downward toward AFO.  Patient able to touch top strap of brace.  Reviewed three exercises and patient able to repeat.  Father present entire session.      03/01/23 PROM to R shoulder and elbow with focus on increased ROM Therapist instructed pt and mother in self-ROM HEP to facilitate scapular elevation, retraction, and shoulder flexion and shrugs.  Pt requiring hand over hand to facilitate improved technique and positioning.   PATIENT EDUCATION: Education details: Educated on role and purpose of OT as well as potential interventions and goals for therapy based on initial evaluation findings. Person educated: Patient and Parent Education method: Explanation, Demonstration, Tactile cues, and Handouts Education comprehension: needs further education  HOME EXERCISE PROGRAM: SRA lab Self-ROM  shoulder and scapula exercises   GOALS: Goals reviewed with patient? Yes  SHORT TERM GOALS: Target date: 04/02/23  Pt and caregiver will be independent with HEP for shoulder and elbow ROM. Baseline: Pt not currently engaging in any ROM/stretching program Goal status: ONGOING   Pt will demonstrate improved active elbow extension to -35* to increase functional use Baseline: -42* Goal status: ONGOING   Pt will demonstrate improved shoulder flexion to 90* to increase functional use Baseline: 84* Goal status: ONGOING   LONG TERM GOALS: Target date: 04/30/23  Pt will tolerate advanced HEP/stretching program for RUE. Baseline: No HEP at this time Goal status: ONGOING   Pt will demonstrate improved active elbow extension to -25* to increase functional use Baseline: -42* Goal status: ONGOING  Pt will demonstrate improved shoulder flexion to 110* to allow for improved ease with ADLs. Baseline: 84* Goal status: ONGOING   4.   Pt will be independent with splint wear and care PRN Baseline: splinting needs TBD Goal status: ONGOING   ASSESSMENT:  CLINICAL IMPRESSION: Patient is a 37 y.o. male who was seen today for occupational therapy evaluation for management of spasticity in dominant RUE. Pt is s/p TBI/CVA in 2011. Pt accompanied by his mother, both express desire to gain some increased mobility and flexibility in R shoulder and elbow.  Pt currently lives with parents who assist him with bathing and dressing tasks as needed. Pt will benefit from skilled occupational therapy services to address ROM, pain management, GM/FM control, cognition, safety awareness, introduction of compensatory strategies/AE prn, and implementation of an HEP to improve participation and safety during ADLs and improved functional ROM.  Marland Kitchen   PERFORMANCE DEFICITS: in functional skills including ADLs, IADLs, coordination, dexterity, tone, ROM, strength, pain, flexibility, FMC, GMC, decreased knowledge of  precautions, and UE functional use, cognitive skills including learn, memory, and safety awareness.   IMPAIRMENTS: are limiting patient from ADLs and IADLs.   CO-MORBIDITIES: may have co-morbidities  that affects occupational performance. Patient will benefit from skilled OT to address above impairments and improve overall function.  MODIFICATION OR ASSISTANCE TO COMPLETE EVALUATION: Min-Moderate modification of tasks or assist with assess necessary to complete an evaluation.  OT OCCUPATIONAL PROFILE AND HISTORY: Detailed assessment: Review of records and additional review of physical, cognitive, psychosocial history related to current functional performance.  CLINICAL DECISION MAKING: LOW - limited treatment options, no task modification necessary  REHAB POTENTIAL: Fair ongoing, long term spasticity  EVALUATION COMPLEXITY: Low    PLAN:  OT FREQUENCY: 1x/week  OT DURATION: 8 weeks  PLANNED INTERVENTIONS: self care/ADL training, therapeutic exercise, therapeutic activity, neuromuscular re-education, manual therapy, passive range of motion, aquatic therapy, splinting, moist heat, cryotherapy, and patient/family education   RECOMMENDED OTHER SERVICES: may benefit from aquatic therapy OT  CONSULTED AND AGREED WITH PLAN OF CARE: Patient and family member/caregiver  PLAN FOR NEXT SESSION: Review HEP and begin PROM and self-ROM for shoulder and elbow ROM.  Assess previous splint and plan to fabricate in future session.  Check all possible CPT codes: 52841 - OT Re-evaluation, 97110- Therapeutic Exercise, [phone]- Neuro Re-education, [phone] - Manual Therapy, 97530 - Therapeutic Activities, [phone] - Self Care, [phone] - Ultrasound, [phone] - Orthotic Fit, and [phone] - Aquatic therapy    Check all conditions that are expected to impact treatment: {Conditions expected to impact treatment:Cognitive Impairment or Intellectual disability, Contractures, spasticity or fracture relevant to requested  treatment, and Neurological condition and/or seizures   If treatment provided at initial evaluation, no treatment charged due to lack of authorization.        Collier Salina, OTR/L 03/09/2023, 9:39 AM

## 2023-03-11 ENCOUNTER — Ambulatory Visit: Payer: BC Managed Care – PPO | Admitting: Physical Therapy

## 2023-03-11 DIAGNOSIS — G8111 Spastic hemiplegia affecting right dominant side: Secondary | ICD-10-CM

## 2023-03-11 DIAGNOSIS — R293 Abnormal posture: Secondary | ICD-10-CM

## 2023-03-11 DIAGNOSIS — M6281 Muscle weakness (generalized): Secondary | ICD-10-CM

## 2023-03-12 ENCOUNTER — Encounter: Payer: Self-pay | Admitting: Physical Therapy

## 2023-03-12 NOTE — Therapy (Signed)
OUTPATIENT PHYSICAL THERAPY NEURO TREATMENT   Patient Name: Paul Kane MRN: [id number] DOB:[birth year], 37 y.o., male Today's Date: 03/12/2023   PCP: Bailey Mech, PA-C  REFERRING PROVIDER: Ranelle Oyster, MD  END OF SESSION:   03/11/23 0942  PT Visits / Re-Eval  Visit Number 7  Number of Visits 13  Date for PT Re-Evaluation 03/31/23  Authorization  Authorization Type BCBS/Medicaid  (approved 3 visits 8/6-8/19)  Authorization Time Period reauth submitted at 03/02/2023 visit  Authorization - Visit Number 3  Authorization - Number of Visits 8  PT Time Calculation  PT Start Time [phone] (Patient arrives late and has to remove AFO, shoes and shirt prior to entering pool)  PT Stop Time 1015  PT Time Calculation (min) 33 min  PT - End of Session  Equipment Utilized During Treatment Gait belt, 2.5 lb ankle weight  Activity Tolerance Patient tolerated treatment well  Behavior During Therapy WFL for tasks assessed/performed    Past Medical History:  Diagnosis Date   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HCC)    Chronic hepatitis B (HCC)    Fixed pupils    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HCC)    Hypertension    Intracranial injury of other and unspecified nature, without mention of open intracranial wound, unspecified state of consciousness    Intracranial shunt    Paralysis (HCC)    Spastic hemiplegia affecting dominant side (HCC)    Stroke (HCC)    Traumatic brain injury (HCC)    Trouble swallowing    Visual disturbance    Weakness    Past Surgical History:  Procedure Laterality Date   cramiectomy     CRANIOPLASTY     REMOVAL OF GASTROSTOMY TUBE     VENTRICULOPERITONEAL SHUNT     Patient Active Problem List   Diagnosis Date Noted   SIRS due to infectious process with acute organ dysfunction (HCC) 03/14/2021   SIRS (systemic inflammatory response syndrome) (HCC) 03/12/2021   Syncope 03/12/2021   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) 03/12/2021   Hypokalemia 03/12/2021   Lactic acidosis 03/12/2021   Mixed hyperlipidemia 04/25/2018   Seizure disorder (HCC) 04/11/2018   Mechanical low back pain 05/12/2017   Dysarthria 12/20/2015   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/20/2012   Deafness, sensorineural 04/20/2012   Buzzing in ear 04/20/2012   Leaking percutaneous endoscopic gastrostomy (PEG) tube (HCC) 02/04/2012   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 pre-existing conscious levels, sequela (HCC) 10/12/2011   Cerebral infarct (HCC) 10/12/2011   Spastic hemiplegia of right dominant side due to noncerebrovascular etiology (HCC) 10/12/2011   Dysphagia 10/12/2011   Functioning G-tube 03/24/2011   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   Hemiplegia of dominant side (HCC) 07/28/2010   CEREBRAL HEMORRHAGE 07/28/2010   PERSONAL HISTORY OF TRAUMATIC BRAIN INJURY 07/02/2010   ALLERGIC RHINITIS CAUSE UNSPECIFIED 10/25/2008   GASTROENTERITIS 08/09/2008   CONTACT DERMATITIS&OTHER ECZEMA DUE DETERGENTS 08/18/2007   HEPATITIS B, CHRONIC 06/28/2007   Chronic viral hepatitis B without delta-agent (HCC) 06/28/2007    ONSET DATE: 2012  REFERRING DIAG: G81.11 (ICD-10-CM) - Spastic hemiplegia of right dominant side due to noncerebrovascular etiology (HCC)  THERAPY DIAG:  Spastic hemiplegia affecting right dominant side, unspecified etiology (HCC)  Muscle weakness (generalized)  Abnormal posture  Rationale for Evaluation and Treatment: Rehabilitation  SUBJECTIVE:  SUBJECTIVE STATEMENT: Patient presents to aquatics with dad.  Pt accompanied by: family member (mother)  PERTINENT HISTORY: Fixed pupils, cerebral thombus with infarction, brain stem herniation, HTN,TBI s/p cranioplasty, VP  shunt  PAIN:  Are you having pain? No  PRECAUTIONS: Fall and Other: VP shunt  RED FLAGS: None   WEIGHT BEARING RESTRICTIONS: No   FALLS: Has patient fallen in last 6 months? No  LIVING ENVIRONMENT: Lives with: lives with their family Lives in: House/apartment Stairs:  3 steps to enter; 2nd floor with B handrails Has following equipment at home: Wheelchair (manual) and Tour manager; also has [name]  PLOF: Independent with basic ADLs  PATIENT GOALS: improve R ankle mobility and balance   OBJECTIVE:   TODAY'S TREATMENT: 03/11/2023 Aquatic therapy at [facility] - pool temperature 92 degrees   Patient seen for aquatic therapy today.  Treatment took place in water 3.6-4.0 feet deep depending upon activity.  Patient entered and exited the pool via stairs using step-to pattern at [facility] level.   Exercises: Warmup: Water walking w/ CGA forwards, backwards, and laterally 4x18 ft each direction  -Runners stretch at wall in 3 ft 6 inch water x4 variable seconds based on patient's attention each rep -Standing hip abduction focused on maintained SLS x10 2.5 lb right ankle weight -Standing hip extension x10 w/ 2.5 lb right ankle weight  -Standing march x10 w/ 2.5 lb right ankle weight  -tried hamstring stretch on stairs, but pt does lunges forward lunges several reps with unsuccessful redirection -Seated on bench in short sitting PT performs PROM to RLE for hamstring stretch -Seated LAQ w/ AAROM at end of flexion ROM x20 w/ 2.5 lb ankle weight -STS from bench w/ LLE march in standing   Patient requires buoyancy of the water for support for reduced fall risk with gait training and balance exercises with CGA-minA support. Exercises able to be performed safely in water without the risk of fall compared to those same exercises performed on land; viscosity of water needed for resistance for strengthening. Current of water provides perturbations for challenging static and dynamic  balance.     HOME EXERCISE PROGRAM Last updated: 02/25/23 Access Code: [email or web address] URL: [email or web address] Date: 02/25/2023 Prepared by: [facility] - Outpatient  Rehab - [facility]  Exercises - Seated Gastroc Stretch with Strap  - 1 x daily - 5 x weekly - 2 sets - 30  hold - Seated Hamstring Stretch  - 1 x daily - 5 x weekly - 2 sets - 30 sec hold - Supine Figure 4 Piriformis Stretch  - 1 x daily - 5 x weekly - 2 sets - 30 sec hold   PATIENT EDUCATION: Education details: Frequent redirection to task. Person educated: Patient and Parent Education method: Explanation, Demonstration, Tactile cues, Verbal cues, and Handouts Education comprehension: verbalized understanding and returned demonstration    Below measures were taken at time of initial evaluation unless otherwise specified:   DIAGNOSTIC FINDINGS: none recent   COGNITION: Overall cognitive status: History of cognitive impairments - at baseline   SENSATION: WFL; reports reduced sensation in R UE  COORDINATION: Alternating pronation/supination: unable on R UE d/t stiffness Alternating toe tap: unable to coordinate alt motions     EDEMA:  none  MUSCLE TONE: RLE: mild in L ankle, severe in R ankle   POSTURE:  rounded, R elbow flexed   LOWER EXTREMITY ROM:     Active  Right Eval Left Eval  Hip flexion    Hip extension  Hip abduction    Hip adduction    Hip internal rotation    Hip external rotation    Knee flexion    Knee extension    Ankle dorsiflexion 5 14  Ankle plantarflexion    Ankle inversion    Ankle eversion     (Blank rows = not tested)  LOWER EXTREMITY MMT:    MMT Right Eval Left Eval  Hip flexion 4 4+  Hip extension    Hip abduction 4 4+  Hip adduction 4 4+  Hip internal rotation    Hip external rotation    Knee flexion 4 5  Knee extension 4 5  Ankle dorsiflexion 3+ 4+  Ankle plantarflexion 4 4+  Ankle inversion    Ankle eversion    (Blank rows = not  tested)   GAIT: Gait pattern: limited R knee flexion during swing, short step length and foot clearance, visible R ankle inversion tone during swing  Assistive device utilized: None Level of assistance:  HHA from mother   FUNCTIONAL TESTS:  Berg: 41/56    HOME EXERCISE PROGRAM: Not yet initiated   GOALS: Goals reviewed with patient? Yes  SHORT TERM GOALS: Target date: 03/10/2023  Patient to be independent with initial HEP. Baseline: HEP initiated Goal status: MET    LONG TERM GOALS: Target date: 03/31/2023  Patient to be independent with advanced HEP. Baseline: Initial stretching HEP initiated; will need progression of HEP with additional visits 03/02/2023 Goal status: IN PROGRESS  Patient to demonstrate at least 8 degrees R ankle dorsiflexion AROM to improve safety with ambulation. Baseline: 5 deg; 8-10 degrees P/ROM; 2 degrees A/AROM 03/02/2023 Goal status: IN PROGRESS  Patient/mother to report 50% improvement in wear time/frequency of R AFO for max safety. Baseline:wears R ankle brace for outdoors, not in home, per mom report 03/02/2023 Goal status: IN PROGRESS  Patient to demonstrate safe curb navigation with supervision/CGA only.  Baseline:  (stair negotiation with step to pattern; have not attempted curb) 03/02/2023 Goal status: IN PROGRESS  Patient to ambulate 200 ft safely with supervision.   Baseline: HHA with mother at eval; gait 85 ft x 2 with min guard 03/02/2023 Goal status: IN PROGRESS  Patient to score at least 45/56 on Berg in order to decrease risk of falls.  Baseline: 41 at eval Goal status: IN PROGRESS  ASSESSMENT:  CLINICAL IMPRESSION: Focus of aquatic therapy today on continued tone management and improved RLE ROM.  He appeared to have more notable spasticity today and required more frequent redirection to tasks due to high distractibility in pool environment.  He continues to benefit from aquatic intervention to address deficits outlined in POC  in lower fall risk environment.  OBJECTIVE IMPAIRMENTS: Abnormal gait, decreased activity tolerance, decreased balance, decreased coordination, difficulty walking, decreased ROM, decreased strength, increased muscle spasms, impaired flexibility, impaired tone, and postural dysfunction.   ACTIVITY LIMITATIONS: carrying, lifting, bending, standing, squatting, sleeping, stairs, transfers, bed mobility, bathing, toileting, dressing, reach over head, hygiene/grooming, and locomotion level  PARTICIPATION LIMITATIONS: meal prep, cleaning, laundry, shopping, community activity, and church  PERSONAL FACTORS: Age, Past/current experiences, Time since onset of injury/illness/exacerbation, and 3+ comorbidities: Fixed pupils, cerebral thombus with infarction, brain stem herniation, HTN,TBI s/p cranioplasty 2011, VP shunt  are also affecting patient's functional outcome.   REHAB POTENTIAL: Good  CLINICAL DECISION MAKING: Evolving/moderate complexity  EVALUATION COMPLEXITY: Moderate  PLAN:  PT FREQUENCY: 2x/week  PT DURATION: 6 weeks  PLANNED INTERVENTIONS: Therapeutic exercises, Therapeutic activity, Neuromuscular re-education, Balance training, Gait  training, Patient/Family education, Self Care, Joint mobilization, Stair training, Vestibular training, Canalith repositioning, DME instructions, Aquatic Therapy, Dry Needling, Electrical stimulation, Cryotherapy, Moist heat, Taping, Manual therapy, and Re-evaluation  PLAN FOR NEXT SESSION:  medicaid auth *completed* on 03/02/23; Work again on knee stability, check about lower/softer wedge that pt is wearing in R shoe; R LE spasticity management, aquatic therapy;progress HEP for LE stretching and balance   Per aquatic PT: Are we doing an HEP and is caregiver willing to get in pool with him for safety as mom stated previously he cannot get his head wet?  AQUATICS: Frequency: 1x Duration: 6 wks Special Instruction: gentle mobility and stretching of the R  UE/LE   5:48 PM, 03/12/23 Camille Bal, PT, DPT

## 2023-03-15 ENCOUNTER — Ambulatory Visit: Payer: BC Managed Care – PPO | Admitting: Occupational Therapy

## 2023-03-15 DIAGNOSIS — G8111 Spastic hemiplegia affecting right dominant side: Secondary | ICD-10-CM

## 2023-03-15 DIAGNOSIS — M25621 Stiffness of right elbow, not elsewhere classified: Secondary | ICD-10-CM

## 2023-03-15 DIAGNOSIS — M25611 Stiffness of right shoulder, not elsewhere classified: Secondary | ICD-10-CM

## 2023-03-15 DIAGNOSIS — M25641 Stiffness of right hand, not elsewhere classified: Secondary | ICD-10-CM

## 2023-03-15 DIAGNOSIS — M6281 Muscle weakness (generalized): Secondary | ICD-10-CM

## 2023-03-15 NOTE — Therapy (Signed)
OUTPATIENT OCCUPATIONAL THERAPY NEURO TREATMENT  Patient Name: Paul Kane MRN: [id number] DOB:[birth year], 37 y.o., male Today's Date: 03/15/2023  PCP: Bailey Mech, PA-C  REFERRING PROVIDER: Ranelle Oyster, MD  END OF SESSION:  OT End of Session - 03/15/23 0854     Visit Number 3    Number of Visits 9    Date for OT Re-Evaluation 04/30/23    Authorization Type BCBS / Medicaid-[place] Access 2024    OT Start Time 0848    OT Stop Time 0930    OT Time Calculation (min) 42 min    Activity Tolerance Patient tolerated treatment well    Behavior During Therapy WFL for tasks assessed/performed              Past Medical History:  Diagnosis Date   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HCC)    Chronic hepatitis B (HCC)    Fixed pupils    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HCC)    Hypertension    Intracranial injury of other and unspecified nature, without mention of open intracranial wound, unspecified state of consciousness    Intracranial shunt    Paralysis (HCC)    Spastic hemiplegia affecting dominant side (HCC)    Stroke (HCC)    Traumatic brain injury (HCC)    Trouble swallowing    Visual disturbance    Weakness    Past Surgical History:  Procedure Laterality Date   cramiectomy     CRANIOPLASTY     REMOVAL OF GASTROSTOMY TUBE     VENTRICULOPERITONEAL SHUNT     Patient Active Problem List   Diagnosis Date Noted   SIRS due to infectious process with acute organ dysfunction (HCC) 03/14/2021   SIRS (systemic inflammatory response syndrome) (HCC) 03/12/2021   Syncope 03/12/2021   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) 03/12/2021   Hypokalemia 03/12/2021   Lactic acidosis 03/12/2021   Mixed hyperlipidemia 04/25/2018   Seizure disorder (HCC) 04/11/2018   Mechanical low back pain 05/12/2017   Dysarthria 12/20/2015   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/20/2012   Deafness, sensorineural 04/20/2012   Buzzing in ear 04/20/2012   Leaking  percutaneous endoscopic gastrostomy (PEG) tube (HCC) 02/04/2012   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 pre-existing conscious levels, sequela (HCC) 10/12/2011   Cerebral infarct (HCC) 10/12/2011   Spastic hemiplegia of right dominant side due to noncerebrovascular etiology (HCC) 10/12/2011   Dysphagia 10/12/2011   Functioning G-tube 03/24/2011   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   Hemiplegia of dominant side (HCC) 07/28/2010   CEREBRAL HEMORRHAGE 07/28/2010   PERSONAL HISTORY OF TRAUMATIC BRAIN INJURY 07/02/2010   ALLERGIC RHINITIS CAUSE UNSPECIFIED 10/25/2008   GASTROENTERITIS 08/09/2008   CONTACT DERMATITIS&OTHER ECZEMA DUE DETERGENTS 08/18/2007   HEPATITIS B, CHRONIC 06/28/2007   Chronic viral hepatitis B without delta-agent (HCC) 06/28/2007    ONSET DATE: referral date 02/17/23  REFERRING DIAG: G81.11 (ICD-10-CM) - Spastic hemiplegia affecting right dominant side, unspecified etiology  THERAPY DIAG:  Muscle weakness (generalized)  Spastic hemiplegia affecting right dominant side, unspecified etiology (HCC)  Stiffness of right shoulder, not elsewhere classified  Stiffness of right elbow, not elsewhere classified  Stiffness of right hand, not elsewhere classified  Rationale for Evaluation and Treatment: Rehabilitation  SUBJECTIVE:   SUBJECTIVE STATEMENT: Pt reports "I hate the pool". Pt accompanied by: self and family member (father)  PERTINENT HISTORY:  1. History of traumatic brain injury with hydrocephalus, meningitis,   and right pontine stroke (2011) 2.  Spastic tetraplegia, dominant side more affected  PRECAUTIONS: Fall and Other: h/o seizures  WEIGHT BEARING RESTRICTIONS: No  PAIN:  Are you having pain? No  FALLS: Has patient fallen in last 6 months? Yes. Number of falls mother reports that "he falls a lot, but not lately"  LIVING ENVIRONMENT: Lives with: lives with their family Lives in:  House/apartment Stairs: Yes: Internal: full flight to 2nd floor, but bedroom and bathroom on main floor and does not need to go up steps; can reach both and External: 3 steps; can reach both Has following equipment at home: shower chair and Grab bars  PLOF: Needs assistance with ADLs  PATIENT GOALS: increased use of R arm  OBJECTIVE:   HAND DOMINANCE: Right  ADLs: Mobility: Mod I around the house, hand held assist when in the community Grooming: Mod I  UB Dressing: Mod I LB Dressing: family assists with donning AFO and tying shoes, will assist with setup for clothing on RLE and then pt able to pull up Toileting: Mod I - utilizes bidet for hygiene Bathing: family assists with bathing, he completes ~90% Tub Shower transfers: Supervision/assist with shower transfers Equipment: Shower seat with back, Grab bars, and Walk in shower  IADLs: assists with housekeeping tasks with emptying dishwasher and folding clothes  MOBILITY STATUS: Hx of falls  UPPER EXTREMITY ROM:  noted R scapular winging and decreased scapular elevation  Active ROM Right eval Right PROM  Shoulder flexion 84   Shoulder abduction    Shoulder adduction    Shoulder extension    Shoulder internal rotation    Shoulder external rotation    Elbow flexion 140   Elbow extension -42   Wrist flexion unable 48  Wrist extension 22 35  Wrist ulnar deviation    Wrist radial deviation    Wrist pronation    Wrist supination    (Blank rows = not tested)   HAND FUNCTION: Pt is able to manually open hand with great effort, most active movement is in index finger  COORDINATION: Unable to assess due to decreased grasp/release  MUSCLE TONE: RUE: RUE: Moderate, Hypertonic, and Modifed Ashworth Scale 3 = Considerable increase in muscle tone, passive movement difficult  COGNITION: Overall cognitive status: Impaired and History of cognitive impairments - at baseline, mother present providing majority of  information    TODAY'S TREATMENT:                                                                          DATE:  03/15/23 Elbow ROM: Engaged in reaching towards floor with sustained stretch and extension, utilizing thigh for bracing and L hand at wrist to further facilitate stretch.  OT providing encouraging to hold position 15-30 seconds for prolonged stretch. UE ROM: engaged in WB through LUE over half ball to facilitate finger extension, wrist extension, and elbow flexion/extension while reaching across midline with LUE to further facilitate WB.  OT providing support at R hand and elbow to facilitate extension when reaching outside body to place items on L.  OT providing tactile cues and therapeutic touch to relax thumb IP hyperextension.  Progressed to elbow flexion/extension and shoulder flexion with use of cane.  OT providing facilitation at elbow and hand to  maintain grasp and full ROM as tolerated. Wrist flexion/extension: engaged in PROM with therapist providing stretch through wrist flexion and extension.  Pt demonstrating increased tightness with wrist flexion, compared to extension.  OT also facilitating supination stretch, however extremely tight with little ROM.    03/09/23: Patient walked back from waiting room with min assist - dad holding onto patient.  Patient bright and pleasant throughout session.  Patient with increased tension in right elbow/ shoulder after walking into clinic.  Patient unable to release tension initially in elbow - ~ 90* elbow flexion/extension.   Reviewed HEP scapular motions.    Patient positioned in R sidelying.  Immediately displayed increase elbow extension. Taught patient gentle self range of elbow in sidelying.  Patient achieved nearly full elbow extension (-15 degrees)   Worked sidelying toward supine with right arm stable in ~ 45* abduction to use body on arm to increase stretch to anterior chest/ shoulder girdle.    Returned to sitting and worked  on reaching with gravity downward toward AFO.  Patient able to touch top strap of brace.  Reviewed three exercises and patient able to repeat.  Father present entire session.      03/01/23 PROM to R shoulder and elbow with focus on increased ROM Therapist instructed pt and mother in self-ROM HEP to facilitate scapular elevation, retraction, and shoulder flexion and shrugs.  Pt requiring hand over hand to facilitate improved technique and positioning.   PATIENT EDUCATION: Education details: Educated on role and purpose of OT as well as potential interventions and goals for therapy based on initial evaluation findings. Person educated: Patient and Parent Education method: Explanation, Demonstration, Tactile cues, and Handouts Education comprehension: needs further education  HOME EXERCISE PROGRAM: SRA lab Self-ROM shoulder and scapula exercises  Access Code: [email or web address] URL: [email or web address] Date: 03/15/2023 Prepared by: [facility] - Outpatient  Rehab - [facility]    GOALS: Goals reviewed with patient? Yes  SHORT TERM GOALS: Target date: 04/02/23  Pt and caregiver will be independent with HEP for shoulder and elbow ROM. Baseline: Pt not currently engaging in any ROM/stretching program Goal status: ONGOING   Pt will demonstrate improved active elbow extension to -35* to increase functional use Baseline: -42* Goal status: ONGOING   Pt will demonstrate improved shoulder flexion to 90* to increase functional use Baseline: 84* Goal status: ONGOING   LONG TERM GOALS: Target date: 04/30/23  Pt will tolerate advanced HEP/stretching program for RUE. Baseline: No HEP at this time Goal status: ONGOING   Pt will demonstrate improved active elbow extension to -25* to increase functional use Baseline: -42* Goal status: ONGOING  Pt will demonstrate improved shoulder flexion to 110* to allow for improved ease with ADLs. Baseline: 84* Goal status: ONGOING   4.    Pt will be independent with splint wear and care PRN Baseline: splinting needs TBD Goal status: ONGOING   ASSESSMENT:  CLINICAL IMPRESSION: Pt demonstrating good tolerance to wrist flexion and elbow extension.  Good use of gravity and sustained stretch for elbow extension this session.  PERFORMANCE DEFICITS: in functional skills including ADLs, IADLs, coordination, dexterity, tone, ROM, strength, pain, flexibility, FMC, GMC, decreased knowledge of precautions, and UE functional use, cognitive skills including learn, memory, and safety awareness.    PLAN:  OT FREQUENCY: 1x/week  OT DURATION: 8 weeks  PLANNED INTERVENTIONS: self care/ADL training, therapeutic exercise, therapeutic activity, neuromuscular re-education, manual therapy, passive range of motion, aquatic therapy, splinting, moist heat, cryotherapy, and patient/family education   RECOMMENDED OTHER  SERVICES: may benefit from aquatic therapy OT  CONSULTED AND AGREED WITH PLAN OF CARE: Patient and family member/caregiver  PLAN FOR NEXT SESSION: Review HEP and begin PROM and self-ROM for shoulder and elbow ROM.  Assess previous splint and plan to fabricate in future session.    Rosalio Loud, OTR/L 03/15/2023, 8:54 AM

## 2023-03-16 ENCOUNTER — Encounter: Payer: Self-pay | Admitting: Rehabilitation

## 2023-03-16 ENCOUNTER — Ambulatory Visit: Payer: BC Managed Care – PPO | Admitting: Rehabilitation

## 2023-03-16 DIAGNOSIS — G8111 Spastic hemiplegia affecting right dominant side: Secondary | ICD-10-CM | POA: Diagnosis not present

## 2023-03-16 DIAGNOSIS — R2681 Unsteadiness on feet: Secondary | ICD-10-CM

## 2023-03-16 DIAGNOSIS — M6281 Muscle weakness (generalized): Secondary | ICD-10-CM

## 2023-03-16 DIAGNOSIS — R29818 Other symptoms and signs involving the nervous system: Secondary | ICD-10-CM

## 2023-03-16 DIAGNOSIS — R2689 Other abnormalities of gait and mobility: Secondary | ICD-10-CM

## 2023-03-16 NOTE — Therapy (Signed)
OUTPATIENT PHYSICAL THERAPY NEURO TREATMENT   Patient Name: Paul Kane MRN: [id number] DOB:Dec 03, 1985, 37 y.o., male Today's Date: 03/16/2023   PCP: Bailey Mech, PA-C  REFERRING PROVIDER: Ranelle Oyster, MD  END OF SESSION:  PT End of Session - 03/16/23 0806     Visit Number 8    Number of Visits 13    Date for PT Re-Evaluation 03/31/23    Authorization Type BCBS/Medicaid  (approved 8 visits 8/20-9/16)    Authorization Time Period reauth submitted at 03/02/2023 visit    Authorization - Visit Number 4    Authorization - Number of Visits 8    Equipment Utilized During Treatment Gait belt;Other (comment)   floatation devices as needed for safety   Activity Tolerance Patient tolerated treatment well    Behavior During Therapy [facility] for tasks assessed/performed             PT End of Session - 03/16/23 0806     Visit Number 8    Number of Visits 13    Date for PT Re-Evaluation 03/31/23    Authorization Type BCBS/Medicaid  (approved 8 visits 8/20-9/16)    Authorization Time Period reauth submitted at 03/02/2023 visit    Authorization - Visit Number 4    Authorization - Number of Visits 8    Equipment Utilized During Treatment Gait belt;Other (comment)   floatation devices as needed for safety   Activity Tolerance Patient tolerated treatment well    Behavior During Therapy WFL for tasks assessed/performed              Past Medical History:  Diagnosis Date   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HCC)    Chronic hepatitis B (HCC)    Fixed pupils    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HCC)    Hypertension    Intracranial injury of other and unspecified nature, without mention of open intracranial wound, unspecified state of consciousness    Intracranial shunt    Paralysis (HCC)    Spastic hemiplegia affecting dominant side (HCC)    Stroke (HCC)    Traumatic brain injury (HCC)    Trouble swallowing    Visual disturbance    Weakness    Past  Surgical History:  Procedure Laterality Date   cramiectomy     CRANIOPLASTY     REMOVAL OF GASTROSTOMY TUBE     VENTRICULOPERITONEAL SHUNT     Patient Active Problem List   Diagnosis Date Noted   SIRS due to infectious process with acute organ dysfunction (HCC) 03/14/2021   SIRS (systemic inflammatory response syndrome) (HCC) 03/12/2021   Syncope 03/12/2021   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) 03/12/2021   Hypokalemia 03/12/2021   Lactic acidosis 03/12/2021   Mixed hyperlipidemia 04/25/2018   Seizure disorder (HCC) 04/11/2018   Mechanical low back pain 05/12/2017   Dysarthria 12/20/2015   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/20/2012   Deafness, sensorineural 04/20/2012   Buzzing in ear 04/20/2012   Leaking percutaneous endoscopic gastrostomy (PEG) tube (HCC) 02/04/2012   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 pre-existing conscious levels, sequela (HCC) 10/12/2011   Cerebral infarct (HCC) 10/12/2011   Spastic hemiplegia of right dominant side due to noncerebrovascular etiology (HCC) 10/12/2011   Dysphagia 10/12/2011   Functioning G-tube 03/24/2011   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   Hemiplegia of dominant side (HCC) 07/28/2010   CEREBRAL HEMORRHAGE 07/28/2010   PERSONAL HISTORY OF TRAUMATIC BRAIN INJURY 07/02/2010   ALLERGIC RHINITIS CAUSE  UNSPECIFIED 10/25/2008   GASTROENTERITIS 08/09/2008   CONTACT DERMATITIS&OTHER ECZEMA DUE DETERGENTS 08/18/2007   HEPATITIS B, CHRONIC 06/28/2007   Chronic viral hepatitis B without delta-agent (HCC) 06/28/2007    ONSET DATE: 2012  REFERRING DIAG: G81.11 (ICD-10-CM) - Spastic hemiplegia of right dominant side due to noncerebrovascular etiology (HCC)  THERAPY DIAG:  Muscle weakness (generalized)  Spastic hemiplegia affecting right dominant side, unspecified etiology (HCC)  Other symptoms and signs involving the nervous system  Unsteadiness on feet  Other  abnormalities of gait and mobility  Rationale for Evaluation and Treatment: Rehabilitation  SUBJECTIVE:                                                                                                                                                                                             SUBJECTIVE STATEMENT: Patient presents to aquatics with dad.  PT discussed with pts dad that next aquatic session would be last pool session and that if he could get in with patient in order to go over exercises he can do with him in the pool as they have a membership at [employer].  Pts dad verbalized understanding.   Pt accompanied by: family member (father)  PERTINENT HISTORY: Fixed pupils, cerebral thombus with infarction, brain stem herniation, HTN,TBI s/p cranioplasty, VP shunt  PAIN:  Are you having pain? No  PRECAUTIONS: Fall and Other: VP shunt  RED FLAGS: None   WEIGHT BEARING RESTRICTIONS: No   FALLS: Has patient fallen in last 6 months? No  LIVING ENVIRONMENT: Lives with: lives with their family Lives in: House/apartment Stairs:  3 steps to enter; 2nd floor with B handrails Has following equipment at home: Wheelchair (manual) and Tour manager; also has [name]  PLOF: Independent with basic ADLs  PATIENT GOALS: improve R ankle mobility and balance   OBJECTIVE:   TODAY'S TREATMENT: 03/11/2023 Aquatic therapy at [facility] - pool temperature 92 degrees   Patient seen for aquatic therapy today.  Treatment took place in water 3.6-4.0 feet deep depending upon activity.  Patient entered and exited the pool via stairs using step-to pattern at [facility] level.   Warm up:  With LUE HHA from PT.  Forwards walking x approx 18' x 4 laps, backwards walking x 4 laps, side stepping x 4 laps with cues for posture and slow controlled movements.    Side stepping with squats x 2 laps with B HHA from PT with cues for slower motion and ensuring he had stepping prior to doing squat.       NMR:  Sit<>stand with feet on small step, sitting on pool bench.  PT maintaining  position of R foot/LE during transitions along with facilitating improved R lateral weight shift x 20 reps with cues for slow movement, esp with descent.  Squats (trying to touch buttocks to bench then return to stand) x 20 reps again with cues for slow controlled movement.   Forward lunges with HHA on pts LUE x 4 laps.  Only min guiding cues on technique but pt demonstrating much improved technique after 2 laps.  Forward step ups with LUE support on wall x 20 reps, randomly switching feet with PT providing light facilitation for increased forward weight shift.  Forward marching x 2 laps, with cues and light facilitation for improved forward weight shift over RLE during stance.    Pt willing to attempt tall kneel and half kneel exercises today.  PT placed blue ankle weight under knees which allowed him to tolerate better.  Some shin pain after half kneel but overall did very well with all exercises with min to mod A (mostly for stabilizing RLE):  L hip ext x 10 reps, L hip abd x 10 reps.  RLE does extend out from under him a couple times but able to get back into position with assist.  In L half kneel (R knee on step), had him perform LUE flex/ext x 10 reps.  PT assisted with placing R hand around yellow barbell (he is able to hold L barbell), pushing barbells away from body and back (chest press) and then holding in front of him and rotating trunk laterally x 20 reps.     Patient requires buoyancy of the water for support for reduced fall risk with gait training and balance exercises with CGA-minA support. Exercises able to be performed safely in water without the risk of fall compared to those same exercises performed on land; viscosity of water needed for resistance for strengthening. Current of water provides perturbations for challenging static and dynamic balance.     HOME EXERCISE PROGRAM Last updated: 02/25/23 Access  Code: [email or web address] URL: [email or web address] Date: 02/25/2023 Prepared by: [facility] - Outpatient  Rehab - [facility]  Exercises - Seated Gastroc Stretch with Strap  - 1 x daily - 5 x weekly - 2 sets - 30  hold - Seated Hamstring Stretch  - 1 x daily - 5 x weekly - 2 sets - 30 sec hold - Supine Figure 4 Piriformis Stretch  - 1 x daily - 5 x weekly - 2 sets - 30 sec hold   PATIENT EDUCATION: Education details: Frequent redirection to task. Person educated: Patient and Parent Education method: Explanation, Demonstration, Tactile cues, Verbal cues, and Handouts Education comprehension: verbalized understanding and returned demonstration    Below measures were taken at time of initial evaluation unless otherwise specified:   DIAGNOSTIC FINDINGS: none recent   COGNITION: Overall cognitive status: History of cognitive impairments - at baseline   SENSATION: WFL; reports reduced sensation in R UE  COORDINATION: Alternating pronation/supination: unable on R UE d/t stiffness Alternating toe tap: unable to coordinate alt motions     EDEMA:  none  MUSCLE TONE: RLE: mild in L ankle, severe in R ankle   POSTURE:  rounded, R elbow flexed   LOWER EXTREMITY ROM:     Active  Right Eval Left Eval  Hip flexion    Hip extension    Hip abduction    Hip adduction    Hip internal rotation    Hip external rotation    Knee flexion    Knee extension  Ankle dorsiflexion 5 14  Ankle plantarflexion    Ankle inversion    Ankle eversion     (Blank rows = not tested)  LOWER EXTREMITY MMT:    MMT Right Eval Left Eval  Hip flexion 4 4+  Hip extension    Hip abduction 4 4+  Hip adduction 4 4+  Hip internal rotation    Hip external rotation    Knee flexion 4 5  Knee extension 4 5  Ankle dorsiflexion 3+ 4+  Ankle plantarflexion 4 4+  Ankle inversion    Ankle eversion    (Blank rows = not tested)   GAIT: Gait pattern: limited R knee flexion during swing,  short step length and foot clearance, visible R ankle inversion tone during swing  Assistive device utilized: None Level of assistance:  HHA from mother   FUNCTIONAL TESTS:  Berg: 41/56    HOME EXERCISE PROGRAM: Not yet initiated   GOALS: Goals reviewed with patient? Yes  SHORT TERM GOALS: Target date: 03/10/2023  Patient to be independent with initial HEP. Baseline: HEP initiated Goal status: MET    LONG TERM GOALS: Target date: 03/31/2023  Patient to be independent with advanced HEP. Baseline: Initial stretching HEP initiated; will need progression of HEP with additional visits 03/02/2023 Goal status: IN PROGRESS  Patient to demonstrate at least 8 degrees R ankle dorsiflexion AROM to improve safety with ambulation. Baseline: 5 deg; 8-10 degrees P/ROM; 2 degrees A/AROM 03/02/2023 Goal status: IN PROGRESS  Patient/mother to report 50% improvement in wear time/frequency of R AFO for max safety. Baseline:wears R ankle brace for outdoors, not in home, per mom report 03/02/2023 Goal status: IN PROGRESS  Patient to demonstrate safe curb navigation with supervision/CGA only.  Baseline:  (stair negotiation with step to pattern; have not attempted curb) 03/02/2023 Goal status: IN PROGRESS  Patient to ambulate 200 ft safely with supervision.   Baseline: HHA with mother at eval; gait 85 ft x 2 with min guard 03/02/2023 Goal status: IN PROGRESS  Patient to score at least 45/56 on Berg in order to decrease risk of falls.  Baseline: 41 at eval Goal status: IN PROGRESS  ASSESSMENT:  CLINICAL IMPRESSION: Pt able to tolerate tall kneel and half kneel positions today in order to work on proximal stabilization and R lateral weight shifting/weight bearing.  Dad present for session observing from pool deck.  PT recommended he come to next session and get in pool as that would be last pool session as they have been going to the pool and have a [employer].  Dad verbalized  understanding.    OBJECTIVE IMPAIRMENTS: Abnormal gait, decreased activity tolerance, decreased balance, decreased coordination, difficulty walking, decreased ROM, decreased strength, increased muscle spasms, impaired flexibility, impaired tone, and postural dysfunction.   ACTIVITY LIMITATIONS: carrying, lifting, bending, standing, squatting, sleeping, stairs, transfers, bed mobility, bathing, toileting, dressing, reach over head, hygiene/grooming, and locomotion level  PARTICIPATION LIMITATIONS: meal prep, cleaning, laundry, shopping, community activity, and church  PERSONAL FACTORS: Age, Past/current experiences, Time since onset of injury/illness/exacerbation, and 3+ comorbidities: Fixed pupils, cerebral thombus with infarction, brain stem herniation, HTN,TBI s/p cranioplasty 2011, VP shunt  are also affecting patient's functional outcome.   REHAB POTENTIAL: Good  CLINICAL DECISION MAKING: Evolving/moderate complexity  EVALUATION COMPLEXITY: Moderate  PLAN:  PT FREQUENCY: 2x/week  PT DURATION: 6 weeks  PLANNED INTERVENTIONS: Therapeutic exercises, Therapeutic activity, Neuromuscular re-education, Balance training, Gait training, Patient/Family education, Self Care, Joint mobilization, Stair training, Vestibular training, Canalith repositioning, DME instructions, Aquatic  Therapy, Dry Needling, Electrical stimulation, Cryotherapy, Moist heat, Taping, Manual therapy, and Re-evaluation  PLAN FOR NEXT SESSION:  medicaid auth *completed* on 03/02/23; Work again on knee stability, check about lower/softer wedge that pt is wearing in R shoe; R LE spasticity management, aquatic therapy;progress HEP for LE stretching and balance   Per aquatic PT: From Irving Burton:: I have been told by Tomma Lightning that his dad takes him to the pool regularly and they have gone over exercises to do in the pool.  I have not met the dad yet, but perhaps you all can confirm this and I would say he could wrap up next week from an  aquatic standpoint.   AQUATICS: Frequency: 1x Duration: 6 wks Special Instruction: gentle mobility and stretching of the R UE/LE   Harriet Butte, PT, MPT [facility] [address] [place], [place], [address] Phone: [phone]   Fax:  [phone] 03/16/23, 8:07 AM

## 2023-03-18 ENCOUNTER — Ambulatory Visit: Payer: BC Managed Care – PPO

## 2023-03-18 DIAGNOSIS — G8111 Spastic hemiplegia affecting right dominant side: Secondary | ICD-10-CM

## 2023-03-18 DIAGNOSIS — R29818 Other symptoms and signs involving the nervous system: Secondary | ICD-10-CM

## 2023-03-18 DIAGNOSIS — R2689 Other abnormalities of gait and mobility: Secondary | ICD-10-CM

## 2023-03-18 DIAGNOSIS — R2681 Unsteadiness on feet: Secondary | ICD-10-CM

## 2023-03-18 DIAGNOSIS — M6281 Muscle weakness (generalized): Secondary | ICD-10-CM

## 2023-03-18 NOTE — Therapy (Signed)
OUTPATIENT PHYSICAL THERAPY NEURO TREATMENT   Patient Name: Paul Kane MRN: [id number] DOB:1986-05-13, 37 y.o., male Today's Date: 03/18/2023   PCP: Bailey Mech, PA-C  REFERRING PROVIDER: Ranelle Oyster, MD  END OF SESSION:  PT End of Session - 03/18/23 1101     Visit Number 9    Number of Visits 13    Date for PT Re-Evaluation 03/31/23    Authorization Type BCBS/Medicaid  (approved 8 visits 8/20-9/16)    Authorization Time Period reauth submitted at 03/02/2023 visit    Authorization - Visit Number 5    Authorization - Number of Visits 8    PT Start Time 1100    PT Stop Time 1145    PT Time Calculation (min) 45 min    Equipment Utilized During Treatment Gait belt;Other (comment)   floatation devices as needed for safety   Activity Tolerance Patient tolerated treatment well    Behavior During Therapy [facility] for tasks assessed/performed             PT End of Session - 03/18/23 1101     Visit Number 9    Number of Visits 13    Date for PT Re-Evaluation 03/31/23    Authorization Type BCBS/Medicaid  (approved 8 visits 8/20-9/16)    Authorization Time Period reauth submitted at 03/02/2023 visit    Authorization - Visit Number 5    Authorization - Number of Visits 8    PT Start Time 1100    PT Stop Time 1145    PT Time Calculation (min) 45 min    Equipment Utilized During Treatment Gait belt;Other (comment)   floatation devices as needed for safety   Activity Tolerance Patient tolerated treatment well    Behavior During Therapy WFL for tasks assessed/performed              Past Medical History:  Diagnosis Date   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HCC)    Chronic hepatitis B (HCC)    Fixed pupils    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HCC)    Hypertension    Intracranial injury of other and unspecified nature, without mention of open intracranial wound, unspecified state of consciousness    Intracranial shunt    Paralysis (HCC)    Spastic  hemiplegia affecting dominant side (HCC)    Stroke (HCC)    Traumatic brain injury (HCC)    Trouble swallowing    Visual disturbance    Weakness    Past Surgical History:  Procedure Laterality Date   cramiectomy     CRANIOPLASTY     REMOVAL OF GASTROSTOMY TUBE     VENTRICULOPERITONEAL SHUNT     Patient Active Problem List   Diagnosis Date Noted   SIRS due to infectious process with acute organ dysfunction (HCC) 03/14/2021   SIRS (systemic inflammatory response syndrome) (HCC) 03/12/2021   Syncope 03/12/2021   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) 03/12/2021   Hypokalemia 03/12/2021   Lactic acidosis 03/12/2021   Mixed hyperlipidemia 04/25/2018   Seizure disorder (HCC) 04/11/2018   Mechanical low back pain 05/12/2017   Dysarthria 12/20/2015   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/20/2012   Deafness, sensorineural 04/20/2012   Buzzing in ear 04/20/2012   Leaking percutaneous endoscopic gastrostomy (PEG) tube (HCC) 02/04/2012   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 pre-existing conscious levels, sequela (HCC) 10/12/2011   Cerebral infarct (HCC) 10/12/2011   Spastic hemiplegia of right dominant side due  to noncerebrovascular etiology (HCC) 10/12/2011   Dysphagia 10/12/2011   Functioning G-tube 03/24/2011   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   Hemiplegia of dominant side (HCC) 07/28/2010   CEREBRAL HEMORRHAGE 07/28/2010   PERSONAL HISTORY OF TRAUMATIC BRAIN INJURY 07/02/2010   ALLERGIC RHINITIS CAUSE UNSPECIFIED 10/25/2008   GASTROENTERITIS 08/09/2008   CONTACT DERMATITIS&OTHER ECZEMA DUE DETERGENTS 08/18/2007   HEPATITIS B, CHRONIC 06/28/2007   Chronic viral hepatitis B without delta-agent (HCC) 06/28/2007    ONSET DATE: 2012  REFERRING DIAG: G81.11 (ICD-10-CM) - Spastic hemiplegia of right dominant side due to noncerebrovascular etiology (HCC)  THERAPY DIAG:  Muscle weakness (generalized)  Spastic  hemiplegia affecting right dominant side, unspecified etiology (HCC)  Other symptoms and signs involving the nervous system  Unsteadiness on feet  Other abnormalities of gait and mobility  Rationale for Evaluation and Treatment: Rehabilitation  SUBJECTIVE:                                                                                                                                                                                             SUBJECTIVE STATEMENT: Everything is going good, last pool session coming up next week  Pt accompanied by: family member (father)  PERTINENT HISTORY: Fixed pupils, cerebral thombus with infarction, brain stem herniation, HTN,TBI s/p cranioplasty, VP shunt  PAIN:  Are you having pain? No  PRECAUTIONS: Fall and Other: VP shunt  RED FLAGS: None   WEIGHT BEARING RESTRICTIONS: No   FALLS: Has patient fallen in last 6 months? No  LIVING ENVIRONMENT: Lives with: lives with their family Lives in: House/apartment Stairs:  3 steps to enter; 2nd floor with B handrails Has following equipment at home: Wheelchair (manual) and Tour manager; also has [name]  PLOF: Independent with basic ADLs  PATIENT GOALS: improve R ankle mobility and balance   OBJECTIVE:   TODAY'S TREATMENT: 03/18/23 Activity Comments  Standing and turning retrieve bean bags throwing to target   LLE on step throwing bean bags to target Reaching all planes to improve RLE stance and rotation stability in closed chain  Kicking activity 2x 2 min   LE passive/static stretching Emphasis on right quad/rectus femoris to reduce tone/spasticity for improved knee flexion           TODAY'S TREATMENT: 03/11/2023 Aquatic therapy at [facility] - pool temperature 92 degrees   Patient seen for aquatic therapy today.  Treatment took place in water 3.6-4.0 feet deep depending upon activity.  Patient entered and exited the pool via stairs using step-to pattern at [facility] level.    Warm up:  With LUE HHA from PT.  Forwards walking x approx  18' x 4 laps, backwards walking x 4 laps, side stepping x 4 laps with cues for posture and slow controlled movements.    Side stepping with squats x 2 laps with B HHA from PT with cues for slower motion and ensuring he had stepping prior to doing squat.      NMR:  Sit<>stand with feet on small step, sitting on pool bench.  PT maintaining position of R foot/LE during transitions along with facilitating improved R lateral weight shift x 20 reps with cues for slow movement, esp with descent.  Squats (trying to touch buttocks to bench then return to stand) x 20 reps again with cues for slow controlled movement.   Forward lunges with HHA on pts LUE x 4 laps.  Only min guiding cues on technique but pt demonstrating much improved technique after 2 laps.  Forward step ups with LUE support on wall x 20 reps, randomly switching feet with PT providing light facilitation for increased forward weight shift.  Forward marching x 2 laps, with cues and light facilitation for improved forward weight shift over RLE during stance.    Pt willing to attempt tall kneel and half kneel exercises today.  PT placed blue ankle weight under knees which allowed him to tolerate better.  Some shin pain after half kneel but overall did very well with all exercises with min to mod A (mostly for stabilizing RLE):  L hip ext x 10 reps, L hip abd x 10 reps.  RLE does extend out from under him a couple times but able to get back into position with assist.  In L half kneel (R knee on step), had him perform LUE flex/ext x 10 reps.  PT assisted with placing R hand around yellow barbell (he is able to hold L barbell), pushing barbells away from body and back (chest press) and then holding in front of him and rotating trunk laterally x 20 reps.     Patient requires buoyancy of the water for support for reduced fall risk with gait training and balance exercises with CGA-minA support.  Exercises able to be performed safely in water without the risk of fall compared to those same exercises performed on land; viscosity of water needed for resistance for strengthening. Current of water provides perturbations for challenging static and dynamic balance.     HOME EXERCISE PROGRAM Last updated: 02/25/23 Access Code: [email or web address] URL: [email or web address] Date: 02/25/2023 Prepared by: [facility] - Outpatient  Rehab - [facility]  Exercises - Seated Gastroc Stretch with Strap  - 1 x daily - 5 x weekly - 2 sets - 30  hold - Seated Hamstring Stretch  - 1 x daily - 5 x weekly - 2 sets - 30 sec hold - Supine Figure 4 Piriformis Stretch  - 1 x daily - 5 x weekly - 2 sets - 30 sec hold   PATIENT EDUCATION: Education details: Frequent redirection to task. Person educated: Patient and Parent Education method: Explanation, Demonstration, Tactile cues, Verbal cues, and Handouts Education comprehension: verbalized understanding and returned demonstration    Below measures were taken at time of initial evaluation unless otherwise specified:   DIAGNOSTIC FINDINGS: none recent   COGNITION: Overall cognitive status: History of cognitive impairments - at baseline   SENSATION: WFL; reports reduced sensation in R UE  COORDINATION: Alternating pronation/supination: unable on R UE d/t stiffness Alternating toe tap: unable to coordinate alt motions     EDEMA:  none  MUSCLE TONE: RLE: mild in  L ankle, severe in R ankle   POSTURE:  rounded, R elbow flexed   LOWER EXTREMITY ROM:     Active  Right Eval Left Eval  Hip flexion    Hip extension    Hip abduction    Hip adduction    Hip internal rotation    Hip external rotation    Knee flexion    Knee extension    Ankle dorsiflexion 5 14  Ankle plantarflexion    Ankle inversion    Ankle eversion     (Blank rows = not tested)  LOWER EXTREMITY MMT:    MMT Right Eval Left Eval  Hip flexion 4 4+  Hip  extension    Hip abduction 4 4+  Hip adduction 4 4+  Hip internal rotation    Hip external rotation    Knee flexion 4 5  Knee extension 4 5  Ankle dorsiflexion 3+ 4+  Ankle plantarflexion 4 4+  Ankle inversion    Ankle eversion    (Blank rows = not tested)   GAIT: Gait pattern: limited R knee flexion during swing, short step length and foot clearance, visible R ankle inversion tone during swing  Assistive device utilized: None Level of assistance:  HHA from mother   FUNCTIONAL TESTS:  Berg: 41/56    HOME EXERCISE PROGRAM: Not yet initiated   GOALS: Goals reviewed with patient? Yes  SHORT TERM GOALS: Target date: 03/10/2023  Patient to be independent with initial HEP. Baseline: HEP initiated Goal status: MET    LONG TERM GOALS: Target date: 03/31/2023  Patient to be independent with advanced HEP. Baseline: Initial stretching HEP initiated; will need progression of HEP with additional visits 03/02/2023 Goal status: IN PROGRESS  Patient to demonstrate at least 8 degrees R ankle dorsiflexion AROM to improve safety with ambulation. Baseline: 5 deg; 8-10 degrees P/ROM; 2 degrees A/AROM 03/02/2023 Goal status: IN PROGRESS  Patient/mother to report 50% improvement in wear time/frequency of R AFO for max safety. Baseline:wears R ankle brace for outdoors, not in home, per mom report 03/02/2023 Goal status: IN PROGRESS  Patient to demonstrate safe curb navigation with supervision/CGA only.  Baseline:  (stair negotiation with step to pattern; have not attempted curb) 03/02/2023 Goal status: IN PROGRESS  Patient to ambulate 200 ft safely with supervision.   Baseline: HHA with mother at eval; gait 85 ft x 2 with min guard 03/02/2023 Goal status: IN PROGRESS  Patient to score at least 45/56 on Berg in order to decrease risk of falls.  Baseline: 41 at eval Goal status: IN PROGRESS  ASSESSMENT:  CLINICAL IMPRESSION: Activities to improve safety with standing, turning, and  reaching outside BOS all planes.  Techniuqes to facilitate RLE single leg stability with closed chain activities to promote right hip rotation.  Kicking activity to enhance single limb stability and facilitate righting reactions and quick change of direction in order to reduce risk for falls. Static, passive stretching to RLE to reduce tone and improve knee flexion tolerating prone quad stretch to approx 95-100 degrees. Same for supine rectus femoris stretch. Continued sessions to progess POC details  OBJECTIVE IMPAIRMENTS: Abnormal gait, decreased activity tolerance, decreased balance, decreased coordination, difficulty walking, decreased ROM, decreased strength, increased muscle spasms, impaired flexibility, impaired tone, and postural dysfunction.   ACTIVITY LIMITATIONS: carrying, lifting, bending, standing, squatting, sleeping, stairs, transfers, bed mobility, bathing, toileting, dressing, reach over head, hygiene/grooming, and locomotion level  PARTICIPATION LIMITATIONS: meal prep, cleaning, laundry, shopping, community activity, and church  PERSONAL FACTORS:  Age, Past/current experiences, Time since onset of injury/illness/exacerbation, and 3+ comorbidities: Fixed pupils, cerebral thombus with infarction, brain stem herniation, HTN,TBI s/p cranioplasty 2011, VP shunt  are also affecting patient's functional outcome.   REHAB POTENTIAL: Good  CLINICAL DECISION MAKING: Evolving/moderate complexity  EVALUATION COMPLEXITY: Moderate  PLAN:  PT FREQUENCY: 2x/week  PT DURATION: 6 weeks  PLANNED INTERVENTIONS: Therapeutic exercises, Therapeutic activity, Neuromuscular re-education, Balance training, Gait training, Patient/Family education, Self Care, Joint mobilization, Stair training, Vestibular training, Canalith repositioning, DME instructions, Aquatic Therapy, Dry Needling, Electrical stimulation, Cryotherapy, Moist heat, Taping, Manual therapy, and Re-evaluation  PLAN FOR NEXT SESSION:   medicaid auth *completed* on 03/02/23; Work again on knee stability, check about lower/softer wedge that pt is wearing in R shoe; R LE spasticity management, aquatic therapy;progress HEP for LE stretching and balance   Per aquatic PT: From Irving Burton:: I have been told by Tomma Lightning that his dad takes him to the pool regularly and they have gone over exercises to do in the pool.  I have not met the dad yet, but perhaps you all can confirm this and I would say he could wrap up next week from an aquatic standpoint.   AQUATICS: Frequency: 1x Duration: 6 wks Special Instruction: gentle mobility and stretching of the R UE/LE   12:22 PM, 03/18/23 M. Shary Decamp, PT, DPT Physical Therapist- [place] Office Number: [phone]

## 2023-03-19 NOTE — Therapy (Signed)
OUTPATIENT PHYSICAL THERAPY NEURO TREATMENT   Patient Name: Paul Kane MRN: [id number] DOB:Dec 16, 1985, 37 y.o., male Today's Date: 03/19/2023   PCP: Bailey Mech, PA-C  REFERRING PROVIDER: Ranelle Oyster, MD  END OF SESSION:       Past Medical History:  Diagnosis Date   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HCC)    Chronic hepatitis B (HCC)    Fixed pupils    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HCC)    Hypertension    Intracranial injury of other and unspecified nature, without mention of open intracranial wound, unspecified state of consciousness    Intracranial shunt    Paralysis (HCC)    Spastic hemiplegia affecting dominant side (HCC)    Stroke (HCC)    Traumatic brain injury (HCC)    Trouble swallowing    Visual disturbance    Weakness    Past Surgical History:  Procedure Laterality Date   cramiectomy     CRANIOPLASTY     REMOVAL OF GASTROSTOMY TUBE     VENTRICULOPERITONEAL SHUNT     Patient Active Problem List   Diagnosis Date Noted   SIRS due to infectious process with acute organ dysfunction (HCC) 03/14/2021   SIRS (systemic inflammatory response syndrome) (HCC) 03/12/2021   Syncope 03/12/2021   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) 03/12/2021   Hypokalemia 03/12/2021   Lactic acidosis 03/12/2021   Mixed hyperlipidemia 04/25/2018   Seizure disorder (HCC) 04/11/2018   Mechanical low back pain 05/12/2017   Dysarthria 12/20/2015   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/20/2012   Deafness, sensorineural 04/20/2012   Buzzing in ear 04/20/2012   Leaking percutaneous endoscopic gastrostomy (PEG) tube (HCC) 02/04/2012   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 pre-existing conscious levels, sequela (HCC) 10/12/2011   Cerebral infarct (HCC) 10/12/2011   Spastic hemiplegia of right dominant side due to noncerebrovascular etiology (HCC) 10/12/2011   Dysphagia 10/12/2011   Functioning  G-tube 03/24/2011   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  Rationale for Evaluation and Treatment: Rehabilitation  SUBJECTIVE:  SUBJECTIVE STATEMENT: Everything is going good, last pool session coming up next week  Pt accompanied by: family member (father)  PERTINENT HISTORY: Fixed pupils, cerebral thombus with infarction, brain stem herniation, HTN,TBI s/p cranioplasty, VP shunt  PAIN:  Are you having pain? No  PRECAUTIONS: Fall and Other: VP shunt  RED FLAGS: None   WEIGHT BEARING RESTRICTIONS: No   FALLS: Has patient fallen in last 6 months? No  LIVING ENVIRONMENT: Lives with: lives with their family Lives in: House/apartment Stairs:  3 steps to enter; 2nd floor with B handrails Has following equipment at home: Wheelchair (manual) and Tour manager; also has [name]  PLOF: Independent with basic ADLs  PATIENT GOALS: improve R ankle mobility and balance   OBJECTIVE:     TODAY'S TREATMENT: 03/23/23 Activity Comments                        HOME EXERCISE PROGRAM Last updated: 02/25/23 Access Code: [email or web address] URL: [email or web address] Date: 02/25/2023 Prepared by: [facility] - Outpatient  Rehab - [facility]  Exercises - Seated Gastroc Stretch with Strap  - 1 x daily - 5 x weekly - 2 sets - 30  hold - Seated Hamstring Stretch  - 1 x daily - 5 x weekly - 2 sets - 30 sec hold - Supine Figure 4 Piriformis Stretch  - 1 x daily - 5 x weekly - 2 sets - 30 sec hold   Below measures were taken at time of initial evaluation unless otherwise specified:   DIAGNOSTIC FINDINGS: none recent   COGNITION: Overall cognitive status: History of cognitive impairments - at baseline   SENSATION: WFL; reports reduced sensation in R UE  COORDINATION: Alternating pronation/supination: unable on R UE d/t stiffness Alternating toe tap: unable to coordinate alt motions     EDEMA:  none  MUSCLE TONE: RLE: mild in L ankle, severe in R ankle   POSTURE:  rounded, R elbow flexed   LOWER EXTREMITY ROM:     Active  Right Eval Left Eval  Hip flexion    Hip extension    Hip abduction    Hip adduction    Hip internal rotation    Hip external rotation    Knee flexion    Knee extension    Ankle dorsiflexion 5 14  Ankle plantarflexion    Ankle inversion    Ankle eversion     (Blank rows = not tested)  LOWER EXTREMITY MMT:    MMT Right Eval Left Eval  Hip flexion 4 4+  Hip extension    Hip abduction 4 4+  Hip adduction 4 4+  Hip internal rotation    Hip external rotation    Knee flexion 4 5  Knee extension 4 5  Ankle dorsiflexion 3+ 4+  Ankle plantarflexion 4 4+  Ankle inversion    Ankle eversion    (Blank rows = not tested)   GAIT: Gait pattern: limited R knee flexion during swing, short step length and foot clearance, visible R ankle inversion tone during swing  Assistive device utilized: None Level of assistance:  HHA from mother   FUNCTIONAL TESTS:  Berg: 41/56    HOME EXERCISE PROGRAM: Not yet initiated   GOALS: Goals reviewed with patient? Yes  SHORT TERM GOALS: Target date: 03/10/2023  Patient to be independent with initial HEP. Baseline: HEP  initiated Goal status: MET    LONG TERM GOALS: Target date: 03/31/2023  Patient to be  independent with advanced HEP. Baseline: Initial stretching HEP initiated; will need progression of HEP with additional visits 03/02/2023 Goal status: IN PROGRESS  Patient to demonstrate at least 8 degrees R ankle dorsiflexion AROM to improve safety with ambulation. Baseline: 5 deg; 8-10 degrees P/ROM; 2 degrees A/AROM 03/02/2023 Goal status: IN PROGRESS  Patient/mother to report 50% improvement in wear time/frequency of R AFO for max safety. Baseline:wears R ankle brace for outdoors, not in home, per mom report 03/02/2023 Goal status: IN PROGRESS  Patient to demonstrate safe curb navigation with supervision/CGA only.  Baseline:  (stair negotiation with step to pattern; have not attempted curb) 03/02/2023 Goal status: IN PROGRESS  Patient to ambulate 200 ft safely with supervision.   Baseline: HHA with mother at eval; gait 85 ft x 2 with min guard 03/02/2023 Goal status: IN PROGRESS  Patient to score at least 45/56 on Berg in order to decrease risk of falls.  Baseline: 41 at eval Goal status: IN PROGRESS  ASSESSMENT:  CLINICAL IMPRESSION: Activities to improve safety with standing, turning, and reaching outside BOS all planes.  Techniuqes to facilitate RLE single leg stability with closed chain activities to promote right hip rotation.  Kicking activity to enhance single limb stability and facilitate righting reactions and quick change of direction in order to reduce risk for falls. Static, passive stretching to RLE to reduce tone and improve knee flexion tolerating prone quad stretch to approx 95-100 degrees. Same for supine rectus femoris stretch. Continued sessions to progess POC details  OBJECTIVE IMPAIRMENTS: Abnormal gait, decreased activity tolerance, decreased balance, decreased coordination, difficulty walking, decreased ROM, decreased strength, increased muscle spasms, impaired flexibility,  impaired tone, and postural dysfunction.   ACTIVITY LIMITATIONS: carrying, lifting, bending, standing, squatting, sleeping, stairs, transfers, bed mobility, bathing, toileting, dressing, reach over head, hygiene/grooming, and locomotion level  PARTICIPATION LIMITATIONS: meal prep, cleaning, laundry, shopping, community activity, and church  PERSONAL FACTORS: Age, Past/current experiences, Time since onset of injury/illness/exacerbation, and 3+ comorbidities: Fixed pupils, cerebral thombus with infarction, brain stem herniation, HTN,TBI s/p cranioplasty 2011, VP shunt  are also affecting patient's functional outcome.   REHAB POTENTIAL: Good  CLINICAL DECISION MAKING: Evolving/moderate complexity  EVALUATION COMPLEXITY: Moderate  PLAN:  PT FREQUENCY: 2x/week  PT DURATION: 6 weeks  PLANNED INTERVENTIONS: Therapeutic exercises, Therapeutic activity, Neuromuscular re-education, Balance training, Gait training, Patient/Family education, Self Care, Joint mobilization, Stair training, Vestibular training, Canalith repositioning, DME instructions, Aquatic Therapy, Dry Needling, Electrical stimulation, Cryotherapy, Moist heat, Taping, Manual therapy, and Re-evaluation  PLAN FOR NEXT SESSION:  medicaid auth *completed* on 03/02/23; Work again on knee stability, check about lower/softer wedge that pt is wearing in R shoe; R LE spasticity management, aquatic therapy;progress HEP for LE stretching and balance   Per aquatic PT: From Irving Burton:: I have been told by Tomma Lightning that his dad takes him to the pool regularly and they have gone over exercises to do in the pool.  I have not met the dad yet, but perhaps you all can confirm this and I would say he could wrap up next week from an aquatic standpoint.   AQUATICS: Frequency: 1x Duration: 6 wks Special Instruction: gentle mobility and stretching of the R UE/LE

## 2023-03-23 ENCOUNTER — Ambulatory Visit: Payer: BC Managed Care – PPO | Attending: Physical Medicine & Rehabilitation | Admitting: Physical Therapy

## 2023-03-23 ENCOUNTER — Encounter: Payer: Self-pay | Admitting: Physical Therapy

## 2023-03-23 ENCOUNTER — Ambulatory Visit: Payer: BC Managed Care – PPO | Admitting: Rehabilitation

## 2023-03-23 ENCOUNTER — Ambulatory Visit: Payer: BC Managed Care – PPO | Admitting: Occupational Therapy

## 2023-03-23 DIAGNOSIS — R2681 Unsteadiness on feet: Secondary | ICD-10-CM | POA: Diagnosis present

## 2023-03-23 DIAGNOSIS — M25611 Stiffness of right shoulder, not elsewhere classified: Secondary | ICD-10-CM | POA: Diagnosis present

## 2023-03-23 DIAGNOSIS — M25621 Stiffness of right elbow, not elsewhere classified: Secondary | ICD-10-CM | POA: Diagnosis present

## 2023-03-23 DIAGNOSIS — M6281 Muscle weakness (generalized): Secondary | ICD-10-CM | POA: Insufficient documentation

## 2023-03-23 DIAGNOSIS — M25641 Stiffness of right hand, not elsewhere classified: Secondary | ICD-10-CM | POA: Diagnosis present

## 2023-03-23 DIAGNOSIS — G8111 Spastic hemiplegia affecting right dominant side: Secondary | ICD-10-CM | POA: Diagnosis present

## 2023-03-23 DIAGNOSIS — R2689 Other abnormalities of gait and mobility: Secondary | ICD-10-CM

## 2023-03-23 DIAGNOSIS — R29818 Other symptoms and signs involving the nervous system: Secondary | ICD-10-CM | POA: Diagnosis present

## 2023-03-23 NOTE — Therapy (Signed)
OUTPATIENT OCCUPATIONAL THERAPY NEURO TREATMENT  Patient Name: Paul Kane MRN: [id number] DOB:May 30, 1986, 37 y.o., male Today's Date: 03/23/2023  PCP: Bailey Mech, PA-C  REFERRING PROVIDER: Ranelle Oyster, MD  END OF SESSION:  OT End of Session - 03/23/23 1413     Visit Number 4    Number of Visits 9    Date for OT Re-Evaluation 04/30/23    Authorization Type BCBS / Medicaid-[place] Access 2024    OT Start Time 1234    OT Stop Time 1315    OT Time Calculation (min) 41 min    Activity Tolerance Patient tolerated treatment well    Behavior During Therapy WFL for tasks assessed/performed               Past Medical History:  Diagnosis Date   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HCC)    Chronic hepatitis B (HCC)    Fixed pupils    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HCC)    Hypertension    Intracranial injury of other and unspecified nature, without mention of open intracranial wound, unspecified state of consciousness    Intracranial shunt    Paralysis (HCC)    Spastic hemiplegia affecting dominant side (HCC)    Stroke (HCC)    Traumatic brain injury (HCC)    Trouble swallowing    Visual disturbance    Weakness    Past Surgical History:  Procedure Laterality Date   cramiectomy     CRANIOPLASTY     REMOVAL OF GASTROSTOMY TUBE     VENTRICULOPERITONEAL SHUNT     Patient Active Problem List   Diagnosis Date Noted   SIRS due to infectious process with acute organ dysfunction (HCC) 03/14/2021   SIRS (systemic inflammatory response syndrome) (HCC) 03/12/2021   Syncope 03/12/2021   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) 03/12/2021   Hypokalemia 03/12/2021   Lactic acidosis 03/12/2021   Mixed hyperlipidemia 04/25/2018   Seizure disorder (HCC) 04/11/2018   Mechanical low back pain 05/12/2017   Dysarthria 12/20/2015   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/20/2012   Deafness, sensorineural 04/20/2012   Buzzing in ear 04/20/2012   Leaking  percutaneous endoscopic gastrostomy (PEG) tube (HCC) 02/04/2012   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 pre-existing conscious levels, sequela (HCC) 10/12/2011   Cerebral infarct (HCC) 10/12/2011   Spastic hemiplegia of right dominant side due to noncerebrovascular etiology (HCC) 10/12/2011   Dysphagia 10/12/2011   Functioning G-tube 03/24/2011   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   Hemiplegia of dominant side (HCC) 07/28/2010   CEREBRAL HEMORRHAGE 07/28/2010   PERSONAL HISTORY OF TRAUMATIC BRAIN INJURY 07/02/2010   ALLERGIC RHINITIS CAUSE UNSPECIFIED 10/25/2008   GASTROENTERITIS 08/09/2008   CONTACT DERMATITIS&OTHER ECZEMA DUE DETERGENTS 08/18/2007   HEPATITIS B, CHRONIC 06/28/2007   Chronic viral hepatitis B without delta-agent (HCC) 06/28/2007    ONSET DATE: referral date 02/17/23  REFERRING DIAG: G81.11 (ICD-10-CM) - Spastic hemiplegia affecting right dominant side, unspecified etiology  THERAPY DIAG:  Spastic hemiplegia affecting right dominant side, unspecified etiology (HCC)  Unsteadiness on feet  Other abnormalities of gait and mobility  Muscle weakness (generalized)  Rationale for Evaluation and Treatment: Rehabilitation  SUBJECTIVE:   SUBJECTIVE STATEMENT: Pt reports "hurting" and pointing to R elbow.  Father reports that he fell about 2 weeks ago. Pt accompanied by: self and family member (father)  PERTINENT HISTORY:  1. History of traumatic brain injury with hydrocephalus, meningitis,   and right pontine stroke (2011) 2.  Spastic tetraplegia, dominant side more affected  PRECAUTIONS: Fall and Other: h/o seizures  WEIGHT BEARING RESTRICTIONS: No  PAIN:  Are you having pain? No  FALLS: Has patient fallen in last 6 months? Yes. Number of falls mother reports that "he falls a lot, but not lately"  LIVING ENVIRONMENT: Lives with: lives with their family Lives in: House/apartment Stairs: Yes:  Internal: full flight to 2nd floor, but bedroom and bathroom on main floor and does not need to go up steps; can reach both and External: 3 steps; can reach both Has following equipment at home: shower chair and Grab bars  PLOF: Needs assistance with ADLs  PATIENT GOALS: increased use of R arm  OBJECTIVE:   HAND DOMINANCE: Right  ADLs: Mobility: Mod I around the house, hand held assist when in the community Grooming: Mod I  UB Dressing: Mod I LB Dressing: family assists with donning AFO and tying shoes, will assist with setup for clothing on RLE and then pt able to pull up Toileting: Mod I - utilizes bidet for hygiene Bathing: family assists with bathing, he completes ~90% Tub Shower transfers: Supervision/assist with shower transfers Equipment: Shower seat with back, Grab bars, and Walk in shower  IADLs: assists with housekeeping tasks with emptying dishwasher and folding clothes  MOBILITY STATUS: Hx of falls  UPPER EXTREMITY ROM:  noted R scapular winging and decreased scapular elevation  Active ROM Right eval Right PROM  Shoulder flexion 84   Shoulder abduction    Shoulder adduction    Shoulder extension    Shoulder internal rotation    Shoulder external rotation    Elbow flexion 140   Elbow extension -42   Wrist flexion unable 48  Wrist extension 22 35  Wrist ulnar deviation    Wrist radial deviation    Wrist pronation    Wrist supination    (Blank rows = not tested)   HAND FUNCTION: Pt is able to manually open hand with great effort, most active movement is in index finger  COORDINATION: Unable to assess due to decreased grasp/release  MUSCLE TONE: RUE: RUE: Moderate, Hypertonic, and Modifed Ashworth Scale 3 = Considerable increase in muscle tone, passive movement difficult  COGNITION: Overall cognitive status: Impaired and History of cognitive impairments - at baseline, mother present providing majority of information    TODAY'S TREATMENT:                                                                           DATE:  03/23/23 UE ROM: engaged in elbow extension and shoulder flexion pushing large therapy ball for increased shoulder flexion.  OT providing visual cue to facilitate increased ROM to target and intermittent support to sustain stretch for 3-5 seconds.  Engaged in elbow extension and shoulder flexion in supine with focus on L hand placement to further facilitate increased stretch and cues for sustained stretch 5-10 seconds.   PROM: engaged in supine shoulder abduction and internal/external rotation.   OT providing verbal and tactile cues for sustained abduction during internal/external rotation.   WB through RUE: engaged in WB through R elbow with focus on maintaining R arm stable at ~45* abduction to use body on arm to increase stretch and gentle  self-ROM of elbow with flexion/extension during reaching task.   03/15/23 Elbow ROM: Engaged in reaching towards floor with sustained stretch and extension, utilizing thigh for bracing and L hand at wrist to further facilitate stretch.  OT providing encouraging to hold position 15-30 seconds for prolonged stretch. UE ROM: engaged in WB through LUE over half ball to facilitate finger extension, wrist extension, and elbow flexion/extension while reaching across midline with LUE to further facilitate WB.  OT providing support at R hand and elbow to facilitate extension when reaching outside body to place items on L.  OT providing tactile cues and therapeutic touch to relax thumb IP hyperextension.  Progressed to elbow flexion/extension and shoulder flexion with use of cane.  OT providing facilitation at elbow and hand to maintain grasp and full ROM as tolerated. Wrist flexion/extension: engaged in PROM with therapist providing stretch through wrist flexion and extension.  Pt demonstrating increased tightness with wrist flexion, compared to extension.  OT also facilitating supination stretch, however  extremely tight with little ROM.    03/09/23: Patient walked back from waiting room with min assist - dad holding onto patient.  Patient bright and pleasant throughout session.  Patient with increased tension in right elbow/ shoulder after walking into clinic.  Patient unable to release tension initially in elbow - ~ 90* elbow flexion/extension.   Reviewed HEP scapular motions.    Patient positioned in R sidelying.  Immediately displayed increase elbow extension. Taught patient gentle self range of elbow in sidelying.  Patient achieved nearly full elbow extension (-15 degrees)   Worked sidelying toward supine with right arm stable in ~ 45* abduction to use body on arm to increase stretch to anterior chest/ shoulder girdle.    Returned to sitting and worked on reaching with gravity downward toward AFO.  Patient able to touch top strap of brace.  Reviewed three exercises and patient able to repeat.  Father present entire session.     PATIENT EDUCATION: Education details: ongoing condition specific education Person educated: Patient and Parent Education method: Explanation, Demonstration, Actor cues, and Handouts Education comprehension: needs further education  HOME EXERCISE PROGRAM: SRA lab Self-ROM shoulder and scapula exercises  Access Code: [email or web address] URL: [email or web address] Date: 03/15/2023 Prepared by: [facility] Out[facility]  Rehab - [facility]    GOALS: Goals reviewed with patient? Yes  SHORT TERM GOALS: Target date: 04/02/23  Pt and caregiver will be independent with HEP for shoulder and elbow ROM. Baseline: Pt not currently engaging in any ROM/stretching program Goal status: ONGOING   Pt will demonstrate improved active elbow extension to -35* to increase functional use Baseline: -42* Goal status: ONGOING   Pt will demonstrate improved shoulder flexion to 90* to increase functional use Baseline: 84* Goal status: ONGOING   LONG TERM GOALS:  Target date: 04/30/23  Pt will tolerate advanced HEP/stretching program for RUE. Baseline: No HEP at this time Goal status: ONGOING   Pt will demonstrate improved active elbow extension to -25* to increase functional use Baseline: -42* Goal status: ONGOING  Pt will demonstrate improved shoulder flexion to 110* to allow for improved ease with ADLs. Baseline: 84* Goal status: ONGOING   4.   Pt will be independent with splint wear and care PRN Baseline: splinting needs TBD Goal status: ONGOING   ASSESSMENT:  CLINICAL IMPRESSION: Pt demonstrating good tolerance to shoulder abduction and internal/external rotation.  Good use supine position to decrease compensatory trunk movements during PROM.  PERFORMANCE DEFICITS: in functional skills including ADLs, IADLs,  coordination, dexterity, tone, ROM, strength, pain, flexibility, FMC, GMC, decreased knowledge of precautions, and UE functional use, cognitive skills including learn, memory, and safety awareness.    PLAN:  OT FREQUENCY: 1x/week  OT DURATION: 8 weeks  PLANNED INTERVENTIONS: self care/ADL training, therapeutic exercise, therapeutic activity, neuromuscular re-education, manual therapy, passive range of motion, aquatic therapy, splinting, moist heat, cryotherapy, and patient/family education   RECOMMENDED OTHER SERVICES: may benefit from aquatic therapy OT  CONSULTED AND AGREED WITH PLAN OF CARE: Patient and family member/caregiver  PLAN FOR NEXT SESSION: Review HEP and begin PROM and self-ROM for shoulder and elbow ROM.  Assess previous splint and plan to fabricate in future session.    Rosalio Loud, OTR/L 03/23/2023, 2:14 PM

## 2023-03-25 ENCOUNTER — Ambulatory Visit: Payer: BC Managed Care – PPO | Admitting: Physical Therapy

## 2023-03-25 ENCOUNTER — Encounter: Payer: Self-pay | Admitting: Physical Therapy

## 2023-03-25 DIAGNOSIS — G8111 Spastic hemiplegia affecting right dominant side: Secondary | ICD-10-CM | POA: Diagnosis not present

## 2023-03-25 DIAGNOSIS — R2681 Unsteadiness on feet: Secondary | ICD-10-CM

## 2023-03-25 DIAGNOSIS — R29818 Other symptoms and signs involving the nervous system: Secondary | ICD-10-CM

## 2023-03-25 DIAGNOSIS — M6281 Muscle weakness (generalized): Secondary | ICD-10-CM

## 2023-03-25 DIAGNOSIS — R2689 Other abnormalities of gait and mobility: Secondary | ICD-10-CM

## 2023-03-25 NOTE — Therapy (Signed)
OUTPATIENT PHYSICAL THERAPY NEURO   Patient Name: Paul Kane MRN: [id number] DOB:10-Jul-1986, 37 y.o., male Today's Date: 03/25/2023   PCP: Bailey Mech, PA-C  REFERRING PROVIDER: Ranelle Oyster, MD  END OF SESSION:  PT End of Session - 03/25/23 1900     Visit Number 11    Number of Visits 12    Date for PT Re-Evaluation 04/16/23    Authorization Type BCBS/Medicaid  (approved 8 visits 8/20-9/16)    Authorization Time Period reauth submitted at 03/02/2023 visit    Authorization - Visit Number 7    Authorization - Number of Visits 8    PT Start Time 1107   patient arrived late needing to change   PT Stop Time 1148    PT Time Calculation (min) 41 min    Activity Tolerance Patient tolerated treatment well    Behavior During Therapy WFL for tasks assessed/performed              Past Medical History:  Diagnosis Date   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HCC)    Chronic hepatitis B (HCC)    Fixed pupils    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HCC)    Hypertension    Intracranial injury of other and unspecified nature, without mention of open intracranial wound, unspecified state of consciousness    Intracranial shunt    Paralysis (HCC)    Spastic hemiplegia affecting dominant side (HCC)    Stroke (HCC)    Traumatic brain injury (HCC)    Trouble swallowing    Visual disturbance    Weakness    Past Surgical History:  Procedure Laterality Date   cramiectomy     CRANIOPLASTY     REMOVAL OF GASTROSTOMY TUBE     VENTRICULOPERITONEAL SHUNT     Patient Active Problem List   Diagnosis Date Noted   SIRS due to infectious process with acute organ dysfunction (HCC) 03/14/2021   SIRS (systemic inflammatory response syndrome) (HCC) 03/12/2021   Syncope 03/12/2021   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) 03/12/2021   Hypokalemia 03/12/2021   Lactic acidosis 03/12/2021   Mixed hyperlipidemia 04/25/2018   Seizure disorder (HCC) 04/11/2018    Mechanical low back pain 05/12/2017   Dysarthria 12/20/2015   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/20/2012   Deafness, sensorineural 04/20/2012   Buzzing in ear 04/20/2012   Leaking percutaneous endoscopic gastrostomy (PEG) tube (HCC) 02/04/2012   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 pre-existing conscious levels, sequela (HCC) 10/12/2011   Cerebral infarct (HCC) 10/12/2011   Spastic hemiplegia of right dominant side due to noncerebrovascular etiology (HCC) 10/12/2011   Dysphagia 10/12/2011   Functioning G-tube 03/24/2011   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   Hemiplegia of dominant side (HCC) 07/28/2010   CEREBRAL HEMORRHAGE 07/28/2010   PERSONAL HISTORY OF TRAUMATIC BRAIN INJURY 07/02/2010   ALLERGIC RHINITIS CAUSE UNSPECIFIED 10/25/2008   GASTROENTERITIS 08/09/2008   CONTACT DERMATITIS&OTHER ECZEMA DUE DETERGENTS 08/18/2007   HEPATITIS B, CHRONIC 06/28/2007   Chronic viral hepatitis B without delta-agent (HCC) 06/28/2007    ONSET DATE: 2012  REFERRING DIAG: G81.11 (ICD-10-CM) - Spastic hemiplegia of right dominant side due to noncerebrovascular etiology (HCC)  THERAPY DIAG:  Spastic hemiplegia affecting right dominant side, unspecified etiology (HCC)  Unsteadiness on feet  Other abnormalities of gait and mobility  Muscle weakness (generalized)  Other symptoms and signs involving the nervous system  Rationale for Evaluation and Treatment: Rehabilitation  SUBJECTIVE:  SUBJECTIVE STATEMENT: Dad presents to session prepared to get in pool with patient for wrap-up aquatic visit.  They are members at [facility] and plan to continue using therapeutic pool upon discharge. Pt accompanied by: family member (father)  PERTINENT HISTORY: Fixed pupils,  cerebral thombus with infarction, brain stem herniation, HTN,TBI s/p cranioplasty, VP shunt  PAIN:  Are you having pain? No  PRECAUTIONS: Fall and Other: VP shunt  RED FLAGS: None   WEIGHT BEARING RESTRICTIONS: No   FALLS: Has patient fallen in last 6 months? No  LIVING ENVIRONMENT: Lives with: lives with their family Lives in: House/apartment Stairs:  3 steps to enter; 2nd floor with B handrails Has following equipment at home: Wheelchair (manual) and Tour manager; also has [name]  PLOF: Independent with basic ADLs  PATIENT GOALS: improve R ankle mobility and balance   OBJECTIVE:  TODAY'S TREATMENT: 03/25/23 Aquatic therapy at [facility] - pool temperature 92 degrees   Patient seen for aquatic therapy today.  Treatment took place in water 3.6-4.8 feet deep depending upon activity.  Patient entered and exited the pool via step-to pattern using unilateral rail at [facility] level.   Exercises: Warmup:  water walking forwards 4x18 ft > backwards 4x18 ft > laterally 4x18 ft  -Walking marches 4x18 ft HHA to SBA; encouraged dad to stay closer to pt in event of a severe stumble to prevent head going under -STS from bench 3x10 (third round with step under BLE per pt preference), SBA -Heel raises off step edge using unilateral rail support 2x15 -Seated hamstring stretch w/ light assistance provided to maintain knee extension x2 minutes -PT provided DF stretch in sitting x1 minute > dad demonstrates runners stretch and how he corrects pelvis for improved posture on hemiparetic side, encouraged to continue this stretch -Lateral step out and in 2x10 each side > attempted fluid hip abduction with some difficulty, but pt able to somewhat return demo over increased reps -Hip extension step back > kickbacks 2x10 each LE using wall support and return demo -Forward lunging at wall, needs smaller left step size to maintain balance x12 each LE > lateral lunge x12 each side, pt reports right  adductor stretch w/ left lunge  -Dad demonstrates floating (edu on benefits to spasticity management and need to keep head above water), use of 2 noodles to support arms, head and posterior trunk > had dad oscillate side-to-side for more movement/relaxation of muscles, pt demonstrates gentle kicks and bicycling, hip abduction and adduction > wall push-offs x2, did request dad stay close to patient when they do this to prevent head going under water (did not this visit due to support of noodles).  Patient requires buoyancy of the water for support for reduced fall risk with gait training and balance exercises with SBA-minA support. Exercises able to be performed safely in water without the risk of fall compared to those same exercises performed on land; viscosity of water needed for resistance for strengthening. Current of water provides perturbations for challenging static and dynamic balance.   PATIENT EDUCATION: Education details: Environmental health practitioner, will send new aquatic HEP for printing to next scheduled land therapist.  Discussed [facility] housekeeping regarding maintaining pt modesty in facility during changing.  Education regarding guarding patient by not pulling on hemiparetic arm especially with elevated fall risk when not wearing AFO in aquatic environment - pt catches toe on hemiparetic side x3 during entering and exiting pool area requiring minA to steady from therapist. Person educated: Patient and Parent Education  method: Explanation Education comprehension: verbalized understanding   HOME EXERCISE PROGRAM Last updated: 02/25/23 Access Code: [email or web address] URL: [email or web address] Date: 02/25/2023 Prepared by: [facility] - [facility]  Exercises - Seated Gastroc Stretch with Strap  - 1 x daily - 5 x weekly - 2 sets - 30  hold - Seated Hamstring Stretch  - 1 x daily - 5 x weekly - 2 sets - 30 sec hold - Supine Figure 4 Piriformis Stretch  - 1 x daily - 5 x  weekly - 2 sets - 30 sec hold  AQUATIC HEP: Continue hamstring and heel cord stretch as already independent with!  Continue floating for spasticity management (supporting head and upper body with 2 noodles and caregiver), can continue wall push-off so long as head does not go under water!  Can march in place or across pool.  Access Code: [email or web address] URL: [email or web address] Date: 03/25/2023 Prepared by: Camille Bal  Exercises - Heel Toe Raises at [facility]  - 1 x daily - 7 x weekly - 3 sets - 10 reps - Standing March at [facility]  - 1 x daily - 7 x weekly - 3 sets - 10 reps - Standing Hip Abduction Adduction at Pool Wall  - 1 x daily - 7 x weekly - 3 sets - 10 reps - Forward and Backward Stepping at [employer]  - 1 x daily - 7 x weekly - 3 sets - 10 reps - Lunge to Target at [employer]  - 1 x daily - 7 x weekly - 3 sets - 10 reps - Lateral Stepping at Pool Wall  - 1 x daily - 7 x weekly - 3 sets - 10 reps - Lateral Weight Shift with Step at Pool Wall  - 1 x daily - 7 x weekly - 3 sets - 10 reps  Below measures were taken at time of initial evaluation unless otherwise specified:   DIAGNOSTIC FINDINGS: none recent   COGNITION: Overall cognitive status: History of cognitive impairments - at baseline   SENSATION: WFL; reports reduced sensation in R UE  COORDINATION: Alternating pronation/supination: unable on R UE d/t stiffness Alternating toe tap: unable to coordinate alt motions     EDEMA:  none  MUSCLE TONE: RLE: mild in L ankle, severe in R ankle   POSTURE:  rounded, R elbow flexed   LOWER EXTREMITY ROM:     Active  Right Eval Left Eval  Hip flexion    Hip extension    Hip abduction    Hip adduction    Hip internal rotation    Hip external rotation    Knee flexion    Knee extension    Ankle dorsiflexion 5 14  Ankle plantarflexion    Ankle inversion    Ankle eversion     (Blank rows = not tested)  LOWER EXTREMITY MMT:    MMT Right Eval  Left Eval  Hip flexion 4 4+  Hip extension    Hip abduction 4 4+  Hip adduction 4 4+  Hip internal rotation    Hip external rotation    Knee flexion 4 5  Knee extension 4 5  Ankle dorsiflexion 3+ 4+  Ankle plantarflexion 4 4+  Ankle inversion    Ankle eversion    (Blank rows = not tested)   GAIT: Gait pattern: limited R knee flexion during swing, short step length and foot clearance, visible R ankle inversion tone during swing  Assistive device utilized: None  Level of assistance:  HHA from mother   FUNCTIONAL TESTS:  Berg: 41/56     HOME EXERCISE PROGRAM: Not yet initiated   GOALS: Goals reviewed with patient? Yes  SHORT TERM GOALS: Target date: 03/10/2023  Patient to be independent with initial HEP. Baseline: HEP initiated Goal status: MET    LONG TERM GOALS: Target date: 04/16/2023  Patient to be independent with advanced HEP. Baseline: Initial stretching HEP initiated; will need progression of HEP with additional visits 03/02/2023 Goal status: IN PROGRESS 03/23/23  Patient to demonstrate at least 8 degrees R ankle dorsiflexion AROM to improve safety with ambulation. Baseline: 5 deg; 8-10 degrees P/ROM; 2 degrees A/AROM 03/02/2023; 4 deg 03/23/23 Goal status: IN PROGRESS 03/23/23  Patient/mother to report 50% improvement in wear time/frequency of R AFO for max safety. Baseline:wears R ankle brace for outdoors, not in home, per mom report 03/02/2023; Father reports that the pt wears AFO only when out of the house 03/23/23 Goal status: IN PROGRESS 03/23/23  Patient to demonstrate safe curb navigation with supervision/CGA only.  Baseline:  (stair negotiation with step to pattern; have not attempted curb) 03/02/2023; provided min A for curbs 03/23/23 Goal status: IN PROGRESS 03/23/23  Patient to ambulate 200 ft safely with supervision.   Baseline: HHA with mother at eval; gait 85 ft x 2 with min guard 03/02/2023; 14ft outside with close CGA Goal status: IN PROGRESS  03/23/23  Patient to score at least 45/56 on Berg in order to decrease risk of falls.  Baseline: 41 at eval; 41 03/23/23 Goal status: IN PROGRESS 03/23/23  ASSESSMENT:  CLINICAL IMPRESSION: Patient arrives late this visit.  Dad enters pool with patient for demonstration of ongoing aquatic routine as they are members of this facility.  Some education and caregiver training provided this session regarding safety in pool area and in pool to prevent head entering water.  Patient and dad are able to continue aquatic HEP without need for further skilled intervention at this time as pt has reached maximum potential in this setting.  Will discontinue aquatic therapy at this time and have land therapist print aquatic HEP at next visit. OBJECTIVE IMPAIRMENTS: Abnormal gait, decreased activity tolerance, decreased balance, decreased coordination, difficulty walking, decreased ROM, decreased strength, increased muscle spasms, impaired flexibility, impaired tone, and postural dysfunction.   ACTIVITY LIMITATIONS: carrying, lifting, bending, standing, squatting, sleeping, stairs, transfers, bed mobility, bathing, toileting, dressing, reach over head, hygiene/grooming, and locomotion level  PARTICIPATION LIMITATIONS: meal prep, cleaning, laundry, shopping, community activity, and church  PERSONAL FACTORS: Age, Past/current experiences, Time since onset of injury/illness/exacerbation, and 3+ comorbidities: Fixed pupils, cerebral thombus with infarction, brain stem herniation, HTN,TBI s/p cranioplasty 2011, VP shunt  are also affecting patient's functional outcome.   REHAB POTENTIAL: Good  CLINICAL DECISION MAKING: Evolving/moderate complexity  EVALUATION COMPLEXITY: Moderate  PLAN:  PT FREQUENCY: 2x/week  PT DURATION: 4 weeks   PLANNED INTERVENTIONS: Therapeutic exercises, Therapeutic activity, Neuromuscular re-education, Balance training, Gait training, Patient/Family education, Self Care, Joint  mobilization, Stair training, Vestibular training, Canalith repositioning, DME instructions, Aquatic Therapy, Dry Needling, Electrical stimulation, Cryotherapy, Moist heat, Taping, Manual therapy, and Re-evaluation  PLAN FOR NEXT SESSION:  DC & HEP update on last visit; Work again on knee stability, check about lower/softer wedge that pt is wearing in R shoe; R LE spasticity management, aquatic therapy;progress HEP for LE stretching and balance   Land therapist:  Please print aquatic HEP above for patient and caregiver!  Camille Bal, PT, DPT 03/25/23 7:01 PM

## 2023-03-29 ENCOUNTER — Ambulatory Visit: Payer: BC Managed Care – PPO | Admitting: Occupational Therapy

## 2023-03-29 DIAGNOSIS — G8111 Spastic hemiplegia affecting right dominant side: Secondary | ICD-10-CM | POA: Diagnosis not present

## 2023-03-29 DIAGNOSIS — M25621 Stiffness of right elbow, not elsewhere classified: Secondary | ICD-10-CM

## 2023-03-29 DIAGNOSIS — M6281 Muscle weakness (generalized): Secondary | ICD-10-CM

## 2023-03-29 DIAGNOSIS — M25641 Stiffness of right hand, not elsewhere classified: Secondary | ICD-10-CM

## 2023-03-29 DIAGNOSIS — M25611 Stiffness of right shoulder, not elsewhere classified: Secondary | ICD-10-CM

## 2023-03-29 NOTE — Therapy (Signed)
OUTPATIENT OCCUPATIONAL THERAPY NEURO TREATMENT  Patient Name: Paul Kane MRN: [id number] DOB:02-10-1986, 37 y.o., male Today's Date: 03/29/2023  PCP: Bailey Mech, PA-C  REFERRING PROVIDER: Ranelle Oyster, MD  END OF SESSION:  OT End of Session - 03/29/23 1455     Visit Number 5    Number of Visits 9    Date for OT Re-Evaluation 04/30/23    Authorization Type BCBS / Medicaid-[place] Access 2024    Authorization - Visit Number 4    Authorization - Number of Visits 8    OT Start Time 1402    OT Stop Time 1444    OT Time Calculation (min) 42 min    Activity Tolerance Patient tolerated treatment well    Behavior During Therapy WFL for tasks assessed/performed                Past Medical History:  Diagnosis Date   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HCC)    Chronic hepatitis B (HCC)    Fixed pupils    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HCC)    Hypertension    Intracranial injury of other and unspecified nature, without mention of open intracranial wound, unspecified state of consciousness    Intracranial shunt    Paralysis (HCC)    Spastic hemiplegia affecting dominant side (HCC)    Stroke (HCC)    Traumatic brain injury (HCC)    Trouble swallowing    Visual disturbance    Weakness    Past Surgical History:  Procedure Laterality Date   cramiectomy     CRANIOPLASTY     REMOVAL OF GASTROSTOMY TUBE     VENTRICULOPERITONEAL SHUNT     Patient Active Problem List   Diagnosis Date Noted   SIRS due to infectious process with acute organ dysfunction (HCC) 03/14/2021   SIRS (systemic inflammatory response syndrome) (HCC) 03/12/2021   Syncope 03/12/2021   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) 03/12/2021   Hypokalemia 03/12/2021   Lactic acidosis 03/12/2021   Mixed hyperlipidemia 04/25/2018   Seizure disorder (HCC) 04/11/2018   Mechanical low back pain 05/12/2017   Dysarthria 12/20/2015   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/20/2012    Deafness, sensorineural 04/20/2012   Buzzing in ear 04/20/2012   Leaking percutaneous endoscopic gastrostomy (PEG) tube (HCC) 02/04/2012   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 pre-existing conscious levels, sequela (HCC) 10/12/2011   Cerebral infarct (HCC) 10/12/2011   Spastic hemiplegia of right dominant side due to noncerebrovascular etiology (HCC) 10/12/2011   Dysphagia 10/12/2011   Functioning G-tube 03/24/2011   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   Hemiplegia of dominant side (HCC) 07/28/2010   CEREBRAL HEMORRHAGE 07/28/2010   PERSONAL HISTORY OF TRAUMATIC BRAIN INJURY 07/02/2010   ALLERGIC RHINITIS CAUSE UNSPECIFIED 10/25/2008   GASTROENTERITIS 08/09/2008   CONTACT DERMATITIS&OTHER ECZEMA DUE DETERGENTS 08/18/2007   HEPATITIS B, CHRONIC 06/28/2007   Chronic viral hepatitis B without delta-agent (HCC) 06/28/2007    ONSET DATE: referral date 02/17/23  REFERRING DIAG: G81.11 (ICD-10-CM) - Spastic hemiplegia affecting right dominant side, unspecified etiology  THERAPY DIAG:  Spastic hemiplegia affecting right dominant side, unspecified etiology (HCC)  Muscle weakness (generalized)  Stiffness of right shoulder, not elsewhere classified  Stiffness of right elbow, not elsewhere classified  Stiffness of right hand, not elsewhere classified  Rationale for Evaluation and Treatment: Rehabilitation  SUBJECTIVE:   SUBJECTIVE STATEMENT: Pt reports "hurting" and pointing to R elbow.  Father reports that he fell about 2  weeks ago. Pt accompanied by: self and family member (father)  PERTINENT HISTORY:  1. History of traumatic brain injury with hydrocephalus, meningitis,   and right pontine stroke (2011) 2. Spastic tetraplegia, dominant side more affected  PRECAUTIONS: Fall and Other: h/o seizures  WEIGHT BEARING RESTRICTIONS: No  PAIN:  Are you having pain? No  FALLS: Has patient fallen in last 6 months? Yes. Number  of falls mother reports that "he falls a lot, but not lately"  LIVING ENVIRONMENT: Lives with: lives with their family Lives in: House/apartment Stairs: Yes: Internal: full flight to 2nd floor, but bedroom and bathroom on main floor and does not need to go up steps; can reach both and External: 3 steps; can reach both Has following equipment at home: shower chair and Grab bars  PLOF: Needs assistance with ADLs  PATIENT GOALS: increased use of R arm  OBJECTIVE:   HAND DOMINANCE: Right  ADLs: Mobility: Mod I around the house, hand held assist when in the community Grooming: Mod I  UB Dressing: Mod I LB Dressing: family assists with donning AFO and tying shoes, will assist with setup for clothing on RLE and then pt able to pull up Toileting: Mod I - utilizes bidet for hygiene Bathing: family assists with bathing, he completes ~90% Tub Shower transfers: Supervision/assist with shower transfers Equipment: Shower seat with back, Grab bars, and Walk in shower  IADLs: assists with housekeeping tasks with emptying dishwasher and folding clothes  MOBILITY STATUS: Hx of falls  UPPER EXTREMITY ROM:  noted R scapular winging and decreased scapular elevation  Active ROM Right eval Right PROM Right 03/29/23 PROM 03/29/23  Shoulder flexion 84  98 112  Shoulder abduction      Shoulder adduction      Shoulder extension      Shoulder internal rotation      Shoulder external rotation      Elbow flexion 140  135   Elbow extension -42  -42 -35  Wrist flexion unable 48 unable 56  Wrist extension 22 35 18 50  Wrist ulnar deviation      Wrist radial deviation      Wrist pronation      Wrist supination      (Blank rows = not tested)   HAND FUNCTION: Pt is able to manually open hand with great effort, most active movement is in index finger  COORDINATION: Unable to assess due to decreased grasp/release  MUSCLE TONE: RUE: RUE: Moderate, Hypertonic, and Modifed Ashworth Scale 3 =  Considerable increase in muscle tone, passive movement difficult  COGNITION: Overall cognitive status: Impaired and History of cognitive impairments - at baseline, mother present providing majority of information    TODAY'S TREATMENT:                                                                          DATE:  03/29/23 UE ROM: engaged in elbow extension and shoulder flexion pushing large therapy ball for increased shoulder flexion.  OT providing visual cue and manual facilitation for increased ROM to target and intermittent support to sustain stretch for 10 seconds.   PROM: OT attempting to facilitate AAROM for elbow extension, however despite attempts to incorporate use of table  top for further extension pt unable to complete without assistance.  OT providing facilitation and stretch with stabilization at elbow and then providing stretch.  Then transitioned to [facility] elbow flexion and extension with OT providing additional stretch for further extension.  OT providing stretch to wrist and digits while focusing on digit extension in conjunction with wrist flexion/extension.  WB through RUE: engaged in WB through R hand and extended elbow over half ball to facilitate increased tolerance of weight through wrist while facilitating increased elbow extension while reaching across midline with LUE for ring and then reaching back out to L to reach towards target facilitating increased elbow extension.  Pt requiring physical assistance from OT to maintain positioning of hand on half ball and cues at elbow for extension.    03/23/23 UE ROM: engaged in elbow extension and shoulder flexion pushing large therapy ball for increased shoulder flexion.  OT providing visual cue to facilitate increased ROM to target and intermittent support to sustain stretch for 3-5 seconds.  Engaged in elbow extension and shoulder flexion in supine with focus on L hand placement to further facilitate increased stretch and cues for  sustained stretch 5-10 seconds.   PROM: engaged in supine shoulder abduction and internal/external rotation.   OT providing verbal and tactile cues for sustained abduction during internal/external rotation.   WB through RUE: engaged in WB through R elbow with focus on maintaining R arm stable at ~45* abduction to use body on arm to increase stretch and gentle self-ROM of elbow with flexion/extension during reaching task.   03/15/23 Elbow ROM: Engaged in reaching towards floor with sustained stretch and extension, utilizing thigh for bracing and L hand at wrist to further facilitate stretch.  OT providing encouraging to hold position 15-30 seconds for prolonged stretch. UE ROM: engaged in WB through LUE over half ball to facilitate finger extension, wrist extension, and elbow flexion/extension while reaching across midline with LUE to further facilitate WB.  OT providing support at R hand and elbow to facilitate extension when reaching outside body to place items on L.  OT providing tactile cues and therapeutic touch to relax thumb IP hyperextension.  Progressed to elbow flexion/extension and shoulder flexion with use of cane.  OT providing facilitation at elbow and hand to maintain grasp and full ROM as tolerated. Wrist flexion/extension: engaged in PROM with therapist providing stretch through wrist flexion and extension.  Pt demonstrating increased tightness with wrist flexion, compared to extension.  OT also facilitating supination stretch, however extremely tight with little ROM.   PATIENT EDUCATION: Education details: ongoing condition specific education Person educated: Patient and Parent Education method: Explanation, Demonstration, Actor cues, and Handouts Education comprehension: needs further education  HOME EXERCISE PROGRAM: SRA lab Self-ROM shoulder and scapula exercises  Access Code: [email or web address] URL: [email or web address] Date: 03/15/2023 Prepared by: [facility] - Outpatient   Rehab - [facility]    GOALS: Goals reviewed with patient? Yes  SHORT TERM GOALS: Target date: 04/02/23  Pt and caregiver will be independent with HEP for shoulder and elbow ROM. Baseline: Pt not currently engaging in any ROM/stretching program Goal status: ONGOING   Pt will demonstrate improved active elbow extension to -35* to increase functional use Baseline: -42* Goal status: Not met - -42* on 03/29/23 with difficulty achieving any additional extension even with PROM this session   Pt will demonstrate improved shoulder flexion to 90* to increase functional use Baseline: 84* Goal status: MET - 96* on 03/29/23   LONG TERM GOALS: Target  date: 04/30/23  Pt will tolerate advanced HEP/stretching program for RUE. Baseline: No HEP at this time Goal status: ONGOING   Pt will demonstrate improved active elbow extension to -25* to increase functional use Baseline: -42* Goal status: ONGOING  Pt will demonstrate improved shoulder flexion to 110* to allow for improved ease with ADLs. Baseline: 84* Goal status: ONGOING   4.   Pt will be independent with splint wear and care PRN Baseline: splinting needs TBD Goal status: ONGOING   ASSESSMENT:  CLINICAL IMPRESSION: Pt demonstrating good tolerance to shoulder abduction and internal/external rotation.  Good use supine position to decrease compensatory trunk movements during PROM.  PERFORMANCE DEFICITS: in functional skills including ADLs, IADLs, coordination, dexterity, tone, ROM, strength, pain, flexibility, FMC, GMC, decreased knowledge of precautions, and UE functional use, cognitive skills including learn, memory, and safety awareness.    PLAN:  OT FREQUENCY: 1x/week  OT DURATION: 8 weeks  PLANNED INTERVENTIONS: self care/ADL training, therapeutic exercise, therapeutic activity, neuromuscular re-education, manual therapy, passive range of motion, aquatic therapy, splinting, moist heat, cryotherapy, and  patient/family education   RECOMMENDED OTHER SERVICES: may benefit from aquatic therapy OT  CONSULTED AND AGREED WITH PLAN OF CARE: Patient and family member/caregiver  PLAN FOR NEXT SESSION: Review HEP and begin PROM and self-ROM for shoulder and elbow ROM.  Assess previous splint and plan to fabricate in future session.    Rosalio Loud, OTR/L 03/29/2023, 2:56 PM

## 2023-03-30 ENCOUNTER — Ambulatory Visit: Payer: BC Managed Care – PPO | Admitting: Rehabilitation

## 2023-04-01 ENCOUNTER — Ambulatory Visit: Payer: BC Managed Care – PPO | Admitting: Physical Therapy

## 2023-04-06 ENCOUNTER — Encounter: Payer: Self-pay | Admitting: Rehabilitative and Restorative Service Providers"

## 2023-04-06 ENCOUNTER — Ambulatory Visit: Payer: BC Managed Care – PPO | Admitting: Occupational Therapy

## 2023-04-06 ENCOUNTER — Ambulatory Visit: Payer: BC Managed Care – PPO | Admitting: Rehabilitative and Restorative Service Providers"

## 2023-04-06 DIAGNOSIS — R2681 Unsteadiness on feet: Secondary | ICD-10-CM

## 2023-04-06 DIAGNOSIS — R2689 Other abnormalities of gait and mobility: Secondary | ICD-10-CM

## 2023-04-06 DIAGNOSIS — G8111 Spastic hemiplegia affecting right dominant side: Secondary | ICD-10-CM

## 2023-04-06 DIAGNOSIS — M6281 Muscle weakness (generalized): Secondary | ICD-10-CM

## 2023-04-06 DIAGNOSIS — M25621 Stiffness of right elbow, not elsewhere classified: Secondary | ICD-10-CM

## 2023-04-06 DIAGNOSIS — M25611 Stiffness of right shoulder, not elsewhere classified: Secondary | ICD-10-CM

## 2023-04-06 DIAGNOSIS — M25641 Stiffness of right hand, not elsewhere classified: Secondary | ICD-10-CM

## 2023-04-06 NOTE — Therapy (Signed)
OUTPATIENT OCCUPATIONAL THERAPY NEURO TREATMENT  Patient Name: Paul Kane MRN: [id number] DOB:Oct 18, 1985, 37 y.o., male Today's Date: 04/06/2023  PCP: Bailey Mech, PA-C  REFERRING PROVIDER: Ranelle Oyster, MD  END OF SESSION:  OT End of Session - 04/06/23 1027     Visit Number 6    Number of Visits 9    Date for OT Re-Evaluation 04/30/23    Authorization Type BCBS / Medicaid-[place] Access 2024    Authorization - Visit Number 5    Authorization - Number of Visits 8    OT Start Time 1021   time received from PT   OT Stop Time 1101    OT Time Calculation (min) 40 min    Activity Tolerance Patient tolerated treatment well    Behavior During Therapy WFL for tasks assessed/performed                 Past Medical History:  Diagnosis Date   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HCC)    Chronic hepatitis B (HCC)    Fixed pupils    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HCC)    Hypertension    Intracranial injury of other and unspecified nature, without mention of open intracranial wound, unspecified state of consciousness    Intracranial shunt    Paralysis (HCC)    Spastic hemiplegia affecting dominant side (HCC)    Stroke (HCC)    Traumatic brain injury (HCC)    Trouble swallowing    Visual disturbance    Weakness    Past Surgical History:  Procedure Laterality Date   cramiectomy     CRANIOPLASTY     REMOVAL OF GASTROSTOMY TUBE     VENTRICULOPERITONEAL SHUNT     Patient Active Problem List   Diagnosis Date Noted   SIRS due to infectious process with acute organ dysfunction (HCC) 03/14/2021   SIRS (systemic inflammatory response syndrome) (HCC) 03/12/2021   Syncope 03/12/2021   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) 03/12/2021   Hypokalemia 03/12/2021   Lactic acidosis 03/12/2021   Mixed hyperlipidemia 04/25/2018   Seizure disorder (HCC) 04/11/2018   Mechanical low back pain 05/12/2017   Dysarthria 12/20/2015   CD (conductive  deafness) 04/20/2012   Deafness, sensorineural 04/20/2012   Buzzing in ear 04/20/2012   Leaking percutaneous endoscopic gastrostomy (PEG) tube (HCC) 02/04/2012   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 pre-existing conscious levels, sequela (HCC) 10/12/2011   Cerebral infarct (HCC) 10/12/2011   Spastic hemiplegia of right dominant side due to noncerebrovascular etiology (HCC) 10/12/2011   Dysphagia 10/12/2011   Functioning G-tube 03/24/2011   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   Hemiplegia of dominant side (HCC) 07/28/2010   CEREBRAL HEMORRHAGE 07/28/2010   PERSONAL HISTORY OF TRAUMATIC BRAIN INJURY 07/02/2010   ALLERGIC RHINITIS CAUSE UNSPECIFIED 10/25/2008   GASTROENTERITIS 08/09/2008   CONTACT DERMATITIS&OTHER ECZEMA DUE DETERGENTS 08/18/2007   HEPATITIS B, CHRONIC 06/28/2007   Chronic viral hepatitis B without delta-agent (HCC) 06/28/2007    ONSET DATE: referral date 02/17/23  REFERRING DIAG: G81.11 (ICD-10-CM) - Spastic hemiplegia affecting right dominant side, unspecified etiology  THERAPY DIAG:  Spastic hemiplegia affecting right dominant side, unspecified etiology (HCC)  Muscle weakness (generalized)  Stiffness of right shoulder, not elsewhere classified  Stiffness of right elbow, not elsewhere classified  Stiffness of right hand, not elsewhere classified  Rationale for Evaluation and Treatment: Rehabilitation  SUBJECTIVE:   SUBJECTIVE STATEMENT: Pt's father reports that he fell since the last visit  and fell on his R elbow.   Pt accompanied by: self and family member (father)  PERTINENT HISTORY:  1. History of traumatic brain injury with hydrocephalus, meningitis,   and right pontine stroke (2011) 2. Spastic tetraplegia, dominant side more affected  PRECAUTIONS: Fall and Other: h/o seizures  WEIGHT BEARING RESTRICTIONS: No  PAIN:  Are you having pain? No  FALLS: Has patient fallen in last 6 months?  Yes. Number of falls mother reports that "he falls a lot, but not lately"  LIVING ENVIRONMENT: Lives with: lives with their family Lives in: House/apartment Stairs: Yes: Internal: full flight to 2nd floor, but bedroom and bathroom on main floor and does not need to go up steps; can reach both and External: 3 steps; can reach both Has following equipment at home: shower chair and Grab bars  PLOF: Needs assistance with ADLs  PATIENT GOALS: increased use of R arm  OBJECTIVE:   HAND DOMINANCE: Right  ADLs: Mobility: Mod I around the house, hand held assist when in the community Grooming: Mod I  UB Dressing: Mod I LB Dressing: family assists with donning AFO and tying shoes, will assist with setup for clothing on RLE and then pt able to pull up Toileting: Mod I - utilizes bidet for hygiene Bathing: family assists with bathing, he completes ~90% Tub Shower transfers: Supervision/assist with shower transfers Equipment: Shower seat with back, Grab bars, and Walk in shower  IADLs: assists with housekeeping tasks with emptying dishwasher and folding clothes  MOBILITY STATUS: Hx of falls  UPPER EXTREMITY ROM:  noted R scapular winging and decreased scapular elevation  Active ROM Right eval Right PROM Right 03/29/23 PROM 03/29/23  Shoulder flexion 84  98 112  Shoulder abduction      Shoulder adduction      Shoulder extension      Shoulder internal rotation      Shoulder external rotation      Elbow flexion 140  135   Elbow extension -42  -42 -35  Wrist flexion unable 48 unable 56  Wrist extension 22 35 18 50  Wrist ulnar deviation      Wrist radial deviation      Wrist pronation      Wrist supination      (Blank rows = not tested)   HAND FUNCTION: Pt is able to manually open hand with great effort, most active movement is in index finger  COORDINATION: Unable to assess due to decreased grasp/release  MUSCLE TONE: RUE: RUE: Moderate, Hypertonic, and Modifed Ashworth Scale 3  = Considerable increase in muscle tone, passive movement difficult  COGNITION: Overall cognitive status: Impaired and History of cognitive impairments - at baseline, mother present providing majority of information    TODAY'S TREATMENT:                                                                          DATE:  04/06/23 Palpated elbow and surrounding area s/p fall last week, elbow boggy and with abrasion but no c/o pain to palpation or with movements.  Discussed elbow protector (heelbo sleeve) and provided handout with options to order for protection due to high fall risk and frequently falling onto R elbow when falling. Elbow  extension: engaged in [place] with use of cane to facilitate increased elbow extension.  OT providing hand over hand assist to facilitate sustained hold at end range.  Pt able to tolerate extension to -30* this session.  OT providing target for pt to reach towards to facilitate increased ROM. Functional reach: transitioned to more functional reach with pt able to grasp onto cone with assist from other hand to further open hand.  Pt demonstrating good active shoulder flexion and elbow extension to place onto target.  OT providing min tactile cues for trunk control to further facilitate UE ROM.  OT providing mod facilitation for elbow extension during reaching into various planes. Stretch: worked on reaching with gravity downward toward AFO/top of shoe. Patient able to touch to laces on shoe.OT providing min cues to sustain stretch for ~10 seconds before returning to upright sitting.   03/29/23 UE ROM: engaged in elbow extension and shoulder flexion pushing large therapy ball for increased shoulder flexion.  OT providing visual cue and manual facilitation for increased ROM to target and intermittent support to sustain stretch for 10 seconds.   PROM: OT attempting to facilitate AAROM for elbow extension, however despite attempts to incorporate use of table top for further  extension pt unable to complete without assistance.  OT providing facilitation and stretch with stabilization at elbow and then providing stretch.  Then transitioned to [facility] elbow flexion and extension with OT providing additional stretch for further extension.  OT providing stretch to wrist and digits while focusing on digit extension in conjunction with wrist flexion/extension.  WB through RUE: engaged in WB through R hand and extended elbow over half ball to facilitate increased tolerance of weight through wrist while facilitating increased elbow extension while reaching across midline with LUE for ring and then reaching back out to L to reach towards target facilitating increased elbow extension.  Pt requiring physical assistance from OT to maintain positioning of hand on half ball and cues at elbow for extension.    03/23/23 UE ROM: engaged in elbow extension and shoulder flexion pushing large therapy ball for increased shoulder flexion.  OT providing visual cue to facilitate increased ROM to target and intermittent support to sustain stretch for 3-5 seconds.  Engaged in elbow extension and shoulder flexion in supine with focus on L hand placement to further facilitate increased stretch and cues for sustained stretch 5-10 seconds.   PROM: engaged in supine shoulder abduction and internal/external rotation.   OT providing verbal and tactile cues for sustained abduction during internal/external rotation.   WB through RUE: engaged in WB through R elbow with focus on maintaining R arm stable at ~45* abduction to use body on arm to increase stretch and gentle self-ROM of elbow with flexion/extension during reaching task.   PATIENT EDUCATION: Education details: ongoing condition specific education Person educated: Patient and Parent Education method: Explanation, Demonstration, Actor cues, and Handouts Education comprehension: needs further education  HOME EXERCISE PROGRAM: SRA lab Self-ROM  shoulder and scapula exercises  Access Code: [email or web address] URL: [email or web address] Date: 03/15/2023 Prepared by: [facility] - Outpatient  Rehab - [facility]    GOALS: Goals reviewed with patient? Yes  SHORT TERM GOALS: Target date: 04/02/23  Pt and caregiver will be independent with HEP for shoulder and elbow ROM. Baseline: Pt not currently engaging in any ROM/stretching program Goal status: ONGOING   Pt will demonstrate improved active elbow extension to -35* to increase functional use Baseline: -42* Goal status: Not met - -42* on 03/29/23  with difficulty achieving any additional extension even with PROM this session   Pt will demonstrate improved shoulder flexion to 90* to increase functional use Baseline: 84* Goal status: MET - 96* on 03/29/23   LONG TERM GOALS: Target date: 04/30/23  Pt will tolerate advanced HEP/stretching program for RUE. Baseline: No HEP at this time Goal status: ONGOING   Pt will demonstrate improved active elbow extension to -25* to increase functional use Baseline: -42* Goal status: ONGOING  Pt will demonstrate improved shoulder flexion to 110* to allow for improved ease with ADLs. Baseline: 84* Goal status: ONGOING   4.   Pt will be independent with splint wear and care PRN Baseline: splinting needs TBD Goal status: ONGOING   ASSESSMENT:  CLINICAL IMPRESSION: Pt demonstrating increased compensatory trunk movements during PROM and attempts at [facility] this session, benefiting from facilitation for elbow extension and tactile cues for posture.   PERFORMANCE DEFICITS: in functional skills including ADLs, IADLs, coordination, dexterity, tone, ROM, strength, pain, flexibility, FMC, GMC, decreased knowledge of precautions, and UE functional use, cognitive skills including learn, memory, and safety awareness.    PLAN:  OT FREQUENCY: 1x/week  OT DURATION: 8 weeks  PLANNED INTERVENTIONS: self care/ADL training, therapeutic  exercise, therapeutic activity, neuromuscular re-education, manual therapy, passive range of motion, aquatic therapy, splinting, moist heat, cryotherapy, and patient/family education   RECOMMENDED OTHER SERVICES: may benefit from aquatic therapy OT  CONSULTED AND AGREED WITH PLAN OF CARE: Patient and family member/caregiver  PLAN FOR NEXT SESSION: Review HEP and begin PROM and self-ROM for shoulder and elbow ROM.  Assess previous splint and plan to fabricate in future session.    Rosalio Loud, OTR/L 04/06/2023, 10:27 AM

## 2023-04-06 NOTE — Therapy (Signed)
OUTPATIENT PHYSICAL THERAPY NEURO TREATMENT and DISCHARGE SUMMARY   Patient Name: Paul Kane MRN: [id number] DOB:[birth year], 37 y.o., male Today's Date: 04/06/2023   PCP: Bailey Mech, PA-C  REFERRING PROVIDER: Ranelle Oyster, MD  PHYSICAL THERAPY DISCHARGE SUMMARY  Visits from Start of Care: 12  Current functional level related to goals / functional outcomes: See goals below for current patient status.   Remaining deficits: See goals below-- patient continues with risk for falls per Sharlene Motts, dec'd R LE motor control, dec'd R ankle DF ROM   Education / Equipment: HEP on land and in the pool with family assisting Discussed current AFO and due to low height on the posterior leg, it will not provide enough support to control the knee. It does reduce ankle inversion.    Patient agrees to discharge. Patient goals were partially met. Patient is being discharged due to meeting the stated rehab goals.   END OF SESSION:  PT End of Session - 04/06/23 0935     Visit Number 12    Number of Visits 12    Date for PT Re-Evaluation 04/16/23    Authorization Type BCBS/Medicaid  (approved 8 visits 8/20-9/16)    Authorization Time Period reauth submitted at 03/02/2023 visit    Authorization - Visit Number 8    Authorization - Number of Visits 8    PT Start Time 0930    PT Stop Time 1014    PT Time Calculation (min) 44 min    Activity Tolerance Patient tolerated treatment well    Behavior During Therapy WFL for tasks assessed/performed             Past Medical History:  Diagnosis Date   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HCC)    Chronic hepatitis B (HCC)    Fixed pupils    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HCC)    Hypertension    Intracranial injury of other and unspecified nature, without mention of open intracranial wound, unspecified state of consciousness    Intracranial shunt    Paralysis (HCC)    Spastic hemiplegia affecting dominant side (HCC)     Stroke (HCC)    Traumatic brain injury (HCC)    Trouble swallowing    Visual disturbance    Weakness    Past Surgical History:  Procedure Laterality Date   cramiectomy     CRANIOPLASTY     REMOVAL OF GASTROSTOMY TUBE     VENTRICULOPERITONEAL SHUNT     Patient Active Problem List   Diagnosis Date Noted   SIRS due to infectious process with acute organ dysfunction (HCC) 03/14/2021   SIRS (systemic inflammatory response syndrome) (HCC) 03/12/2021   Syncope 03/12/2021   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) 03/12/2021   Hypokalemia 03/12/2021   Lactic acidosis 03/12/2021   Mixed hyperlipidemia 04/25/2018   Seizure disorder (HCC) 04/11/2018   Mechanical low back pain 05/12/2017   Dysarthria 12/20/2015   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/20/2012   Deafness, sensorineural 04/20/2012   Buzzing in ear 04/20/2012   Leaking percutaneous endoscopic gastrostomy (PEG) tube (HCC) 02/04/2012   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 pre-existing conscious levels, sequela (HCC) 10/12/2011   Cerebral infarct (HCC) 10/12/2011   Spastic hemiplegia of right dominant side due to noncerebrovascular etiology (HCC) 10/12/2011   Dysphagia 10/12/2011   Functioning G-tube 03/24/2011   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   Hemiplegia of dominant side (HCC) 07/28/2010   CEREBRAL HEMORRHAGE 07/28/2010  PERSONAL HISTORY OF TRAUMATIC BRAIN INJURY 07/02/2010   ALLERGIC RHINITIS CAUSE UNSPECIFIED 10/25/2008   GASTROENTERITIS 08/09/2008   CONTACT DERMATITIS&OTHER ECZEMA DUE DETERGENTS 08/18/2007   HEPATITIS B, CHRONIC 06/28/2007   Chronic viral hepatitis B without delta-agent (HCC) 06/28/2007    ONSET DATE: 2012  REFERRING DIAG: G81.11 (ICD-10-CM) - Spastic hemiplegia of right dominant side due to noncerebrovascular etiology (HCC)  THERAPY DIAG:  Spastic hemiplegia affecting right dominant side, unspecified etiology (HCC)  Muscle  weakness (generalized)  Unsteadiness on feet  Other abnormalities of gait and mobility  Rationale for Evaluation and Treatment: Rehabilitation  SUBJECTIVE:                                                                                                                                                                                             SUBJECTIVE STATEMENT: Dad presents therapy with Romy. They are members at [employer] and will be using the pool for continued exercise.  Pt accompanied by: family member (father)  PERTINENT HISTORY: Fixed pupils, cerebral thombus with infarction, brain stem herniation, HTN,TBI s/p cranioplasty, VP shunt  PAIN:  Are you having pain? No  PRECAUTIONS: Fall and Other: VP shunt  WEIGHT BEARING RESTRICTIONS: No   FALLS: Has patient fallen in last 6 months? No  LIVING ENVIRONMENT: Lives with: lives with their family Lives in: House/apartment Stairs:  3 steps to enter; 2nd floor with B handrails Has following equipment at home: Wheelchair (manual) and Tour manager; also has [name]  PATIENT GOALS: improve R ankle mobility and balance   OBJECTIVE:  [facility] Adult PT Treatment:                                                DATE: 04/06/23 Therapeutic Exercise: Reviewed HEP Seated Hamstring stretch Gastroc stretch Standing Marching with one UE support Squats to chair touch  Supine SLR x 10 reps with tactile and target cues for height to lift Marching Attempted piriformis stretch from HEP, but patient does not tolerate well Lumbar rocking Gait: Curb negotiation stepping up onto 6" step with one UE support-- without support, patient needs to  have assist    PATIENT EDUCATION: Education details: Aquatic wrap-up, will send new aquatic HEP for printing to next scheduled land therapist.  Discussed [facility] housekeeping regarding maintaining pt modesty in facility during changing.  Education regarding guarding patient by not pulling on hemiparetic  arm especially with elevated fall risk when not wearing AFO in aquatic environment - pt catches  toe on hemiparetic side x3 during entering and exiting pool area requiring minA to steady from therapist. Person educated: Patient and Parent Education method: Explanation Education comprehension: verbalized understanding  HOME EXERCISE PROGRAM Access Code: [email or web address] URL: [email or web address] Date: 04/06/2023 Prepared by: Margretta Ditty  Exercises - Seated Gastroc Stretch with Strap  - 1 x daily - 5 x weekly - 2 sets - 30  hold - Seated Hamstring Stretch  - 1 x daily - 5 x weekly - 2 sets - 30 sec hold - Supine Active Straight Leg Raise  - 1 x daily - 7 x weekly - 1 sets - 10 reps - Squat with Chair Touch  - 1 x daily - 7 x weekly - 2 sets - 10 reps  AQUATIC HEP: Continue hamstring and heel cord stretch as already independent with!  Continue floating for spasticity management (supporting head and upper body with 2 noodles and caregiver), can continue wall push-off so long as head does not go under water!  Can march in place or across pool.  Access Code: [email or web address] URL: [email or web address] Date: 03/25/2023 Prepared by: Camille Bal  Exercises - Heel Toe Raises at [facility]  - 1 x daily - 7 x weekly - 3 sets - 10 reps - Standing March at [facility]  - 1 x daily - 7 x weekly - 3 sets - 10 reps - Standing Hip Abduction Adduction at Pool Wall  - 1 x daily - 7 x weekly - 3 sets - 10 reps - Forward and Backward Stepping at [employer]  - 1 x daily - 7 x weekly - 3 sets - 10 reps - Lunge to Target at [employer]  - 1 x daily - 7 x weekly - 3 sets - 10 reps - Lateral Stepping at Pool Wall  - 1 x daily - 7 x weekly - 3 sets - 10 reps - Lateral Weight Shift with Step at Pool Wall  - 1 x daily - 7 x weekly - 3 sets - 10 reps   ________________________________________________________________________________________________________ Below measures were taken at time of  initial evaluation unless otherwise specified:  DIAGNOSTIC FINDINGS: none recent   COGNITION: Overall cognitive status: History of cognitive impairments - at baseline   SENSATION: WFL; reports reduced sensation in R UE  COORDINATION: Alternating pronation/supination: unable on R UE d/t stiffness Alternating toe tap: unable to coordinate alt motions   EDEMA:  none  MUSCLE TONE: RLE: mild in L ankle, severe in R ankle   POSTURE:  rounded, R elbow flexed   LOWER EXTREMITY ROM:     Active  Right Eval Left Eval  Hip flexion    Hip extension    Hip abduction    Hip adduction    Hip internal rotation    Hip external rotation    Knee flexion    Knee extension    Ankle dorsiflexion 5 14  Ankle plantarflexion    Ankle inversion    Ankle eversion     (Blank rows = not tested)  LOWER EXTREMITY MMT:    MMT Right Eval Left Eval  Hip flexion 4 4+  Hip extension    Hip abduction 4 4+  Hip adduction 4 4+  Hip internal rotation    Hip external rotation    Knee flexion 4 5  Knee extension 4 5  Ankle dorsiflexion 3+ 4+  Ankle plantarflexion 4 4+  Ankle inversion    Ankle eversion    (Blank rows = not tested)  GAIT: Gait pattern: limited R knee flexion during swing, short step length and foot clearance, visible R ankle inversion tone during swing  Assistive device utilized: None Level of assistance:  HHA from mother   FUNCTIONAL TESTS:  Sharlene Motts: 41/56   GOALS: Goals reviewed with patient? Yes  SHORT TERM GOALS: Target date: 03/10/2023  Patient to be independent with initial HEP. Baseline: HEP initiated Goal status: MET    LONG TERM GOALS: Target date: 04/16/2023  Patient to be independent with advanced HEP. Baseline: Initial stretching HEP initiated; will need progression of HEP with additional visits 03/02/2023 Goal status: MET on 04/06/23  Patient to demonstrate at least 8 degrees R ankle dorsiflexion AROM to improve safety with ambulation. Baseline: 5 deg;  8-10 degrees P/ROM; 2 degrees A/AROM 03/02/2023; 4 deg 03/23/23 Goal status:NOT MET-- remains at 5 deg  Patient/mother to report 50% improvement in wear time/frequency of R AFO for max safety. Baseline:wears R ankle brace for outdoors, not in home, per mom report 03/02/2023; Father reports that the pt wears AFO only when out of the house 03/23/23 Goal status:PARTIALLY MET- pt uses AFO for community gait.   Patient to demonstrate safe curb navigation with supervision/CGA only.  Baseline:  (stair negotiation with step to pattern; have not attempted curb) 03/02/2023; provided min A for curbs 03/23/23 Goal status: NOT MET -- pt needs HHA for safety  Patient to ambulate 200 ft safely with supervision.   Baseline: HHA with mother at eval; gait 85 ft x 2 with min guard 03/02/2023; 168ft outside with close CGA Goal status: NOT MET -- pt can walk short distances with supervision, but does reach for HHA occasionally.  Patient to score at least 45/56 on Berg in order to decrease risk of falls.  Baseline: 41 at eval; 41 03/23/23 Goal status: NOT MET 41/56 last captured on 9/3  ASSESSMENT:  CLINICAL IMPRESSION: The patient has partially met one LTG and met one LTG. He did not meet Berg goals, ankle ROM, and extended gait and curb negotiation with supervision. Patient has chronic deficits and remains a high fall risk. PT and patient's father reviewed HEP with patient and made small modifications. We also discussed pool exercises and the handout was provided. The patient is to continue current HEP with family support and return to therapy as mobility needs arise or changes in status arise.  OBJECTIVE IMPAIRMENTS: Abnormal gait, decreased activity tolerance, decreased balance, decreased coordination, difficulty walking, decreased ROM, decreased strength, increased muscle spasms, impaired flexibility, impaired tone, and postural dysfunction.   PLAN:  PT FREQUENCY: 2x/week  PT DURATION: 4 weeks   PLANNED  INTERVENTIONS: Therapeutic exercises, Therapeutic activity, Neuromuscular re-education, Balance training, Gait training, Patient/Family education, Self Care, Joint mobilization, Stair training, Vestibular training, Canalith repositioning, DME instructions, Aquatic Therapy, Dry Needling, Electrical stimulation, Cryotherapy, Moist heat, Taping, Manual therapy, and Re-evaluation  PLAN FOR NEXT SESSION:  DC   Camille Bal, PT, DPT 04/06/23 9:35 AM

## 2023-04-13 ENCOUNTER — Ambulatory Visit: Payer: BC Managed Care – PPO | Admitting: Occupational Therapy

## 2023-04-13 DIAGNOSIS — M6281 Muscle weakness (generalized): Secondary | ICD-10-CM

## 2023-04-13 DIAGNOSIS — M25621 Stiffness of right elbow, not elsewhere classified: Secondary | ICD-10-CM

## 2023-04-13 DIAGNOSIS — M25611 Stiffness of right shoulder, not elsewhere classified: Secondary | ICD-10-CM

## 2023-04-13 DIAGNOSIS — G8111 Spastic hemiplegia affecting right dominant side: Secondary | ICD-10-CM

## 2023-04-13 DIAGNOSIS — M25641 Stiffness of right hand, not elsewhere classified: Secondary | ICD-10-CM

## 2023-04-13 DIAGNOSIS — R2681 Unsteadiness on feet: Secondary | ICD-10-CM

## 2023-04-13 NOTE — Therapy (Signed)
OUTPATIENT OCCUPATIONAL THERAPY NEURO TREATMENT  Patient Name: Paul Kane MRN: [id number] DOB:05-11-1986, 37 y.o., male Today's Date: 04/13/2023  PCP: Bailey Mech, PA-C  REFERRING PROVIDER: Ranelle Oyster, MD  END OF SESSION:  OT End of Session - 04/13/23 1049     Visit Number 7    Number of Visits 9    Date for OT Re-Evaluation 04/30/23    Authorization Type BCBS / Medicaid-[place] Access 2024    Authorization - Visit Number 6    Authorization - Number of Visits 8    OT Start Time 1019    OT Stop Time 1100    OT Time Calculation (min) 41 min    Activity Tolerance Patient tolerated treatment well    Behavior During Therapy WFL for tasks assessed/performed                  Past Medical History:  Diagnosis Date   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HCC)    Chronic hepatitis B (HCC)    Fixed pupils    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HCC)    Hypertension    Intracranial injury of other and unspecified nature, without mention of open intracranial wound, unspecified state of consciousness    Intracranial shunt    Paralysis (HCC)    Spastic hemiplegia affecting dominant side (HCC)    Stroke (HCC)    Traumatic brain injury (HCC)    Trouble swallowing    Visual disturbance    Weakness    Past Surgical History:  Procedure Laterality Date   cramiectomy     CRANIOPLASTY     REMOVAL OF GASTROSTOMY TUBE     VENTRICULOPERITONEAL SHUNT     Patient Active Problem List   Diagnosis Date Noted   SIRS due to infectious process with acute organ dysfunction (HCC) 03/14/2021   SIRS (systemic inflammatory response syndrome) (HCC) 03/12/2021   Syncope 03/12/2021   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) 03/12/2021   Hypokalemia 03/12/2021   Lactic acidosis 03/12/2021   Mixed hyperlipidemia 04/25/2018   Seizure disorder (HCC) 04/11/2018   Mechanical low back pain 05/12/2017   Dysarthria 12/20/2015   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/20/2012    Deafness, sensorineural 04/20/2012   Buzzing in ear 04/20/2012   Leaking percutaneous endoscopic gastrostomy (PEG) tube (HCC) 02/04/2012   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 pre-existing conscious levels, sequela (HCC) 10/12/2011   Cerebral infarct (HCC) 10/12/2011   Spastic hemiplegia of right dominant side due to noncerebrovascular etiology (HCC) 10/12/2011   Dysphagia 10/12/2011   Functioning G-tube 03/24/2011   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   Hemiplegia of dominant side (HCC) 07/28/2010   CEREBRAL HEMORRHAGE 07/28/2010   PERSONAL HISTORY OF TRAUMATIC BRAIN INJURY 07/02/2010   ALLERGIC RHINITIS CAUSE UNSPECIFIED 10/25/2008   GASTROENTERITIS 08/09/2008   CONTACT DERMATITIS&OTHER ECZEMA DUE DETERGENTS 08/18/2007   HEPATITIS B, CHRONIC 06/28/2007   Chronic viral hepatitis B without delta-agent (HCC) 06/28/2007    ONSET DATE: referral date 02/17/23  REFERRING DIAG: G81.11 (ICD-10-CM) - Spastic hemiplegia affecting right dominant side, unspecified etiology  THERAPY DIAG:  Spastic hemiplegia affecting right dominant side, unspecified etiology (HCC)  Muscle weakness (generalized)  Unsteadiness on feet  Stiffness of right shoulder, not elsewhere classified  Stiffness of right elbow, not elsewhere classified  Stiffness of right hand, not elsewhere classified  Rationale for Evaluation and Treatment: Rehabilitation  SUBJECTIVE:   SUBJECTIVE STATEMENT: Pt reports that his legs are sore. Pt accompanied by:  self and family member (father)  PERTINENT HISTORY:  1. History of traumatic brain injury with hydrocephalus, meningitis,   and right pontine stroke (2011) 2. Spastic tetraplegia, dominant side more affected  PRECAUTIONS: Fall and Other: h/o seizures  WEIGHT BEARING RESTRICTIONS: No  PAIN:  Are you having pain? Reports "sore" legs.   FALLS: Has patient fallen in last 6 months? Yes. Number of falls  mother reports that "he falls a lot, but not lately"  LIVING ENVIRONMENT: Lives with: lives with their family Lives in: House/apartment Stairs: Yes: Internal: full flight to 2nd floor, but bedroom and bathroom on main floor and does not need to go up steps; can reach both and External: 3 steps; can reach both Has following equipment at home: shower chair and Grab bars  PLOF: Needs assistance with ADLs  PATIENT GOALS: increased use of R arm  OBJECTIVE:   HAND DOMINANCE: Right  ADLs: Mobility: Mod I around the house, hand held assist when in the community Grooming: Mod I  UB Dressing: Mod I LB Dressing: family assists with donning AFO and tying shoes, will assist with setup for clothing on RLE and then pt able to pull up Toileting: Mod I - utilizes bidet for hygiene Bathing: family assists with bathing, he completes ~90% Tub Shower transfers: Supervision/assist with shower transfers Equipment: Shower seat with back, Grab bars, and Walk in shower  IADLs: assists with housekeeping tasks with emptying dishwasher and folding clothes  MOBILITY STATUS: Hx of falls  UPPER EXTREMITY ROM:  noted R scapular winging and decreased scapular elevation  Active ROM Right eval Right PROM Right 03/29/23 PROM 03/29/23  Shoulder flexion 84  98 112  Shoulder abduction      Shoulder adduction      Shoulder extension      Shoulder internal rotation      Shoulder external rotation      Elbow flexion 140  135   Elbow extension -42  -42 -35  Wrist flexion unable 48 unable 56  Wrist extension 22 35 18 50  Wrist ulnar deviation      Wrist radial deviation      Wrist pronation      Wrist supination      (Blank rows = not tested)   HAND FUNCTION: Pt is able to manually open hand with great effort, most active movement is in index finger  COORDINATION: Unable to assess due to decreased grasp/release  MUSCLE TONE: RUE: RUE: Moderate, Hypertonic, and Modifed Ashworth Scale 3 = Considerable  increase in muscle tone, passive movement difficult  COGNITION: Overall cognitive status: Impaired and History of cognitive impairments - at baseline, mother present providing majority of information    TODAY'S TREATMENT:                                                                          DATE:  04/13/23 UBE: Engaged in 6 mins total on 1.5 resistance, alternating 2 mins forward, 2 mins backward, 2 mins forward.  OT utilizing coban and hand over hand to maintain grasp on handle, especially when "pedaling" backwards.  Pt demonstrating improved pace and elbow extension when "pedaling" forwards.   Elbow extension: engaged in [place] with towel slides with hand over  hand reaching towards target to further facilitate increased R elbow extension.  OT then incorporating colored targets to facilitate increased precise movements when reaching towards specific target.   PROM: OT providing passive ROM and stretch to wrist and digits.  Pt demonstrating tone in R wrist impacting ease of wrist flexion, however demonstrating decreased tone and resistance with extension as well as finger extension. AAROM: NMR exercises in supine for increased scapular support and gravity-minimized/eliminated positioning.  Engaged in shoulder flexion and chest press in supine position with dowel in BUE to facilitate increased elbow extension and shoulder flexion.  Therapist providing intermittent tactile cues to R elbow to further facilitate movement.  Incorporated horizontal abduction/adduction into ROM with OT providing intermittent cues for extension and abduction.   04/06/23 Palpated elbow and surrounding area s/p fall last week, elbow boggy and with abrasion but no c/o pain to palpation or with movements.  Discussed elbow protector (heelbo sleeve) and provided handout with options to order for protection due to high fall risk and frequently falling onto R elbow when falling. Elbow extension: engaged in [place] with use of cane  to facilitate increased elbow extension.  OT providing hand over hand assist to facilitate sustained hold at end range.  Pt able to tolerate extension to -30* this session.  OT providing target for pt to reach towards to facilitate increased ROM. Functional reach: transitioned to more functional reach with pt able to grasp onto cone with assist from other hand to further open hand.  Pt demonstrating good active shoulder flexion and elbow extension to place onto target.  OT providing min tactile cues for trunk control to further facilitate UE ROM.  OT providing mod facilitation for elbow extension during reaching into various planes. Stretch: worked on reaching with gravity downward toward AFO/top of shoe. Patient able to touch to laces on shoe.OT providing min cues to sustain stretch for ~10 seconds before returning to upright sitting.   03/29/23 UE ROM: engaged in elbow extension and shoulder flexion pushing large therapy ball for increased shoulder flexion.  OT providing visual cue and manual facilitation for increased ROM to target and intermittent support to sustain stretch for 10 seconds.   PROM: OT attempting to facilitate AAROM for elbow extension, however despite attempts to incorporate use of table top for further extension pt unable to complete without assistance.  OT providing facilitation and stretch with stabilization at elbow and then providing stretch.  Then transitioned to [facility] elbow flexion and extension with OT providing additional stretch for further extension.  OT providing stretch to wrist and digits while focusing on digit extension in conjunction with wrist flexion/extension.  WB through RUE: engaged in WB through R hand and extended elbow over half ball to facilitate increased tolerance of weight through wrist while facilitating increased elbow extension while reaching across midline with LUE for ring and then reaching back out to L to reach towards target facilitating increased elbow  extension.  Pt requiring physical assistance from OT to maintain positioning of hand on half ball and cues at elbow for extension.    PATIENT EDUCATION: Education details: ongoing condition specific education Person educated: Patient and Parent Education method: Explanation, Demonstration, Actor cues, and Handouts Education comprehension: needs further education  HOME EXERCISE PROGRAM: SRA lab Self-ROM shoulder and scapula exercises  Access Code: [email or web address] URL: [email or web address] Date: 03/15/2023 Prepared by: [facility] - Outpatient  Rehab - [facility]    GOALS: Goals reviewed with patient? Yes  SHORT TERM GOALS: Target date: 04/02/23  Pt and caregiver will be independent with HEP for shoulder and elbow ROM. Baseline: Pt not currently engaging in any ROM/stretching program Goal status: ONGOING   Pt will demonstrate improved active elbow extension to -35* to increase functional use Baseline: -42* Goal status: Not met - -42* on 03/29/23 with difficulty achieving any additional extension even with PROM this session   Pt will demonstrate improved shoulder flexion to 90* to increase functional use Baseline: 84* Goal status: MET - 96* on 03/29/23   LONG TERM GOALS: Target date: 04/30/23  Pt will tolerate advanced HEP/stretching program for RUE. Baseline: No HEP at this time Goal status: ONGOING   Pt will demonstrate improved active elbow extension to -25* to increase functional use Baseline: -42* Goal status: ONGOING  Pt will demonstrate improved shoulder flexion to 110* to allow for improved ease with ADLs. Baseline: 84* Goal status: ONGOING   4.   Pt will be independent with splint wear and care PRN Baseline: splinting needs TBD Goal status: ONGOING   ASSESSMENT:  CLINICAL IMPRESSION: Pt tolerating increased focus on elbow flexion/extension with R hand wrapped with coban around UBE handle and again around dowel to further facilitate ROM.  Pt  benefiting from intermittent tactile cues and facilitation for increased elbow extension.  PERFORMANCE DEFICITS: in functional skills including ADLs, IADLs, coordination, dexterity, tone, ROM, strength, pain, flexibility, FMC, GMC, decreased knowledge of precautions, and UE functional use, cognitive skills including learn, memory, and safety awareness.    PLAN:  OT FREQUENCY: 1x/week  OT DURATION: 8 weeks  PLANNED INTERVENTIONS: self care/ADL training, therapeutic exercise, therapeutic activity, neuromuscular re-education, manual therapy, passive range of motion, aquatic therapy, splinting, moist heat, cryotherapy, and patient/family education   RECOMMENDED OTHER SERVICES: may benefit from aquatic therapy OT  CONSULTED AND AGREED WITH PLAN OF CARE: Patient and family member/caregiver  PLAN FOR NEXT SESSION: Review HEP and begin PROM and self-ROM for shoulder and elbow ROM.  Assess previous splint and plan to fabricate in future session PRN.    Rosalio Loud, OTR/L 04/13/2023, 10:49 AM   [facility] at [facility] [address] [place], [address] [place], [place] [address] Phone # [phone] Fax # [phone]

## 2023-04-20 ENCOUNTER — Ambulatory Visit: Payer: BC Managed Care – PPO | Attending: Physical Medicine & Rehabilitation | Admitting: Occupational Therapy

## 2023-04-20 DIAGNOSIS — M25641 Stiffness of right hand, not elsewhere classified: Secondary | ICD-10-CM

## 2023-04-20 DIAGNOSIS — R2681 Unsteadiness on feet: Secondary | ICD-10-CM

## 2023-04-20 DIAGNOSIS — M6281 Muscle weakness (generalized): Secondary | ICD-10-CM | POA: Diagnosis present

## 2023-04-20 DIAGNOSIS — M25621 Stiffness of right elbow, not elsewhere classified: Secondary | ICD-10-CM | POA: Diagnosis present

## 2023-04-20 DIAGNOSIS — M25611 Stiffness of right shoulder, not elsewhere classified: Secondary | ICD-10-CM | POA: Diagnosis present

## 2023-04-20 DIAGNOSIS — G8111 Spastic hemiplegia affecting right dominant side: Secondary | ICD-10-CM | POA: Diagnosis present

## 2023-04-20 NOTE — Therapy (Signed)
OUTPATIENT OCCUPATIONAL THERAPY NEURO TREATMENT  Patient Name: Paul Kane MRN: [id number] DOB:June 17, 1986, 37 y.o., male Today's Date: 04/20/2023  PCP: Bailey Mech, PA-C  REFERRING PROVIDER: Ranelle Oyster, MD  END OF SESSION:  OT End of Session - 04/20/23 1004     Visit Number 8    Number of Visits 9    Date for OT Re-Evaluation 04/30/23    Authorization Type BCBS / Medicaid-[place] Access 2024    Authorization - Visit Number 7    Authorization - Number of Visits 8    OT Start Time 0935    OT Stop Time 1015    OT Time Calculation (min) 40 min    Activity Tolerance Patient tolerated treatment well    Behavior During Therapy WFL for tasks assessed/performed                   Past Medical History:  Diagnosis Date   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HCC)    Chronic hepatitis B (HCC)    Fixed pupils    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HCC)    Hypertension    Intracranial injury of other and unspecified nature, without mention of open intracranial wound, unspecified state of consciousness    Intracranial shunt    Paralysis (HCC)    Spastic hemiplegia affecting dominant side (HCC)    Stroke (HCC)    Traumatic brain injury (HCC)    Trouble swallowing    Visual disturbance    Weakness    Past Surgical History:  Procedure Laterality Date   cramiectomy     CRANIOPLASTY     REMOVAL OF GASTROSTOMY TUBE     VENTRICULOPERITONEAL SHUNT     Patient Active Problem List   Diagnosis Date Noted   SIRS due to infectious process with acute organ dysfunction (HCC) 03/14/2021   SIRS (systemic inflammatory response syndrome) (HCC) 03/12/2021   Syncope 03/12/2021   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) 03/12/2021   Hypokalemia 03/12/2021   Lactic acidosis 03/12/2021   Mixed hyperlipidemia 04/25/2018   Seizure disorder (HCC) 04/11/2018   Mechanical low back pain 05/12/2017   Dysarthria 12/20/2015   CD (conductive deafness)  04/20/2012   Deafness, sensorineural 04/20/2012   Buzzing in ear 04/20/2012   Leaking percutaneous endoscopic gastrostomy (PEG) tube (HCC) 02/04/2012   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 pre-existing conscious levels, sequela (HCC) 10/12/2011   Cerebral infarct (HCC) 10/12/2011   Spastic hemiplegia of right dominant side due to noncerebrovascular etiology (HCC) 10/12/2011   Dysphagia 10/12/2011   Functioning G-tube 03/24/2011   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   Hemiplegia of dominant side (HCC) 07/28/2010   CEREBRAL HEMORRHAGE 07/28/2010   PERSONAL HISTORY OF TRAUMATIC BRAIN INJURY 07/02/2010   ALLERGIC RHINITIS CAUSE UNSPECIFIED 10/25/2008   GASTROENTERITIS 08/09/2008   CONTACT DERMATITIS&OTHER ECZEMA DUE DETERGENTS 08/18/2007   HEPATITIS B, CHRONIC 06/28/2007   Chronic viral hepatitis B without delta-agent (HCC) 06/28/2007    ONSET DATE: referral date 02/17/23  REFERRING DIAG: G81.11 (ICD-10-CM) - Spastic hemiplegia affecting right dominant side, unspecified etiology  THERAPY DIAG:  Spastic hemiplegia affecting right dominant side, unspecified etiology (HCC)  Muscle weakness (generalized)  Unsteadiness on feet  Stiffness of right shoulder, not elsewhere classified  Stiffness of right elbow, not elsewhere classified  Stiffness of right hand, not elsewhere classified  Rationale for Evaluation and Treatment: Rehabilitation  SUBJECTIVE:   SUBJECTIVE STATEMENT: Pt reports "I've been doing my exercises." Pt accompanied  by: self and family member (father)  PERTINENT HISTORY:  1. History of traumatic brain injury with hydrocephalus, meningitis,   and right pontine stroke (2011) 2. Spastic tetraplegia, dominant side more affected  PRECAUTIONS: Fall and Other: h/o seizures  WEIGHT BEARING RESTRICTIONS: No  PAIN:  Are you having pain? No  FALLS: Has patient fallen in last 6 months? Yes. Number of falls  mother reports that "he falls a lot, but not lately"  LIVING ENVIRONMENT: Lives with: lives with their family Lives in: House/apartment Stairs: Yes: Internal: full flight to 2nd floor, but bedroom and bathroom on main floor and does not need to go up steps; can reach both and External: 3 steps; can reach both Has following equipment at home: shower chair and Grab bars  PLOF: Needs assistance with ADLs  PATIENT GOALS: increased use of R arm  OBJECTIVE:   HAND DOMINANCE: Right  ADLs: Mobility: Mod I around the house, hand held assist when in the community Grooming: Mod I  UB Dressing: Mod I LB Dressing: family assists with donning AFO and tying shoes, will assist with setup for clothing on RLE and then pt able to pull up Toileting: Mod I - utilizes bidet for hygiene Bathing: family assists with bathing, he completes ~90% Tub Shower transfers: Supervision/assist with shower transfers Equipment: Shower seat with back, Grab bars, and Walk in shower  IADLs: assists with housekeeping tasks with emptying dishwasher and folding clothes  MOBILITY STATUS: Hx of falls  UPPER EXTREMITY ROM:  noted R scapular winging and decreased scapular elevation  Active ROM Right eval Right PROM Right 03/29/23 PROM 03/29/23  Shoulder flexion 84  98 112  Shoulder abduction      Shoulder adduction      Shoulder extension      Shoulder internal rotation      Shoulder external rotation      Elbow flexion 140  135   Elbow extension -42  -42 -35  Wrist flexion unable 48 unable 56  Wrist extension 22 35 18 50  Wrist ulnar deviation      Wrist radial deviation      Wrist pronation      Wrist supination      (Blank rows = not tested)   HAND FUNCTION: Pt is able to manually open hand with great effort, most active movement is in index finger  COORDINATION: Unable to assess due to decreased grasp/release  MUSCLE TONE: RUE: RUE: Moderate, Hypertonic, and Modifed Ashworth Scale 3 = Considerable  increase in muscle tone, passive movement difficult  COGNITION: Overall cognitive status: Impaired and History of cognitive impairments - at baseline, mother present providing majority of information    TODAY'S TREATMENT:                                                                          DATE:  04/20/23 WB through RUE: engaged in WB through R hand and extended elbow over half ball to facilitate increased tolerance of weight through wrist while facilitating increased elbow extension while reaching across midline with LUE for bean bag and then sitting upright to facilitate elbow extension while tossing bean bag to target just L of midline. Pt requiring physical assistance from OT to  maintain positioning of hand on half ball and cues at elbow for extension. Saebo glide: engaged in shoulder flexion and elbow extension with OT providing hand over hand to maintain grasp on glide portion to further facilitate ROM. Transitioned to engaging in shoulder horizontal abduction with saebo glide in horizontal plane.  OT providing verbal cues to maintain upright sitting posture to facilitate increased shoulder horizontal abduction and elbow extension.   Stretch: worked on reaching with gravity downward toward AFO/top of shoe. Patient able to touch to laces on shoe. OT providing min cues to sustain stretch for 10 seconds initially, however pt able to sustain stretch up to 30 seconds during final 2 before returning to upright sitting.    04/13/23 UBE: Engaged in 6 mins total on 1.5 resistance, alternating 2 mins forward, 2 mins backward, 2 mins forward.  OT utilizing coban and hand over hand to maintain grasp on handle, especially when "pedaling" backwards.  Pt demonstrating improved pace and elbow extension when "pedaling" forwards.   Elbow extension: engaged in [facility] with towel slides with hand over hand reaching towards target to further facilitate increased R elbow extension.  OT then incorporating colored  targets to facilitate increased precise movements when reaching towards specific target.   PROM: OT providing passive ROM and stretch to wrist and digits.  Pt demonstrating tone in R wrist impacting ease of wrist flexion, however demonstrating decreased tone and resistance with extension as well as finger extension. AAROM: NMR exercises in supine for increased scapular support and gravity-minimized/eliminated positioning.  Engaged in shoulder flexion and chest press in supine position with dowel in BUE to facilitate increased elbow extension and shoulder flexion.  Therapist providing intermittent tactile cues to R elbow to further facilitate movement.  Incorporated horizontal abduction/adduction into ROM with OT providing intermittent cues for extension and abduction.   04/06/23 Palpated elbow and surrounding area s/p fall last week, elbow boggy and with abrasion but no c/o pain to palpation or with movements.  Discussed elbow protector (heelbo sleeve) and provided handout with options to order for protection due to high fall risk and frequently falling onto R elbow when falling. Elbow extension: engaged in [place] with use of cane to facilitate increased elbow extension.  OT providing hand over hand assist to facilitate sustained hold at end range.  Pt able to tolerate extension to -30* this session.  OT providing target for pt to reach towards to facilitate increased ROM. Functional reach: transitioned to more functional reach with pt able to grasp onto cone with assist from other hand to further open hand.  Pt demonstrating good active shoulder flexion and elbow extension to place onto target.  OT providing min tactile cues for trunk control to further facilitate UE ROM.  OT providing mod facilitation for elbow extension during reaching into various planes. Stretch: worked on reaching with gravity downward toward AFO/top of shoe. Patient able to touch to laces on shoe.OT providing min cues to sustain  stretch for ~10 seconds before returning to upright sitting.   PATIENT EDUCATION: Education details: ongoing condition specific education Person educated: Patient and Parent Education method: Explanation, Demonstration, Actor cues, and Handouts Education comprehension: needs further education  HOME EXERCISE PROGRAM: SRA lab Self-ROM shoulder and scapula exercises  Access Code: [email or web address] URL: [email or web address] Date: 03/15/2023 Prepared by: [facility] - [facility]    GOALS: Goals reviewed with patient? Yes  SHORT TERM GOALS: Target date: 04/02/23  Pt and caregiver will be independent with HEP for  shoulder and elbow ROM. Baseline: Pt not currently engaging in any ROM/stretching program Goal status: ONGOING   Pt will demonstrate improved active elbow extension to -35* to increase functional use Baseline: -42* Goal status: Not met - -42* on 03/29/23 with difficulty achieving any additional extension even with PROM this session   Pt will demonstrate improved shoulder flexion to 90* to increase functional use Baseline: 84* Goal status: MET - 96* on 03/29/23   LONG TERM GOALS: Target date: 04/30/23  Pt will tolerate advanced HEP/stretching program for RUE. Baseline: No HEP at this time Goal status: ONGOING   Pt will demonstrate improved active elbow extension to -25* to increase functional use Baseline: -42* Goal status: ONGOING  Pt will demonstrate improved shoulder flexion to 110* to allow for improved ease with ADLs. Baseline: 84* Goal status: ONGOING   4.   Pt will be independent with splint wear and care PRN Baseline: splinting needs TBD Goal status: ONGOING   ASSESSMENT:  CLINICAL IMPRESSION: Pt tolerating increased focus on elbow flexion/extension with Saebo glide and WB through R hand to further facilitate extension.  Pt continues to demonstrate tightness in elbow limiting functional reach.  Pt able to maintain loose  gross grasp on Saebo glide this session in horizontal plane, requiring hand over hand during vertical ROM.  PERFORMANCE DEFICITS: in functional skills including ADLs, IADLs, coordination, dexterity, tone, ROM, strength, pain, flexibility, FMC, GMC, decreased knowledge of precautions, and UE functional use, cognitive skills including learn, memory, and safety awareness.    PLAN:  OT FREQUENCY: 1x/week  OT DURATION: 8 weeks  PLANNED INTERVENTIONS: self care/ADL training, therapeutic exercise, therapeutic activity, neuromuscular re-education, manual therapy, passive range of motion, aquatic therapy, splinting, moist heat, cryotherapy, and patient/family education   RECOMMENDED OTHER SERVICES: may benefit from aquatic therapy OT  CONSULTED AND AGREED WITH PLAN OF CARE: Patient and family member/caregiver  PLAN FOR NEXT SESSION: Review HEP and begin PROM and self-ROM for shoulder and elbow ROM.  D/c at next visit    Rosalio Loud, OTR/L 04/20/2023, 10:04 AM   [facility] at [facility] [address] [place], [address] [place], [place] [address] Phone # [phone] Fax # [phone]

## 2023-04-27 ENCOUNTER — Ambulatory Visit: Payer: BC Managed Care – PPO | Admitting: Occupational Therapy

## 2023-04-27 DIAGNOSIS — M25641 Stiffness of right hand, not elsewhere classified: Secondary | ICD-10-CM

## 2023-04-27 DIAGNOSIS — M25621 Stiffness of right elbow, not elsewhere classified: Secondary | ICD-10-CM

## 2023-04-27 DIAGNOSIS — M25611 Stiffness of right shoulder, not elsewhere classified: Secondary | ICD-10-CM

## 2023-04-27 DIAGNOSIS — G8111 Spastic hemiplegia affecting right dominant side: Secondary | ICD-10-CM | POA: Diagnosis not present

## 2023-04-27 DIAGNOSIS — M6281 Muscle weakness (generalized): Secondary | ICD-10-CM

## 2023-04-27 NOTE — Therapy (Signed)
OUTPATIENT OCCUPATIONAL THERAPY NEURO  TREATMENT & Discharge  Patient Name: Paul Kane MRN: [id number] DOB:12-13-1985, 37 y.o., male Today's Date: 04/27/2023  PCP: Bailey Mech, PA-C  REFERRING PROVIDER: Ranelle Oyster, MD  OCCUPATIONAL THERAPY DISCHARGE SUMMARY  Visits from Start of Care: 9  Current functional level related to goals / functional outcomes: Pt demonstrating progress towards ROM goals, surpassing STGs but not quite meeting LTGs.  Pt and parents both report understanding of exercises and engagement in HEP at home.   Remaining deficits: Tone and decreased ROM in RUE    Education / Equipment: Educated on spasticity, HEP for ROM/stretch, bracing and elbow support due to frequency of falls   Patient agrees to discharge. Patient goals were not met. Patient is being discharged due to maximized rehab potential. .     END OF SESSION:  OT End of Session - 04/27/23 1330     Visit Number 9    Number of Visits 9    Date for OT Re-Evaluation 04/30/23    Authorization Type BCBS / Medicaid-[place] Access 2024    Authorization - Visit Number 8    Authorization - Number of Visits 8    OT Start Time 1235    OT Stop Time 1315    OT Time Calculation (min) 40 min    Activity Tolerance Patient tolerated treatment well    Behavior During Therapy WFL for tasks assessed/performed                    Past Medical History:  Diagnosis Date   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HCC)    Chronic hepatitis B (HCC)    Fixed pupils    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HCC)    Hypertension    Intracranial injury of other and unspecified nature, without mention of open intracranial wound, unspecified state of consciousness    Intracranial shunt    Paralysis (HCC)    Spastic hemiplegia affecting dominant side (HCC)    Stroke (HCC)    Traumatic brain injury (HCC)    Trouble swallowing    Visual disturbance    Weakness    Past Surgical History:   Procedure Laterality Date   cramiectomy     CRANIOPLASTY     REMOVAL OF GASTROSTOMY TUBE     VENTRICULOPERITONEAL SHUNT     Patient Active Problem List   Diagnosis Date Noted   SIRS due to infectious process with acute organ dysfunction (HCC) 03/14/2021   SIRS (systemic inflammatory response syndrome) (HCC) 03/12/2021   Syncope 03/12/2021   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) 03/12/2021   Hypokalemia 03/12/2021   Lactic acidosis 03/12/2021   Mixed hyperlipidemia 04/25/2018   Seizure disorder (HCC) 04/11/2018   Mechanical low back pain 05/12/2017   Dysarthria 12/20/2015   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/20/2012   Deafness, sensorineural 04/20/2012   Buzzing in ear 04/20/2012   Leaking percutaneous endoscopic gastrostomy (PEG) tube (HCC) 02/04/2012   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 pre-existing conscious levels, sequela (HCC) 10/12/2011   Cerebral infarct (HCC) 10/12/2011   Spastic hemiplegia of right dominant side due to noncerebrovascular etiology (HCC) 10/12/2011   Dysphagia 10/12/2011   Functioning G-tube 03/24/2011   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   Hemiplegia of dominant side (HCC) 07/28/2010   CEREBRAL HEMORRHAGE 07/28/2010   PERSONAL HISTORY OF TRAUMATIC BRAIN INJURY 07/02/2010   Allergic rhinitis 10/25/2008   GASTROENTERITIS 08/09/2008   CONTACT DERMATITIS&OTHER ECZEMA DUE  DETERGENTS 08/18/2007   Chronic hepatitis B virus infection (HCC) 06/28/2007   Chronic viral hepatitis B without delta-agent (HCC) 06/28/2007    ONSET DATE: referral date 02/17/23  REFERRING DIAG: G81.11 (ICD-10-CM) - Spastic hemiplegia affecting right dominant side, unspecified etiology  THERAPY DIAG:  Spastic hemiplegia affecting right dominant side, unspecified etiology (HCC)  Muscle weakness (generalized)  Stiffness of right shoulder, not elsewhere classified  Stiffness of right elbow, not elsewhere  classified  Stiffness of right hand, not elsewhere classified  Rationale for Evaluation and Treatment: Rehabilitation  SUBJECTIVE:   SUBJECTIVE STATEMENT: Pt's mother reports he is getting next botox injection end of this month. Pt accompanied by: self and family member (mother)  PERTINENT HISTORY:  1. History of traumatic brain injury with hydrocephalus, meningitis,   and right pontine stroke (2011) 2. Spastic tetraplegia, dominant side more affected  PRECAUTIONS: Fall and Other: h/o seizures  WEIGHT BEARING RESTRICTIONS: No  PAIN:  Are you having pain? No  FALLS: Has patient fallen in last 6 months? Yes. Number of falls mother reports that "he falls a lot, but not lately"  LIVING ENVIRONMENT: Lives with: lives with their family Lives in: House/apartment Stairs: Yes: Internal: full flight to 2nd floor, but bedroom and bathroom on main floor and does not need to go up steps; can reach both and External: 3 steps; can reach both Has following equipment at home: shower chair and Grab bars  PLOF: Needs assistance with ADLs  PATIENT GOALS: increased use of R arm  OBJECTIVE:   HAND DOMINANCE: Right  ADLs: Mobility: Mod I around the house, hand held assist when in the community Grooming: Mod I  UB Dressing: Mod I LB Dressing: family assists with donning AFO and tying shoes, will assist with setup for clothing on RLE and then pt able to pull up Toileting: Mod I - utilizes bidet for hygiene Bathing: family assists with bathing, he completes ~90% Tub Shower transfers: Supervision/assist with shower transfers Equipment: Shower seat with back, Grab bars, and Walk in shower  IADLs: assists with housekeeping tasks with emptying dishwasher and folding clothes  MOBILITY STATUS: Hx of falls  UPPER EXTREMITY ROM:  noted R scapular winging and decreased scapular elevation  Active ROM Right eval Right PROM Right 03/29/23 PROM 03/29/23 Right 04/27/23 PROM 04/27/23  Shoulder flexion  84  98 112 100 116  Shoulder abduction        Shoulder adduction        Shoulder extension        Shoulder internal rotation        Shoulder external rotation        Elbow flexion 140  135     Elbow extension -42  -42 -35 -32 -29  Wrist flexion unable 48 unable 56    Wrist extension 22 35 18 50    Wrist ulnar deviation        Wrist radial deviation        Wrist pronation        Wrist supination        (Blank rows = not tested)   HAND FUNCTION: Pt is able to manually open hand with great effort, most active movement is in index finger  COORDINATION: Unable to assess due to decreased grasp/release  MUSCLE TONE: RUE: RUE: Moderate, Hypertonic, and Modifed Ashworth Scale 3 = Considerable increase in muscle tone, passive movement difficult  COGNITION: Overall cognitive status: Impaired and History of cognitive impairments - at baseline, mother present providing majority of  information    TODAY'S TREATMENT:                                                                          DATE:  04/27/23 AAROM: engaged in shoulder flexion, shoulder abduction, external rotation, and elbow extension with cane for shoulder and elbow ROM.  OT providing tactile cues at RUE for positioning and providing target/boundaries to provide feedback for increased understanding and carryover of each movement.  Engaged in towel slides with focus on shoulder flexion, incorporating shoulder abduction, OT providing target to facilitate increased ROM.    04/20/23 WB through RUE: engaged in WB through R hand and extended elbow over half ball to facilitate increased tolerance of weight through wrist while facilitating increased elbow extension while reaching across midline with LUE for bean bag and then sitting upright to facilitate elbow extension while tossing bean bag to target just L of midline. Pt requiring physical assistance from OT to maintain positioning of hand on half ball and cues at elbow for  extension. Saebo glide: engaged in shoulder flexion and elbow extension with OT providing hand over hand to maintain grasp on glide portion to further facilitate ROM. Transitioned to engaging in shoulder horizontal abduction with saebo glide in horizontal plane.  OT providing verbal cues to maintain upright sitting posture to facilitate increased shoulder horizontal abduction and elbow extension.   Stretch: worked on reaching with gravity downward toward AFO/top of shoe. Patient able to touch to laces on shoe. OT providing min cues to sustain stretch for 10 seconds initially, however pt able to sustain stretch up to 30 seconds during final 2 before returning to upright sitting.    04/13/23 UBE: Engaged in 6 mins total on 1.5 resistance, alternating 2 mins forward, 2 mins backward, 2 mins forward.  OT utilizing coban and hand over hand to maintain grasp on handle, especially when "pedaling" backwards.  Pt demonstrating improved pace and elbow extension when "pedaling" forwards.   Elbow extension: engaged in [facility] with towel slides with hand over hand reaching towards target to further facilitate increased R elbow extension.  OT then incorporating colored targets to facilitate increased precise movements when reaching towards specific target.   PROM: OT providing passive ROM and stretch to wrist and digits.  Pt demonstrating tone in R wrist impacting ease of wrist flexion, however demonstrating decreased tone and resistance with extension as well as finger extension. AAROM: NMR exercises in supine for increased scapular support and gravity-minimized/eliminated positioning.  Engaged in shoulder flexion and chest press in supine position with dowel in BUE to facilitate increased elbow extension and shoulder flexion.  Therapist providing intermittent tactile cues to R elbow to further facilitate movement.  Incorporated horizontal abduction/adduction into ROM with OT providing intermittent cues for extension and  abduction.   PATIENT EDUCATION: Education details: ongoing condition specific education Person educated: Patient and Parent Education method: Explanation, Demonstration, Actor cues, and Handouts Education comprehension: needs further education  HOME EXERCISE PROGRAM: SRA lab Self-ROM shoulder and scapula exercises  Access Code: [email or web address] URL: [email or web address] Date: 03/15/2023 Prepared by: [facility] - Outpatient  Rehab - [facility]    GOALS: Goals reviewed with patient? Yes  SHORT TERM GOALS: Target date: 04/02/23  Pt  and caregiver will be independent with HEP for shoulder and elbow ROM. Baseline: Pt not currently engaging in any ROM/stretching program Goal status: ONGOING   Pt will demonstrate improved active elbow extension to -35* to increase functional use Baseline: -42* Goal status: Not met - -42* on 03/29/23 with difficulty achieving any additional extension even with PROM this session   Pt will demonstrate improved shoulder flexion to 90* to increase functional use Baseline: 84* Goal status: MET - 96* on 03/29/23   LONG TERM GOALS: Target date: 04/30/23  Pt will tolerate advanced HEP/stretching program for RUE. Baseline: No HEP at this time Goal status: MET - 04/27/23   Pt will demonstrate improved active elbow extension to -25* to increase functional use Baseline: -42* Goal status: NOT MET - -32 * on 04/27/23  Pt will demonstrate improved shoulder flexion to 110* to allow for improved ease with ADLs. Baseline: 84* Goal status: NOT MET - 100* on 04/27/23   4.   Pt will be independent with splint wear and care PRN Baseline: splinting needs TBD Goal status: NOT MET 04/27/23   ASSESSMENT:  CLINICAL IMPRESSION: Pt demonstrating improvements in shoulder flexion and elbow extension.  Pt demonstrating improvements in sustained grasp of R hand during exercises, maintaining grasp on cane in all movements.  Pt's mother observing exercises and  able to provide cues to ensure proper positioning and technique during HEP completion.    PERFORMANCE DEFICITS: in functional skills including ADLs, IADLs, coordination, dexterity, tone, ROM, strength, pain, flexibility, FMC, GMC, decreased knowledge of precautions, and UE functional use, cognitive skills including learn, memory, and safety awareness.    PLAN:  OT FREQUENCY: 1x/week  OT DURATION: 8 weeks  PLANNED INTERVENTIONS: self care/ADL training, therapeutic exercise, therapeutic activity, neuromuscular re-education, manual therapy, passive range of motion, aquatic therapy, splinting, moist heat, cryotherapy, and patient/family education   RECOMMENDED OTHER SERVICES: may benefit from aquatic therapy OT  CONSULTED AND AGREED WITH PLAN OF CARE: Patient and family member/caregiver  PLAN FOR NEXT SESSION: D/C    Rosalio Loud, OTR/L 04/27/2023, 1:32 PM   [facility] at [facility] [address], [address] [place], [place] [address] Phone # [phone] Fax # [phone]

## 2023-05-12 ENCOUNTER — Encounter
Payer: BC Managed Care – PPO | Attending: Physical Medicine & Rehabilitation | Admitting: Physical Medicine & Rehabilitation

## 2023-05-12 ENCOUNTER — Encounter: Payer: Self-pay | Admitting: Physical Medicine & Rehabilitation

## 2023-05-12 VITALS — BP 98/61 | HR 76 | Ht 67.0 in | Wt 172.0 lb

## 2023-05-12 DIAGNOSIS — G8111 Spastic hemiplegia affecting right dominant side: Secondary | ICD-10-CM | POA: Insufficient documentation

## 2023-05-12 MED ORDER — INCOBOTULINUMTOXINA 100 UNITS IM SOLR
500.0000 [IU] | Freq: Once | INTRAMUSCULAR | Status: AC
Start: 2023-05-12 — End: 2023-05-12
  Administered 2023-05-12: 500 [IU] via INTRAMUSCULAR

## 2023-05-12 NOTE — Progress Notes (Signed)
Xeomin Injection for spasticity using needle EMG guidance Indication: Spastic hemiplegia of right dominant side due to noncerebrovascular etiology (HCC) - Plan: incobotulinumtoxinA (XEOMIN) 100 units injection 500 Units G81.11  Dilution: 100 Units/ml        Total Units Injected: 500 Indication: Severe spasticity which interferes with ADL,mobility and/or  hygiene and is unresponsive to medication management and other conservative care Informed consent was obtained after describing risks and benefits of the procedure with the patient. This includes bleeding, bruising, infection, excessive weakness, or medication side effects. A REMS form is on file and signed.  Right side Needle: 50mm injectable monopolar needle electrode  Number of units per muscle Pectoralis Major 0 units Pectoralis Minor 0 units Biceps 0 units Brachioradialis 50 units FCR 25 units FCU 25 units FDS 25 units FDP 25 units FPL 0 units Pronator Teres 150 units Pronator Quadratus 0 units Lumbricals 0 units Quadriceps 0 units Gastroc/soleus 125 units Hamstrings 0 units Tibialis Posterior 75 units Tibialis Anterior 0 units EHL 0 units All injections were done after obtaining appropriate EMG activity and after negative drawback for blood. The patient tolerated the procedure well. Post procedure instructions were given. Return in about 3 months (around 08/12/2023) for spasticity reassessment .

## 2023-05-12 NOTE — Patient Instructions (Signed)
ALWAYS FEEL FREE TO CALL OUR OFFICE WITH ANY PROBLEMS OR QUESTIONS ([phone])  **PLEASE NOTE** ALL MEDICATION REFILL REQUESTS (INCLUDING CONTROLLED SUBSTANCES) NEED TO BE MADE AT LEAST 7 DAYS PRIOR TO REFILL BEING DUE. ANY REFILL REQUESTS INSIDE THAT TIME FRAME MAY RESULT IN DELAYS IN RECEIVING YOUR PRESCRIPTION.                    

## 2023-05-27 ENCOUNTER — Other Ambulatory Visit: Payer: Self-pay

## 2023-05-27 ENCOUNTER — Ambulatory Visit: Payer: BC Managed Care – PPO

## 2023-05-27 ENCOUNTER — Ambulatory Visit: Payer: BC Managed Care – PPO | Attending: Physical Medicine & Rehabilitation | Admitting: Occupational Therapy

## 2023-05-27 DIAGNOSIS — M6281 Muscle weakness (generalized): Secondary | ICD-10-CM | POA: Insufficient documentation

## 2023-05-27 DIAGNOSIS — M25641 Stiffness of right hand, not elsewhere classified: Secondary | ICD-10-CM | POA: Insufficient documentation

## 2023-05-27 DIAGNOSIS — R2681 Unsteadiness on feet: Secondary | ICD-10-CM

## 2023-05-27 DIAGNOSIS — R293 Abnormal posture: Secondary | ICD-10-CM | POA: Insufficient documentation

## 2023-05-27 DIAGNOSIS — M25611 Stiffness of right shoulder, not elsewhere classified: Secondary | ICD-10-CM | POA: Insufficient documentation

## 2023-05-27 DIAGNOSIS — R262 Difficulty in walking, not elsewhere classified: Secondary | ICD-10-CM | POA: Diagnosis present

## 2023-05-27 DIAGNOSIS — M25621 Stiffness of right elbow, not elsewhere classified: Secondary | ICD-10-CM | POA: Insufficient documentation

## 2023-05-27 DIAGNOSIS — G8111 Spastic hemiplegia affecting right dominant side: Secondary | ICD-10-CM | POA: Diagnosis present

## 2023-05-27 DIAGNOSIS — R2689 Other abnormalities of gait and mobility: Secondary | ICD-10-CM

## 2023-05-27 NOTE — Therapy (Signed)
OUTPATIENT OCCUPATIONAL THERAPY NEURO EVALUATION  Patient Name: Paul Kane MRN: [id number] DOB:April 16, 1986, 37 y.o., male Today's Date: 05/27/2023  PCP: Bailey Mech, PA-C REFERRING PROVIDER: Ranelle Oyster, MD  END OF SESSION:  OT End of Session - 05/27/23 0930     Visit Number 1    Number of Visits 1    Date for OT Re-Evaluation 05/27/23    Authorization Type BCBS / Medicaid-[place] Access 2024    OT Start Time 0851    OT Stop Time 0917    OT Time Calculation (min) 26 min             Past Medical History:  Diagnosis Date   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HCC)    Chronic hepatitis B (HCC)    Fixed pupils    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HCC)    Hypertension    Intracranial injury of other and unspecified nature, without mention of open intracranial wound, unspecified state of consciousness    Intracranial shunt    Paralysis (HCC)    Spastic hemiplegia affecting dominant side (HCC)    Stroke (HCC)    Traumatic brain injury (HCC)    Trouble swallowing    Visual disturbance    Weakness    Past Surgical History:  Procedure Laterality Date   cramiectomy     CRANIOPLASTY     REMOVAL OF GASTROSTOMY TUBE     VENTRICULOPERITONEAL SHUNT     Patient Active Problem List   Diagnosis Date Noted   SIRS due to infectious process with acute organ dysfunction (HCC) 03/14/2021   SIRS (systemic inflammatory response syndrome) (HCC) 03/12/2021   Syncope 03/12/2021   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) 03/12/2021   Hypokalemia 03/12/2021   Lactic acidosis 03/12/2021   Mixed hyperlipidemia 04/25/2018   Seizure disorder (HCC) 04/11/2018   Mechanical low back pain 05/12/2017   Dysarthria 12/20/2015   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/20/2012   Deafness, sensorineural 04/20/2012   Buzzing in ear 04/20/2012   Leaking percutaneous endoscopic gastrostomy (PEG) tube (HCC) 02/04/2012   Diffuse traumatic brain injury with loss of  consciousness greater than 24 hours with return to pre-existing conscious levels, sequela (HCC) 10/12/2011   Cerebral infarct (HCC) 10/12/2011   Spastic hemiplegia of right dominant side due to noncerebrovascular etiology (HCC) 10/12/2011   Dysphagia 10/12/2011   Functioning G-tube 03/24/2011   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   Hemiplegia of dominant side (HCC) 07/28/2010   CEREBRAL HEMORRHAGE 07/28/2010   PERSONAL HISTORY OF TRAUMATIC BRAIN INJURY 07/02/2010   Allergic rhinitis 10/25/2008   GASTROENTERITIS 08/09/2008   CONTACT DERMATITIS&OTHER ECZEMA DUE DETERGENTS 08/18/2007   Chronic hepatitis B virus infection (HCC) 06/28/2007   Chronic viral hepatitis B without delta-agent (HCC) 06/28/2007    ONSET DATE: referral date 05/12/23  REFERRING DIAG: G81.11 (ICD-10-CM) - Spastic hemiplegia of right dominant side due to noncerebrovascular etiology  THERAPY DIAG:  Spastic hemiplegia affecting right dominant side, unspecified etiology (HCC)  Muscle weakness (generalized)  Stiffness of right shoulder, not elsewhere classified  Stiffness of right elbow, not elsewhere classified  Stiffness of right hand, not elsewhere classified  Rationale for Evaluation and Treatment: Rehabilitation  SUBJECTIVE:   SUBJECTIVE STATEMENT: Pt's father reports that pt had Botox a couple weeks ago. Pt accompanied by: self and family member (father)  PERTINENT HISTORY:  1. History of traumatic brain injury with hydrocephalus, meningitis,  and right pontine stroke (2011) 2. Spastic tetraplegia, dominant side more affected  PRECAUTIONS: Fall and Other: h/o seizures  WEIGHT BEARING RESTRICTIONS: No  PAIN:  Are you having pain? No  FALLS: Has patient fallen in last 6 months? Yes. Number of falls 1 fall recently ~about 2 week ago, 2-3 falls over the last 6 months  LIVING ENVIRONMENT: Lives with: lives with their family Lives in: House/apartment Stairs: Yes: Internal: full flight to 2nd floor,  but bedroom and bathroom on main floor and does not need to go up steps; can reach both and External: 3 steps; can reach both Has following equipment at home: shower chair and Grab bars  PLOF: Needs assistance with ADLs  PATIENT GOALS: increased use of R arm  OBJECTIVE:  Note: Objective measures were completed at Evaluation unless otherwise noted.  HAND DOMINANCE: Right  ADLs: Overall ADLs: information taken from EVAL doc 03/01/23 as pt and father report no changes Mobility: Mod I around the house, hand held assist when in the community Grooming: Mod I  UB Dressing: Mod I LB Dressing: family assists with donning AFO and tying shoes, will assist with setup for clothing on RLE and then pt able to pull up Toileting: Mod I - utilizes bidet for hygiene Bathing: family assists with bathing, he completes ~90% Tub Shower transfers: Supervision/assist with shower transfers Equipment: Shower seat with back, Grab bars, and Walk in shower  IADLs: assists with housekeeping tasks with emptying dishwasher and folding clothes   MOBILITY STATUS: Hx of falls  UPPER EXTREMITY ROM:  R scapular winging and decreased scapular elevation  Active ROM Right eval Left eval PROM Right eval  Shoulder flexion 102  113  Shoulder abduction 88  94  Shoulder adduction     Shoulder extension     Shoulder internal rotation     Shoulder external rotation     Elbow flexion 132  147  Elbow extension -42  -38  Wrist flexion -17  55  Wrist extension 29  68  Wrist ulnar deviation     Wrist radial deviation     Wrist pronation     Wrist supination     (Blank rows = not tested)  HAND FUNCTION: Pt is able to manually open hand with great effort, most active movement is in index finger   COORDINATION: Unable to assess due to decreased grasp/release   MUSCLE TONE: RUE: RUE: Moderate, Hypertonic, and Modifed Ashworth Scale 3 = Considerable increase in muscle tone, passive movement  difficult  COGNITION: Overall cognitive status: History of cognitive impairments - at baseline; father present providing majority of information                                                                                                 PATIENT EDUCATION: Education details: assessment details, insurance visit imitation, [employer] recommendations Person educated: Patient and Parent Education method: Explanation Education comprehension: verbalized understanding  HOME EXERCISE PROGRAM: N/A at this time, recommend continued from previous sessions   GOALS: Goals reviewed with patient? No, N/A at this time   ASSESSMENT:  CLINICAL IMPRESSION: Patient is a 37 y.o. male who was seen today for occupational therapy evaluation for Eval and Treat s/p botox right  elbow and forearm.  Assessment reveals performance consistent with PLOF and ROM as pt with similar AROM, however with increased tolerance to PROM.  Pt does not demonstrate any significant change in status or function and has exhausted visit limit for this calendar year.  Therefore OT reprinted HEP and strongly encouraged that pt continue with HEP.  Patient's father very involved in pt's care and they have been attending local fitness facility [facility]) for gym-based exercise, therefore will provide referral to [employer] to collaborate with personal trainers to assist in exposure and further development of routine to facilitate additional ROM and use of strength training equipment to maintain current level of function.  PERFORMANCE DEFICITS: in functional skills including ADLs, IADLs, coordination, dexterity, tone, ROM, strength, pain, flexibility, FMC, GMC, decreased knowledge of precautions, and UE functional use, cognitive skills including learn, memory, and safety awareness.   IMPAIRMENTS: are limiting patient from ADLs and IADLs.   CO-MORBIDITIES: may have co-morbidities  that affects occupational performance. Patient will benefit from  skilled OT to address above impairments and improve overall function.  MODIFICATION OR ASSISTANCE TO COMPLETE EVALUATION: Min-Moderate modification of tasks or assist with assess necessary to complete an evaluation.  OT OCCUPATIONAL PROFILE AND HISTORY: Problem focused assessment: Including review of records relating to presenting problem.  CLINICAL DECISION MAKING: LOW - limited treatment options, no task modification necessary  REHAB POTENTIAL: N/A at this time  EVALUATION COMPLEXITY: Low    PLAN:  OT FREQUENCY: one time visit  OT DURATION: other: one time visit  PLANNED INTERVENTIONS: 97535 self care/ADL training and 10272 therapeutic exercise  CONSULTED AND AGREED WITH PLAN OF CARE: Patient and family member/caregiver  PLAN FOR NEXT SESSION: referral to [employer] personal training dept.     Rosalio Loud, OTR/L 05/27/2023, 9:31 AM   [facility] at [facility] [address] [place], [address] [place], [place] [address] Phone # [phone] Fax # [phone]

## 2023-05-27 NOTE — Therapy (Signed)
OUTPATIENT PHYSICAL THERAPY NEURO EVALUATION   Patient Name: Paul Kane MRN: [id number] DOB:Jun 24, 1986, 37 y.o., male Today's Date: 05/27/2023   PCP: Bailey Mech, PA-C REFERRING PROVIDER: Ranelle Oyster, MD  END OF SESSION:  PT End of Session - 05/27/23 0911     Visit Number 1    Authorization Type BCBS/Medicaid    PT Start Time 0930    PT Stop Time 1015    PT Time Calculation (min) 45 min             Past Medical History:  Diagnosis Date   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HCC)    Chronic hepatitis B (HCC)    Fixed pupils    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HCC)    Hypertension    Intracranial injury of other and unspecified nature, without mention of open intracranial wound, unspecified state of consciousness    Intracranial shunt    Paralysis (HCC)    Spastic hemiplegia affecting dominant side (HCC)    Stroke (HCC)    Traumatic brain injury (HCC)    Trouble swallowing    Visual disturbance    Weakness    Past Surgical History:  Procedure Laterality Date   cramiectomy     CRANIOPLASTY     REMOVAL OF GASTROSTOMY TUBE     VENTRICULOPERITONEAL SHUNT     Patient Active Problem List   Diagnosis Date Noted   SIRS due to infectious process with acute organ dysfunction (HCC) 03/14/2021   SIRS (systemic inflammatory response syndrome) (HCC) 03/12/2021   Syncope 03/12/2021   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) 03/12/2021   Hypokalemia 03/12/2021   Lactic acidosis 03/12/2021   Mixed hyperlipidemia 04/25/2018   Seizure disorder (HCC) 04/11/2018   Mechanical low back pain 05/12/2017   Dysarthria 12/20/2015   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/20/2012   Deafness, sensorineural 04/20/2012   Buzzing in ear 04/20/2012   Leaking percutaneous endoscopic gastrostomy (PEG) tube (HCC) 02/04/2012   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 pre-existing conscious levels, sequela (HCC)  10/12/2011   Cerebral infarct (HCC) 10/12/2011   Spastic hemiplegia of right dominant side due to noncerebrovascular etiology (HCC) 10/12/2011   Dysphagia 10/12/2011   Functioning G-tube 03/24/2011   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   Hemiplegia of dominant side (HCC) 07/28/2010   CEREBRAL HEMORRHAGE 07/28/2010   PERSONAL HISTORY OF TRAUMATIC BRAIN INJURY 07/02/2010   Allergic rhinitis 10/25/2008   GASTROENTERITIS 08/09/2008   CONTACT DERMATITIS&OTHER ECZEMA DUE DETERGENTS 08/18/2007   Chronic hepatitis B virus infection (HCC) 06/28/2007   Chronic viral hepatitis B without delta-agent (HCC) 06/28/2007    ONSET DATE: 07/02/2010  REFERRING DIAG: G81.11 (ICD-10-CM) - Spastic hemiplegia of right dominant side due to noncerebrovascular etiology  THERAPY DIAG:  Muscle weakness (generalized)  Unsteadiness on feet  Other abnormalities of gait and mobility  Difficulty in walking, not elsewhere classified  Abnormal posture  Rationale for Evaluation and Treatment: Rehabilitation  SUBJECTIVE:  SUBJECTIVE STATEMENT: No new issues, had a fall when walking in house with socks and slipped on hardwood.  Recently received Botox to right lower leg for spasticity Pt accompanied by: family member  PERTINENT HISTORY: . History of traumatic brain injury with hydrocephalus, meningitis,   and right pontine stroke.   2. Spastic tetraplegia, dominant side more affected 3. severe oropharyngeal dysphagia--improved--tolerating diet well.  Continues on thickened liquids 4. Absence seizures 5. Mechanical Low back pain: due to posture/altered gait patterns--improved 6. Left great toe infection--podiatry f/u  PAIN:  Are you having pain? No  PRECAUTIONS: Fall  RED FLAGS: None   WEIGHT BEARING RESTRICTIONS:  No  FALLS: Has patient fallen in last 6 months? Yes. Number of falls 1  LIVING ENVIRONMENT: Lives with: lives with their family Lives in: House/apartment Stairs: Yes, has bedroom downstairs Has following equipment at home:  handhold assist and None  PLOF: Requires assistive device for independence, Needs assistance with ADLs, and Needs assistance with gait  PATIENT GOALS: evaluation for change  OBJECTIVE:  Note: Objective measures were completed at Evaluation unless otherwise noted.  DIAGNOSTIC FINDINGS: N/a for episode, recent Botox injections right lower leg  COGNITION: Overall cognitive status: History of cognitive impairments - at baseline   SENSATION: Impaired R>L  COORDINATION: Grossly impaired R>L  EDEMA:  none  MUSCLE TONE: no clonus  MUSCLE LENGTH: Able to achieve full extension in long sitting  DTRs:  NT  POSTURE: forward head  LOWER EXTREMITY ROM:     Active  Right Eval Left Eval  Hip flexion    Hip extension    Hip abduction    Hip adduction    Hip internal rotation    Hip external rotation    Knee flexion 120 120  Knee extension 0 0  Ankle dorsiflexion 5 15  Ankle plantarflexion    Ankle inversion    Ankle eversion     (Blank rows = not tested)  LOWER EXTREMITY MMT:    MMT Right Eval Left Eval  Hip flexion 4- 5  Hip extension    Hip abduction    Hip adduction    Hip internal rotation    Hip external rotation    Knee flexion 4 5  Knee extension 4+ 5  Ankle dorsiflexion 4 5  Ankle plantarflexion    Ankle inversion 4 5  Ankle eversion 5 5  (Blank rows = not tested)  BED MOBILITY:  Independent  TRANSFERS: Assistive device utilized: None  Sit to stand: Complete Independence and Modified independence Stand to sit: Complete Independence and Modified independence Chair to chair: Modified independence Floor:  NT    CURB:  Level of Assistance: SBA and CGA Assistive device utilized: None Curb Comments:     GAIT: Gait  pattern: circumduction- Right and poor foot clearance- Right Distance walked:  Assistive device utilized:  CGA-HHA and None Level of assistance: SBA Comments:   FUNCTIONAL TESTS:  Berg Balance Scale: 41/56    PATIENT EDUCATION: Education details: assessment details, [employer] recommendations Person educated: Patient and Parent Education method: Explanation Education comprehension: verbalized understanding  HOME EXERCISE PROGRAM: N/A at this time, recommend continued from previous sessions  GOALS: Goals reviewed with patient? No, N/A at this time   ASSESSMENT:  CLINICAL IMPRESSION: Patient is a 37 y.o. male who was seen today for physical therapy evaluation and treatment for mobility and balance deficits from hx of neurological injury.  Recently received Botox injections to RUE/RLE and presents for evaluation per MD referral.  Assessment  reveals performance consistent with PLOF and ROM to same effect of previous sessions.  Demonstrates the exact same performance of Berg Balance Test of 41/56 and similar RLE strength manifest.  Patient does not demonstrate any substantial change in status or function and has exhausted visit limit for this calendar year.  Patient's father is very involved in care and they have been attending local fitness facility for aquatic and gym-based exercise to good effect.  They have a passive ROM/static-progressive splint for home-use to address his right ankle dorsiflexion ROM limits/spasticity management.  No further intervention indicated at this time.  Will provide referral to local fitness facility to collaborate with Personal Trainers to assist in exposure and development of routine to use strength training equipment to maintain current level of function.   OBJECTIVE IMPAIRMENTS: Abnormal gait, decreased balance, decreased coordination, difficulty walking, decreased ROM, decreased strength, and impaired tone.   ACTIVITY LIMITATIONS: carrying, lifting,  squatting, stairs, and locomotion level  PARTICIPATION LIMITATIONS:  no limitations/deviations from baseline  PERSONAL FACTORS: Time since onset of injury/illness/exacerbation are also affecting patient's functional outcome.   REHAB POTENTIAL: N/A at this time  CLINICAL DECISION MAKING: Stable/uncomplicated  EVALUATION COMPLEXITY: Low  PLAN:  PT FREQUENCY: one time visit  PT DURATION: N/A  PLANNED INTERVENTIONS: PT evaluation low complexity 97161  PLAN FOR NEXT SESSION: referral to [facility] Personal Training department   9:58 AM, 05/27/23 M. Shary Decamp, PT, DPT Physical Therapist- [place] Office Number: [phone]

## 2023-08-11 ENCOUNTER — Encounter: Payer: Self-pay | Admitting: Physical Medicine & Rehabilitation

## 2023-08-11 ENCOUNTER — Encounter: Payer: 59 | Attending: Physical Medicine & Rehabilitation | Admitting: Physical Medicine & Rehabilitation

## 2023-08-11 VITALS — BP 104/71 | HR 71 | Ht 67.0 in | Wt 174.0 lb

## 2023-08-11 DIAGNOSIS — G8111 Spastic hemiplegia affecting right dominant side: Secondary | ICD-10-CM | POA: Diagnosis present

## 2023-08-11 NOTE — Patient Instructions (Signed)
ALWAYS FEEL FREE TO CALL OUR OFFICE WITH ANY PROBLEMS OR QUESTIONS ([phone])  **PLEASE NOTE** ALL MEDICATION REFILL REQUESTS (INCLUDING CONTROLLED SUBSTANCES) NEED TO BE MADE AT LEAST 7 DAYS PRIOR TO REFILL BEING DUE. ANY REFILL REQUESTS INSIDE THAT TIME FRAME MAY RESULT IN DELAYS IN RECEIVING YOUR PRESCRIPTION.                    

## 2023-08-11 NOTE — Progress Notes (Addendum)
Subjective:    Patient ID: Paul Kane, male    DOB: 17-Nov-1985, 38 y.o.   MRN: [id number]  HPI  Paul Kane is here in follow up of his TBI/CVA and spastic right hemiparesis. We have performed xeomin injections x 3 last year with some benefit the last of which was in October.  He is still having issues with his right knee. It continues to snap back when he walks. He has pain in his knee and posterior  thigh after ambulating.  He currently is ambulating with only an AFO.  He has had knee cages and knee ankle-foot orthoses in the past which have not worked for various reasons.  The KAFO was too heavy per dad.  He received physical and occupational therapy after the last round of injections.  However he ran out of therapy visits in November and only received a handful of treatments.  He has been doing stretches and working on the upper extremity at home.  His parents help as well.  He denies any pain currently in his right upper extremity.  He still is unable to use the arm for a functional activities.  Pain Inventory Average Pain 0 Pain Right Now 0 My pain is intermittent and aching  In the last 24 hours, has pain interfered with the following? General activity 0 Relation with others 0 Enjoyment of life 0 What TIME of day is your pain at its worst? daytime Sleep (in general) Good  Pain is worse with: walking, bending, sitting, standing, and some activites Pain improves with: rest and therapy, heat Relief from Meds:  some relief with tylenol & patch  Family History  Problem Relation Age of Onset   Diabetes Other    Hyperlipidemia Other    Hypertension Other    Social History   Socioeconomic History   Marital status: Single    Spouse name: Not on file   Number of children: Not on file   Years of education: Not on file   Highest education level: Not on file  Occupational History   Not on file  Tobacco Use   Smoking status: Never   Smokeless tobacco: Never  Vaping Use   Vaping  status: Never Used  Substance and Sexual Activity   Alcohol use: No   Drug use: No   Sexual activity: Never  Other Topics Concern   Not on file  Social History Narrative   Not on file   Social Drivers of Health   Financial Resource Strain: Not on file  Food Insecurity: Low Risk  (02/12/2023)   Received from [facility]   Hunger Vital Sign    Worried About Running Out of Food in the Last Year: Never true    Ran Out of Food in the Last Year: Never true  Transportation Needs: Not on file (02/12/2023)  Recent Concern: Transportation Needs - Unmet Transportation Needs (02/12/2023)   Received from [facility]    In the past 12 months, has lack of reliable transportation kept you from medical appointments, meetings, work or from getting things needed for daily living? : Yes  Physical Activity: Not on file  Stress: Not on file  Social Connections: Not on file   Past Surgical History:  Procedure Laterality Date   cramiectomy     CRANIOPLASTY     REMOVAL OF GASTROSTOMY TUBE     VENTRICULOPERITONEAL SHUNT     Past Surgical History:  Procedure Laterality Date   cramiectomy  CRANIOPLASTY     REMOVAL OF GASTROSTOMY TUBE     VENTRICULOPERITONEAL SHUNT     Past Medical History:  Diagnosis Date   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HCC)    Chronic hepatitis B (HCC)    Fixed pupils    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HCC)    Hypertension    Intracranial injury of other and unspecified nature, without mention of open intracranial wound, unspecified state of consciousness    Intracranial shunt    Paralysis (HCC)    Spastic hemiplegia affecting dominant side (HCC)    Stroke (HCC)    Traumatic brain injury (HCC)    Trouble swallowing    Visual disturbance    Weakness    BP 104/71   Pulse 71   Ht 5\' 7"  (1.702 m)   Wt 174 lb (78.9 kg)   SpO2 96%   BMI 27.25 kg/m   Opioid Risk Score:   Fall Risk Score:  `1  Depression screen PHQ 2/9     08/11/2023     1:04 PM 02/03/2023   11:16 AM 10/28/2022    9:15 AM 08/12/2022    3:13 PM 03/04/2022    3:40 PM 10/01/2021    1:09 PM 06/04/2021    1:32 PM  Depression screen PHQ 2/9  Decreased Interest 0 1 1 1  0 0 0  Down, Depressed, Hopeless 0 1 1 1  0 0 0  PHQ - 2 Score 0 2 2 2  0 0 0    Review of Systems  Musculoskeletal:  Positive for back pain and gait problem.       Right leg pain  All other systems reviewed and are negative.      Objective:   Physical Exam General: No acute distress HEENT: NCAT, EOMI, oral membranes moist Cards: reg rate  Chest: normal effort Abdomen: Soft, NT, ND Skin: dry, intact Extremities: no edema Psych: pleasant and appropriate   Skin: intact Neuro: Patient is alert and oriented.  Speech remains very dysarthric.  Has reasonable insight and awareness.  Left-sided motor is 4 out of 5 proximal distal. RUE 2-3/5. RLE 4/5 prox to 1-2/5. Significant recurvatum at knee with stance.  RUE tone 1-2/4, tr RLE prox to 2-3/4 at heel cord.      Musculoskeletal: Right elbow and upper extremity much more easily range today with minimal pain.      1. History of traumatic brain injury with hydrocephalus, meningitis,   and right pontine stroke.   2. Spastic tetraplegia, dominant side more affected 3. severe oropharyngeal dysphagia--improved--tolerating diet well.  Continues on thickened liquids 4. Absence seizures 5. Mechanical Low back pain: due to posture/altered gait patterns--improved 6. Left great toe infection--podiatry f/u       PLAN:   1. Right elbow pad as needed to protect olecranon bursa  2.  hold on further botulinum injections for now. RUE fairly loose today 3.  Vimpat and keppra for seizure prophylaxis  4. tylenol or voltaren for pain 5. Consider new KAFO RLE for control  of knee. Knee brace falls down leg. Whatever we choose there will be some sort of drawback given his limited knee control during stance. I believe he has an old KAFO which was too bulky  6.  Outpatient referrals to [facility] OT/PT were placed with aquatic therapy requested within those orders. I asked family to bring old bracing with him to PT visit.      Twenty minutes of face to face patient care time were spent during  this visit. All questions were encouraged and answered.  Follow up with me in 3 mos .

## 2023-08-18 ENCOUNTER — Ambulatory Visit: Payer: 59 | Attending: Physical Medicine & Rehabilitation

## 2023-08-18 ENCOUNTER — Other Ambulatory Visit: Payer: Self-pay

## 2023-08-18 ENCOUNTER — Ambulatory Visit: Payer: 59 | Admitting: Occupational Therapy

## 2023-08-18 DIAGNOSIS — R2689 Other abnormalities of gait and mobility: Secondary | ICD-10-CM | POA: Insufficient documentation

## 2023-08-18 DIAGNOSIS — M6281 Muscle weakness (generalized): Secondary | ICD-10-CM | POA: Diagnosis present

## 2023-08-18 DIAGNOSIS — G8111 Spastic hemiplegia affecting right dominant side: Secondary | ICD-10-CM | POA: Diagnosis not present

## 2023-08-18 DIAGNOSIS — R278 Other lack of coordination: Secondary | ICD-10-CM | POA: Insufficient documentation

## 2023-08-18 DIAGNOSIS — R29898 Other symptoms and signs involving the musculoskeletal system: Secondary | ICD-10-CM

## 2023-08-18 DIAGNOSIS — R29818 Other symptoms and signs involving the nervous system: Secondary | ICD-10-CM | POA: Insufficient documentation

## 2023-08-18 DIAGNOSIS — R262 Difficulty in walking, not elsewhere classified: Secondary | ICD-10-CM | POA: Insufficient documentation

## 2023-08-18 DIAGNOSIS — R293 Abnormal posture: Secondary | ICD-10-CM | POA: Diagnosis present

## 2023-08-18 DIAGNOSIS — R2681 Unsteadiness on feet: Secondary | ICD-10-CM | POA: Insufficient documentation

## 2023-08-18 NOTE — Therapy (Signed)
OUTPATIENT PHYSICAL THERAPY NEURO EVALUATION   Patient Name: Paul Kane MRN: [id number] DOB:29-Jan-1986, 38 y.o., male Today's Date: 08/18/2023   PCP: Bailey Mech, PA-C REFERRING PROVIDER: Ranelle Oyster, MD  END OF SESSION:  PT End of Session - 08/18/23 0808     Visit Number 1    Authorization Type [employer]    PT Start Time 0805    PT Stop Time 0845    PT Time Calculation (min) 40 min             Past Medical History:  Diagnosis Date   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HCC)    Chronic hepatitis B (HCC)    Fixed pupils    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HCC)    Hypertension    Intracranial injury of other and unspecified nature, without mention of open intracranial wound, unspecified state of consciousness    Intracranial shunt    Paralysis (HCC)    Spastic hemiplegia affecting dominant side (HCC)    Stroke (HCC)    Traumatic brain injury (HCC)    Trouble swallowing    Visual disturbance    Weakness    Past Surgical History:  Procedure Laterality Date   cramiectomy     CRANIOPLASTY     REMOVAL OF GASTROSTOMY TUBE     VENTRICULOPERITONEAL SHUNT     Patient Active Problem List   Diagnosis Date Noted   SIRS due to infectious process with acute organ dysfunction (HCC) 03/14/2021   SIRS (systemic inflammatory response syndrome) (HCC) 03/12/2021   Syncope 03/12/2021   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) 03/12/2021   Hypokalemia 03/12/2021   Lactic acidosis 03/12/2021   Mixed hyperlipidemia 04/25/2018   Seizure disorder (HCC) 04/11/2018   Mechanical low back pain 05/12/2017   Dysarthria 12/20/2015   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/20/2012   Deafness, sensorineural 04/20/2012   Buzzing in ear 04/20/2012   Leaking percutaneous endoscopic gastrostomy (PEG) tube (HCC) 02/04/2012   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 pre-existing conscious levels,  sequela (HCC) 10/12/2011   Cerebral infarct (HCC) 10/12/2011   Spastic hemiplegia of right dominant side due to noncerebrovascular etiology (HCC) 10/12/2011   Dysphagia 10/12/2011   Functioning G-tube 03/24/2011   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   Hemiplegia of dominant side (HCC) 07/28/2010   CEREBRAL HEMORRHAGE 07/28/2010   PERSONAL HISTORY OF TRAUMATIC BRAIN INJURY 07/02/2010   Allergic rhinitis 10/25/2008   GASTROENTERITIS 08/09/2008   CONTACT DERMATITIS&OTHER ECZEMA DUE DETERGENTS 08/18/2007   Chronic hepatitis B virus infection (HCC) 06/28/2007   Chronic viral hepatitis B without delta-agent (HCC) 06/28/2007    ONSET DATE: TBI 2012  REFERRING DIAG: G81.11 (ICD-10-CM) - Spastic hemiplegia of right dominant side due to noncerebrovascular etiology  THERAPY DIAG:  Muscle weakness (generalized)  Unsteadiness on feet  Other abnormalities of gait and mobility  Difficulty in walking, not elsewhere classified  Abnormal posture  Rationale for Evaluation and Treatment: Rehabilitation  SUBJECTIVE:  SUBJECTIVE STATEMENT: Pt with hx of TBI and ongoing mobility dysfunction. Recent fall when slipped in home with pain to left foot.  Pt accompanied by: family member  PERTINENT HISTORY:   History of traumatic brain injury with hydrocephalus, meningitis,   and right pontine stroke.   2. Spastic tetraplegia, dominant side more affected 3. severe oropharyngeal dysphagia--improved--tolerating diet well.  Continues on thickened liquids 4. Absence seizures 5. Mechanical Low back pain: due to posture/altered gait patterns--improved 6. Left great toe infection--podiatry f/u  PAIN:  Are you having pain?  Has discomfort to dorsum of left foot from recent slide off of bed  PRECAUTIONS: Fall  RED  FLAGS: None   WEIGHT BEARING RESTRICTIONS: No  FALLS: Has patient fallen in last 6 months? Yes. Number of falls 1  LIVING ENVIRONMENT: Lives with: lives with their family Lives in: House/apartment Stairs:  ground floor set-up. 2-3 steps to enter Has following equipment at home: None  PLOF: Needs assistance with ADLs  PATIENT GOALS: reduce knee hyperextension  OBJECTIVE:  Note: Objective measures were completed at Evaluation unless otherwise noted.  DIAGNOSTIC FINDINGS: n/a for current episode  COGNITION: Overall cognitive status: History of cognitive impairments - at baseline   SENSATION: Not tested  COORDINATION: Grossly impaired RUE/RLE  EDEMA:  Slight bruising to dorsum of left foot  MUSCLE TONE: RLE: Hypertonic  MUSCLE LENGTH: Full left knee extension AROM and to passive stretch  DTRs:  NT  POSTURE: forward head  LOWER EXTREMITY ROM:     Active  Right Eval Left Eval  Hip flexion    Hip extension    Hip abduction    Hip adduction    Hip internal rotation    Hip external rotation    Knee flexion 120 120  Knee extension 0 0  Ankle dorsiflexion 5 15  Ankle plantarflexion Limited by brace 30  Ankle inversion    Ankle eversion     (Blank rows = not tested)  -no laxity appreciated to talar tilt or anterior drawer to left ankle  LOWER EXTREMITY MMT:     MMT Right Eval Left Eval  Hip flexion 4- 5  Hip extension      Hip abduction      Hip adduction      Hip internal rotation      Hip external rotation      Knee flexion 4 5  Knee extension 4+ 5  Ankle dorsiflexion 4 5  Ankle plantarflexion      Ankle inversion 4 5  Ankle eversion 5 5  (Blank rows = not tested)  BED MOBILITY:  independent  TRANSFERS: Assistive device utilized: None  Sit to stand: Complete Independence Stand to sit: Complete Independence Chair to chair: Complete Independence and Modified independence Floor: NT--reports modified independent using furniture for  support    CURB:  Level of Assistance: Modified independence and SBA Assistive device utilized: None Curb Comments:   STAIRS: Level of Assistance: Modified independence and SBA Stair Negotiation Technique: Step to Pattern with Single Rail on Left Number of Stairs:   Height of Stairs:   Comments:   GAIT: Gait pattern:  extensor thrust RLE into stance phase, decreased step length- Left, and genu recurvatum- Right Distance walked:  Assistive device utilized: None Level of assistance: Modified independence Comments:   FUNCTIONAL TESTS:  Timed up and go (TUG): 15 sec with and without Swedish knee cage [employer] Scale: 43/56 Gait speed: 1.42 ft/sec  TREATMENT DATE:     PATIENT EDUCATION: Education details: assessment details, discussed referral to P&O for KAFO/knee cage assessment to limit right knee extensor thrust Person educated: Patient Education method: Explanation Education comprehension: verbalized understanding  HOME EXERCISE PROGRAM:   GOALS: Goals reviewed with patient? No, N/A at this time    ASSESSMENT:  CLINICAL IMPRESSION: Patient is a 38 y.o. male who was seen today for physical therapy evaluation and treatment for mobility deficits related to hx of TBI and sequelae of the same.  Demonstrates functional mobility performance consistent with his baseline/PLOF and similar scores for outcome measures and walk speed and gait pattern.  Family reports pt complaining of right posterior knee pain when walking and I would assume this is from his extensor thrust in stance phase likely causing stress to posterior compartment.  Discussed benefit of device such as KAFO and/or Swedish knee cage to help mechanically limit this gait pattern and parent verbalizes understanding.  Pt is familiar to this clinic and continues to diligently attend local gym  for strength training with his father and activities in pool for balance and mobility per past plan of care details.  Recommend continued gym-based activities for his usual strength and aquatic activities and f/u with MD for referral to P&O for brace assessment. No PT services indicated at this time   OBJECTIVE IMPAIRMENTS: Abnormal gait, decreased balance, and difficulty walking.   ACTIVITY LIMITATIONS: locomotion level  PARTICIPATION LIMITATIONS: no limitations per his baseline  PERSONAL FACTORS: Time since onset of injury/illness/exacerbation are also affecting patient's functional outcome.   REHAB POTENTIAL: N/A at this time  CLINICAL DECISION MAKING: Stable/uncomplicated  EVALUATION COMPLEXITY: Low  PLAN:  PT FREQUENCY: one time visit  PT DURATION:   PLANNED INTERVENTIONS: PT evaluation, low complexity  PLAN FOR NEXT SESSION: N/A   9:12 AM, 08/18/23 M. Shary Decamp, PT, DPT Physical Therapist- [place] Office Number: [phone]

## 2023-08-18 NOTE — Therapy (Addendum)
OUTPATIENT OCCUPATIONAL THERAPY NEURO EVALUATION  Patient Name: Paul Kane MRN: [id number] DOB:1985-08-08, 38 y.o., male Today's Date: 08/18/2023  PCP: Bailey Mech, PA-C REFERRING PROVIDER: Ranelle Oyster, MD   END OF SESSION:  OT End of Session - 08/18/23 0956     Visit Number 1    Number of Visits 1    Authorization Type [employer] / Medicaid of [place]    OT Start Time 0845    OT Stop Time 0935    OT Time Calculation (min) 50 min    Activity Tolerance Patient tolerated treatment well    Behavior During Therapy WFL for tasks assessed/performed              Past Medical History:  Diagnosis Date   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HCC)    Chronic hepatitis B (HCC)    Fixed pupils    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HCC)    Hypertension    Intracranial injury of other and unspecified nature, without mention of open intracranial wound, unspecified state of consciousness    Intracranial shunt    Paralysis (HCC)    Spastic hemiplegia affecting dominant side (HCC)    Stroke (HCC)    Traumatic brain injury (HCC)    Trouble swallowing    Visual disturbance    Weakness    Past Surgical History:  Procedure Laterality Date   cramiectomy     CRANIOPLASTY     REMOVAL OF GASTROSTOMY TUBE     VENTRICULOPERITONEAL SHUNT     Patient Active Problem List   Diagnosis Date Noted   SIRS due to infectious process with acute organ dysfunction (HCC) 03/14/2021   SIRS (systemic inflammatory response syndrome) (HCC) 03/12/2021   Syncope 03/12/2021   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) 03/12/2021   Hypokalemia 03/12/2021   Lactic acidosis 03/12/2021   Mixed hyperlipidemia 04/25/2018   Seizure disorder (HCC) 04/11/2018   Mechanical low back pain 05/12/2017   Dysarthria 12/20/2015   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/20/2012   Deafness, sensorineural 04/20/2012   Buzzing in ear 04/20/2012   Leaking percutaneous endoscopic gastrostomy (PEG) tube (HCC)  02/04/2012   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 pre-existing conscious levels, sequela (HCC) 10/12/2011   Cerebral infarct (HCC) 10/12/2011   Spastic hemiplegia of right dominant side due to noncerebrovascular etiology (HCC) 10/12/2011   Dysphagia 10/12/2011   Functioning G-tube 03/24/2011   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   Hemiplegia of dominant side (HCC) 07/28/2010   CEREBRAL HEMORRHAGE 07/28/2010   PERSONAL HISTORY OF TRAUMATIC BRAIN INJURY 07/02/2010   Allergic rhinitis 10/25/2008   GASTROENTERITIS 08/09/2008   CONTACT DERMATITIS&OTHER ECZEMA DUE DETERGENTS 08/18/2007   Chronic hepatitis B virus infection (HCC) 06/28/2007   Chronic viral hepatitis B without delta-agent (HCC) 06/28/2007    ONSET DATE: referral date 08/11/2023   REFERRING DIAG: G81.11 (ICD-10-CM) - Spastic hemiplegia of right dominant side due to noncerebrovascular etiology (HCC)   THERAPY DIAG:  Other lack of coordination  Other symptoms and signs involving the nervous system  Other symptoms and signs involving the musculoskeletal system  Rationale for Evaluation and Treatment: Rehabilitation  SUBJECTIVE:   SUBJECTIVE STATEMENT: Pt's mother reported pt did not get Botox at most recent doctor visit.  Pt's father reported pt's arm is more flexible than previously and per pt, MD felt pt did not require Botox at this time.  Pt and family reported no changes since last OT visit. Pt's father reported hiring  personal trainer for pt at [facility] though has since discontinued personal training program.  Pt's mother reported pt stretches arm himself though has not been completing stretches as often lately.   Pt accompanied by: self and family member (mother and father)  PERTINENT HISTORY: previously received OT services 1. History of traumatic brain injury with hydrocephalus, meningitis,  and right pontine stroke (2011) 2. Spastic tetraplegia,  dominant side more affected  PRECAUTIONS: Fall and Other: h/o seizures  WEIGHT BEARING RESTRICTIONS: No  PAIN:  Are you having pain? No  FALLS: Has patient fallen in last 6 months? Yes. Number of falls 1  08/18/23 - Fall on 08/15/22, pt slipped off edge of bed, assistance to get up, no injuries beyond bruise on left foot and R elbow scrape.  LIVING ENVIRONMENT: Lives with: lives with their family Lives in: House/apartment Stairs: Yes: Internal: full flight to 2nd floor, but bedroom and bathroom on main floor and does not need to go up steps; can reach both and External: 3 steps; can reach both Has following equipment at home: shower chair and Grab bars  PLOF: Needs assistance with ADLs  PATIENT GOALS: Pt reported arms are doing well. Pt's father reported hope to loosen R shoulder and arm.  OBJECTIVE:  Note: Objective measures were completed at Evaluation unless otherwise noted.  HAND DOMINANCE: Right  ADLs: Overall ADLs: information taken from EVAL doc 03/01/23 as pt and father report no changes Mobility: Mod I around the house, hand held assist when in the community Grooming: Mod I for toothbrushing and flossing, family assist for shaving UB Dressing: Mod I LB Dressing: family assists with donning AFO and tying shoes, will assist with setup for clothing on RLE and then pt able to pull up Toileting: Mod I - utilizes bidet for hygiene Bathing: family assists with bathing, he completes ~90% Tub Shower transfers: Supervision/assist with shower transfers Equipment: Shower seat with back, and Walk in shower  IADLs: assists with housekeeping tasks with emptying dishwasher and folding clothes, wiping tabletops  MOBILITY STATUS: Hx of falls  UPPER EXTREMITY ROM:  R scapular winging and decreased scapular elevation  Active ROM Right eval Left eval PROM Right eval  Shoulder flexion 95* WFL 95*  Shoulder abduction 104*  105*  Shoulder adduction     Shoulder extension      Shoulder internal rotation     Shoulder external rotation     Elbow flexion 142*  142*  Elbow extension -44*  -38  Wrist flexion Unable  Approx. 55*  Wrist extension 15*  Approx. 80*  Wrist ulnar deviation     Wrist radial deviation     Wrist pronation     Wrist supination     (Blank rows = not tested)  HAND FUNCTION: Pt is able to manually open hand with great effort, most active movement is in index finger, able to oppose index finger to thumb.  Able to grasp/release pen between thumb and index finger with increased effort and extra time.   COORDINATION: Limited due to decreased grasp/release   MUSCLE TONE: RUE: RUE: Moderate, Hypertonic  COGNITION: Overall cognitive status: History of cognitive impairments - at baseline; father and mother present providing majority of information  PATIENT EDUCATION: Education details: OT role, eval only, OT eval results, strategies to reduce fall risk, pt's father asked about pool therapy and therefore OT educated on pool therapy vs. Continuing pool exercises ind, option to continue personal training program, importance of continuing HEP to maintain AROM/PROM of affected UE Person educated: Patient and Parent Education method: Explanation Education comprehension: verbalized understanding  HOME EXERCISE PROGRAM: 08/18/23 -  Recommend continued HEP from previous OT POC - Access code from 04/27/23 OT Note - [id number] (additional copy provided today at 08/18/23 eval).  Practice RUE functional tip pinch with pointer finger and thumb to pick up and release objects (e.g. pen, washcloth, etc.).  Practice opening and closing (flex/ext) all fingers of RUE with AROM.  Practice incorporating RUE for functional tasks (e.g. stabilizing objects with RUE).   GOALS: Goals reviewed with patient? No, N/A at this time   ASSESSMENT:  CLINICAL IMPRESSION: Patient  is a 38 y.o. male who was seen today for occupational therapy evaluation for Spastic hemiplegia of right dominant side due to noncerebrovascular etiology. Hx includes History of traumatic brain injury with hydrocephalus, meningitis,  and right pontine stroke (2011) and Spastic tetraplegia. Patient currently presents at baseline level of functioning. Assessment reveals performance consistent with PLOF and ROM as pt with similar AROM, including slightly increased AROM shoulder ABD, and good tolerance for self-PROM.  Pt does not demonstrate any significant change in status or function. OT provided additional copy of previous HEP. OT encouraged pt to continue HEP from previous sessions and practice functional use and AROM/PROM of affected UE as tolerated.   PERFORMANCE DEFICITS: in functional skills including ADLs, IADLs, coordination, dexterity, tone, ROM, strength, pain, flexibility, FM Coordination, GM Coordination, decreased knowledge of precautions, and UE functional use, cognitive skills including learn, memory, and safety awareness.   IMPAIRMENTS: are limiting patient from ADLs and IADLs.   CO-MORBIDITIES: may have co-morbidities  that affects occupational performance  MODIFICATION OR ASSISTANCE TO COMPLETE EVALUATION: Min-Moderate modification of tasks or assist with assess necessary to complete an evaluation.  OT OCCUPATIONAL PROFILE AND HISTORY: Problem focused assessment: Including review of records relating to presenting problem.  CLINICAL DECISION MAKING: LOW - limited treatment options, no task modification necessary  REHAB POTENTIAL: N/A at this time  EVALUATION COMPLEXITY: Low    PLAN:  OT FREQUENCY: N/A - eval only  OT DURATION: N/A - eval only  PLANNED INTERVENTIONS: N/A - eval only   CONSULTED AND AGREED WITH PLAN OF CARE: Pt and parents  PLAN FOR NEXT SESSION:  N/A - eval only   Wynetta Emery, OTR/L 08/18/2023, 10:13 AM   [facility] [address] [place], [place] [address] Phone # [phone] Fax # [phone]

## 2023-08-19 ENCOUNTER — Telehealth: Payer: Self-pay | Admitting: *Deleted

## 2023-08-19 NOTE — Telephone Encounter (Signed)
Mrs Kauk came to the office and is asking for the Rx for the brace you talked about with them to go to [facility] and an order for aquatic therapy.

## 2023-08-26 ENCOUNTER — Encounter: Payer: Self-pay | Admitting: *Deleted

## 2023-08-30 ENCOUNTER — Telehealth: Payer: Self-pay | Admitting: Physical Medicine & Rehabilitation

## 2023-08-30 NOTE — Telephone Encounter (Signed)
 Mom came into office states son was not accepted to PT or OT , they informed patient that since son has not had any changes they can not treat. Also mentioned aquatic therapy and they did not offer either . Mom is willing to go with [facility] because she states her son needs it and the location at [place] is not going to accept patient .   Mom asked about brace and informed her patient needs PT/OT

## 2023-11-09 NOTE — Progress Notes (Unsigned)
 Subjective:    Patient ID: Paul Kane, male    DOB: December 27, 1985, 38 y.o.   MRN: [id number]  HPI  Paul Kane is here in follow up of his CVA/TBI and spastic right hemiparesis.  He went for PT and OT evaluations and they did not go past the evaluations as therapist felt he really was at his baseline.  Family was disappointed as they wanted more rehab.  He is receiving some exercises and work through a Systems analyst.  They still go to the gym and pull at times as well. Paul Kane has an AFO that he does not wear consistently.  He usually walks without a brace at home.  He is at fall risk sometimes tripping on carpet and when he changes directions in particular.  He also is complaining of a cyst on his left arm which has been there for some time.  It may be a bit larger than it has been previously and he is focused on it.   Pain Inventory Average Pain 0 Pain Right Now 0 My pain is  No pain  LOCATION OF PAIN  Intermittent pain in the right leg  BOWEL Number of stools per week: 2 Oral laxative use No    BLADDER Normal    Mobility walk without assistance ability to climb steps?  yes do you drive?  no Do you have any goals in this area?  yes  Function I need assistance with the following:  feeding, dressing, bathing, and household duties Do you have any goals in this area?  yes  Neuro/Psych trouble walking spasms  Prior Studies Any changes since last visit?  no  Physicians involved in your care Any changes since last visit?  no   Family History  Problem Relation Age of Onset   Diabetes Other    Hyperlipidemia Other    Hypertension Other    Social History   Socioeconomic History   Marital status: Single    Spouse name: Not on file   Number of children: Not on file   Years of education: Not on file   Highest education level: Not on file  Occupational History   Not on file  Tobacco Use   Smoking status: Never   Smokeless tobacco: Never  Vaping Use   Vaping  status: Never Used  Substance and Sexual Activity   Alcohol use: No   Drug use: No   Sexual activity: Never  Other Topics Concern   Not on file  Social History Narrative   Not on file   Social Drivers of Health   Financial Resource Strain: Not on file  Food Insecurity: Low Risk  (02/12/2023)   Received from [facility]   Hunger Vital Sign    Worried About Running Out of Food in the Last Year: Never true    Ran Out of Food in the Last Year: Never true  Transportation Needs: Not on file (02/12/2023)  Recent Concern: Transportation Needs - Unmet Transportation Needs (02/12/2023)   Received from [employer]    In the past 12 months, has lack of reliable transportation kept you from medical appointments, meetings, work or from getting things needed for daily living? : Yes  Physical Activity: Not on file  Stress: Not on file  Social Connections: Not on file   Past Surgical History:  Procedure Laterality Date   cramiectomy     CRANIOPLASTY     REMOVAL OF GASTROSTOMY TUBE     VENTRICULOPERITONEAL SHUNT  Past Medical History:  Diagnosis Date   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HCC)    Chronic hepatitis B (HCC)    Fixed pupils    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HCC)    Hypertension    Intracranial injury of other and unspecified nature, without mention of open intracranial wound, unspecified state of consciousness    Intracranial shunt    Paralysis (HCC)    Spastic hemiplegia affecting dominant side (HCC)    Stroke (HCC)    Traumatic brain injury (HCC)    Trouble swallowing    Visual disturbance    Weakness    There were no vitals taken for this visit.  Opioid Risk Score:   Fall Risk Score:  `1  Depression screen PHQ 2/9     08/11/2023    1:04 PM 02/03/2023   11:16 AM 10/28/2022    9:15 AM 08/12/2022    3:13 PM 03/04/2022    3:40 PM 10/01/2021    1:09 PM 06/04/2021    1:32 PM  Depression screen PHQ 2/9  Decreased Interest 0 1 1 1  0 0 0   Down, Depressed, Hopeless 0 1 1 1  0 0 0  PHQ - 2 Score 0 2 2 2  0 0 0    Review of Systems  Musculoskeletal:  Positive for gait problem.  All other systems reviewed and are negative.      Objective:   Physical Exam General: No acute distress HEENT: NCAT, EOMI, oral membranes moist Cards: reg rate  Chest: normal effort Abdomen: Soft, NT, ND Skin: dry, intact Extremities: no edema Psych: pleasant and appropriate  Skin: intact Neuro: Patient is alert and oriented.  Speech remains very dysarthric.  Has reasonable insight and awareness.  Remains dysarthric left-sided motor is 4 out of 5 proximal distal. RUE 2-3/5. RLE 4/5 prox to 1-2/5. Significant recurvatum at knee with stance.  RUE tone 2-3/4, tr RLE prox to 1/4 prox to 2/5 distally with heel cord tight still.  He walks with a steppage gait on the right side and does not clear the right foot very well despite the AFO.  His father holds onto them for support.     Musculoskeletal: Right elbow and upper extremity much more easily range today with minimal pain.      1. History of traumatic brain injury with hydrocephalus, meningitis,   and right pontine stroke.   2. Spastic tetraplegia, dominant side more affected 3. severe oropharyngeal dysphagia--improved--tolerating diet well.  Continues on thickened liquids 4. Absence seizures 5. Mechanical Low back pain: due to posture/altered gait patterns--improved 6. Left great toe infection--podiatry f/u       PLAN:   1. Right elbow pad as needed to protect olecranon bursa  2.  hold on further botulinum injections for now. RUE tight but near baseline.  Could consider Botox at next visit 3.  Vimpat  and keppra  for seizure prophylaxis  4. tylenol  or voltaren  for pain 5. We have discussed gait mechanics, safety at home.  Recommended water walking in the pool as well as strengthening exercises for his lower extremity musculature. 6. Outpatient PT/OT evals performed at [facility]. Family  would like second opinion. Will refer to [facility] for gait and mobility evaluation.        Twenty minutes of face to face patient care time were spent during this visit. All questions were encouraged and answered.  Follow up with me in 4 mos .

## 2023-11-10 ENCOUNTER — Encounter: Payer: 59 | Attending: Physical Medicine & Rehabilitation | Admitting: Physical Medicine & Rehabilitation

## 2023-11-10 ENCOUNTER — Encounter: Payer: Self-pay | Admitting: Physical Medicine & Rehabilitation

## 2023-11-10 VITALS — BP 102/68 | HR 72 | Ht 67.0 in | Wt 173.0 lb

## 2023-11-10 DIAGNOSIS — G8111 Spastic hemiplegia affecting right dominant side: Secondary | ICD-10-CM | POA: Diagnosis present

## 2023-11-10 NOTE — Patient Instructions (Signed)
 ALWAYS FEEL FREE TO CALL OUR OFFICE WITH ANY PROBLEMS OR QUESTIONS [phone])  **PLEASE NOTE** ALL MEDICATION REFILL REQUESTS (INCLUDING CONTROLLED SUBSTANCES) NEED TO BE MADE AT LEAST 7 DAYS PRIOR TO REFILL BEING DUE. ANY REFILL REQUESTS INSIDE THAT TIME FRAME MAY RESULT IN DELAYS IN RECEIVING YOUR PRESCRIPTION.

## 2023-12-08 ENCOUNTER — Encounter: Attending: Physical Medicine & Rehabilitation | Admitting: Physical Medicine & Rehabilitation

## 2023-12-08 ENCOUNTER — Encounter: Payer: Self-pay | Admitting: Physical Medicine & Rehabilitation

## 2023-12-08 VITALS — BP 92/62 | Ht 67.0 in | Wt 171.8 lb

## 2023-12-08 DIAGNOSIS — S069XAA Unspecified intracranial injury with loss of consciousness status unknown, initial encounter: Secondary | ICD-10-CM | POA: Insufficient documentation

## 2023-12-08 DIAGNOSIS — S062X5S 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 pre-existing conscious levels, sequela: Secondary | ICD-10-CM | POA: Insufficient documentation

## 2023-12-08 DIAGNOSIS — G8111 Spastic hemiplegia affecting right dominant side: Secondary | ICD-10-CM | POA: Diagnosis not present

## 2023-12-08 DIAGNOSIS — R29818 Other symptoms and signs involving the nervous system: Secondary | ICD-10-CM | POA: Insufficient documentation

## 2023-12-08 NOTE — Progress Notes (Signed)
 Subjective:    Patient ID: Paul Kane, male    DOB: 06-Jan-1986, 38 y.o.   MRN: [id number]  HPI  Rasool is here in follow up of his TBI/CVA. He is having pain along his  right medial arch and the 1st MT.  He has a jointed thermoplastic AFO from [employer] which he wears for gait, and he seems to have more pain when he's wearing the orthotic. He has a double upright brace which causes some discomfort also. Strangely enough when he walks barefoot at home he claims it doesn't feel as bAD  He had evaluations by outpt therapy for his gait ([facility]). He starts first session today.   Otherwise he's doing well at home.   Pain Inventory Average Pain 2 Pain Right Now 0 My pain is intermittent and sharp  In the last 24 hours, has pain interfered with the following? General activity 4 Relation with others 4 Enjoyment of life 4 What TIME of day is your pain at its worst? daytime Sleep (in general) Good  Pain is worse with: walking and standing Pain improves with: rest and medication Relief from Meds: 5  Family History  Problem Relation Age of Onset   Diabetes Other    Hyperlipidemia Other    Hypertension Other    Social History   Socioeconomic History   Marital status: Single    Spouse name: Not on file   Number of children: Not on file   Years of education: Not on file   Highest education level: Not on file  Occupational History   Not on file  Tobacco Use   Smoking status: Never   Smokeless tobacco: Never  Vaping Use   Vaping status: Never Used  Substance and Sexual Activity   Alcohol use: No   Drug use: No   Sexual activity: Never  Other Topics Concern   Not on file  Social History Narrative   Not on file   Social Drivers of Health   Financial Resource Strain: Not on file  Food Insecurity: Low Risk  (02/12/2023)   Received from [facility]   Hunger Vital Sign    Worried About Running Out of Food in the Last Year: Never true    Ran Out of Food in the  Last Year: Never true  Transportation Needs: Not on file (02/12/2023)  Recent Concern: Transportation Needs - Unmet Transportation Needs (02/12/2023)   Received from [employer]    In the past 12 months, has lack of reliable transportation kept you from medical appointments, meetings, work or from getting things needed for daily living? : Yes  Physical Activity: Not on file  Stress: Not on file  Social Connections: Not on file   Past Surgical History:  Procedure Laterality Date   cramiectomy     CRANIOPLASTY     REMOVAL OF GASTROSTOMY TUBE     VENTRICULOPERITONEAL SHUNT     Past Surgical History:  Procedure Laterality Date   cramiectomy     CRANIOPLASTY     REMOVAL OF GASTROSTOMY TUBE     VENTRICULOPERITONEAL SHUNT     Past Medical History:  Diagnosis Date   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HCC)    Chronic hepatitis B (HCC)    Fixed pupils    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HCC)    Hypertension    Intracranial injury of other and unspecified nature, without mention of open intracranial wound, unspecified state of consciousness    Intracranial  shunt    Paralysis (HCC)    Spastic hemiplegia affecting dominant side (HCC)    Stroke [facility])    Traumatic brain injury (HCC)    Trouble swallowing    Visual disturbance    Weakness    BP 92/62 (BP Location: Left Arm, Patient Position: Sitting)   Ht 5\' 7"  (1.702 m)   Wt 171 lb 12.8 oz (77.9 kg)   SpO2 97%   BMI 26.91 kg/m   Opioid Risk Score:   Fall Risk Score:  `1  Depression screen [facility] 2/9     12/08/2023   10:13 AM 11/10/2023   11:48 AM 08/11/2023    1:04 PM 02/03/2023   11:16 AM 10/28/2022    9:15 AM 08/12/2022    3:13 PM 03/04/2022    3:40 PM  Depression screen PHQ 2/9  Decreased Interest 0 0 0 1 1 1  0  Down, Depressed, Hopeless 0 0 0 1 1 1  0  PHQ - 2 Score 0 0 0 2 2 2  0     Review of Systems  Musculoskeletal:  Positive for gait problem.  All other systems reviewed and are negative.       Objective:   Physical Exam General: No acute distress HEENT: NCAT, EOMI, oral membranes moist Cards: reg rate  Chest: normal effort Abdomen: Soft, NT, ND Skin: dry, intact Extremities: no edema Psych: pleasant and appropriate   Skin: intact Neuro: Patient is alert and oriented.  Speech remains very dysarthric.  Has reasonable insight and awareness.  Remains dysarthric left-sided motor is 4 out of 5 proximal distal. RUE 2-3/5. RLE 4/5 prox to 1-2/5. Significant recurvatum at knee with stance.  RUE tone 2-3/4, tr RLE prox to 1/4 prox to 2/5 distally with heel cord tight still.  He walks with steppage gait pattern and strikes the right foot with MT's primarily and stresses the knee forefoot. AFO may be a little worn from a padding standpoint. Has arch built into it.      Musculoskeletal: Right elbow and upper extremity much more easily range today with minimal pain.      1. History of traumatic brain injury with hydrocephalus, meningitis,   and right pontine stroke.   2. Spastic tetraplegia, dominant side more affected 3. severe oropharyngeal dysphagia--improved--tolerating diet well.  Continues on thickened liquids 4. Absence seizures 5. Mechanical Low back pain: due to posture/altered gait patterns--improved 6. Left great toe infection--podiatry f/u       PLAN:   1. Right elbow pad as needed to protect olecranon bursa  2.  hold on further botulinum injections for now.   3.  Vimpat  and keppra  for seizure prophylaxis  4. tylenol  or voltaren  for pain 5. Again this is gait mechanics, safety at home.  Discussed this at length with pt/dad. They're going to [employer] to assess his AFO's. Perhaps increased arch and cushioning may help. However the biggest problem is his mechanics. He has so little room for error with his knee control as well. 6. Outpatient PT/OT evals at [facility].  First session today.      Twenty minutes of face to face patient care time were spent during this  visit. All questions were encouraged and answered.  Follow up with me in 4 mos .

## 2023-12-08 NOTE — Patient Instructions (Signed)
 ALWAYS FEEL FREE TO CALL OUR OFFICE WITH ANY PROBLEMS OR QUESTIONS [phone])  **PLEASE NOTE** ALL MEDICATION REFILL REQUESTS (INCLUDING CONTROLLED SUBSTANCES) NEED TO BE MADE AT LEAST 7 DAYS PRIOR TO REFILL BEING DUE. ANY REFILL REQUESTS INSIDE THAT TIME FRAME MAY RESULT IN DELAYS IN RECEIVING YOUR PRESCRIPTION.

## 2024-03-02 ENCOUNTER — Ambulatory Visit: Attending: Physician Assistant | Admitting: Speech Pathology

## 2024-03-02 ENCOUNTER — Encounter: Payer: Self-pay | Admitting: Speech Pathology

## 2024-03-02 DIAGNOSIS — R41841 Cognitive communication deficit: Secondary | ICD-10-CM | POA: Insufficient documentation

## 2024-03-02 DIAGNOSIS — R471 Dysarthria and anarthria: Secondary | ICD-10-CM | POA: Insufficient documentation

## 2024-03-02 DIAGNOSIS — R4701 Aphasia: Secondary | ICD-10-CM | POA: Insufficient documentation
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
## 2024-03-03 ENCOUNTER — Other Ambulatory Visit: Payer: Self-pay | Admitting: Surgery

## 2024-03-07 ENCOUNTER — Ambulatory Visit: Admitting: Speech Pathology

## 2024-03-08 ENCOUNTER — Encounter: Attending: Physical Medicine & Rehabilitation | Admitting: Physical Medicine & Rehabilitation

## 2024-03-08 ENCOUNTER — Encounter: Payer: Self-pay | Admitting: Physical Medicine & Rehabilitation

## 2024-03-08 VITALS — BP 98/65 | HR 69 | Ht 67.0 in | Wt 171.0 lb

## 2024-03-08 DIAGNOSIS — S062X5S 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 pre-existing conscious levels, sequela: Secondary | ICD-10-CM | POA: Insufficient documentation

## 2024-03-08 DIAGNOSIS — G8111 Spastic hemiplegia affecting right dominant side: Secondary | ICD-10-CM | POA: Insufficient documentation

## 2024-03-08 NOTE — Patient Instructions (Signed)
 ALWAYS FEEL FREE TO CALL OUR OFFICE WITH ANY PROBLEMS OR QUESTIONS [phone])  **PLEASE NOTE** ALL MEDICATION REFILL REQUESTS (INCLUDING CONTROLLED SUBSTANCES) NEED TO BE MADE AT LEAST 7 DAYS PRIOR TO REFILL BEING DUE. ANY REFILL REQUESTS INSIDE THAT TIME FRAME MAY RESULT IN DELAYS IN RECEIVING YOUR PRESCRIPTION.
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
## 2024-03-16 ENCOUNTER — Encounter: Payer: Self-pay | Admitting: Speech Pathology

## 2024-03-16 ENCOUNTER — Ambulatory Visit: Admitting: Speech Pathology

## 2024-03-16 DIAGNOSIS — R471 Dysarthria and anarthria: Secondary | ICD-10-CM | POA: Diagnosis not present
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
## 2024-03-23 ENCOUNTER — Encounter: Payer: Self-pay | Admitting: Speech Pathology

## 2024-03-23 ENCOUNTER — Ambulatory Visit: Attending: Physician Assistant | Admitting: Speech Pathology

## 2024-03-23 DIAGNOSIS — R41841 Cognitive communication deficit: Secondary | ICD-10-CM | POA: Diagnosis present

## 2024-03-23 DIAGNOSIS — R471 Dysarthria and anarthria: Secondary | ICD-10-CM | POA: Insufficient documentation

## 2024-03-23 DIAGNOSIS — R4701 Aphasia: Secondary | ICD-10-CM | POA: Insufficient documentation
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
## 2024-03-28 ENCOUNTER — Ambulatory Visit: Admitting: Speech Pathology

## 2024-04-04 ENCOUNTER — Ambulatory Visit: Admitting: Speech Pathology

## 2024-04-05 ENCOUNTER — Telehealth: Payer: Self-pay

## 2024-04-05 NOTE — Telephone Encounter (Signed)
 [employer] denied Botox 03/30/24-09/26/24

## 2024-04-13 ENCOUNTER — Ambulatory Visit: Admitting: Speech Pathology

## 2024-04-13 ENCOUNTER — Encounter: Payer: Self-pay | Admitting: Speech Pathology

## 2024-04-13 DIAGNOSIS — R4701 Aphasia: Secondary | ICD-10-CM

## 2024-04-13 DIAGNOSIS — R41841 Cognitive communication deficit: Secondary | ICD-10-CM

## 2024-04-13 DIAGNOSIS — R471 Dysarthria and anarthria: Secondary | ICD-10-CM | POA: Diagnosis not present

## 2024-04-13 NOTE — Telephone Encounter (Signed)
 I submitted autho for Xeomin  and Dysport. I put in there how long he has been getting Botox.
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
## 2024-04-17 NOTE — Telephone Encounter (Signed)
 I submitted an auth for Xeomin  and Dysport. How many units for Xeomin  and how many units for Dysport?

## 2024-04-19 ENCOUNTER — Encounter: Admitting: Physical Medicine & Rehabilitation

## 2024-04-19 ENCOUNTER — Encounter (HOSPITAL_COMMUNITY): Payer: Self-pay | Admitting: Surgery

## 2024-04-19 ENCOUNTER — Other Ambulatory Visit: Payer: Self-pay

## 2024-04-19 ENCOUNTER — Encounter: Payer: Self-pay | Admitting: Physical Medicine & Rehabilitation

## 2024-04-19 VITALS — BP 101/67 | HR 64 | Ht 67.0 in | Wt 168.8 lb

## 2024-04-19 MED ORDER — INCOBOTULINUMTOXINA 100 UNITS IM SOLR: 400.0000 [IU] | Freq: Once | INTRAMUSCULAR | Status: AC

## 2024-04-19 MED ORDER — ONABOTULINUMTOXINA 100 UNITS IJ SOLR: 100.0000 [IU] | Freq: Once | INTRAMUSCULAR | Status: DC

## 2024-04-19 MED ORDER — SODIUM CHLORIDE (PF) 0.9 % IJ SOLN: 10.0000 mL | INTRAMUSCULAR | Status: AC | PRN
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
## 2024-04-19 NOTE — Anesthesia Preprocedure Evaluation (Signed)
 Anesthesia Evaluation  Patient identified by MRN, date of birth, ID band  History of Anesthesia Complications Negative for: history of anesthetic complications  Airway Mallampati: III       Dental  (+) Dental Advisory Given   Pulmonary neg recent URI   breath sounds clear to auscultation       Cardiovascular hypertension,  Rhythm:Regular Rate:Normal     Neuro/Psych Seizures -,  Traumatic Brain Injury; Spastic hemiparesis. CVA    GI/Hepatic ,,,(+) Hepatitis -, B  Endo/Other  neg diabetes    Renal/GU Renal disease     Musculoskeletal   Abdominal   Peds  Hematology   Anesthesia Other Findings   Reproductive/Obstetrics                              Anesthesia Physical Anesthesia Plan  ASA: 3  Anesthesia Plan: MAC   Post-op Pain Management:    Induction: Intravenous  PONV Risk Score and Plan: 2 and Ondansetron   Airway Management Planned:   Additional Equipment:   Intra-op Plan:   Post-operative Plan: Extubation in OR  Informed Consent:      Dental advisory given  Plan Discussed with:   Anesthesia Plan Comments: (PAT note written 04/19/2024 by Isaiah Ruder, PA-C.  )         Anesthesia Quick Evaluation

## 2024-04-19 NOTE — Progress Notes (Signed)
 SDW call  Patient's mom, [name] was given pre-op instructions over the phone. She verbalized understanding of instructions provided. She denied patient having any SOB, fever or cough. She states patients last seizure was over 2 years ago.      PCP - Dr. Rosalva Catchings Neurologist: Dr. Adriana Hazel, [facility]ologist -  Pulmonary:    PPM/ICD - denies Device Orders - na Rep Notified - na   Chest x-ray - na EKG -  DOS, 04/20/2024 Stress Test - ECHO - 03/13/2021 Cardiac Cath -   Sleep Study/sleep apnea/CPAP: denies  Non-diabetic  ERAS Protcol - Clears until 1000   Anesthesia review: Yes. Seizures, VP shunt, herniation brain stem, HTN, stroke, hemiplegic, TBI  Your procedure is scheduled on Thursday April 20, 2024  Report to [facility] Main Entrance A at  1030  A.M., then check in with the Admitting office.  Call this number if you have problems the morning of surgery:  [phone]   If you have any questions prior to your surgery date call [phone]: Open Monday-Friday 8am-4pm If you experience any cold or flu symptoms such as cough, fever, chills, shortness of breath, etc. between now and your scheduled surgery, please notify us  at the above number    Remember:  Do not eat after midnight the night before your surgery  You may drink clear liquids until  1000   the morning of your surgery.   Clear liquids allowed are: Water, Non-Citrus Juices (without pulp), Carbonated Beverages, Clear Tea, Black Coffee ONLY (NO MILK, CREAM OR POWDERED CREAMER of any kind), and Gatorade   Take these medicines the morning of surgery with A SIP OF WATER:  Vimpat , keppra   As needed:   As of today, STOP taking any Aspirin (unless otherwise instructed by your surgeon) Aleve, Naproxen, Ibuprofen, Motrin, Advil, Goody's, BC's, all herbal medications, fish oil, and all vitamins.

## 2024-04-19 NOTE — Progress Notes (Signed)
   Subjective:    Patient ID: Paul Kane, male    DOB: April 16, 1986, 38 y.o.   MRN: [id number]  HPI  Visit cancelled as xeomin  not yet approved. He has surgery tomorrow on left arm to remove ?lipoma or cyst  Review of Systems     Objective:   Physical Exam        Assessment & Plan:

## 2024-04-20 ENCOUNTER — Encounter (HOSPITAL_COMMUNITY)
Admission: RE | Disposition: A | Discharge status: Home / Self Care | Payer: Self-pay | Source: Home / Self Care | Attending: Surgery

## 2024-04-20 ENCOUNTER — Encounter (HOSPITAL_COMMUNITY): Payer: Self-pay | Admitting: Vascular Surgery

## 2024-04-20 ENCOUNTER — Ambulatory Visit (HOSPITAL_COMMUNITY)
Admission: RE | Admit: 2024-04-20 | Discharge: 2024-04-20 | Disposition: A | Discharge status: Home / Self Care | Attending: Surgery | Admitting: Surgery

## 2024-04-20 ENCOUNTER — Encounter (HOSPITAL_COMMUNITY): Payer: Self-pay | Admitting: Surgery

## 2024-04-20 DIAGNOSIS — R2232 Localized swelling, mass and lump, left upper limb: Secondary | ICD-10-CM | POA: Insufficient documentation

## 2024-04-20 DIAGNOSIS — I10 Essential (primary) hypertension: Secondary | ICD-10-CM | POA: Insufficient documentation

## 2024-04-20 DIAGNOSIS — B181 Chronic viral hepatitis B without delta-agent: Secondary | ICD-10-CM | POA: Insufficient documentation

## 2024-04-20 DIAGNOSIS — Z539 Procedure and treatment not carried out, unspecified reason: Secondary | ICD-10-CM | POA: Insufficient documentation

## 2024-04-20 LAB — BASIC METABOLIC PANEL WITH GFR
Anion gap: 8 (ref 5–15)
BUN: 7 mg/dL (ref 6–20)
CO2: 24 mmol/L (ref 22–32)
Calcium: 9 mg/dL (ref 8.9–10.3)
Chloride: 106 mmol/L (ref 98–111)
Creatinine, Ser: 0.67 mg/dL (ref 0.61–1.24)
GFR, Estimated: >60 mL/min (ref >60)
Glucose, Bld: 94 mg/dL (ref 70–99)
Potassium: 3.5 mmol/L (ref 3.5–5.1)
Sodium: 138 mmol/L (ref 135–145)

## 2024-04-20 LAB — CBC
HCT: 42.9 % (ref 39.0–52.0)
Hemoglobin: 14.1 g/dL (ref 13.0–17.0)
MCH: 30.1 pg (ref 26.0–34.0)
MCHC: 32.9 g/dL (ref 30.0–36.0)
MCV: 91.5 fL (ref 80.0–100.0)
Platelets: 195 K/uL (ref 150–400)
RBC: 4.69 MIL/uL (ref 4.22–5.81)
RDW: 12.2 % (ref 11.5–15.5)
WBC: 6 K/uL (ref 4.0–10.5)
nRBC: 0 % (ref 0.0–0.2)

## 2024-04-20 SURGERY — EXCISION, MASS, UPPER EXTREMITY
Anesthesia: Monitor Anesthesia Care | Laterality: Left

## 2024-04-20 MED ORDER — CEFAZOLIN SODIUM-DEXTROSE 2-4 GM/100ML-% IV SOLN
2.0000 g | INTRAVENOUS | Status: DC
  Filled 2024-04-20: qty 100

## 2024-04-20 MED ORDER — ACETAMINOPHEN 500 MG PO TABS
1000.0000 mg | ORAL_TABLET | ORAL | Status: DC
  Filled 2024-04-20: qty 2

## 2024-04-20 MED ORDER — CHLORHEXIDINE GLUCONATE 0.12 % MT SOLN
15.0000 mL | Freq: Once | OROMUCOSAL | Status: AC
  Administered 2024-04-20: 15 mL via OROMUCOSAL
  Filled 2024-04-20: qty 15

## 2024-04-20 MED ORDER — ORAL CARE MOUTH RINSE: 15.0000 mL | Freq: Once | OROMUCOSAL | Status: AC

## 2024-04-20 MED ORDER — CHLORHEXIDINE GLUCONATE CLOTH 2 % EX PADS: 6.0000 | MEDICATED_PAD | Freq: Once | CUTANEOUS | Status: DC

## 2024-04-20 MED ORDER — LACTATED RINGERS IV SOLN: INTRAVENOUS | Status: DC

## 2024-04-20 NOTE — Progress Notes (Signed)
 Patient ID: Paul Kane, male   DOB: 11-Dec-1985, 38 y.o.   MRN: [id number]   The patient is accompanied by his parents this morning.  They gave him his seizure medication at 830 this morning with nectar thick fluids.  After discussion with anesthesia he would need to either undergo general anesthesia to do the procedure this morning versus late in the afternoon where it can be done under MAC anesthesia as planned.  As this is an elective procedure and not emergent, after discussion with the family, we have decided to cancel surgery today and will reschedule it at a later date 7:30 in the morning and would hold his seizure medication until after the procedure.  There is well like to hold on the general anesthesia given his history of the chronic brain injury and seizures.  I will notify my office and we will get the surgery rescheduled

## 2024-04-20 NOTE — Progress Notes (Signed)
 Pt takes thickened liquids only. He took his seizure medications with some apple juice with INSTANT FOOD THICKENER POWD at 0830 am. Per dr. Waddell and Dr. Vernetta are aware. Surgery needs to be rescheduled. OR is aware.

## 2024-04-24 NOTE — Telephone Encounter (Signed)
 Xeomin  approved 04/17/24-04/17/25

## 2024-04-26 ENCOUNTER — Encounter: Attending: Physical Medicine & Rehabilitation | Admitting: Physical Medicine & Rehabilitation

## 2024-04-26 ENCOUNTER — Encounter: Payer: Self-pay | Admitting: Physical Medicine & Rehabilitation

## 2024-04-26 VITALS — BP 103/69 | HR 72 | Ht 67.0 in | Wt 168.0 lb

## 2024-04-26 DIAGNOSIS — G8111 Spastic hemiplegia affecting right dominant side: Secondary | ICD-10-CM | POA: Insufficient documentation

## 2024-04-26 MED ORDER — INCOBOTULINUMTOXINA 100 UNITS IM SOLR
400.0000 [IU] | Freq: Once | INTRAMUSCULAR | Status: AC
  Administered 2024-04-26: 400 [IU] via INTRAMUSCULAR

## 2024-04-26 NOTE — Progress Notes (Signed)
 XEOMIN  Injection for spasticity using needle EMG guidance Indication: Spastic hemiplegia of right dominant side due to noncerebrovascular etiology (HCC) - Plan: incobotulinumtoxinA  (XEOMIN ) 100 units injection 400 Units, Ambulatory referral to Physical Therapy, Ambulatory referral to Occupational Therapy  G81.1 Dilution: 100 Units/ml        Total Units Injected: 400 Indication: Severe spasticity which interferes with ADL,mobility and/or  hygiene and is unresponsive to medication management and other conservative care Informed consent was obtained after describing risks and benefits of the procedure with the patient. This includes bleeding, bruising, infection, excessive weakness, or medication side effects. A REMS form is on file and signed.  right Needle: 50mm injectable monopolar needle electrode  Number of units per muscle Pectoralis Major 0 units Pectoralis Minor 0 units Biceps 100 units Brachioradialis 50 units FCR 0 units FCU 0 units FDS 25 units FDP 25 units FPL 0 units Pronator Teres 0 units Pronator Quadratus 0 units Lumbricals 100 units Quadriceps 0 units Gastroc/soleus 0 units Hamstrings 0 units Tibialis Posterior 100 units Tibialis Anterior 0 units EHL 0 units All injections were done after obtaining appropriate EMG activity and after negative drawback for blood. The patient tolerated the procedure well. Post procedure instructions were given. Return in about 3 months (around 07/27/2024) for f/u xeomin  injections RUE and RLE 500 u.

## 2024-04-26 NOTE — Patient Instructions (Signed)
 ALWAYS FEEL FREE TO CALL OUR OFFICE WITH ANY PROBLEMS OR QUESTIONS [phone])  **PLEASE NOTE** ALL MEDICATION REFILL REQUESTS (INCLUDING CONTROLLED SUBSTANCES) NEED TO BE MADE AT LEAST 7 DAYS PRIOR TO REFILL BEING DUE. ANY REFILL REQUESTS INSIDE THAT TIME FRAME MAY RESULT IN DELAYS IN RECEIVING YOUR PRESCRIPTION.

## 2024-05-04 ENCOUNTER — Ambulatory Visit: Admitting: Speech Pathology

## 2024-05-09 ENCOUNTER — Ambulatory Visit: Attending: Physician Assistant | Admitting: Speech Pathology

## 2024-05-09 ENCOUNTER — Encounter: Payer: Self-pay | Admitting: Speech Pathology

## 2024-05-09 DIAGNOSIS — R471 Dysarthria and anarthria: Secondary | ICD-10-CM | POA: Diagnosis present
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
## 2024-05-17 ENCOUNTER — Other Ambulatory Visit: Payer: Self-pay | Admitting: Surgery

## 2024-05-17 ENCOUNTER — Other Ambulatory Visit: Payer: Self-pay

## 2024-05-17 ENCOUNTER — Encounter (HOSPITAL_COMMUNITY): Payer: Self-pay | Admitting: Surgery
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
## 2024-05-17 NOTE — Anesthesia Preprocedure Evaluation (Addendum)
 Anesthesia Evaluation  Patient identified by MRN, date of birth, ID band Patient awake    Reviewed: Allergy & Precautions, NPO status , Patient's Chart, lab work & pertinent test results, reviewed documented beta blocker date and time   History of Anesthesia Complications Negative for: history of anesthetic complications  Airway Mallampati: III  TM Distance: >3 FB   Mouth opening: Limited Mouth Opening  Dental no notable dental hx.    Pulmonary neg COPD   breath sounds clear to auscultation       Cardiovascular hypertension, (-) angina (-) Past MI  Rhythm:Regular Rate:Normal     Neuro/Psych Seizures -, Well Controlled,  CVA    GI/Hepatic ,neg GERD  ,,(+) neg Cirrhosis      , Hepatitis -, BChronic aspiration of thin liquids   Endo/Other    Renal/GU      Musculoskeletal   Abdominal   Peds  Hematology   Anesthesia Other Findings   Reproductive/Obstetrics                              Anesthesia Physical Anesthesia Plan  ASA: 2  Anesthesia Plan: MAC   Post-op Pain Management:    Induction: Intravenous  PONV Risk Score and Plan: 1 and Ondansetron  and Propofol infusion  Airway Management Planned: Natural Airway and Simple Face Mask  Additional Equipment:   Intra-op Plan:   Post-operative Plan:   Informed Consent: I have reviewed the patients History and Physical, chart, labs and discussed the procedure including the risks, benefits and alternatives for the proposed anesthesia with the patient or authorized representative who has indicated his/her understanding and acceptance.     Dental advisory given  Plan Discussed with: CRNA  Anesthesia Plan Comments: (PAT note written 04/19/2024 by Isaiah Ruder, PA-C. 04/20/2024 surgery cancelled because he had medications with nectar thick liquids. He/guardian will be instructed to hold his morning medications that have to be given  with nectar thick liquids until after his 7:30 AM scheduled surgery.   EKG on 04/20/2024 showed NSR, incomplete RBBB. Labs on 10/08/22/2023: Lab Results from 04/20/2024 showed WBC 6.0, H/H 14.1/42.9, PLT 195, glucose 94, Na 138, K 3.5, Cr 0.67.                   )         Anesthesia Quick Evaluation

## 2024-05-17 NOTE — Progress Notes (Signed)
 SDW call  Patient 's mother, [name] was given pre-op instructions over the phone. She verbalized understanding of instructions provided. She denied patient having any SOB, fever or cough    PCP - Dr. Rosalva Catchings Neurologist: Dr. Adriana Hazel, [facility] Cardiologist -  Pulmonary:    PPM/ICD - denies Device Orders - na Rep Notified - na   Chest x-ray - na EKG -  04/20/2024 Stress Test - ECHO - 03/13/2021 Cardiac Cath -   Sleep Study/sleep apnea/CPAP: denies  Non-diabetic  Blood Thinner Instructions: denies Aspirin Instructions:denies   ERAS Protcol - NPO   Anesthesia review: Yes. HTN, stroke, paralysis, seizures, VP shunt, hemiplegic, TBI  Your procedure is scheduled on Thursday May 18, 2024  Report to [facility] Main Entrance A at  0530  A.M., then check in with the Admitting office.  Call this number if you have problems the morning of surgery:  [phone]   If you have any questions prior to your surgery date call [phone]: Open Monday-Friday 8am-4pm If you experience any cold or flu symptoms such as cough, fever, chills, shortness of breath, etc. between now and your scheduled surgery, please notify us  at the above number    Remember:  Do not eat or drink after midnight the night before your surgery  Take these medicines the morning of surgery with A SIP OF WATER:  None  As of today, STOP taking any Aspirin (unless otherwise instructed by your surgeon) Aleve, Naproxen, Ibuprofen, Motrin, Advil, Goody's, BC's, all herbal medications, fish oil, and all vitamins.

## 2024-05-18 ENCOUNTER — Ambulatory Visit (HOSPITAL_COMMUNITY): Payer: Self-pay | Admitting: Vascular Surgery

## 2024-05-18 ENCOUNTER — Ambulatory Visit (HOSPITAL_COMMUNITY)
Admission: RE | Admit: 2024-05-18 | Discharge: 2024-05-18 | Disposition: A | Discharge status: Home / Self Care | Attending: Surgery | Admitting: Surgery

## 2024-05-18 ENCOUNTER — Encounter (HOSPITAL_COMMUNITY): Admission: RE | Disposition: A | Payer: Self-pay | Source: Home / Self Care | Attending: Surgery

## 2024-05-18 ENCOUNTER — Ambulatory Visit: Admitting: Speech Pathology

## 2024-05-18 ENCOUNTER — Encounter (HOSPITAL_COMMUNITY): Payer: Self-pay | Admitting: Surgery

## 2024-05-18 ENCOUNTER — Ambulatory Visit (HOSPITAL_BASED_OUTPATIENT_CLINIC_OR_DEPARTMENT_OTHER): Payer: Self-pay | Admitting: Vascular Surgery

## 2024-05-18 DIAGNOSIS — L720 Epidermal cyst: Secondary | ICD-10-CM | POA: Diagnosis not present

## 2024-05-18 DIAGNOSIS — R2232 Localized swelling, mass and lump, left upper limb: Secondary | ICD-10-CM

## 2024-05-18 DIAGNOSIS — I10 Essential (primary) hypertension: Secondary | ICD-10-CM | POA: Diagnosis not present

## 2024-05-18 DIAGNOSIS — G40119 Localization-related (focal) (partial) symptomatic epilepsy and epileptic syndromes with simple partial seizures, intractable, without status epilepticus: Secondary | ICD-10-CM | POA: Diagnosis not present

## 2024-05-18 DIAGNOSIS — Z87820 Personal history of traumatic brain injury: Secondary | ICD-10-CM | POA: Insufficient documentation

## 2024-05-18 DIAGNOSIS — R569 Unspecified convulsions: Secondary | ICD-10-CM

## 2024-05-18 DIAGNOSIS — I639 Cerebral infarction, unspecified: Secondary | ICD-10-CM | POA: Diagnosis not present

## 2024-05-18 DIAGNOSIS — B181 Chronic viral hepatitis B without delta-agent: Secondary | ICD-10-CM | POA: Insufficient documentation

## 2024-05-18 HISTORY — PX: EXCISION, MASS, UPPER EXTREMITY: SHX7567

## 2024-05-18 SURGERY — EXCISION, MASS, UPPER EXTREMITY
Anesthesia: Monitor Anesthesia Care | Site: Arm Upper | Laterality: Left

## 2024-05-18 MED ORDER — LIDOCAINE-EPINEPHRINE 1 %-1:100000 IJ SOLN
INTRAMUSCULAR | Status: DC | PRN
  Administered 2024-05-18: 20 mL

## 2024-05-18 MED ORDER — CHLORHEXIDINE GLUCONATE 0.12 % MT SOLN: 15.0000 mL | Freq: Once | OROMUCOSAL | Status: DC

## 2024-05-18 MED ORDER — FENTANYL CITRATE (PF) 100 MCG/2ML IJ SOLN: 25.0000 ug | INTRAMUSCULAR | Status: DC | PRN

## 2024-05-18 MED ORDER — ORAL CARE MOUTH RINSE: 15.0000 mL | Freq: Once | OROMUCOSAL | Status: AC

## 2024-05-18 MED ORDER — CHLORHEXIDINE GLUCONATE CLOTH 2 % EX PADS: 6.0000 | MEDICATED_PAD | Freq: Once | CUTANEOUS | Status: DC

## 2024-05-18 MED ORDER — BUPIVACAINE-EPINEPHRINE 0.25% -1:200000 IJ SOLN: INTRAMUSCULAR | Status: DC | PRN

## 2024-05-18 MED ORDER — MIDAZOLAM HCL (PF) 2 MG/2ML IJ SOLN
INTRAMUSCULAR | Status: DC | PRN
  Administered 2024-05-18: 2 mg via INTRAVENOUS

## 2024-05-18 MED ORDER — CEFAZOLIN SODIUM-DEXTROSE 2-4 GM/100ML-% IV SOLN
2.0000 g | INTRAVENOUS | Status: AC
  Administered 2024-05-18: 2 g via INTRAVENOUS
  Filled 2024-05-18: qty 100

## 2024-05-18 MED ORDER — LIDOCAINE-EPINEPHRINE 1 %-1:100000 IJ SOLN
INTRAMUSCULAR | Status: AC
  Filled 2024-05-18: qty 1

## 2024-05-18 MED ORDER — PROPOFOL 10 MG/ML IV BOLUS
INTRAVENOUS | Status: AC
Start: 2024-05-18 — End: 2024-05-18
  Filled 2024-05-18: qty 20

## 2024-05-18 MED ORDER — FENTANYL CITRATE (PF) 250 MCG/5ML IJ SOLN
INTRAMUSCULAR | Status: AC
  Filled 2024-05-18: qty 5

## 2024-05-18 MED ORDER — LACTATED RINGERS IV SOLN: INTRAVENOUS | Status: DC

## 2024-05-18 MED ORDER — 0.9 % SODIUM CHLORIDE (POUR BTL) OPTIME
TOPICAL | Status: DC | PRN
  Administered 2024-05-18: 1000 mL

## 2024-05-18 MED ORDER — ONDANSETRON HCL 4 MG/2ML IJ SOLN: 4.0000 mg | Freq: Once | INTRAMUSCULAR | Status: DC | PRN

## 2024-05-18 MED ORDER — TRAMADOL HCL 50 MG PO TABS: 50.0000 mg | ORAL_TABLET | Freq: Four times a day (QID) | ORAL | 0 refills | Status: AC | PRN

## 2024-05-18 MED ORDER — MIDAZOLAM HCL 2 MG/2ML IJ SOLN
INTRAMUSCULAR | Status: AC
Start: 2024-05-18 — End: 2024-05-18
  Filled 2024-05-18: qty 2

## 2024-05-18 MED ORDER — BUPIVACAINE-EPINEPHRINE (PF) 0.25% -1:200000 IJ SOLN
INTRAMUSCULAR | Status: AC
  Filled 2024-05-18: qty 30

## 2024-05-18 MED ORDER — OXYCODONE HCL 5 MG PO TABS: 5.0000 mg | ORAL_TABLET | Freq: Once | ORAL | Status: DC | PRN

## 2024-05-18 MED ORDER — PROPOFOL 500 MG/50ML IV EMUL
INTRAVENOUS | Status: DC | PRN
  Administered 2024-05-18: 125 ug/kg/min via INTRAVENOUS

## 2024-05-18 MED ORDER — OXYCODONE HCL 5 MG/5ML PO SOLN: 5.0000 mg | Freq: Once | ORAL | Status: DC | PRN

## 2024-05-18 MED ORDER — ACETAMINOPHEN 10 MG/ML IV SOLN: 1000.0000 mg | Freq: Once | INTRAVENOUS | Status: DC | PRN

## 2024-05-18 MED ORDER — CHLORHEXIDINE GLUCONATE 0.12 % MT SOLN
OROMUCOSAL | Status: AC
  Administered 2024-05-18: 15 mL via OROMUCOSAL
  Filled 2024-05-18: qty 15

## 2024-05-18 SURGICAL SUPPLY — 27 items
BAG COUNTER SPONGE SURGICOUNT (BAG) ×1 IMPLANT
CANISTER SUCTION 3000ML PPV (SUCTIONS) IMPLANT
COVER SURGICAL LIGHT HANDLE (MISCELLANEOUS) ×1 IMPLANT
DERMABOND ADVANCED .7 DNX12 (GAUZE/BANDAGES/DRESSINGS) ×1 IMPLANT
DRAPE LAPAROSCOPIC ABDOMINAL (DRAPES) IMPLANT
DRAPE LAPAROTOMY 100X72 PEDS (DRAPES) IMPLANT
DRSG TEGADERM 4X4.75 (GAUZE/BANDAGES/DRESSINGS) ×1 IMPLANT
ELECTRODE REM PT RTRN 9FT ADLT (ELECTROSURGICAL) ×1 IMPLANT
GAUZE SPONGE 4X4 12PLY STRL (GAUZE/BANDAGES/DRESSINGS) ×1 IMPLANT
GAUZE SPONGE 4X4 12PLY STRL LF (GAUZE/BANDAGES/DRESSINGS) IMPLANT
GLOVE SURG SIGNA 7.5 PF LTX (GLOVE) ×1 IMPLANT
GOWN STRL REUS W/ TWL LRG LVL3 (GOWN DISPOSABLE) ×1 IMPLANT
GOWN STRL REUS W/ TWL XL LVL3 (GOWN DISPOSABLE) ×1 IMPLANT
KIT BASIN OR (CUSTOM PROCEDURE TRAY) ×1 IMPLANT
KIT TURNOVER KIT B (KITS) ×1 IMPLANT
NDL HYPO 25GX1X1/2 BEV (NEEDLE) ×1 IMPLANT
NEEDLE HYPO 25GX1X1/2 BEV (NEEDLE) ×1 IMPLANT
PACK GENERAL/GYN (CUSTOM PROCEDURE TRAY) ×1 IMPLANT
PAD ARMBOARD POSITIONER FOAM (MISCELLANEOUS) ×1 IMPLANT
PENCIL SMOKE EVACUATOR (MISCELLANEOUS) ×1 IMPLANT
SOLN 0.9% NACL POUR BTL 1000ML (IV SOLUTION) ×1 IMPLANT
SUT MNCRL AB 4-0 PS2 18 (SUTURE) ×1 IMPLANT
SUT VIC AB 3-0 SH 27XBRD (SUTURE) ×1 IMPLANT
SYR CONTROL 10ML LL (SYRINGE) ×1 IMPLANT
TAPE CLOTH 4X10 WHT NS (GAUZE/BANDAGES/DRESSINGS) IMPLANT
TOWEL GREEN STERILE (TOWEL DISPOSABLE) ×1 IMPLANT
TOWEL GREEN STERILE FF (TOWEL DISPOSABLE) ×1 IMPLANT

## 2024-05-18 NOTE — Discharge Instructions (Signed)
 Ok to Designer, Television/film Set pack,tylenol , and ibuprofen also for pain  No vigorous activity for one week

## 2024-05-18 NOTE — Op Note (Signed)
   Paul Kane 05/18/2024   Pre-op Diagnosis: LEFT UPPER ARM MASS (5 cm)     Post-op Diagnosis: LEFT UPPER ARM SEBACEOUS CYST (5 CM)  Procedure(s): EXCISION LEFT UPPER ARM MASS (5 CM)  Surgeon(s): Vernetta Berg, MD  Anesthesia: Monitor Anesthesia Care  Staff:  Circulator: Perley Rollo BIRCH, RN Scrub Person: Bari Birmingham, RN  Estimated Blood Loss: Minimal               Specimens: sent to path  Findings: The 5 cm mass on the left upper arm laterally appeared consistent with a sebaceous cyst.  Procedure: The patient was brought to the operating room and identified as the correct patient.  We left him in his stretcher.  Anesthesia was induced.  His left upper arm was prepped and draped in the usual sterile fashion.  I anesthetized the skin overlying the palpable mass with lidocaine .  I then made a longitudinal incision with a scalpel.  I then dissected down to the mass which was just superficial and entered a cyst consistent with sebaceous cyst.  There was a large amount of sebaceous debris which was removed.  I then was able to excise the entire capsule using the scalpel and electrocautery.  The capsule was sent to pathology for evaluation.  I irrigated the wound extensively with saline.  I anesthetized it further with Marcaine.  Hemostasis appeared to be achieved.  I then closed the subcutaneous tissue with interrupted 3-0 Vicryl sutures and closed the skin with a running 4-0 Monocryl.  Dermabond, gauze, and tape were then applied.  The patient tolerated the procedure well.  All the counts were correct at the end the procedure.  He was then taken in a stable condition from the operating room to the recovery room.          Berg Vernetta   Date: 05/18/2024  Time: 8:08 AM

## 2024-05-18 NOTE — Interval H&P Note (Signed)
 History and Physical Interval Note:no change in H and P  05/18/2024 7:07 AM  Paul Kane  has presented today for surgery, with the diagnosis of LEFT UPPER ARM MASS.  The various methods of treatment have been discussed with the patient and family. After consideration of risks, benefits and other options for treatment, the patient has consented to  Procedure(s) with comments: EXCISION, MASS, UPPER EXTREMITY (Left) - LEFT UPPER ARM MASS MAC as a surgical intervention.  The patient's history has been reviewed, patient examined, no change in status, stable for surgery.  I have reviewed the patient's chart and labs.  Questions were answered to the patient's satisfaction.     [name]

## 2024-05-18 NOTE — Transfer of Care (Signed)
 Immediate Anesthesia Transfer of Care Note  Patient: Loomis E Gruen  Procedure(s) Performed: EXCISION, MASS LEFT UPPER EXTREMITY (Left: Arm Upper)  Patient Location: PACU  Anesthesia Type:MAC  Level of Consciousness: awake, alert , and oriented  Airway & Oxygen Therapy: Patient Spontanous Breathing  Post-op Assessment: Report given to RN and Post -op Vital signs reviewed and stable  Post vital signs: Reviewed and stable  Last Vitals:  Vitals Value Taken Time  BP    Temp    Pulse 84 05/18/24 08:09  Resp 15 05/18/24 08:10  SpO2 92 % 05/18/24 08:09  Vitals shown include unfiled device data.  Last Pain:  Vitals:   05/18/24 0543  TempSrc: Oral         Complications: No notable events documented.

## 2024-05-19 ENCOUNTER — Encounter (HOSPITAL_COMMUNITY): Payer: Self-pay | Admitting: Surgery

## 2024-05-19 LAB — SURGICAL PATHOLOGY

## 2024-05-19 NOTE — Anesthesia Postprocedure Evaluation (Signed)
 Anesthesia Post Note  Patient: Paul Kane  Procedure(s) Performed: EXCISION, MASS LEFT UPPER EXTREMITY (Left: Arm Upper)     Patient location during evaluation: PACU Anesthesia Type: MAC Level of consciousness: awake and alert Pain management: pain level controlled Vital Signs Assessment: post-procedure vital signs reviewed and stable Respiratory status: spontaneous breathing, nonlabored ventilation, respiratory function stable and patient connected to nasal cannula oxygen Cardiovascular status: blood pressure returned to baseline and stable Postop Assessment: no apparent nausea or vomiting Anesthetic complications: no   No notable events documented.  Last Vitals:  Vitals:   05/18/24 0830 05/18/24 0845  BP: 114/78 117/78  Pulse: 79 69  Resp: 16 13  Temp:  36.4 C  SpO2: 100% 100%    Last Pain:  Vitals:   05/18/24 0809  TempSrc:   PainSc: Asleep                 Lynwood MARLA Cornea

## 2024-06-05 ENCOUNTER — Ambulatory Visit: Admitting: Physical Therapy

## 2024-06-05 ENCOUNTER — Ambulatory Visit: Attending: Physical Medicine & Rehabilitation | Admitting: Occupational Therapy

## 2024-06-05 ENCOUNTER — Encounter: Payer: Self-pay | Admitting: Physical Therapy

## 2024-06-05 DIAGNOSIS — G8111 Spastic hemiplegia affecting right dominant side: Secondary | ICD-10-CM | POA: Insufficient documentation

## 2024-06-05 DIAGNOSIS — M6281 Muscle weakness (generalized): Secondary | ICD-10-CM

## 2024-06-05 DIAGNOSIS — R2681 Unsteadiness on feet: Secondary | ICD-10-CM | POA: Diagnosis present

## 2024-06-05 DIAGNOSIS — I69351 Hemiplegia and hemiparesis following cerebral infarction affecting right dominant side: Secondary | ICD-10-CM | POA: Diagnosis present

## 2024-06-05 DIAGNOSIS — R2689 Other abnormalities of gait and mobility: Secondary | ICD-10-CM | POA: Insufficient documentation

## 2024-06-05 DIAGNOSIS — R293 Abnormal posture: Secondary | ICD-10-CM

## 2024-06-05 NOTE — Therapy (Signed)
 OUTPATIENT OCCUPATIONAL THERAPY NEURO EVALUATION  Patient Name: Paul Kane MRN: [id number] DOB:June 02, 1986, 38 y.o., , male Today's Date: 06/05/2024  PCP: Darilyn Rosalva Bruckner, PA-C REFERRING PROVIDER: Babs Arthea DASEN, MD  END OF SESSION:  OT End of Session - 06/05/24 1406     Visit Number 1    Number of Visits 4    Date for Recertification  07/05/24    Authorization Type [employer]/M[employer]    OT Start Time 1100    OT Stop Time 1145    OT Time Calculation (min) 45 min    Activity Tolerance Patient tolerated treatment well    Behavior During Therapy WFL for tasks assessed/performed          Past Medical History:  Diagnosis Date   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HCC)    Chronic hepatitis B (HCC)    Fixed pupils    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HCC)    Hypertension    Intracranial injury of other and unspecified nature, without mention of open intracranial wound, unspecified state of consciousness    Intracranial shunt    Paralysis (HCC)    Spastic hemiplegia affecting dominant side (HCC)    Stroke (HCC)    Traumatic brain injury (HCC)    Trouble swallowing    PATIENT TAKES MEDICATIONS WITH THICKENED LIQUIDS   Visual disturbance    Weakness    Past Surgical History:  Procedure Laterality Date   cramiectomy     CRANIOPLASTY     EXCISION, MASS, UPPER EXTREMITY Left 05/18/2024   Procedure: EXCISION, MASS LEFT UPPER EXTREMITY;  Surgeon: Vernetta Berg, MD;  Location: [facility] OR;  Service: General;  Laterality: Left;  LEFT UPPER ARM MASS MAC   REMOVAL OF GASTROSTOMY TUBE     VENTRICULOPERITONEAL SHUNT     Patient Active Problem List   Diagnosis Date Noted   SIRS due to infectious process with acute organ dysfunction (HCC) 03/14/2021   SIRS (systemic inflammatory response syndrome) (HCC) 03/12/2021   Syncope 03/12/2021   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) 03/12/2021   Hypokalemia 03/12/2021   Lactic acidosis 03/12/2021   Mixed  hyperlipidemia 04/25/2018   Seizure disorder (HCC) 04/11/2018   Mechanical low back pain 05/12/2017   Dysarthria 12/20/2015   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/20/2012   Deafness, sensorineural 04/20/2012   Buzzing in ear 04/20/2012   Leaking percutaneous endoscopic gastrostomy (PEG) tube (HCC) 02/04/2012   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 pre-existing conscious levels, sequela 10/12/2011   Cerebral infarct (HCC) 10/12/2011   Spastic hemiplegia of right dominant side due to noncerebrovascular etiology (HCC) 10/12/2011   Dysphagia 10/12/2011   Functioning G-tube 03/24/2011   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   Hemiplegia of dominant side (HCC) 07/28/2010   CEREBRAL HEMORRHAGE 07/28/2010   PERSONAL HISTORY OF TRAUMATIC BRAIN INJURY 07/02/2010   Allergic rhinitis 10/25/2008   GASTROENTERITIS 08/09/2008   CONTACT DERMATITIS&OTHER ECZEMA DUE DETERGENTS 08/18/2007   Chronic hepatitis B virus infection (HCC) 06/28/2007   Chronic viral hepatitis B without delta-agent (HCC) 06/28/2007    ONSET DATE: 04/26/2024 (referral date)   REFERRING DIAG: G81.11 (ICD-10-CM) - Spastic hemiplegia of right dominant side due to noncerebrovascular etiology (HCC)  DX: TBI with spasticity right hemiparesis s/p botox to elbow and finger flexors.  RX: Eval and treat, rom, spasticity mgt, improve functional use of RUE.  THERAPY DIAG:  Hemiplegia and hemiparesis following cerebral infarction affecting right dominant side [facility])  Rationale for Evaluation and Treatment: Rehabilitation  SUBJECTIVE:   SUBJECTIVE STATEMENT: Pt mostly nonverbal - can say a few words. Pt's mother expressed that it is getting harder for her to take of patient and her husband (pt's father) is dealing with medical issues himself and cannot take care of him Pt accompanied by: MOTHER  PERTINENT HISTORY: previously received OT services 1. History of traumatic brain injury with  hydrocephalus, meningitis,  and right pontine stroke (2011) 2. Spastic tetraplegia, dominant side more affected  PRECAUTIONS: Fall and Other: h/o seizures  WEIGHT BEARING RESTRICTIONS: No  PAIN:  Are you having pain? Unable to verbalize but does not appear in any pain  FALLS: Has patient fallen in last 6 months? Yes. Number of falls 4-5 (avg 1-2 month)  LIVING ENVIRONMENT: Lives with: lives with their family Lives in: House Stairs: Yes: Internal: full flight to 2nd floor, but bedroom and bathroom on main floor and does not need to go up steps; can reach both and External: 3 steps; can reach both Has following equipment at home: shower chair and Grab bars   PLOF: Needs assistance with ADLs  PATIENT GOALS: splint  OBJECTIVE:  Note: Objective measures were completed at Evaluation unless otherwise noted.  HAND DOMINANCE: Rt handed prior to 2011, but now uses Lt hand  ADLs: Overall ADLs: Pt is mostly able to feed himself (pureed foods) w/ Lt hand, brush teeth w/ electric toothbrush, mod I w/ shirt (per mom report), max assist w/ LE dressing, and assist w/ bathing (mom gets in shower w/ patient) Transfers/ambulation related to ADLs: requires hand held assist to be safe/prevent falls  Equipment: Shower seat with back and Grab bars for shower  IADLs: dependent  MOBILITY STATUS: Needs Assist: hand held assist to be safe/prevent falls  POSTURE COMMENTS:  rounded shoulders Sitting balance: can sit without UE support    UPPER EXTREMITY ROM:  LUE AROM WNL's RUE limited by spasticity, with shoulder movement to approx midrange, some elbow movement but limited in extension due to tone, no functional use of hand.  Passive supination limited to neutral and passive MP extension difficult in hand   HAND FUNCTION: No functional use Rt hand however mom reports he does use as a stabilizer for some tasks (holding jacket against body w/ Rt hand/arm while zipping Lt hand)  COORDINATION: Pt  has gross grasp, limited active extension  SENSATION: Not tested   EDEMA: none  MUSCLE TONE: RUE: Moderate and Hypertonic  COGNITION: Overall cognitive status: History of cognitive impairments - at baseline;  mother present providing majority of information. Pt can follow simple instructions w/ demo  VISION: not formally assessed d/t cognitive status however vision is impaired from stroke and may be contributing to falls    PERCEPTION: Not tested  PRAXIS: Not tested  OBSERVATIONS: Pt would benefit from brief O.T. to address splinting needs and update HEP for RUE following botox, however no changes from when last seen by O.T. (pt has been longstanding pt of [facility] outpatient services as well as other other private outpatient centers)  TREATMENT DATE: 06/05/24    Discussed with mother that O.T. is only picking up for 3-4 visits for: new splint, updated stretching HEP for RUE, and possible A/E options for shoes (lace options and shoe horn/funnel). Expressed gently that pt has not changed status to justify further skilled O.T. beyond these visits.   Pt's mother concerned as she is getting older and finding it more difficult to care for patient and w/ her husband/pt's father sick, she is feeling overwhelmed. Pt's mother may benefit from aide coming to assist in caring for patient daily if insurance helps cover, or a day program for patient. Pt's mother encouraged to discuss further with MD and look into information that P.T. gave her at P.T. evaluation earlier today.   Pt/mother shown use of shoe funnel and demo, however caused shoe funnel to crack therefore may benefit from shoe horn or another option (clip on shoe horn).   Also discussed fall prevention strategies including: using urinal or BSC at night as pt has fallen trying to go to bathroom at night;  eliminating throw rugs and keeping paths clear, and assist/DME prn. However, pt is also at higher risk for falls based on motor deficits, spasticity and decreased vision which is not something skilled therapy can change as this is chronic in nature.     PATIENT EDUCATION: Education details: see above Person educated: Pt's mother Education method: Explanation and demo use of shoe funnel Education comprehension: verbalized understanding  HOME EXERCISE PROGRAM: N/A today   GOALS: Goals reviewed with patient? Yes   LONG TERM GOALS: Target date: 07/05/24  Pt/family independent with RUE stretching HEP  Baseline:  Goal status: INITIAL  2.  Pt/family independent with splint wear and care (resting hand splint for day, bean bag splint for elbow at night) Baseline:  Goal status: INITIAL  3.  Pt/family to verbalize understanding with A/E for shoes (shoe horn, adapted shoe lace options)  Baseline:  Goal status: INITIAL   ASSESSMENT:  CLINICAL IMPRESSION: Patient is a 38 y.o. male who was seen today for occupational therapy evaluation for chronic spastic hemiplegia Rt dominant side from TBI and stroke in 2011. Pt w/ recent botox injections to Rt hand - FDS, FDP, and lumbricals. Pt is familiar to this clinic and other [facility] outpatient settings as well. Family (mother) present today and reports difficulty for caring for patient and stretching patient is getting harder. Pt may benefit from aide coming to home to provide assist w/ ADLs and stretching program. Pt would benefit from short duration of skilled O.T. to address splinting needs, update stretching HEP for RUE following botox and further A/E exploration  PERFORMANCE DEFICITS: in functional skills including ADLs, IADLs, tone, ROM, decreased knowledge of use of DME, and UE functional use,.   IMPAIRMENTS: are limiting patient from ADLs.   CO-MORBIDITIES: may have co-morbidities  that affects occupational performance. Patient will  benefit from skilled OT to address above impairments and improve overall function.  MODIFICATION OR ASSISTANCE TO COMPLETE EVALUATION: Min-Moderate modification of tasks or assist with assess necessary to complete an evaluation.  OT OCCUPATIONAL PROFILE AND HISTORY: Detailed assessment: Review of records and additional review of physical, cognitive, psychosocial history related to current functional performance.  CLINICAL DECISION MAKING: Moderate - several treatment options, min-mod task modification necessary  REHAB POTENTIAL: Fair time since onset and severity of deficits, however good potential for goals   EVALUATION COMPLEXITY: Moderate    PLAN:  OT FREQUENCY: 1x/week  OT DURATION: 4 weeks  PLANNED  INTERVENTIONS: 97535 self care/ADL training, 02889 therapeutic exercise, 97530 therapeutic activity, 97112 neuromuscular re-education, 97010 moist heat, 97760 Orthotic Initial, 97763 Orthotic/Prosthetic subsequent, passive range of motion, visual/perceptual remediation/compensation, patient/family education, and DME and/or AE instructions  RECOMMENDED OTHER SERVICES: none at this time  CONSULTED AND AGREED WITH PLAN OF CARE: Patient and family member/caregiver  PLAN FOR NEXT SESSION: fabricate resting hand splint w/ MP's in greater extension and slight bend to PIPs to prevent hyperextension, and thumb IP joint in flexion as able (thumb IP joint is hyperextended), show pt's mom how to order bean bag splint for night use    Burnard JINNY Roads, OT 06/05/2024, 2:07 PM

## 2024-06-05 NOTE — Therapy (Signed)
 OUTPATIENT PHYSICAL THERAPY NEURO EVALUATION   Patient Name: Paul Kane MRN: [id number] DOB:1986/04/01, 38 y.o., male Today's Date: 06/05/2024   PCP: Currie Rosalva Bruckner, PA-C REFERRING PROVIDER: Babs Arthea DASEN, MD  END OF SESSION:  PT End of Session - 06/05/24 1019     Visit Number 1    Authorization Type Aetna/Medicaid    PT Start Time 1017    PT Stop Time 1057    PT Time Calculation (min) 40 min    Activity Tolerance Patient tolerated treatment well    Behavior During Therapy WFL for tasks assessed/performed          Past Medical History:  Diagnosis Date   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HCC)    Chronic hepatitis B (HCC)    Fixed pupils    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HCC)    Hypertension    Intracranial injury of other and unspecified nature, without mention of open intracranial wound, unspecified state of consciousness    Intracranial shunt    Paralysis (HCC)    Spastic hemiplegia affecting dominant side (HCC)    Stroke (HCC)    Traumatic brain injury (HCC)    Trouble swallowing    PATIENT TAKES MEDICATIONS WITH THICKENED LIQUIDS   Visual disturbance    Weakness    Past Surgical History:  Procedure Laterality Date   cramiectomy     CRANIOPLASTY     EXCISION, MASS, UPPER EXTREMITY Left 05/18/2024   Procedure: EXCISION, MASS LEFT UPPER EXTREMITY;  Surgeon: Vernetta Berg, MD;  Location: [facility] OR;  Service: General;  Laterality: Left;  LEFT UPPER ARM MASS MAC   REMOVAL OF GASTROSTOMY TUBE     VENTRICULOPERITONEAL SHUNT     Patient Active Problem List   Diagnosis Date Noted   SIRS due to infectious process with acute organ dysfunction (HCC) 03/14/2021   SIRS (systemic inflammatory response syndrome) (HCC) 03/12/2021   Syncope 03/12/2021   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) 03/12/2021   Hypokalemia 03/12/2021   Lactic acidosis 03/12/2021   Mixed hyperlipidemia 04/25/2018   Seizure disorder (HCC) 04/11/2018    Mechanical low back pain 05/12/2017   Dysarthria 12/20/2015   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/20/2012   Deafness, sensorineural 04/20/2012   Buzzing in ear 04/20/2012   Leaking percutaneous endoscopic gastrostomy (PEG) tube (HCC) 02/04/2012   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 pre-existing conscious levels, sequela 10/12/2011   Cerebral infarct (HCC) 10/12/2011   Spastic hemiplegia of right dominant side due to noncerebrovascular etiology (HCC) 10/12/2011   Dysphagia 10/12/2011   Functioning G-tube 03/24/2011   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   Hemiplegia of dominant side (HCC) 07/28/2010   CEREBRAL HEMORRHAGE 07/28/2010   PERSONAL HISTORY OF TRAUMATIC BRAIN INJURY 07/02/2010   Allergic rhinitis 10/25/2008   GASTROENTERITIS 08/09/2008   CONTACT DERMATITIS&OTHER ECZEMA DUE DETERGENTS 08/18/2007   Chronic hepatitis B virus infection (HCC) 06/28/2007   Chronic viral hepatitis B without delta-agent (HCC) 06/28/2007    ONSET DATE: 04/26/2024 (referral)   REFERRING DIAG: G81.11 (ICD-10-CM) - Spastic hemiplegia of right dominant side due to noncerebrovascular etiology (HCC)  THERAPY DIAG:  Unsteadiness on feet  Other abnormalities of gait and mobility  Abnormal posture  Muscle weakness (generalized)  Rationale for Evaluation and Treatment: Rehabilitation  SUBJECTIVE:  SUBJECTIVE STATEMENT: Pt presents w/mother, [name]. Mother provides majority of subjective as pt is aphasic. Mother reports she is here because pt had a mass removed from his arm and recently had botox to his arm and leg. Received therapy at [facility] this year and got a new AFO. States he did not go to [facility] and does not know why this is in Dr.  Naaman note. Mother called pt's father to clarify that pt did not receive therapy at [facility].   Mother reports pt has had a few falls in the past 76mo, but this is baseline for pt. Is no longer going to pool therapy due to father's health concerns. Received new AFO recently in which he wears at all times when outside and wears some inside the house. States they work on stretching at home, but otherwise do not do much exercise. Pt unable to walk unless holding onto parent due to severe vision impairments.    Pt accompanied by: Mother, [name]    PERTINENT HISTORY:  History of traumatic brain injury with hydrocephalus, meningitis and right pontine stroke.   2. Spastic tetraplegia, dominant side more affected 3. severe oropharyngeal dysphagia--improved--tolerating diet well.  Continues on thickened liquids 4. Absence seizures 5. Mechanical Low back pain: due to posture/altered gait patterns--improved   PAIN:  Are you having pain? No  PRECAUTIONS: Fall  RED FLAGS: None   WEIGHT BEARING RESTRICTIONS: No  FALLS: Has patient fallen in last 6 months? Yes. Number of falls a few   LIVING ENVIRONMENT: Lives with: lives with their family Lives in: House/apartment Stairs: Yes: Internal: full flight  steps; on right going up, on left going up, and can reach both and External: 3 steps; on right going up, on left going up, and can reach both Has following equipment at home: Vannie - 2 wheeled, Wheelchair (manual), shower chair, and Grab bars  PLOF: Needs assistance with ADLs and Needs assistance with homemaking  PATIENT GOALS: to prevent decline   OBJECTIVE:  Note: Objective measures were completed at Evaluation unless otherwise noted.  DIAGNOSTIC FINDINGS: CT of head from 09/04/2022 IMPRESSION: 1. No acute intracranial abnormality. No calvarial fracture. 2. Stable left frontotemporal craniotomy with extensive encephalomalacia involving the subjacent left frontal and  temporal lobes. Stable encephalomalacia within the inferior right frontal lobe. 3. Stable right frontal ventriculostomy with its tip position within the third ventricle. Stable ex vacuo dilation of the lateral ventricles. 4. No acute fracture or listhesis of the cervical spine.    COGNITION: Overall cognitive status: History of cognitive impairments - at baseline   SENSATION: Pt denies numbness/tingling    MUSCLE TONE: RLE: Rigidity    POSTURE: rounded shoulders, forward head, and weight shift right  LOWER EXTREMITY ROM:     Active  Right Eval Left Eval  Hip flexion    Hip extension    Hip abduction    Hip adduction    Hip internal rotation    Hip external rotation    Knee flexion    Knee extension    Ankle dorsiflexion    Ankle plantarflexion    Ankle inversion    Ankle eversion     (Blank rows = not tested)  LOWER EXTREMITY MMT:    MMT Right Eval Left Eval  Hip flexion    Hip extension    Hip abduction    Hip adduction    Hip internal rotation    Hip external rotation    Knee flexion  Knee extension    Ankle dorsiflexion    Ankle plantarflexion    Ankle inversion    Ankle eversion    (Blank rows = not tested)  BED MOBILITY:  Findings: Mother reports she assists him in getting in and out of the bed as well as using urinal in the bed   TRANSFERS: Sit to stand: SBA  Assistive device utilized: None     Stand to sit: SBA  Assistive device utilized: None      RAMP:  Not tested  CURB:  Not tested  STAIRS: Not tested GAIT: Gait pattern: step through pattern, decreased arm swing- Right, decreased arm swing- Left, decreased hip/knee flexion- Right, decreased ankle dorsiflexion- Right, circumduction- Right, lateral hip instability, decreased trunk rotation, and wide BOS Distance walked: Various short clinic distances  Assistive device utilized: R AFO Level of assistance: CGA Comments: Pt requires CGA due to vision impairments. Pt easily  distractible, requiring max redirection to ambulate 74m through clinic.    FUNCTIONAL TESTS:   [facility] PT Assessment - 06/05/24 1047       Ambulation/Gait   Gait velocity 32.8' over 22.88s = 1.43 ft/s   no AD, CGA                                                                                                                                     TREATMENT DATE:  Self-care/home management  Lengthy discussion regarding importance of pt working on exercises at home and pt largely being at baseline function. Mother reports pt's father is no longer taking him to do pool therapy due to cancer diagnosis and mother is unable to do the exercises with pt at home due to her age. Per review of previous therapy notes and gait, pt is largely as baseline function and due to vision impairments, will be unable to safely ambulate without assistance. Mother repeatedly stating that pt was ambulating 3-5 miles at the [facility] 5 years ago and this is his baseline, so educated mother on how this is not a realistic goal nor is this his baseline due to vision changes and impaired attention. Pt's ambulation has been largely unchanged in past 2 years despite numerous bouts of PT.  Provided mother w/information on [facility] and informed her that pt will require care for entirety of his life and will benefit from a caregiver or ALF for more consistent care. Mother reports she cannot take care of pt due to her age, but also states she does not trust anyone else to care for pt. Mother inquiring about HHPT and informed her she can try it but it will not provide long term solution. Mother requesting pt be seen here for maintenance therapy, but informed mother that this is not a functional goal.    PATIENT EDUCATION: Education details: See self-care above  Person educated: Patient and Parent Education method: Explanation, Demonstration, and Handouts Education comprehension: verbalized understanding  and needs further  education  HOME EXERCISE PROGRAM: From Previous POC: Access Code: [email or web address]   GOALS: N/A   ASSESSMENT:  CLINICAL IMPRESSION: Patient is a 38 year old male referred to Neuro [facility] for spastic hemiplegia. Pt's PMH is significant for: TBI, hydrocephalus, meningitis and R pontine CVA. Pt wearing new R AFO and recently received botox in RUE and RLE. Per mother, pt is no longer going to [employer] as father has had medical decline and pt is not performing HEP at home regularly. Mother requesting pt be seen for maintenance therapy, but informed mother that this is not skilled therapy and pt has had no change from baseline in 2 years. Pt also recently received PT from [facility]. Pt's gait speed is maintained as well as his gait kinematics from previous bouts of PT. Recommended mother look into day program or caregiver for pt as she reports caring for pt is becoming difficult due to her age and due to pt's vision impairments and medical history, pt will require 24/7 Supervision. Pt does not require skilled PT services at this time as he is largely at baseline function.    OBJECTIVE IMPAIRMENTS: Abnormal gait, decreased activity tolerance, decreased balance, decreased cognition, decreased coordination, decreased endurance, decreased knowledge of condition, decreased knowledge of use of DME, decreased mobility, difficulty walking, decreased ROM, decreased strength, decreased safety awareness, increased muscle spasms, impaired flexibility, impaired sensation, impaired tone, impaired UE functional use, impaired vision/preception, and improper body mechanics   REHAB POTENTIAL: Poor due to chronicity and severity of deficits   CLINICAL DECISION MAKING: Evolving/moderate complexity  EVALUATION COMPLEXITY: High  PLAN:  PT FREQUENCY: one time visit   Jasmeet Gehl E Marguita Venning, PT, DPT 06/05/2024, 11:07 AM

## 2024-06-13 ENCOUNTER — Ambulatory Visit: Admitting: Occupational Therapy

## 2024-06-13 ENCOUNTER — Encounter: Payer: Self-pay | Admitting: Occupational Therapy

## 2024-06-13 DIAGNOSIS — R2681 Unsteadiness on feet: Secondary | ICD-10-CM

## 2024-06-13 DIAGNOSIS — R293 Abnormal posture: Secondary | ICD-10-CM

## 2024-06-13 DIAGNOSIS — I69351 Hemiplegia and hemiparesis following cerebral infarction affecting right dominant side: Secondary | ICD-10-CM

## 2024-06-13 NOTE — Therapy (Signed)
 OUTPATIENT OCCUPATIONAL THERAPY NEURO TREATMENT  Patient Name: Paul Kane MRN: [id number] DOB:1986/06/03, 38 y.o., male Today's Date: 06/13/2024  PCP: Darilyn Rosalva Bruckner, PA-C REFERRING PROVIDER: Babs Arthea DASEN, MD  END OF SESSION:  OT End of Session - 06/13/24 1251     Visit Number 2    Number of Visits 4    Date for Recertification  07/05/24    Authorization Type Aetna/MCD    OT Start Time 1242   Pt arrived late   OT Stop Time 1315    OT Time Calculation (min) 33 min    Activity Tolerance Patient tolerated treatment well    Behavior During Therapy WFL for tasks assessed/performed           Past Medical History:  Diagnosis Date   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HCC)    Chronic hepatitis B (HCC)    Fixed pupils    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HCC)    Hypertension    Intracranial injury of other and unspecified nature, without mention of open intracranial wound, unspecified state of consciousness    Intracranial shunt    Paralysis (HCC)    Spastic hemiplegia affecting dominant side (HCC)    Stroke (HCC)    Traumatic brain injury (HCC)    Trouble swallowing    PATIENT TAKES MEDICATIONS WITH THICKENED LIQUIDS   Visual disturbance    Weakness    Past Surgical History:  Procedure Laterality Date   cramiectomy     CRANIOPLASTY     EXCISION, MASS, UPPER EXTREMITY Left 05/18/2024   Procedure: EXCISION, MASS LEFT UPPER EXTREMITY;  Surgeon: Vernetta Berg, MD;  Location: [facility] OR;  Service: General;  Laterality: Left;  LEFT UPPER ARM MASS MAC   REMOVAL OF GASTROSTOMY TUBE     VENTRICULOPERITONEAL SHUNT     Patient Active Problem List   Diagnosis Date Noted   SIRS due to infectious process with acute organ dysfunction (HCC) 03/14/2021   SIRS (systemic inflammatory response syndrome) (HCC) 03/12/2021   Syncope 03/12/2021   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) 03/12/2021   Hypokalemia 03/12/2021   Lactic acidosis 03/12/2021    Mixed hyperlipidemia 04/25/2018   Seizure disorder (HCC) 04/11/2018   Mechanical low back pain 05/12/2017   Dysarthria 12/20/2015   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/20/2012   Deafness, sensorineural 04/20/2012   Buzzing in ear 04/20/2012   Leaking percutaneous endoscopic gastrostomy (PEG) tube (HCC) 02/04/2012   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 pre-existing conscious levels, sequela 10/12/2011   Cerebral infarct (HCC) 10/12/2011   Spastic hemiplegia of right dominant side due to noncerebrovascular etiology (HCC) 10/12/2011   Dysphagia 10/12/2011   Functioning G-tube 03/24/2011   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   Hemiplegia of dominant side (HCC) 07/28/2010   CEREBRAL HEMORRHAGE 07/28/2010   PERSONAL HISTORY OF TRAUMATIC BRAIN INJURY 07/02/2010   Allergic rhinitis 10/25/2008   GASTROENTERITIS 08/09/2008   CONTACT DERMATITIS&OTHER ECZEMA DUE DETERGENTS 08/18/2007   Chronic hepatitis B virus infection (HCC) 06/28/2007   Chronic viral hepatitis B without delta-agent (HCC) 06/28/2007    ONSET DATE: 04/26/2024 (referral date)   REFERRING DIAG: G81.11 (ICD-10-CM) - Spastic hemiplegia of right dominant side due to noncerebrovascular etiology (HCC)  DX: TBI with spasticity right hemiparesis s/p botox to elbow and finger flexors.  RX: Eval and treat, rom, spasticity mgt, improve functional use of RUE.  THERAPY DIAG:  Hemiplegia and hemiparesis following cerebral infarction affecting right dominant side (HCC)  Abnormal  posture  Unsteadiness on feet  Rationale for Evaluation and Treatment: Rehabilitation  SUBJECTIVE:   SUBJECTIVE STATEMENT: Pt mostly nonverbal - can say a few words. Father accompanies him today. They arrived late for today's session Pt accompanied by: father  PERTINENT HISTORY: previously received OT services 1. History of traumatic brain injury with hydrocephalus, meningitis,  and right pontine stroke  (2011) 2. Spastic tetraplegia, dominant side more affected  PRECAUTIONS: Fall and Other: h/o seizures  WEIGHT BEARING RESTRICTIONS: No  PAIN:  Are you having pain? Unable to verbalize but does not appear in any pain  FALLS: Has patient fallen in last 6 months? Yes. Number of falls 4-5 (avg 1-2 month)  LIVING ENVIRONMENT: Lives with: lives with their family Lives in: House Stairs: Yes: Internal: full flight to 2nd floor, but bedroom and bathroom on main floor and does not need to go up steps; can reach both and External: 3 steps; can reach both Has following equipment at home: shower chair and Grab bars   PLOF: Needs assistance with ADLs  PATIENT GOALS: splint  OBJECTIVE:  Note: Objective measures were completed at Evaluation unless otherwise noted.  HAND DOMINANCE: Rt handed prior to 2011, but now uses Lt hand  ADLs: Overall ADLs: Pt is mostly able to feed himself (pureed foods) w/ Lt hand, brush teeth w/ electric toothbrush, mod I w/ shirt (per mom report), max assist w/ LE dressing, and assist w/ bathing (mom gets in shower w/ patient) Transfers/ambulation related to ADLs: requires hand held assist to be safe/prevent falls  Equipment: Shower seat with back and Grab bars for shower  IADLs: dependent  MOBILITY STATUS: Needs Assist: hand held assist to be safe/prevent falls  POSTURE COMMENTS:  rounded shoulders Sitting balance: can sit without UE support    UPPER EXTREMITY ROM:  LUE AROM WNL's RUE limited by spasticity, with shoulder movement to approx midrange, some elbow movement but limited in extension due to tone, no functional use of hand.  Passive supination limited to neutral and passive MP extension difficult in hand   HAND FUNCTION: No functional use Rt hand however mom reports he does use as a stabilizer for some tasks (holding jacket against body w/ Rt hand/arm while zipping Lt hand)  COORDINATION: Pt has gross grasp, limited active  extension  SENSATION: Not tested   EDEMA: none  MUSCLE TONE: RUE: Moderate and Hypertonic  COGNITION: Overall cognitive status: History of cognitive impairments - at baseline;  mother present providing majority of information. Pt can follow simple instructions w/ demo  VISION: not formally assessed d/t cognitive status however vision is impaired from stroke and may be contributing to falls    PERCEPTION: Not tested  PRAXIS: Not tested  OBSERVATIONS: Pt would benefit from brief O.T. to address splinting needs and update HEP for RUE following botox, however no changes from when last seen by O.T. (pt has been longstanding pt of [facility] [place] outpatient services as well as other other private outpatient centers)  TREATMENT DATE: 06/13/24    Fabricated and fitted resting hand splint w/ MP's in greater extension and some PIP flexion. Also strap for thumb to prevent IP hyperextension. Pt/father instructed to wear resting hand splint during the day gradually increasing wearing time. Pt/father shown how to don/doff and reviewed wear and care.   Pt/father issued handout on Posey elbow splint for pm night wear  PATIENT EDUCATION: Education details: see above Person educated: Pt's father Education method: Medical Illustrator Education comprehension: verbalized understanding and returned demonstration  HOME EXERCISE PROGRAM: 06/13/24: splint wear and care   GOALS: Goals reviewed with patient? Yes   LONG TERM GOALS: Target date: 07/05/24  Pt/family independent with RUE stretching HEP  Baseline:  Goal status: INITIAL  2.  Pt/family independent with splint wear and care (resting hand splint for day, bean bag splint for elbow at night) Baseline:  Goal status: IN PROGRESS  3.  Pt/family to verbalize understanding with A/E for shoes (shoe horn,  adapted shoe lace options)  Baseline:  Goal status: INITIAL   ASSESSMENT:  CLINICAL IMPRESSION: Patient seen today for occupational therapy treatment for splinting secondary to chronic spastic hemiplegia Rt dominant side from TBI and stroke in 2011. Pt w/ recent botox injections to Rt hand - FDS, FDP, and lumbricals. Pt is familiar to this clinic and other [facility] outpatient settings as well. Father present today. Pt may benefit from aide coming to home to provide assist w/ ADLs and stretching program. Pt would benefit from short duration of skilled O.T. to address splinting needs, update stretching HEP for RUE following botox and further A/E exploration  PERFORMANCE DEFICITS: in functional skills including ADLs, IADLs, tone, ROM, decreased knowledge of use of DME, and UE functional use,.   IMPAIRMENTS: are limiting patient from ADLs.   CO-MORBIDITIES: may have co-morbidities  that affects occupational performance. Patient will benefit from skilled OT to address above impairments and improve overall function.  MODIFICATION OR ASSISTANCE TO COMPLETE EVALUATION: Min-Moderate modification of tasks or assist with assess necessary to complete an evaluation.  OT OCCUPATIONAL PROFILE AND HISTORY: Detailed assessment: Review of records and additional review of physical, cognitive, psychosocial history related to current functional performance.  CLINICAL DECISION MAKING: Moderate - several treatment options, min-mod task modification necessary  REHAB POTENTIAL: Fair time since onset and severity of deficits, however good potential for goals   EVALUATION COMPLEXITY: Moderate    PLAN:  OT FREQUENCY: 1x/week  OT DURATION: 4 weeks  PLANNED INTERVENTIONS: 97535 self care/ADL training, 02889 therapeutic exercise, 97530 therapeutic activity, 97112 neuromuscular re-education, 97010 moist heat, 97760 Orthotic Initial, 97763 Orthotic/Prosthetic subsequent, passive range of motion,  visual/perceptual remediation/compensation, patient/family education, and DME and/or AE instructions  RECOMMENDED OTHER SERVICES: none at this time  CONSULTED AND AGREED WITH PLAN OF CARE: Patient and family member/caregiver  PLAN FOR NEXT SESSION: splint check and adjustments prn, progress towards other goals (try clip on shoe horn)    Burnard JINNY Roads, OT 06/13/2024, 12:51 PM

## 2024-06-19 ENCOUNTER — Telehealth: Payer: Self-pay | Admitting: Physical Medicine & Rehabilitation

## 2024-06-19 DIAGNOSIS — S062X5S 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 pre-existing conscious levels, sequela: Secondary | ICD-10-CM

## 2024-06-19 DIAGNOSIS — G8111 Spastic hemiplegia affecting right dominant side: Secondary | ICD-10-CM

## 2024-06-19 NOTE — Telephone Encounter (Signed)
 [facility] call requesting a referral for this patient they have already treated them but they need the referral faxed over. DX: Right side strengthen and balance FAX:[phone]

## 2024-06-20 ENCOUNTER — Ambulatory Visit: Admitting: Occupational Therapy

## 2024-06-20 DIAGNOSIS — M6281 Muscle weakness (generalized): Secondary | ICD-10-CM | POA: Insufficient documentation

## 2024-06-20 DIAGNOSIS — R29818 Other symptoms and signs involving the nervous system: Secondary | ICD-10-CM | POA: Insufficient documentation

## 2024-06-20 DIAGNOSIS — R278 Other lack of coordination: Secondary | ICD-10-CM | POA: Diagnosis present

## 2024-06-20 DIAGNOSIS — I69351 Hemiplegia and hemiparesis following cerebral infarction affecting right dominant side: Secondary | ICD-10-CM | POA: Insufficient documentation

## 2024-06-20 DIAGNOSIS — R2681 Unsteadiness on feet: Secondary | ICD-10-CM | POA: Insufficient documentation

## 2024-06-20 DIAGNOSIS — R293 Abnormal posture: Secondary | ICD-10-CM | POA: Insufficient documentation

## 2024-06-20 NOTE — Therapy (Signed)
 OUTPATIENT OCCUPATIONAL THERAPY NEURO TREATMENT  Patient Name: Paul Kane MRN: [id number] DOB:1985-10-13, 38 y.o., male Today's Date: 06/20/2024  PCP: Darilyn Rosalva Bruckner, PA-C REFERRING PROVIDER: Babs Arthea DASEN, MD  END OF SESSION:  OT End of Session - 06/20/24 1137     Visit Number 3    Number of Visits 4    Date for Recertification  07/05/24    Authorization Type Aetna/MCD    OT Start Time 1016    OT Stop Time 1100    OT Time Calculation (min) 44 min    Activity Tolerance Patient tolerated treatment well    Behavior During Therapy WFL for tasks assessed/performed            Past Medical History:  Diagnosis Date   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HCC)    Chronic hepatitis B (HCC)    Fixed pupils    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HCC)    Hypertension    Intracranial injury of other and unspecified nature, without mention of open intracranial wound, unspecified state of consciousness    Intracranial shunt    Paralysis (HCC)    Spastic hemiplegia affecting dominant side (HCC)    Stroke (HCC)    Traumatic brain injury (HCC)    Trouble swallowing    PATIENT TAKES MEDICATIONS WITH THICKENED LIQUIDS   Visual disturbance    Weakness    Past Surgical History:  Procedure Laterality Date   cramiectomy     CRANIOPLASTY     EXCISION, MASS, UPPER EXTREMITY Left 05/18/2024   Procedure: EXCISION, MASS LEFT UPPER EXTREMITY;  Surgeon: Vernetta Berg, MD;  Location: [facility] OR;  Service: General;  Laterality: Left;  LEFT UPPER ARM MASS MAC   REMOVAL OF GASTROSTOMY TUBE     VENTRICULOPERITONEAL SHUNT     Patient Active Problem List   Diagnosis Date Noted   SIRS due to infectious process with acute organ dysfunction (HCC) 03/14/2021   SIRS (systemic inflammatory response syndrome) (HCC) 03/12/2021   Syncope 03/12/2021   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) 03/12/2021   Hypokalemia 03/12/2021   Lactic acidosis 03/12/2021   Mixed  hyperlipidemia 04/25/2018   Seizure disorder (HCC) 04/11/2018   Mechanical low back pain 05/12/2017   Dysarthria 12/20/2015   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/20/2012   Deafness, sensorineural 04/20/2012   Buzzing in ear 04/20/2012   Leaking percutaneous endoscopic gastrostomy (PEG) tube (HCC) 02/04/2012   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 pre-existing conscious levels, sequela 10/12/2011   Cerebral infarct (HCC) 10/12/2011   Spastic hemiplegia of right dominant side due to noncerebrovascular etiology (HCC) 10/12/2011   Dysphagia 10/12/2011   Functioning G-tube 03/24/2011   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   Hemiplegia of dominant side (HCC) 07/28/2010   CEREBRAL HEMORRHAGE 07/28/2010   PERSONAL HISTORY OF TRAUMATIC BRAIN INJURY 07/02/2010   Allergic rhinitis 10/25/2008   GASTROENTERITIS 08/09/2008   CONTACT DERMATITIS&OTHER ECZEMA DUE DETERGENTS 08/18/2007   Chronic hepatitis B virus infection (HCC) 06/28/2007   Chronic viral hepatitis B without delta-agent (HCC) 06/28/2007    ONSET DATE: 04/26/2024 (referral date)   REFERRING DIAG: G81.11 (ICD-10-CM) - Spastic hemiplegia of right dominant side due to noncerebrovascular etiology (HCC)  DX: TBI with spasticity right hemiparesis s/p botox to elbow and finger flexors.  RX: Eval and treat, rom, spasticity mgt, improve functional use of RUE.  THERAPY DIAG:  Hemiplegia and hemiparesis following cerebral infarction affecting right dominant side (HCC)  Abnormal posture  Muscle  weakness (generalized)  Other lack of coordination  Other symptoms and signs involving the nervous system  Rationale for Evaluation and Treatment: Rehabilitation  SUBJECTIVE:   SUBJECTIVE STATEMENT: Pt mostly nonverbal - can say a few words. Father accompanies him today and assists with communication. No s/s of pain from the pt. Pt accompanied by: father  PERTINENT HISTORY: previously  received OT services 1. History of traumatic brain injury with hydrocephalus, meningitis,  and right pontine stroke (2011) 2. Spastic tetraplegia, dominant side more affected  PRECAUTIONS: Fall and Other: h/o seizures  WEIGHT BEARING RESTRICTIONS: No  PAIN:  Are you having pain? Unable to verbalize but does not appear in any pain  FALLS: Has patient fallen in last 6 months? Yes. Number of falls 4-5 (avg 1-2 month)  LIVING ENVIRONMENT: Lives with: lives with their family Lives in: House Stairs: Yes: Internal: full flight to 2nd floor, but bedroom and bathroom on main floor and does not need to go up steps; can reach both and External: 3 steps; can reach both Has following equipment at home: shower chair and Grab bars   PLOF: Needs assistance with ADLs  PATIENT GOALS: splint  OBJECTIVE:  Note: Objective measures were completed at Evaluation unless otherwise noted.  HAND DOMINANCE: Rt handed prior to 2011, but now uses Lt hand  ADLs: Overall ADLs: Pt is mostly able to feed himself (pureed foods) w/ Lt hand, brush teeth w/ electric toothbrush, mod I w/ shirt (per mom report), max assist w/ LE dressing, and assist w/ bathing (mom gets in shower w/ patient) Transfers/ambulation related to ADLs: requires hand held assist to be safe/prevent falls  Equipment: Shower seat with back and Grab bars for shower  IADLs: dependent  MOBILITY STATUS: Needs Assist: hand held assist to be safe/prevent falls  POSTURE COMMENTS:  rounded shoulders Sitting balance: can sit without UE support    UPPER EXTREMITY ROM:  LUE AROM WNL's RUE limited by spasticity, with shoulder movement to approx midrange, some elbow movement but limited in extension due to tone, no functional use of hand.  Passive supination limited to neutral and passive MP extension difficult in hand   HAND FUNCTION: No functional use Rt hand however mom reports he does use as a stabilizer for some tasks (holding jacket against  body w/ Rt hand/arm while zipping Lt hand)  COORDINATION: Pt has gross grasp, limited active extension  SENSATION: Not tested   EDEMA: none  MUSCLE TONE: RUE: Moderate and Hypertonic  COGNITION: Overall cognitive status: History of cognitive impairments - at baseline;  mother present providing majority of information. Pt can follow simple instructions w/ demo  VISION: not formally assessed d/t cognitive status however vision is impaired from stroke and may be contributing to falls    PERCEPTION: Not tested  PRAXIS: Not tested  OBSERVATIONS: Pt would benefit from brief O.T. to address splinting needs and update HEP for RUE following botox, however no changes from when last seen by O.T. (pt has been longstanding pt of [facility] outpatient services as well as other other private outpatient centers)  TREATMENT DATE: 06/20/24   -Pt present with father this date, focus of session on HEP review for RUE ROM/PROM with stretches to decrease abnormal tone, prevent further contracture development, and maintain ability to wear splint/perform hygiene/etc.  -OT performing PROM on the pt's RUE seated position (mat unavailable for supine) beginning proximal and working distally for shoulder flexion/extension, horizontal abd, ER/IR, elbow extension, forearm sup/pro, wrist extension, and all digits focusing on MCPs, static hold when appropriate. OT providing soft tissue massage as well along bicep insertion and tight pectoralis, having the pt's father feel as well. Encouraged pt's father to perform ROM but stating he is comfortable doing this already and has been doing it for the pt.     PATIENT EDUCATION: Education details: see above Person educated: Pt's father Education method: Medical Illustrator Education comprehension: verbalized understanding and returned  demonstration  HOME EXERCISE PROGRAM: 06/13/24: splint wear and care 06/20/24: reviewed caregiver ROM. OT demonstrated and pt's father verbalizing understanding - politely declined hands on.   GOALS: Goals reviewed with patient? Yes   LONG TERM GOALS: Target date: 07/05/24  Pt/family independent with RUE stretching HEP  Baseline:  Goal status: INITIAL  2.  Pt/family independent with splint wear and care (resting hand splint for day, bean bag splint for elbow at night) Baseline:  Goal status: IN PROGRESS  3.  Pt/family to verbalize understanding with A/E for shoes (shoe horn, adapted shoe lace options)  Baseline:  Goal status: INITIAL   ASSESSMENT:  CLINICAL IMPRESSION: Patient seen today for occupational therapy treatment for follow up after splinting and establishing ROM HEP secondary to chronic spastic hemiplegia Rt dominant side from TBI and stroke in 2011. Pt w/ recent botox injections to Rt hand - FDS, FDP, and lumbricals. Pt is familiar to this clinic and other [facility] outpatient settings as well. Father present today. Pt may benefit from aide coming to home to provide assist w/ ADLs and stretching program. Pt would benefit from short duration of skilled O.T. to address splinting needs, update stretching HEP for RUE following botox and further A/E exploration  PERFORMANCE DEFICITS: in functional skills including ADLs, IADLs, tone, ROM, decreased knowledge of use of DME, and UE functional use,.   IMPAIRMENTS: are limiting patient from ADLs.   CO-MORBIDITIES: may have co-morbidities  that affects occupational performance. Patient will benefit from skilled OT to address above impairments and improve overall function.  MODIFICATION OR ASSISTANCE TO COMPLETE EVALUATION: Min-Moderate modification of tasks or assist with assess necessary to complete an evaluation.  OT OCCUPATIONAL PROFILE AND HISTORY: Detailed assessment: Review of records and additional review of physical,  cognitive, psychosocial history related to current functional performance.  CLINICAL DECISION MAKING: Moderate - several treatment options, min-mod task modification necessary  REHAB POTENTIAL: Fair time since onset and severity of deficits, however good potential for goals   EVALUATION COMPLEXITY: Moderate    PLAN:  OT FREQUENCY: 1x/week  OT DURATION: 4 weeks  PLANNED INTERVENTIONS: 97535 self care/ADL training, 02889 therapeutic exercise, 97530 therapeutic activity, 97112 neuromuscular re-education, 97010 moist heat, 97760 Orthotic Initial, 97763 Orthotic/Prosthetic subsequent, passive range of motion, visual/perceptual remediation/compensation, patient/family education, and DME and/or AE instructions  RECOMMENDED OTHER SERVICES: none at this time  CONSULTED AND AGREED WITH PLAN OF CARE: Patient and family member/caregiver  PLAN FOR NEXT SESSION: splint check and adjustments prn, progress towards other goals (try clip on shoe horn)    Chiquita JAYSON Hopping, OT 06/20/2024, 11:39 AM

## 2024-06-20 NOTE — Patient Instructions (Signed)
 CAREGIVER ASSISTED: [employer]    Place arm on table. Caregiver bends fingers into fist. Patient looks at hand. Hold 5 seconds. 10 reps per set,  2 sets per day.      CAREGIVER ASSISTED: Elbow Flexion    Caregiver bends and straightens elbow. Patient looks at arm. Hold 5 seconds. 10 reps per set, 2 sets per day.   Flexion: Eccentric ROM (Supine)    Position (A) Helper: Lift left arm to point to ceiling, palm turned inward. Motion (B) - Cue patient to lower arm slowly to side. -Helper supports arm throughout to control movement. Repeat 10 times. Repeat with other arm. Do 2 sessions per day. Variation: Begin with arm only partially lifted.

## 2024-06-20 NOTE — Telephone Encounter (Signed)
 Therapy order is in

## 2024-06-27 ENCOUNTER — Ambulatory Visit: Admitting: Occupational Therapy

## 2024-07-04 ENCOUNTER — Ambulatory Visit: Admitting: Occupational Therapy

## 2024-07-04 DIAGNOSIS — R29818 Other symptoms and signs involving the nervous system: Secondary | ICD-10-CM

## 2024-07-04 DIAGNOSIS — R2681 Unsteadiness on feet: Secondary | ICD-10-CM

## 2024-07-04 DIAGNOSIS — I69351 Hemiplegia and hemiparesis following cerebral infarction affecting right dominant side: Secondary | ICD-10-CM

## 2024-07-04 DIAGNOSIS — R278 Other lack of coordination: Secondary | ICD-10-CM

## 2024-07-04 NOTE — Patient Instructions (Signed)
 Flexion (Assistive)    Hold Rt wrist with Lt hand and raise arms above head, keeping elbows as straight as possible. Can be done sitting or lying. Repeat _10___ times. Do _3___ sessions per day.   Supination (Passive)    Keep elbow bent at right angle and held firmly at side. Use other hand to turn forearm until palm faces upward. Hold _20___ seconds. Repeat __5__ times. Do __3__ sessions per day.

## 2024-07-04 NOTE — Therapy (Signed)
 OUTPATIENT OCCUPATIONAL THERAPY NEURO TREATMENT/DISCHARGE  Patient Name: BURK HOCTOR MRN: [id number] DOB:1985-07-30, 38 y.o., male Today's Date: 07/04/2024  PCP: Podraza, Cole Christopher, PA-C REFERRING PROVIDER: Babs Arthea DASEN, MD   OCCUPATIONAL THERAPY DISCHARGE SUMMARY  Visits from Start of Care: 4  Current functional level related to goals / functional outcomes: Pt/family have met all goals   Remaining deficits: Same as initial evaluation   Education / Equipment: HEP, splint wear and care, A/E recommendations   Patient agrees to discharge. Patient goals were met. Patient is being discharged due to meeting the stated rehab goals.     END OF SESSION:  OT End of Session - 07/04/24 1421     Visit Number 4    Number of Visits 4    Date for Recertification  07/05/24    Authorization Type Aetna/MCD    OT Start Time 1420    OT Stop Time 1500    OT Time Calculation (min) 40 min    Activity Tolerance Patient tolerated treatment well    Behavior During Therapy WFL for tasks assessed/performed            Past Medical History:  Diagnosis Date   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HCC)    Chronic hepatitis B (HCC)    Fixed pupils    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HCC)    Hypertension    Intracranial injury of other and unspecified nature, without mention of open intracranial wound, unspecified state of consciousness    Intracranial shunt    Paralysis (HCC)    Spastic hemiplegia affecting dominant side (HCC)    Stroke (HCC)    Traumatic brain injury (HCC)    Trouble swallowing    PATIENT TAKES MEDICATIONS WITH THICKENED LIQUIDS   Visual disturbance    Weakness    Past Surgical History:  Procedure Laterality Date   cramiectomy     CRANIOPLASTY     EXCISION, MASS, UPPER EXTREMITY Left 05/18/2024   Procedure: EXCISION, MASS LEFT UPPER EXTREMITY;  Surgeon: Vernetta Berg, MD;  Location: [facility] OR;  Service: General;  Laterality: Left;  LEFT UPPER ARM  MASS MAC   REMOVAL OF GASTROSTOMY TUBE     VENTRICULOPERITONEAL SHUNT     Patient Active Problem List   Diagnosis Date Noted   SIRS due to infectious process with acute organ dysfunction (HCC) 03/14/2021   SIRS (systemic inflammatory response syndrome) (HCC) 03/12/2021   Syncope 03/12/2021   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) 03/12/2021   Hypokalemia 03/12/2021   Lactic acidosis 03/12/2021   Mixed hyperlipidemia 04/25/2018   Seizure disorder (HCC) 04/11/2018   Mechanical low back pain 05/12/2017   Dysarthria 12/20/2015   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/20/2012   Deafness, sensorineural 04/20/2012   Buzzing in ear 04/20/2012   Leaking percutaneous endoscopic gastrostomy (PEG) tube (HCC) 02/04/2012   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 pre-existing conscious levels, sequela 10/12/2011   Cerebral infarct (HCC) 10/12/2011   Spastic hemiplegia of right dominant side due to noncerebrovascular etiology (HCC) 10/12/2011   Dysphagia 10/12/2011   Functioning G-tube 03/24/2011   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   Hemiplegia of dominant side (HCC) 07/28/2010   CEREBRAL HEMORRHAGE 07/28/2010   PERSONAL HISTORY OF TRAUMATIC BRAIN INJURY 07/02/2010   Allergic rhinitis 10/25/2008   GASTROENTERITIS 08/09/2008   CONTACT DERMATITIS&OTHER ECZEMA DUE DETERGENTS 08/18/2007   Chronic hepatitis B virus infection (HCC) 06/28/2007   Chronic viral hepatitis B without delta-agent (HCC) 06/28/2007  ONSET DATE: 04/26/2024 (referral date)   REFERRING DIAG: G81.11 (ICD-10-CM) - Spastic hemiplegia of right dominant side due to noncerebrovascular etiology (HCC)  DX: TBI with spasticity right hemiparesis s/p botox to elbow and finger flexors.  RX: Eval and treat, rom, spasticity mgt, improve functional use of RUE.  THERAPY DIAG:  Hemiplegia and hemiparesis following cerebral infarction affecting right dominant side  (HCC)  Other lack of coordination  Other symptoms and signs involving the nervous system  Unsteadiness on feet  Rationale for Evaluation and Treatment: Rehabilitation  SUBJECTIVE:   SUBJECTIVE STATEMENT: Pt mostly nonverbal - can say a few words. Mother accompanies him today and assists with communication. No s/s of pain from the pt. Pt accompanied by: father  PERTINENT HISTORY: previously received OT services 1. History of traumatic brain injury with hydrocephalus, meningitis,  and right pontine stroke (2011) 2. Spastic tetraplegia, dominant side more affected  PRECAUTIONS: Fall and Other: h/o seizures  WEIGHT BEARING RESTRICTIONS: No  PAIN:  Are you having pain? Unable to verbalize but does not appear in any pain  FALLS: Has patient fallen in last 6 months? Yes. Number of falls 4-5 (avg 1-2 month)  LIVING ENVIRONMENT: Lives with: lives with their family Lives in: House Stairs: Yes: Internal: full flight to 2nd floor, but bedroom and bathroom on main floor and does not need to go up steps; can reach both and External: 3 steps; can reach both Has following equipment at home: shower chair and Grab bars   PLOF: Needs assistance with ADLs  PATIENT GOALS: splint  OBJECTIVE:  Note: Objective measures were completed at Evaluation unless otherwise noted.  HAND DOMINANCE: Rt handed prior to 2011, but now uses Lt hand  ADLs: Overall ADLs: Pt is mostly able to feed himself (pureed foods) w/ Lt hand, brush teeth w/ electric toothbrush, mod I w/ shirt (per mom report), max assist w/ LE dressing, and assist w/ bathing (mom gets in shower w/ patient) Transfers/ambulation related to ADLs: requires hand held assist to be safe/prevent falls  Equipment: Shower seat with back and Grab bars for shower  IADLs: dependent  MOBILITY STATUS: Needs Assist: hand held assist to be safe/prevent falls  POSTURE COMMENTS:  rounded shoulders Sitting balance: can sit without UE  support    UPPER EXTREMITY ROM:  LUE AROM WNL's RUE limited by spasticity, with shoulder movement to approx midrange, some elbow movement but limited in extension due to tone, no functional use of hand.  Passive supination limited to neutral and passive MP extension difficult in hand   HAND FUNCTION: No functional use Rt hand however mom reports he does use as a stabilizer for some tasks (holding jacket against body w/ Rt hand/arm while zipping Lt hand)  COORDINATION: Pt has gross grasp, limited active extension  SENSATION: Not tested   EDEMA: none  MUSCLE TONE: RUE: Moderate and Hypertonic  COGNITION: Overall cognitive status: History of cognitive impairments - at baseline;  mother present providing majority of information. Pt can follow simple instructions w/ demo  VISION: not formally assessed d/t cognitive status however vision is impaired from stroke and may be contributing to falls    PERCEPTION: Not tested  PRAXIS: Not tested  OBSERVATIONS: Pt would benefit from brief O.T. to address splinting needs and update HEP for RUE following botox, however no changes from when last seen by O.T. (pt has been longstanding pt of [facility] outpatient services as well as other other private outpatient centers)  TREATMENT DATE: 07/04/24   Reviewed HEP previously issued and provided 2 extra ex's today including: self P/ROM for shoulder flexion and forearm supination - see pt instructions for details. Pt/mother had no further questions  Pt/mother shown A/E options for shoes including: adapted shoelaces, shoe buttons, and clip on shoe horn - pt/mother provided with handouts and instructed where/how to purchase if interested.   Pt/mother provided with extra straps for resting hand splint and reviewed wear and care during the day. Pt's family to order posey splint  for elbow for night time    PATIENT EDUCATION: Education details: see above Person educated: Pt's mother Education method: Explanation, Demonstration, Verbal cues, and Handouts Education comprehension: verbalized understanding and returned demonstration  HOME EXERCISE PROGRAM: 06/13/24: splint wear and care 06/20/24: reviewed caregiver ROM. OT demonstrated and pt's father verbalizing understanding - politely declined hands on. 07/04/24: additional HEP, A/E recommendations   GOALS: Goals reviewed with patient? Yes   LONG TERM GOALS: Target date: 07/05/24  Pt/family independent with RUE stretching HEP  Baseline:  Goal status: MET   2.  Pt/family independent with splint wear and care (resting hand splint for day, bean bag splint for elbow at night) Baseline:  Goal status: MET   3.  Pt/family to verbalize understanding with A/E for shoes (shoe horn, adapted shoe lace options)  Baseline:  Goal status: MET   ASSESSMENT:  CLINICAL IMPRESSION: Patient/family has met all goals at this time. All questions/concerns were addressed  PERFORMANCE DEFICITS: in functional skills including ADLs, IADLs, tone, ROM, decreased knowledge of use of DME, and UE functional use,.   IMPAIRMENTS: are limiting patient from ADLs.   CO-MORBIDITIES: may have co-morbidities  that affects occupational performance. Patient will benefit from skilled OT to address above impairments and improve overall function.  MODIFICATION OR ASSISTANCE TO COMPLETE EVALUATION: Min-Moderate modification of tasks or assist with assess necessary to complete an evaluation.  OT OCCUPATIONAL PROFILE AND HISTORY: Detailed assessment: Review of records and additional review of physical, cognitive, psychosocial history related to current functional performance.  CLINICAL DECISION MAKING: Moderate - several treatment options, min-mod task modification necessary  REHAB POTENTIAL: Fair time since onset and severity of deficits,  however good potential for goals   EVALUATION COMPLEXITY: Moderate    PLAN:  OT FREQUENCY: 1x/week  OT DURATION: 4 weeks  PLANNED INTERVENTIONS: 97535 self care/ADL training, 02889 therapeutic exercise, 97530 therapeutic activity, 97112 neuromuscular re-education, 97010 moist heat, 97760 Orthotic Initial, 97763 Orthotic/Prosthetic subsequent, passive range of motion, visual/perceptual remediation/compensation, patient/family education, and DME and/or AE instructions  RECOMMENDED OTHER SERVICES: none at this time  CONSULTED AND AGREED WITH PLAN OF CARE: Patient and family member/caregiver  PLAN:  D/C O.T.   Burnard JINNY Roads, OT 07/04/2024, 2:58 PM

## 2024-08-02 ENCOUNTER — Encounter: Attending: Physical Medicine & Rehabilitation | Admitting: Physical Medicine & Rehabilitation

## 2024-08-02 ENCOUNTER — Encounter: Payer: Self-pay | Admitting: Physical Medicine & Rehabilitation

## 2024-08-02 VITALS — BP 119/74 | HR 67 | Ht 67.0 in | Wt 172.8 lb

## 2024-08-02 DIAGNOSIS — G8111 Spastic hemiplegia affecting right dominant side: Secondary | ICD-10-CM | POA: Diagnosis not present

## 2024-08-02 MED ORDER — INCOBOTULINUMTOXINA 100 UNITS IM SOLR
500.0000 [IU] | Freq: Once | INTRAMUSCULAR | Status: AC
  Administered 2024-08-02: 500 [IU] via INTRAMUSCULAR

## 2024-08-02 MED ORDER — SODIUM CHLORIDE (PF) 0.9 % IJ SOLN
5.0000 mL | Freq: Once | INTRAMUSCULAR | Status: AC
  Administered 2024-08-02: 5 mL

## 2024-08-02 NOTE — Patient Instructions (Addendum)
 ALWAYS FEEL FREE TO CALL OUR OFFICE WITH ANY PROBLEMS OR QUESTIONS [phone])  **PLEASE NOTE** ALL MEDICATION REFILL REQUESTS (INCLUDING CONTROLLED SUBSTANCES) NEED TO BE MADE AT LEAST 7 DAYS PRIOR TO REFILL BEING DUE. ANY REFILL REQUESTS INSIDE THAT TIME FRAME MAY RESULT IN DELAYS IN RECEIVING YOUR PRESCRIPTION.

## 2024-08-02 NOTE — Progress Notes (Signed)
 Xeomin  Injection for spasticity using needle EMG guidance Indication: Spastic hemiplegia of right dominant side due to noncerebrovascular etiology (HCC) - Plan: incobotulinumtoxinA  (XEOMIN ) 100 units injection 500 Units, sodium chloride  (PF) 0.9 % injection 5 mL, Ambulatory referral to Physical Therapy, Ambulatory referral to Occupational Therapy   Dilution: 100 Units/ml        Total Units Injected: 400 Indication: Severe spasticity which interferes with ADL,mobility and/or  hygiene and is unresponsive to medication management and other conservative care Informed consent was obtained after describing risks and benefits of the procedure with the patient. This includes bleeding, bruising, infection, excessive weakness, or medication side effects. A REMS form is on file and signed.  G81.11 Needle: 50mm injectable monopolar needle electrode  Number of units per muscle Pectoralis Major 0 units Pectoralis Minor 0 units Biceps 150 units Brachioradialis 50 units FCR 25 units FCU 25 units FDS 50 units FDP 50 units FPL 0 units Pronator Teres 50 units Pronator Quadratus 0 units Lumbricals 100 units Quadriceps 0 units Gastroc/soleus 0 units Hamstrings 0 units Tibialis Posterior 0 units Tibialis Anterior 0 units EHL 0 units All injections were done after obtaining appropriate EMG activity and after negative drawback for blood. The patient tolerated the procedure well. Post procedure instructions were given. Return in about 3 months (around 10/31/2024) for xeomin  500 u RUE, +/- RLE.

## 2024-11-01 ENCOUNTER — Encounter: Admitting: Physical Medicine & Rehabilitation
